# Patient Record
Sex: Female | Born: 1940
Health system: Southern US, Community
[De-identification: ages and names within clinical notes are randomized; demographics above are authoritative.]

## PROBLEM LIST (undated history)

## (undated) DIAGNOSIS — C801 Malignant (primary) neoplasm, unspecified: Secondary | ICD-10-CM

## (undated) DIAGNOSIS — I10 Essential (primary) hypertension: Secondary | ICD-10-CM

## (undated) DIAGNOSIS — I341 Nonrheumatic mitral (valve) prolapse: Secondary | ICD-10-CM

## (undated) DIAGNOSIS — Z923 Personal history of irradiation: Secondary | ICD-10-CM

## (undated) DIAGNOSIS — N200 Calculus of kidney: Secondary | ICD-10-CM

## (undated) DIAGNOSIS — Z9221 Personal history of antineoplastic chemotherapy: Secondary | ICD-10-CM

## (undated) HISTORY — PX: BREAST BIOPSY: SHX20

## (undated) HISTORY — PX: ABDOMINAL HYSTERECTOMY: SHX81

## (undated) HISTORY — PX: SHOULDER SURGERY: SHX246

## (undated) HISTORY — DX: Malignant (primary) neoplasm, unspecified: C80.1

## (undated) HISTORY — DX: Calculus of kidney: N20.0

## (undated) HISTORY — DX: Nonrheumatic mitral (valve) prolapse: I34.1

---

## 1984-07-21 HISTORY — PX: CERVICAL LAMINECTOMY: SHX94

## 1986-07-21 HISTORY — PX: OTHER SURGICAL HISTORY: SHX169

## 1998-07-21 HISTORY — PX: BREAST LUMPECTOMY: SHX2

## 1999-03-11 ENCOUNTER — Other Ambulatory Visit: Admission: RE | Admit: 1999-03-11 | Discharge: 1999-03-11 | Payer: Self-pay | Admitting: Radiology

## 1999-03-19 ENCOUNTER — Encounter: Admission: RE | Admit: 1999-03-19 | Discharge: 1999-06-17 | Payer: Self-pay | Admitting: Radiation Oncology

## 1999-03-20 ENCOUNTER — Ambulatory Visit (HOSPITAL_COMMUNITY): Admission: RE | Admit: 1999-03-20 | Discharge: 1999-03-21 | Payer: Self-pay | Admitting: Surgery

## 1999-04-08 ENCOUNTER — Observation Stay (HOSPITAL_COMMUNITY): Admission: RE | Admit: 1999-04-08 | Discharge: 1999-04-09 | Payer: Self-pay | Admitting: Surgery

## 1999-04-08 ENCOUNTER — Encounter (INDEPENDENT_AMBULATORY_CARE_PROVIDER_SITE_OTHER): Payer: Self-pay

## 1999-04-08 ENCOUNTER — Encounter: Payer: Self-pay | Admitting: Surgery

## 1999-05-02 ENCOUNTER — Ambulatory Visit (HOSPITAL_COMMUNITY): Admission: RE | Admit: 1999-05-02 | Discharge: 1999-05-02 | Payer: Self-pay | Admitting: *Deleted

## 1999-05-02 ENCOUNTER — Encounter: Payer: Self-pay | Admitting: *Deleted

## 1999-06-21 ENCOUNTER — Encounter: Payer: Self-pay | Admitting: *Deleted

## 1999-06-21 ENCOUNTER — Ambulatory Visit (HOSPITAL_COMMUNITY): Admission: RE | Admit: 1999-06-21 | Discharge: 1999-06-21 | Payer: Self-pay | Admitting: *Deleted

## 1999-07-16 ENCOUNTER — Encounter: Payer: Self-pay | Admitting: *Deleted

## 1999-07-16 ENCOUNTER — Ambulatory Visit (HOSPITAL_COMMUNITY): Admission: RE | Admit: 1999-07-16 | Discharge: 1999-07-16 | Payer: Self-pay | Admitting: *Deleted

## 1999-08-16 ENCOUNTER — Inpatient Hospital Stay (HOSPITAL_COMMUNITY): Admission: EM | Admit: 1999-08-16 | Discharge: 1999-08-18 | Payer: Self-pay | Admitting: Emergency Medicine

## 1999-08-17 ENCOUNTER — Encounter: Payer: Self-pay | Admitting: Emergency Medicine

## 1999-08-17 ENCOUNTER — Encounter: Payer: Self-pay | Admitting: Family Medicine

## 1999-09-20 ENCOUNTER — Encounter: Admission: RE | Admit: 1999-09-20 | Discharge: 1999-12-16 | Payer: Self-pay | Admitting: *Deleted

## 1999-10-29 ENCOUNTER — Encounter: Admission: RE | Admit: 1999-10-29 | Discharge: 2000-01-27 | Payer: Self-pay | Admitting: Radiation Oncology

## 1999-12-09 ENCOUNTER — Ambulatory Visit (HOSPITAL_COMMUNITY): Admission: RE | Admit: 1999-12-09 | Discharge: 1999-12-09 | Payer: Self-pay | Admitting: Surgery

## 1999-12-16 ENCOUNTER — Encounter: Admission: RE | Admit: 1999-12-16 | Discharge: 2000-03-15 | Payer: Self-pay | Admitting: *Deleted

## 2000-02-15 ENCOUNTER — Encounter: Payer: Self-pay | Admitting: Family Medicine

## 2000-02-15 ENCOUNTER — Ambulatory Visit (HOSPITAL_COMMUNITY): Admission: RE | Admit: 2000-02-15 | Discharge: 2000-02-15 | Payer: Self-pay | Admitting: Family Medicine

## 2000-03-16 ENCOUNTER — Ambulatory Visit (HOSPITAL_COMMUNITY): Admission: RE | Admit: 2000-03-16 | Discharge: 2000-03-16 | Payer: Self-pay | Admitting: *Deleted

## 2000-04-28 ENCOUNTER — Other Ambulatory Visit: Admission: RE | Admit: 2000-04-28 | Discharge: 2000-04-28 | Payer: Self-pay | Admitting: Obstetrics and Gynecology

## 2000-05-01 ENCOUNTER — Ambulatory Visit (HOSPITAL_COMMUNITY): Admission: RE | Admit: 2000-05-01 | Discharge: 2000-05-01 | Payer: Self-pay | Admitting: Family Medicine

## 2000-05-01 ENCOUNTER — Encounter: Payer: Self-pay | Admitting: Family Medicine

## 2000-12-29 ENCOUNTER — Encounter: Payer: Self-pay | Admitting: Family Medicine

## 2000-12-29 ENCOUNTER — Encounter: Admission: RE | Admit: 2000-12-29 | Discharge: 2000-12-29 | Payer: Self-pay | Admitting: Family Medicine

## 2001-04-05 ENCOUNTER — Ambulatory Visit (HOSPITAL_COMMUNITY): Admission: RE | Admit: 2001-04-05 | Discharge: 2001-04-05 | Payer: Self-pay | Admitting: Family Medicine

## 2001-04-05 ENCOUNTER — Encounter: Payer: Self-pay | Admitting: Family Medicine

## 2001-04-30 ENCOUNTER — Other Ambulatory Visit: Admission: RE | Admit: 2001-04-30 | Discharge: 2001-04-30 | Payer: Self-pay | Admitting: Obstetrics and Gynecology

## 2001-12-15 ENCOUNTER — Encounter: Payer: Self-pay | Admitting: Family Medicine

## 2001-12-15 ENCOUNTER — Ambulatory Visit (HOSPITAL_COMMUNITY): Admission: RE | Admit: 2001-12-15 | Discharge: 2001-12-15 | Payer: Self-pay | Admitting: Family Medicine

## 2002-01-11 ENCOUNTER — Other Ambulatory Visit: Admission: RE | Admit: 2002-01-11 | Discharge: 2002-01-11 | Payer: Self-pay | Admitting: Obstetrics and Gynecology

## 2002-11-24 ENCOUNTER — Encounter: Admission: RE | Admit: 2002-11-24 | Discharge: 2002-11-24 | Payer: Self-pay | Admitting: Oncology

## 2002-11-24 ENCOUNTER — Encounter: Payer: Self-pay | Admitting: Oncology

## 2003-05-17 ENCOUNTER — Ambulatory Visit (HOSPITAL_COMMUNITY): Admission: RE | Admit: 2003-05-17 | Discharge: 2003-05-17 | Payer: Self-pay | Admitting: Oncology

## 2003-07-24 ENCOUNTER — Encounter: Payer: Self-pay | Admitting: Family Medicine

## 2003-07-24 LAB — CONVERTED CEMR LAB

## 2003-11-28 ENCOUNTER — Encounter: Admission: RE | Admit: 2003-11-28 | Discharge: 2003-11-28 | Payer: Self-pay | Admitting: Oncology

## 2003-12-18 ENCOUNTER — Encounter: Admission: RE | Admit: 2003-12-18 | Discharge: 2003-12-18 | Payer: Self-pay | Admitting: Orthopedic Surgery

## 2004-11-18 ENCOUNTER — Ambulatory Visit: Payer: Self-pay | Admitting: Oncology

## 2004-11-28 ENCOUNTER — Encounter: Admission: RE | Admit: 2004-11-28 | Discharge: 2004-11-28 | Payer: Self-pay | Admitting: Oncology

## 2005-01-20 ENCOUNTER — Ambulatory Visit: Payer: Self-pay | Admitting: Oncology

## 2005-05-23 ENCOUNTER — Ambulatory Visit: Payer: Self-pay | Admitting: Family Medicine

## 2005-06-06 ENCOUNTER — Ambulatory Visit: Payer: Self-pay | Admitting: Family Medicine

## 2005-07-23 ENCOUNTER — Ambulatory Visit: Payer: Self-pay | Admitting: Family Medicine

## 2005-08-06 ENCOUNTER — Ambulatory Visit: Payer: Self-pay | Admitting: Family Medicine

## 2005-08-31 ENCOUNTER — Ambulatory Visit (HOSPITAL_COMMUNITY): Admission: RE | Admit: 2005-08-31 | Discharge: 2005-08-31 | Payer: Self-pay | Admitting: *Deleted

## 2005-09-03 ENCOUNTER — Ambulatory Visit: Payer: Self-pay | Admitting: Family Medicine

## 2005-11-19 ENCOUNTER — Ambulatory Visit: Payer: Self-pay | Admitting: Family Medicine

## 2005-11-26 ENCOUNTER — Ambulatory Visit: Payer: Self-pay | Admitting: Family Medicine

## 2005-12-03 ENCOUNTER — Encounter: Admission: RE | Admit: 2005-12-03 | Discharge: 2005-12-03 | Payer: Self-pay | Admitting: Oncology

## 2005-12-17 ENCOUNTER — Ambulatory Visit: Payer: Self-pay | Admitting: Family Medicine

## 2005-12-23 ENCOUNTER — Ambulatory Visit: Payer: Self-pay

## 2006-01-01 ENCOUNTER — Ambulatory Visit: Payer: Self-pay | Admitting: Family Medicine

## 2006-01-23 ENCOUNTER — Ambulatory Visit: Payer: Self-pay | Admitting: Oncology

## 2006-01-30 LAB — CBC WITH DIFFERENTIAL/PLATELET
BASO%: 0.3 % (ref 0.0–2.0)
Eosinophils Absolute: 0.1 10*3/uL (ref 0.0–0.5)
HCT: 38.6 % (ref 34.8–46.6)
MCHC: 34 g/dL (ref 32.0–36.0)
MONO#: 0.4 10*3/uL (ref 0.1–0.9)
NEUT#: 2.8 10*3/uL (ref 1.5–6.5)
RBC: 4.35 10*6/uL (ref 3.70–5.32)
WBC: 4.7 10*3/uL (ref 3.9–10.0)
lymph#: 1.4 10*3/uL (ref 0.9–3.3)

## 2006-01-30 LAB — COMPREHENSIVE METABOLIC PANEL
ALT: 14 U/L (ref 0–40)
Albumin: 4.3 g/dL (ref 3.5–5.2)
CO2: 28 mEq/L (ref 19–32)
Calcium: 8.9 mg/dL (ref 8.4–10.5)
Chloride: 104 mEq/L (ref 96–112)
Glucose, Bld: 101 mg/dL — ABNORMAL HIGH (ref 70–99)
Sodium: 142 mEq/L (ref 135–145)
Total Protein: 6.4 g/dL (ref 6.0–8.3)

## 2006-01-30 LAB — CANCER ANTIGEN 27.29: CA 27.29: 27 U/mL (ref 0–39)

## 2006-02-26 ENCOUNTER — Encounter (INDEPENDENT_AMBULATORY_CARE_PROVIDER_SITE_OTHER): Payer: Self-pay | Admitting: Specialist

## 2006-02-26 ENCOUNTER — Ambulatory Visit (HOSPITAL_BASED_OUTPATIENT_CLINIC_OR_DEPARTMENT_OTHER): Admission: RE | Admit: 2006-02-26 | Discharge: 2006-02-26 | Payer: Self-pay | Admitting: Orthopedic Surgery

## 2006-04-23 ENCOUNTER — Ambulatory Visit: Payer: Self-pay | Admitting: Family Medicine

## 2006-04-28 DIAGNOSIS — I059 Rheumatic mitral valve disease, unspecified: Secondary | ICD-10-CM | POA: Insufficient documentation

## 2006-04-28 DIAGNOSIS — I10 Essential (primary) hypertension: Secondary | ICD-10-CM

## 2006-04-28 DIAGNOSIS — E039 Hypothyroidism, unspecified: Secondary | ICD-10-CM

## 2006-05-26 ENCOUNTER — Ambulatory Visit: Payer: Self-pay | Admitting: Family Medicine

## 2006-06-05 ENCOUNTER — Encounter: Payer: Self-pay | Admitting: Family Medicine

## 2006-06-19 ENCOUNTER — Telehealth: Payer: Self-pay | Admitting: Family Medicine

## 2007-01-21 ENCOUNTER — Ambulatory Visit: Payer: Self-pay | Admitting: Oncology

## 2007-01-26 ENCOUNTER — Encounter: Admission: RE | Admit: 2007-01-26 | Discharge: 2007-01-26 | Payer: Self-pay | Admitting: Oncology

## 2007-01-26 LAB — COMPREHENSIVE METABOLIC PANEL
ALT: 13 U/L (ref 0–35)
AST: 23 U/L (ref 0–37)
Albumin: 4.5 g/dL (ref 3.5–5.2)
CO2: 26 mEq/L (ref 19–32)
Calcium: 9.5 mg/dL (ref 8.4–10.5)
Chloride: 105 mEq/L (ref 96–112)
Potassium: 3.9 mEq/L (ref 3.5–5.3)
Total Protein: 6.9 g/dL (ref 6.0–8.3)

## 2007-01-26 LAB — CBC WITH DIFFERENTIAL/PLATELET
BASO%: 0.4 % (ref 0.0–2.0)
EOS%: 2 % (ref 0.0–7.0)
HCT: 37.4 % (ref 34.8–46.6)
HGB: 13.1 g/dL (ref 11.6–15.9)
MCH: 30.3 pg (ref 26.0–34.0)
MCHC: 35 g/dL (ref 32.0–36.0)
MONO#: 0.6 10*3/uL (ref 0.1–0.9)
NEUT%: 52.7 % (ref 39.6–76.8)
RDW: 12.8 % (ref 11.3–14.5)
WBC: 4.9 10*3/uL (ref 3.9–10.0)
lymph#: 1.6 10*3/uL (ref 0.9–3.3)

## 2007-01-26 LAB — CANCER ANTIGEN 27.29: CA 27.29: 25 U/mL (ref 0–39)

## 2007-02-26 ENCOUNTER — Ambulatory Visit: Payer: Self-pay | Admitting: Family Medicine

## 2007-02-26 DIAGNOSIS — M949 Disorder of cartilage, unspecified: Secondary | ICD-10-CM

## 2007-02-26 DIAGNOSIS — M899 Disorder of bone, unspecified: Secondary | ICD-10-CM | POA: Insufficient documentation

## 2007-03-25 ENCOUNTER — Ambulatory Visit: Payer: Self-pay | Admitting: Family Medicine

## 2007-04-30 ENCOUNTER — Encounter: Payer: Self-pay | Admitting: Family Medicine

## 2007-04-30 LAB — HM COLONOSCOPY

## 2007-05-07 ENCOUNTER — Encounter: Payer: Self-pay | Admitting: Family Medicine

## 2007-05-19 ENCOUNTER — Ambulatory Visit: Payer: Self-pay | Admitting: Family Medicine

## 2007-05-28 ENCOUNTER — Ambulatory Visit: Payer: Self-pay | Admitting: Family Medicine

## 2007-05-28 ENCOUNTER — Encounter: Admission: RE | Admit: 2007-05-28 | Discharge: 2007-05-28 | Payer: Self-pay | Admitting: Family Medicine

## 2007-05-31 LAB — CONVERTED CEMR LAB
AST: 22 units/L (ref 0–37)
BUN: 20 mg/dL (ref 6–23)
Calcium: 9.8 mg/dL (ref 8.4–10.5)
Chloride: 104 meq/L (ref 96–112)
Cholesterol, target level: 200 mg/dL
Cholesterol: 194 mg/dL (ref 0–200)
Creatinine, Ser: 0.81 mg/dL (ref 0.40–1.20)
HDL: 52 mg/dL (ref >39)
Total Bilirubin: 0.7 mg/dL (ref 0.3–1.2)
Total CHOL/HDL Ratio: 3.7
VLDL: 17 mg/dL (ref 0–40)

## 2007-06-03 ENCOUNTER — Telehealth: Payer: Self-pay | Admitting: Family Medicine

## 2007-06-04 ENCOUNTER — Ambulatory Visit: Payer: Self-pay | Admitting: Family Medicine

## 2007-06-11 ENCOUNTER — Telehealth: Payer: Self-pay | Admitting: Family Medicine

## 2007-07-27 ENCOUNTER — Telehealth: Payer: Self-pay | Admitting: Family Medicine

## 2007-07-29 ENCOUNTER — Ambulatory Visit: Payer: Self-pay | Admitting: Family Medicine

## 2007-09-11 ENCOUNTER — Emergency Department (HOSPITAL_COMMUNITY): Admission: EM | Admit: 2007-09-11 | Discharge: 2007-09-11 | Payer: Self-pay | Admitting: Emergency Medicine

## 2008-01-05 ENCOUNTER — Encounter: Admission: RE | Admit: 2008-01-05 | Discharge: 2008-01-05 | Payer: Self-pay | Admitting: Family Medicine

## 2008-01-05 ENCOUNTER — Ambulatory Visit: Payer: Self-pay | Admitting: Family Medicine

## 2008-01-05 DIAGNOSIS — M25559 Pain in unspecified hip: Secondary | ICD-10-CM | POA: Insufficient documentation

## 2008-01-05 DIAGNOSIS — M25519 Pain in unspecified shoulder: Secondary | ICD-10-CM

## 2008-01-05 HISTORY — DX: Pain in unspecified hip: M25.559

## 2008-01-20 ENCOUNTER — Ambulatory Visit: Payer: Self-pay | Admitting: Family Medicine

## 2008-01-20 DIAGNOSIS — M79609 Pain in unspecified limb: Secondary | ICD-10-CM

## 2008-01-20 HISTORY — DX: Pain in unspecified limb: M79.609

## 2008-01-24 ENCOUNTER — Encounter: Payer: Self-pay | Admitting: Family Medicine

## 2008-01-24 ENCOUNTER — Ambulatory Visit: Payer: Self-pay

## 2008-01-24 LAB — CONVERTED CEMR LAB
Alkaline Phosphatase: 49 units/L (ref 39–117)
BUN: 22 mg/dL (ref 6–23)
Folate: >20 ng/mL
Glucose, Bld: 104 mg/dL — ABNORMAL HIGH (ref 70–99)
Hemoglobin: 13.3 g/dL (ref 12.0–15.0)
MCHC: 32.2 g/dL (ref 30.0–36.0)
MCV: 90 fL (ref 78.0–100.0)
RBC: 4.59 M/uL (ref 3.87–5.11)
TSH: 0.61 microintl units/mL (ref 0.350–4.50)
Total Bilirubin: 0.5 mg/dL (ref 0.3–1.2)
Vit D, 1,25-Dihydroxy: 30 (ref 30–89)
Vitamin B-12: 816 pg/mL (ref 211–911)

## 2008-01-26 ENCOUNTER — Encounter: Payer: Self-pay | Admitting: Family Medicine

## 2008-02-11 ENCOUNTER — Ambulatory Visit: Payer: Self-pay | Admitting: Oncology

## 2008-02-15 ENCOUNTER — Encounter: Payer: Self-pay | Admitting: Family Medicine

## 2008-02-15 LAB — COMPREHENSIVE METABOLIC PANEL
ALT: 19 U/L (ref 0–35)
CO2: 26 mEq/L (ref 19–32)
Calcium: 10 mg/dL (ref 8.4–10.5)
Chloride: 104 mEq/L (ref 96–112)
Creatinine, Ser: 0.84 mg/dL (ref 0.40–1.20)
Glucose, Bld: 104 mg/dL — ABNORMAL HIGH (ref 70–99)
Total Bilirubin: 0.7 mg/dL (ref 0.3–1.2)
Total Protein: 6.9 g/dL (ref 6.0–8.3)

## 2008-02-15 LAB — CBC WITH DIFFERENTIAL/PLATELET
Basophils Absolute: 0 10*3/uL (ref 0.0–0.1)
Eosinophils Absolute: 0.1 10*3/uL (ref 0.0–0.5)
HCT: 37.9 % (ref 34.8–46.6)
HGB: 12.9 g/dL (ref 11.6–15.9)
LYMPH%: 30.4 % (ref 14.0–48.0)
MCV: 89.5 fL (ref 81.0–101.0)
MONO#: 0.5 10*3/uL (ref 0.1–0.9)
MONO%: 9.4 % (ref 0.0–13.0)
NEUT#: 3.2 10*3/uL (ref 1.5–6.5)
NEUT%: 58.8 % (ref 39.6–76.8)
Platelets: 250 10*3/uL (ref 145–400)
RBC: 4.23 10*6/uL (ref 3.70–5.32)
WBC: 5.4 10*3/uL (ref 3.9–10.0)

## 2008-02-15 LAB — CANCER ANTIGEN 27.29: CA 27.29: 31 U/mL (ref 0–39)

## 2008-03-09 ENCOUNTER — Encounter: Admission: RE | Admit: 2008-03-09 | Discharge: 2008-03-09 | Payer: Self-pay | Admitting: Oncology

## 2008-03-30 ENCOUNTER — Ambulatory Visit (HOSPITAL_BASED_OUTPATIENT_CLINIC_OR_DEPARTMENT_OTHER): Admission: RE | Admit: 2008-03-30 | Discharge: 2008-03-30 | Payer: Self-pay | Admitting: Orthopedic Surgery

## 2008-04-27 ENCOUNTER — Ambulatory Visit: Payer: Self-pay | Admitting: Family Medicine

## 2008-11-29 ENCOUNTER — Telehealth (INDEPENDENT_AMBULATORY_CARE_PROVIDER_SITE_OTHER): Payer: Self-pay | Admitting: *Deleted

## 2008-12-11 ENCOUNTER — Telehealth: Payer: Self-pay | Admitting: Family Medicine

## 2008-12-11 DIAGNOSIS — R7301 Impaired fasting glucose: Secondary | ICD-10-CM | POA: Insufficient documentation

## 2008-12-15 ENCOUNTER — Encounter: Payer: Self-pay | Admitting: Family Medicine

## 2008-12-19 ENCOUNTER — Telehealth: Payer: Self-pay | Admitting: Family Medicine

## 2009-01-08 ENCOUNTER — Telehealth: Payer: Self-pay | Admitting: Family Medicine

## 2009-01-08 ENCOUNTER — Encounter: Payer: Self-pay | Admitting: Family Medicine

## 2009-02-09 ENCOUNTER — Ambulatory Visit: Payer: Self-pay | Admitting: Oncology

## 2009-02-14 ENCOUNTER — Encounter: Payer: Self-pay | Admitting: Family Medicine

## 2009-02-14 LAB — CBC WITH DIFFERENTIAL/PLATELET
EOS%: 1.5 % (ref 0.0–7.0)
Eosinophils Absolute: 0.1 10*3/uL (ref 0.0–0.5)
LYMPH%: 33.9 % (ref 14.0–49.7)
MCH: 30.1 pg (ref 25.1–34.0)
MCHC: 34 g/dL (ref 31.5–36.0)
MCV: 88.5 fL (ref 79.5–101.0)
MONO%: 8.6 % (ref 0.0–14.0)
Platelets: 265 10*3/uL (ref 145–400)
RBC: 4.32 10*6/uL (ref 3.70–5.45)
RDW: 13 % (ref 11.2–14.5)

## 2009-02-14 LAB — COMPREHENSIVE METABOLIC PANEL
AST: 27 U/L (ref 0–37)
Albumin: 4.1 g/dL (ref 3.5–5.2)
Alkaline Phosphatase: 57 U/L (ref 39–117)
Glucose, Bld: 118 mg/dL — ABNORMAL HIGH (ref 70–99)
Potassium: 3.7 mEq/L (ref 3.5–5.3)
Sodium: 139 mEq/L (ref 135–145)
Total Bilirubin: 0.8 mg/dL (ref 0.3–1.2)
Total Protein: 7.3 g/dL (ref 6.0–8.3)

## 2009-03-30 ENCOUNTER — Encounter: Admission: RE | Admit: 2009-03-30 | Discharge: 2009-03-30 | Payer: Self-pay | Admitting: Oncology

## 2009-04-18 ENCOUNTER — Encounter: Payer: Self-pay | Admitting: Family Medicine

## 2009-06-12 ENCOUNTER — Ambulatory Visit: Payer: Self-pay | Admitting: Family Medicine

## 2009-06-12 ENCOUNTER — Telehealth (INDEPENDENT_AMBULATORY_CARE_PROVIDER_SITE_OTHER): Payer: Self-pay | Admitting: *Deleted

## 2009-07-24 ENCOUNTER — Ambulatory Visit: Payer: Self-pay | Admitting: Family Medicine

## 2009-07-25 ENCOUNTER — Encounter: Payer: Self-pay | Admitting: Family Medicine

## 2009-07-25 LAB — CONVERTED CEMR LAB: Cholesterol: 198 mg/dL

## 2009-07-27 ENCOUNTER — Encounter: Payer: Self-pay | Admitting: Family Medicine

## 2009-07-30 ENCOUNTER — Telehealth: Payer: Self-pay | Admitting: Family Medicine

## 2009-08-07 ENCOUNTER — Telehealth: Payer: Self-pay | Admitting: Family Medicine

## 2009-08-10 ENCOUNTER — Ambulatory Visit: Payer: Self-pay | Admitting: Diagnostic Radiology

## 2009-08-10 ENCOUNTER — Ambulatory Visit (HOSPITAL_BASED_OUTPATIENT_CLINIC_OR_DEPARTMENT_OTHER): Admission: RE | Admit: 2009-08-10 | Discharge: 2009-08-10 | Payer: Self-pay | Admitting: Family Medicine

## 2009-12-28 ENCOUNTER — Ambulatory Visit: Payer: Self-pay | Admitting: Internal Medicine

## 2009-12-28 LAB — CONVERTED CEMR LAB
Nitrite: POSITIVE
Protein, U semiquant: NEGATIVE
pH: 6

## 2009-12-31 ENCOUNTER — Encounter: Payer: Self-pay | Admitting: Internal Medicine

## 2010-03-15 ENCOUNTER — Telehealth: Payer: Self-pay | Admitting: Family Medicine

## 2010-03-29 ENCOUNTER — Telehealth (INDEPENDENT_AMBULATORY_CARE_PROVIDER_SITE_OTHER): Payer: Self-pay | Admitting: *Deleted

## 2010-03-29 ENCOUNTER — Ambulatory Visit: Payer: Self-pay | Admitting: Family Medicine

## 2010-03-29 DIAGNOSIS — Z780 Asymptomatic menopausal state: Secondary | ICD-10-CM | POA: Insufficient documentation

## 2010-04-30 ENCOUNTER — Ambulatory Visit: Payer: Self-pay | Admitting: Family Medicine

## 2010-05-14 ENCOUNTER — Encounter: Admission: RE | Admit: 2010-05-14 | Discharge: 2010-05-14 | Payer: Self-pay | Admitting: Family Medicine

## 2010-07-05 ENCOUNTER — Ambulatory Visit: Payer: Self-pay | Admitting: Family Medicine

## 2010-07-05 DIAGNOSIS — N029 Recurrent and persistent hematuria with unspecified morphologic changes: Secondary | ICD-10-CM | POA: Insufficient documentation

## 2010-07-05 LAB — CONVERTED CEMR LAB
Bilirubin Urine: NEGATIVE
Glucose, Urine, Semiquant: NEGATIVE
Nitrite: NEGATIVE
Specific Gravity, Urine: 1.015

## 2010-07-06 ENCOUNTER — Encounter: Payer: Self-pay | Admitting: Family Medicine

## 2010-08-07 ENCOUNTER — Telehealth: Payer: Self-pay | Admitting: Family Medicine

## 2010-08-10 ENCOUNTER — Encounter: Payer: Self-pay | Admitting: Family Medicine

## 2010-08-11 ENCOUNTER — Encounter: Payer: Self-pay | Admitting: Family Medicine

## 2010-08-15 ENCOUNTER — Ambulatory Visit
Admission: RE | Admit: 2010-08-15 | Discharge: 2010-08-15 | Payer: Self-pay | Source: Home / Self Care | Attending: Family Medicine | Admitting: Family Medicine

## 2010-08-15 ENCOUNTER — Encounter
Admission: RE | Admit: 2010-08-15 | Discharge: 2010-08-15 | Payer: Self-pay | Source: Home / Self Care | Attending: Family Medicine | Admitting: Family Medicine

## 2010-08-15 DIAGNOSIS — G5702 Lesion of sciatic nerve, left lower limb: Secondary | ICD-10-CM | POA: Insufficient documentation

## 2010-08-20 ENCOUNTER — Encounter: Payer: Self-pay | Admitting: Family Medicine

## 2010-08-20 NOTE — Assessment & Plan Note (Signed)
Summary: BPV   Vital Signs:  Patient profile:   70 year old female Height:      62.5 inches Weight:      134 pounds Pulse rate:   86 / minute BP sitting:   156 / 81  (left arm) Cuff size:   regular  Vitals Entered By: Avon Gully CMA, Duncan Dull) (April 30, 2010 3:36 PM) CC: dizziness since 1 pm, nausea    Serial Vital Signs/Assessments:  Time      Position  BP       Pulse  Resp  Temp     By 3:57 PM             146/73                         Avon Gully CMA, Duncan Dull)   Primary Care Provider:  Nani Gasser MD  CC:  dizziness since 1 pm and nausea .  History of Present Illness: REally dizzy. started around noon today. NO known triggers.   Hard to focus her eyes. Has had similar sxs in teh past when stands up or bends over but usuallly it was pass but not this time. Has had breifly before. Worse with movement. Feels cold this afternoon. No fever or recent URI. No ear pain or ringing. Does get very nauseated with it but no vomiting.  Hasn't eaten this afternoon.   Current Medications (verified): 1)  Calcium-Vitamin D 250-125 Mg-Unit Tabs (Calcium Carbonate-Vitamin D) .... One Tab Two Times A Day 2)  Benicar Hct 20-12.5 Mg Tabs (Olmesartan Medoxomil-Hctz) .... Take 1 Tablet By Mouth Once A Day 3)  Synthroid 75 Mcg  Tabs (Levothyroxine Sodium) .... Take 1 Tablet By Mouth Once A Day, Except On The Weekend Take 1/2 Tab Daily 4)  Vitamin C 100 Mg Tabs (Ascorbic Acid) 5)  Fish Oil   Oil (Fish Oil) .... Take One Tablet By Mouth Once A Day 6)  Vitamin D 2000 Unit Tabs (Cholecalciferol) .... Take One Tablet By Mouth Once A Day 7)  B Complex 100  Tabs (B Complex Vitamins) .... Take One Tablet By Mouth Once A Day  Allergies (verified): 1)  ! Sulfa 2)  ! Codeine 3)  ! Penicillin 4)  ! Streptomycin  Comments:  Nurse/Medical Assistant: The patient's medications and allergies were reviewed with the patient and were updated in the Medication and Allergy Lists. Avon Gully  CMA, Duncan Dull) (April 30, 2010 4:04 PM)  Past History:  Past Medical History: Last updated: 03/29/2010 Hepres Simplex of Left Eye  Hx Kidney stones  Hx R Br infilt ductal carcinoma, prev on Tamoxifen  meds:Arimidex 1mg  one daily  MVP-Asymptomatic   Physical Exam  General:  Well-developed,well-nourished,in no acute distress; alert,appropriate and cooperative throughout examination. Sitting very still.  Head:  Normocephalic and atraumatic without obvious abnormalities. No apparent alopecia or balding. Eyes:  No corneal or conjunctival inflammation noted. EOMI. Perrla. Funduscopic exam benign, without hemorrhages, exudates or papilledema. Vision grossly normal. Ears:  Blocked by cerumen bilat. Unable to view TMs.  Nose:  no external deformity.   Mouth:  Oral mucosa and oropharynx without lesions or exudates.  Teeth in good repair. Neck:  No deformities, masses, or tenderness noted. Lungs:  Normal respiratory effort, chest expands symmetrically. Lungs are clear to auscultation, no crackles or wheezes. Heart:  Normal rate and regular rhythm. S1 and S2 normal without gallop, murmur, click, rub or other extra sounds. Neurologic:  alert & oriented X3  and cranial nerves II-XII intact.  + diks-Hallpike to the right.   Skin:  no rashes.   Cervical Nodes:  No lymphadenopathy noted Psych:  Cognition and judgment appear intact. Alert and cooperative with normal attention span and concentration. No apparent delusions, illusions, hallucinations   Impression & Recommendations:  Problem # 1:  BENIGN POSITIONAL VERTIGO (ICD-386.11)  Explained most likely dx based on her hx and exam.  Demonstrated maneuvers to self-treat vertigo. Trial of meclzine to improve her sxs. Patient to call to be seen if no improvement in 10-14 days, sooner if worse.   Orders: Prescription Created Electronically [phone])  Problem # 2:  HYPERTENSION, BENIGN SYSTEMIC (ICD-401.1) BP up today but this is very unusual for her.   She is usu well controlled on her regimen. LIkey elevated because of anxiousness and nausea with her vertigo  Repeat BP was better so will montior.   Her updated medication list for this problem includes:    Benicar Hct 20-12.5 Mg Tabs (Olmesartan medoxomil-hctz) .Marland Kitchen... Take 1 tablet by mouth once a day  Complete Medication List: 1)  Calcium-vitamin D 250-125 Mg-unit Tabs (Calcium carbonate-vitamin d) .... One tab two times a day 2)  Benicar Hct 20-12.5 Mg Tabs (Olmesartan medoxomil-hctz) .... Take 1 tablet by mouth once a day 3)  Synthroid 75 Mcg Tabs (Levothyroxine sodium) .... Take 1 tablet by mouth once a day, except on the weekend take 1/2 tab daily 4)  Vitamin C 100 Mg Tabs (Ascorbic acid) 5)  Fish Oil Oil (Fish oil) .... Take one tablet by mouth once a day 6)  Vitamin D 2000 Unit Tabs (Cholecalciferol) .... Take one tablet by mouth once a day 7)  B Complex 100 Tabs (B complex vitamins) .... Take one tablet by mouth once a day  Patient Instructions: 1)  Meclizine 25mg -Take once a day as box recommends for vertigo 2)  Can start the exercises 10 set - 4 x a day 3)  If not better by Monday please call the office.

## 2010-08-20 NOTE — Assessment & Plan Note (Signed)
Summary: KIDNEY INF//[facility]--Rm 3   Vital Signs:  Patient profile:   70 year old female Height:      62.5 inches Weight:      134.75 pounds BMI:     24.34 O2 Sat:      100 % on Room air Temp:     97.8 degrees F oral Pulse rate:   68 / minute Pulse rhythm:   regular Resp:     16 per minute BP sitting:   124 / 64  (right arm) Cuff size:   regular  Vitals Entered By: Mervin Kung CMA (December 28, 2009 10:55 AM)  O2 Flow:  Room air   Primary Care Provider:  Nani Gasser MD   History of Present Illness: 70 y/o female c/o pain across lower back, urinary frequency and odor to urine since last week.  denies fever, chills, back pain  Allergies: 1)  ! Sulfa 2)  ! Codeine 3)  ! Penicillin 4)  ! Streptomycin  Past History:  Past Medical History: Hepres Simples of Left Eye  Hx Kidney stones  Hx R Br infilt ductal carcinoma, prev on Tamoxifen  meds:Arimidex 1mg  one daily  MVP-Asymptomatic   Past Surgical History: Cervical Laminectomy  1986  Hysterectomy - Partial  Lumpectomy Right Breast   Stress Test by Dr. Chancy Hurter, Stress Test by Dr.Jordan([facility]) 1998 Thyroidectomy for CA 1988 PMH reviewed for relevance  Family History: Mother BrCa   Social History: Works at [employer] at [employer].  12 yrs education.  Married to [employer].  1 adult child.  Never smoke, no EtOH, 2 caffeinated drinks, No regular exercise.   Physical Exam  General:  alert, well-developed, and well-nourished.   Lungs:  normal respiratory effort and normal breath sounds.   Heart:  normal rate, regular rhythm, and no gallop.   Abdomen:  soft, no flank tenderness   Impression & Recommendations:  Problem # 1:  UTI (ICD-599.0) Encouraged to push clear liquids, get enough rest, and take acetaminophen as needed. To be seen in 10 days if no improvement, sooner if worse. The following medications were removed from the medication list:    Metronidazole 500 Mg Tabs (Metronidazole)  .Marland Kitchen... Take 1 tablet by mouth three times a day for 7 days    Ciprofloxacin Hcl 500 Mg Tabs (Ciprofloxacin hcl) .Marland Kitchen... Take 1 tablet by mouth two times a day for 7 days Her updated medication list for this problem includes:    Ciprofloxacin Hcl 500 Mg Tabs (Ciprofloxacin hcl) ..... One by mouth bid  Orders: T-Culture, Urine ([id number])  Complete Medication List: 1)  Calcium-vitamin D 250-125 Mg-unit Tabs (Calcium carbonate-vitamin d) .... One tab two times a day 2)  Losartan Potassium-hctz 50-12.5 Mg Tabs (Losartan potassium-hctz) .... Take 1 tablet by mouth once a day 3)  Synthroid 75 Mcg Tabs (Levothyroxine sodium) .... Take 1 tablet by mouth once a day, except on the weekend take 1/2 tab daily 4)  Vitamin C 100 Mg Tabs (Ascorbic acid) 5)  Fish Oil Oil (Fish oil) .... Take one tablet by mouth once a day 6)  Vitamin D 2000 Unit Tabs (Cholecalciferol) .... Take one tablet by mouth once a day 7)  B Complex 100 Tabs (B complex vitamins) .... Take one tablet by mouth once a day 8)  Ciprofloxacin Hcl 500 Mg Tabs (Ciprofloxacin hcl) .... One by mouth bid  Other Orders: UA Dipstick w/o Micro (manual) (09811)  Patient Instructions: 1)  Call our office if your symptoms do not  improve or gets worse. Prescriptions: CIPROFLOXACIN HCL 500 MG TABS (CIPROFLOXACIN HCL) one by mouth bid  #14 x 0   Entered and Authorized by:   D. Thomos Lemons DO   Signed by:   D. Thomos Lemons DO on 12/28/2009   Method used:   Electronically to        [facility]* (retail)       [address]       [place], [place]  [address]       Ph: [phone]       Fax: [phone]   RxID:   [id number]   Laboratory Results   Urine Tests    Routine Urinalysis   Color: yellow Appearance: Clear Glucose: negative   (Normal Range: Negative) Bilirubin: negative   (Normal Range: Negative) Ketone: negative   (Normal Range: Negative) Spec. Gravity: 1.010   (Normal Range:  1.003-1.035) Blood: moderate   (Normal Range: Negative) pH: 6.0   (Normal Range: 5.0-8.0) Protein: negative   (Normal Range: Negative) Urobilinogen: 0.2   (Normal Range: 0-1) Nitrite: positive   (Normal Range: Negative) Leukocyte Esterace: trace   (Normal Range: Negative)

## 2010-08-20 NOTE — Progress Notes (Signed)
Summary: Meds  Phone Note Call from Patient   Caller: Patient Call For: Nani Gasser MD Summary of Call: We recieved a refill request for Benicar from Pharm, however this med was not on the med list and losartan was.Pt states she was put on Losartan but it was too expensive and wanted something eles called in. Benicar was called in and it ended up being the same price so pt cont taking the Benicar and never took the Losartan.The pharm actually filled both rx's but Benicar is what the pt has been taking not Losartan.  Initial call taken by: Avon Gully CMA, Duncan Dull),  March 15, 2010 11:48 AM  Follow-up for Phone Call        OK, changed back. Needs f/u for BP and thyroid this fall.  Follow-up by: Nani Gasser MD,  March 15, 2010 12:28 PM  Additional Follow-up for Phone Call Additional follow up Details #1::        Pt notified med sent to pharmacy Additional Follow-up by: Kathlene November,  March 15, 2010 12:49 PM    New/Updated Medications: BENICAR HCT 20-12.5 MG TABS (OLMESARTAN MEDOXOMIL-HCTZ) Take 1 tablet by mouth once a day Prescriptions: BENICAR HCT 20-12.5 MG TABS (OLMESARTAN MEDOXOMIL-HCTZ) Take 1 tablet by mouth once a day  #30 x 4   Entered and Authorized by:   Nani Gasser MD   Signed by:   Nani Gasser MD on 03/15/2010   Method used:   Electronically to        [facility] (retail)       [address]       [place], [place]  [address]       Ph: [phone]       Fax: [phone]   RxID:   [id number]

## 2010-08-20 NOTE — Progress Notes (Signed)
----   Converted from flag ---- ---- 03/29/2010 1:28 PM, Nani Gasser MD wrote: Will you call the pateient and let her know that we couldn't find on in her paper char or her old records adn ask her to schedule any time for Tdap for nurse visit.   ---- 03/29/2010 1:26 PM, Kathlene November wrote: Looked in her chart- she has not had one- I went all way back to 1998. The only thing we had given her was the flu shot  ---- 03/29/2010 12:43 PM, Nani Gasser MD wrote: Will you pull her paper chart and check to see if she has had a Tdap and let me know. If so let me know date. Thank you. ------------------------------ 03/29/2010 @ 1:55pm- Called pt and left VM of above and instructed to call office back to schedule the nurse visit. KJ LPN

## 2010-08-20 NOTE — Assessment & Plan Note (Signed)
Summary: CPE   Vital Signs:  Patient profile:   70 year old female Height:      62.5 inches Weight:      132 pounds Pulse rate:   106 / minute BP sitting:   127 / 68  (left arm) Cuff size:   regular  Vitals Entered By: Avon Gully CMA, Duncan Dull) (March 29, 2010 10:32 AM) CC: CPE,no pap   CC:  CPE and no pap.  Current Medications (verified): 1)  Calcium-Vitamin D 250-125 Mg-Unit Tabs (Calcium Carbonate-Vitamin D) .... One Tab Two Times A Day 2)  Benicar Hct 20-12.5 Mg Tabs (Olmesartan Medoxomil-Hctz) .... Take 1 Tablet By Mouth Once A Day 3)  Synthroid 75 Mcg  Tabs (Levothyroxine Sodium) .... Take 1 Tablet By Mouth Once A Day, Except On The Weekend Take 1/2 Tab Daily 4)  Vitamin C 100 Mg Tabs (Ascorbic Acid) 5)  Fish Oil   Oil (Fish Oil) .... Take One Tablet By Mouth Once A Day 6)  Vitamin D 2000 Unit Tabs (Cholecalciferol) .... Take One Tablet By Mouth Once A Day 7)  B Complex 100  Tabs (B Complex Vitamins) .... Take One Tablet By Mouth Once A Day  Allergies (verified): 1)  ! Sulfa 2)  ! Codeine 3)  ! Penicillin 4)  ! Streptomycin  Comments:  Nurse/Medical Assistant: The patient's medications and allergies were reviewed with the patient and were updated in the Medication and Allergy Lists. Avon Gully CMA, Duncan Dull) (March 29, 2010 10:33 AM)  Past History:  Family History: Last updated: 12/28/2009 Mother BrCa   Social History: Last updated: 12/28/2009 Works at [employer] at [employer].  12 yrs education.  Married to [employer].  1 adult child.  Never smoke, no EtOH, 2 caffeinated drinks, No regular exercise.   Past Medical History: Hepres Simplex of Left Eye  Hx Kidney stones  Hx R Br infilt ductal carcinoma, prev on Tamoxifen  meds:Arimidex 1mg  one daily  MVP-Asymptomatic   Past Surgical History: Reviewed history from 12/28/2009 and no changes required. Cervical Laminectomy  1986  Hysterectomy - Partial  Lumpectomy Right Breast   Stress Test by Dr. Chancy Hurter, Stress Test by Dr.Jordan([facility]) 1998 Thyroidectomy for CA 1988  Family History: Reviewed history from 12/28/2009 and no changes required. Mother BrCa   Review of Systems  The patient denies anorexia, fever, weight loss, weight gain, vision loss, decreased hearing, hoarseness, chest pain, syncope, dyspnea on exertion, peripheral edema, prolonged cough, headaches, hemoptysis, abdominal pain, melena, hematochezia, severe indigestion/heartburn, hematuria, incontinence, genital sores, muscle weakness, suspicious skin lesions, transient blindness, difficulty walking, depression, unusual weight change, abnormal bleeding, enlarged lymph nodes, and breast masses.    Physical Exam  General:  Well-developed,well-nourished,in no acute distress; alert,appropriate and cooperative throughout examination Head:  Normocephalic and atraumatic without obvious abnormalities. No apparent alopecia or balding. Eyes:  No corneal or conjunctival inflammation noted. EOMI. Perrla.  Ears:  External ear exam shows no significant lesions or deformities.  Otoscopic examination reveals clear canals, tympanic membranes are intact bilaterally without bulging, retraction, inflammation or discharge. Hearing is grossly normal bilaterally. Nose:  External nasal examination shows no deformity or inflammation.  Mouth:  Oral mucosa and oropharynx without lesions or exudates.  Teeth in good repair. Neck:  No deformities, masses, or tenderness noted. Chest Wall:  No deformities, masses, or tenderness noted. Breasts:  No mass, nodules, thickening, tenderness, bulging, retraction, inflamation, nipple discharge or skin changes noted.  Scars ell healed.  Lungs:  Normal respiratory effort,  chest expands symmetrically. Lungs are clear to auscultation, no crackles or wheezes. Heart:  Normal rate and regular rhythm. S1 and S2 normal without gallop, murmur, click, rub or other extra sounds. Abdomen:  Bowel sounds  positive,abdomen soft and non-tender without masses, organomegaly or hernias noted. Msk:  No deformity or scoliosis noted of thoracic or lumbar spine.   Pulses:  R and L carotid,radial,dorsalis pedis and posterior tibial pulses are full and equal bilaterally Extremities:  No clubbing, cyanosis, edema, or deformity noted with normal full range of motion of all joints.   Neurologic:  No cranial nerve deficits noted. Station and gait are normal. Sensory, motor and coordinative functions appear intact. Skin:  no rashes.   Cervical Nodes:  No lymphadenopathy noted Axillary Nodes:  No palpable lymphadenopathy Psych:  Cognition and judgment appear intact. Alert and cooperative with normal attention span and concentration. No apparent delusions, illusions, hallucinations   Impression & Recommendations:  Problem # 1:  HEALTH MAINTENANCE EXAM (ICD-V70.0)  Exam is normal today Due for screenign labs.  Flu vac and shingles vac given today.  Reminded to schedule her pap. She declined to do it today. Reminded her to get her mammogar nd DEXA. She is due this fall.   Orders: T-Comprehensive Metabolic Panel [phone]) T-Lipid Profile [phone])  Complete Medication List: 1)  Calcium-vitamin D 250-125 Mg-unit Tabs (Calcium carbonate-vitamin d) .... One tab two times a day 2)  Benicar Hct 20-12.5 Mg Tabs (Olmesartan medoxomil-hctz) .... Take 1 tablet by mouth once a day 3)  Synthroid 75 Mcg Tabs (Levothyroxine sodium) .... Take 1 tablet by mouth once a day, except on the weekend take 1/2 tab daily 4)  Vitamin C 100 Mg Tabs (Ascorbic acid) 5)  Fish Oil Oil (Fish oil) .... Take one tablet by mouth once a day 6)  Vitamin D 2000 Unit Tabs (Cholecalciferol) .... Take one tablet by mouth once a day 7)  B Complex 100 Tabs (B complex vitamins) .... Take one tablet by mouth once a day  Other Orders: T-TSH [phone]) T-Dual DXA Bone Density/ Axial ([id number]) Zoster (Shingles) Vaccine Live  [phone]) Admin 1st Vaccine (28413) Flu Vaccine 96yrs + (24401) Admin of Any Addtl Vaccine (02725)  Patient Instructions: 1)  We will call you with your labs next week.  2)  i will see if I cna find your last tetanus shot. 3)  You were shingles and flu vaccines today.   4)  Schedule your pap smear with me anytime in the next month or two. 5)  Call [phone] to schedule your bone density test or you can have done with your mammogram.     Immunizations Administered:  Zostavax # 1:    Vaccine Type: Zostavax    Site: rt lower deltoid    Mfr: Merck    Dose: 0.5 ml    Route: IM    Given by: Sue Lush McCrimmon CMA, (AAMA)    Exp. Date: 02/22/2011    Lot #: [id number]    VIS given: 05/02/05 given March 29, 2010.  Influenza Vaccine # 1:    Vaccine Type: Fluvax 3+    Site: rt upper deltoid    Mfr: fluarix    Dose: 0.5 ml    Route: IM    Given by: Sue Lush McCrimmon CMA, (AAMA)    Exp. Date: 01/18/2011    Lot #: [id number]    VIS given: 02/12/10 version given March 29, 2010.  Flu Vaccine Consent Questions:    Do you have a  history of severe allergic reactions to this vaccine? no    Any prior history of allergic reactions to egg and/or gelatin? no    Do you have a sensitivity to the preservative Thimersol? no    Do you have a past history of Guillan-Barre Syndrome? no    Do you currently have an acute febrile illness? no    Have you ever had a severe reaction to latex? no    Vaccine information given and explained to patient? no    Are you currently pregnant? no   Appended Document: CPE Call pt: labs ok, excpet LDL is 131. goal is < 130.  Lookeed better last time. Make sure getting regular exercise. September 14, 20112:11 PM [place] MD, Santina Evans  04/03/2010 @ 2:13pm- Pt notified of results and instructions. KJ LPN   Lipid Management History:      Positive NCEP/ATP III risk factors include female age 82 years old or older and hypertension.  Negative NCEP/ATP III risk factors  include no history of early menopause without estrogen hormone replacement, non-diabetic, no family history for ischemic heart disease, no ASHD (atherosclerotic heart disease), no prior stroke/TIA, no peripheral vascular disease, and no history of aortic aneurysm.     Lipid Assessment/Plan:      Based on NCEP/ATP III, the patient's risk factor category is "0-1 risk factors".  The patient's lipid goals are as follows: Total cholesterol goal is 200; LDL cholesterol goal is 130; HDL cholesterol goal is 40; Triglyceride goal is 150.  Her LDL cholesterol goal has been met.     Appended Document: CPE

## 2010-08-20 NOTE — Letter (Signed)
Summary: [facility]  [facility]   Imported By: Lanelle Bal 04/05/2009 11:27:11  _____________________________________________________________________  External Attachment:    Type:   Image     Comment:   External Document

## 2010-08-20 NOTE — Assessment & Plan Note (Signed)
Summary: Diarrhea   Vital Signs:  Patient profile:   70 year old female Height:      62.5 inches Weight:      135 pounds Temp:     98.4 degrees F oral Pulse rate:   83 / minute BP sitting:   110 / 61  (left arm) Cuff size:   regular  Vitals Entered By: Kathlene November (July 24, 2009 3:08 PM) CC: diarrhea since Thursday- Saturday had bodyaches and abdominal cramping whe easts or drinks   Primary Care Provider:  Nani Gasser MD  CC:  diarrhea since Thursday- Saturday had bodyaches and abdominal cramping whe easts or drinks.  History of Present Illness: diarrhea since Thursday- Saturday had bodyaches and abdominal cramping whe easts or drinks. BM 1-5 x a day.  Mostly mucous. Occ abdominal cramping but not consistant. Sometimes happens after eating. Felt achey over the weekend.  + chills.  Had diarrhea for a month about 40 years ago. Had a colonoscopy in 2008 that showed diverticulosis. See scanned note. Pain is radiating into her low back. Has seen a little pink tinge with BM today. No bright red blood.   Current Medications (verified): 1)  Calcium-Vitamin D 250-125 Mg-Unit Tabs (Calcium Carbonate-Vitamin D) .... One Tab Two Times A Day 2)  Losartan Potassium-Hctz 50-12.5 Mg Tabs (Losartan Potassium-Hctz) .... Take 1 Tablet By Mouth Once A Day 3)  Synthroid 75 Mcg  Tabs (Levothyroxine Sodium) .... Take 1 Tablet By Mouth Once A Day, Except On The Weekend Take 1/2 Tab Daily 4)  Vitamin C 100 Mg Tabs (Ascorbic Acid) 5)  Fish Oil   Oil (Fish Oil) .... Take One Tablet By Mouth Once A Day 6)  Vitamin D 2000 Unit Tabs (Cholecalciferol) .... Take One Tablet By Mouth Once A Day 7)  B Complex 100  Tabs (B Complex Vitamins) .... Take One Tablet By Mouth Once A Day  Allergies (verified): 1)  ! Sulfa 2)  ! Codeine 3)  ! Penicillin 4)  ! Streptomycin  Comments:  Nurse/Medical Assistant: The patient's medications and allergies were reviewed with the patient and were updated in the  Medication and Allergy Lists. Kathlene November (July 24, 2009 3:09 PM)  Past History:  Past Medical History: Last updated: 01/05/2008 Hepres Simples of Left Eye  Hx Kidney stones  Hx R Br infilt ductal carcinoma, prev on Tamoxifen  meds:Arimidex 1mg  one daily  MVP-Asymptomatic  Family History: Reviewed history from 04/28/2006 and no changes required. Mother BrCa  Physical Exam  General:  Well-developed,well-nourished,in no acute distress; alert,appropriate and cooperative throughout examination Head:  Normocephalic and atraumatic without obvious abnormalities. No apparent alopecia or balding. Eyes:  No corneal or conjunctival inflammation noted. EOMI. Perrla.  Abdomen:  soft.  INcreased BS with tenderness suprapubically.     Impression & Recommendations:  Problem # 1:  DIARRHEA (ICD-787.91)  Suspect diverticulitis or colitis. Will treat with metronidazole ande cipro. Call if gets worse or if not better in one week.  Will get stool culture as well. She says she will take it to [facility]. Reprinted her lab slip from November.   Orders: T-Culture, Stool ([id number])  Complete Medication List: 1)  Calcium-vitamin D 250-125 Mg-unit Tabs (Calcium carbonate-vitamin d) .... One tab two times a day 2)  Losartan Potassium-hctz 50-12.5 Mg Tabs (Losartan potassium-hctz) .... Take 1 tablet by mouth once a day 3)  Synthroid 75 Mcg Tabs (Levothyroxine sodium) .... Take 1 tablet by mouth once a day, except on the weekend take  1/2 tab daily 4)  Vitamin C 100 Mg Tabs (Ascorbic acid) 5)  Fish Oil Oil (Fish oil) .... Take one tablet by mouth once a day 6)  Vitamin D 2000 Unit Tabs (Cholecalciferol) .... Take one tablet by mouth once a day 7)  B Complex 100 Tabs (B complex vitamins) .... Take one tablet by mouth once a day 8)  Metronidazole 500 Mg Tabs (Metronidazole) .... Take 1 tablet by mouth three times a day for 7 days 9)  Ciprofloxacin Hcl 500 Mg Tabs (Ciprofloxacin hcl) .... Take 1  tablet by mouth two times a day for 7 days Prescriptions: CIPROFLOXACIN HCL 500 MG TABS (CIPROFLOXACIN HCL) Take 1 tablet by mouth two times a day for 7 days  #14 x 0   Entered and Authorized by:   Nani Gasser MD   Signed by:   Nani Gasser MD on 07/24/2009   Method used:   Electronically to        [facility]* (retail)       [address]       [place], [place]  [address]       Ph: [phone]       Fax: [phone]   RxID:   [id number] METRONIDAZOLE 500 MG TABS (METRONIDAZOLE) Take 1 tablet by mouth three times a day for 7 days  #21 x 0   Entered and Authorized by:   Nani Gasser MD   Signed by:   Nani Gasser MD on 07/24/2009   Method used:   Electronically to        [facility]* (retail)       [address]       [place], [place]  [address]       Ph: [phone]       Fax: [phone]   RxID:   [id number]

## 2010-08-20 NOTE — Letter (Signed)
Summary: Generic Letter  [facility]  [address]   [place], [place] [address]   Phone: [phone]  Fax: [phone]    04/30/2010  To Whom It May Concern, regardingJOANY [name]  [address] [place], [place]  [address]  [name] was here for a complete physical on 03-29-2010.      Sincerely,   Nani Gasser MD

## 2010-08-20 NOTE — Progress Notes (Signed)
Summary: Diarrhea  Phone Note Call from Patient Call back at Work Phone [phone]   Summary of Call: Patient left message today stating that she is still having problems with diarrhea. Patient would like to know the next step, does she need an prescription, and what should she be doing in regards to diet (high fiber, etc). Please advise. Initial call taken by: Lucious Groves,  August 07, 2009 11:53 AM  Follow-up for Phone Call        Lets set up for abdominal CT for possible diverticulits.  Follow-up by: Nani Gasser MD,  August 07, 2009 12:06 PM  Additional Follow-up for Phone Call Additional follow up Details #1::        Pt notified of above and that referral for CT was sent to referral coordinator adn they would be giving her a call.  Additional Follow-up by: Kathlene November,  August 08, 2009 9:57 AM

## 2010-08-20 NOTE — Progress Notes (Signed)
Summary: Neg stool cx  Phone Note Outgoing Call   Summary of Call: Call EA:VWUJW cultures are neg. Is she feeling any better?  Initial call taken by: Nani Gasser MD,  July 30, 2009 12:19 PM  Follow-up for Phone Call        Pt notified of results and MD instructions. Told to call back if not feeling better.  Follow-up by: Kathlene November,  July 30, 2009 12:27 PM

## 2010-08-22 NOTE — Progress Notes (Signed)
Summary: OTC meds  Phone Note Call from Patient Call back at Home Phone [phone]   Caller: Patient Reason for Call: Talk to Nurse Summary of Call: Pt would like to know if it would be safe for her to take OTC meds Osteo BiFlex & Move Free  Initial call taken by: Lannette Donath,  August 07, 2010 4:11 PM  Follow-up for Phone Call        Yes, can safely take these.  Follow-up by: Nani Gasser MD,  August 08, 2010 8:35 AM  Additional Follow-up for Phone Call Additional follow up Details #1::        left message with info on vm Additional Follow-up by: Avon Gully CMA, Duncan Dull),  August 08, 2010 10:10 AM

## 2010-08-22 NOTE — Assessment & Plan Note (Signed)
Summary:  left sciatica   Vital Signs:  Patient profile:   70 year old female Height:      62.5 inches Weight:      135 pounds Pulse rate:   76 / minute BP sitting:   150 / 81  (right arm) Cuff size:   regular  Vitals Entered By: Avon Gully CMA, Duncan Dull) (August 15, 2010 11:04 AM) CC: left hip and leg pain   Primary Care Provider:  Nani Gasser MD  CC:  left hip and leg pain.  History of Present Illness: Left low back pain radiating to her outer hip and towards her anterior knee. Has been there for a couple of month. Worse when has beeen sitting for awhile and then gets up. Using Tyelnol and Advil. she has been alternating me.Hleps some but not completely.  Left foot feels occ numbn. No weakness.  no real alleviating symptoms.  She feels this is somewhat related to the chemotherapy and the arimidex she took years ago.   Current Medications (verified): 1)  Calcium-Vitamin D 250-125 Mg-Unit Tabs (Calcium Carbonate-Vitamin D) .... One Tab Two Times A Day 2)  Benicar Hct 20-12.5 Mg Tabs (Olmesartan Medoxomil-Hctz) .... Take 1 Tablet By Mouth Once A Day 3)  Synthroid 75 Mcg  Tabs (Levothyroxine Sodium) .... Take 1 Tablet By Mouth Once A Day, Except On The Weekend Take 1/2 Tab Daily 4)  Vitamin C 100 Mg Tabs (Ascorbic Acid) 5)  Fish Oil   Oil (Fish Oil) .... Take One Tablet By Mouth Once A Day 6)  Vitamin D 2000 Unit Tabs (Cholecalciferol) .... Take One Tablet By Mouth Once A Day 7)  B Complex 100  Tabs (B Complex Vitamins) .... Take One Tablet By Mouth Once A Day  Allergies (verified): 1)  ! Sulfa 2)  ! Codeine 3)  ! Penicillin 4)  ! Streptomycin  Comments:  Nurse/Medical Assistant: The patient's medications and allergies were reviewed with the patient and were updated in the Medication and Allergy Lists. Avon Gully CMA, Duncan Dull) (August 15, 2010 11:04 AM)  Past History:  Past Medical History: Hepres Simplex of Left Eye  Hx Kidney stones  Hx R Br infilt  ductal carcinoma, prev on Tamoxifen and arimidex  meds:Arimidex 1mg  one daily  MVP-Asymptomatic   Physical Exam  General:  Well-developed,well-nourished,in no acute distress; alert,appropriate and cooperative throughout examination Head:  Normocephalic and atraumatic without obvious abnormalities. No apparent alopecia or balding. Msk:  normal flexion-extension rotation and side bending of the lumbar spine.  She is very tender over the left SI joint.  She has no back tenderness.  She is also tender over the left greater trochanter.  Normal hip flexion and extension.  pain in her left back with straight leg raise.  Knee and ankle strength 5/5 bilaterally.  Hip strength 5 out of 5 bilaterally.  No lumbar spine or paraspinous muscle tenderness.   Impression & Recommendations:  Problem # 1:  SCIATICA (ICD-724.3) Based on exam I think she has sciatica. She is also tender over the SI joint and has some tenderness over the greater trochaner. Thus she likely has some mild bursitis as well. I recommend tx with steroids but she says she really swell even after a few days on steroids and would like to avoid these. for now she can continue with her rotation of Motrin and Tylenol.  She feels she needs something stronger she can certainly call the office and let us know. If xray is normal then will schedule  her for PT.  Orders: T-DG Lumbar Spine 2-3 Views (72100)  Complete Medication List: 1)  Calcium-vitamin D 250-125 Mg-unit Tabs (Calcium carbonate-vitamin d) .... One tab two times a day 2)  Benicar Hct 20-12.5 Mg Tabs (Olmesartan medoxomil-hctz) .... Take 1 tablet by mouth once a day 3)  Synthroid 75 Mcg Tabs (Levothyroxine sodium) .... Take 1 tablet by mouth once a day, except on the weekend take 1/2 tab daily 4)  Vitamin C 100 Mg Tabs (Ascorbic acid) 5)  Fish Oil Oil (Fish oil) .... Take one tablet by mouth once a day 6)  Vitamin D 2000 Unit Tabs (Cholecalciferol) .... Take one tablet by mouth once a  day 7)  B Complex 100 Tabs (B complex vitamins) .... Take one tablet by mouth once a day 8)  Triamcinolone Acetonide 0.5 % Crea (Triamcinolone acetonide) .... Apply once daily to low back.  Other Orders: Tdap => 27yrs IM ([address]) Admin 1st Vaccine ([address]454)  Patient Instructions: 1)  Gien Tdap today 2)  Apply the cream to you low back. If not helping after 1-2 weeks then call the office. After apply the triamcinolone apply your lotion on top. 3)  We will call you with the xray results  Prescriptions: TRIAMCINOLONE ACETONIDE 0.5 % CREA (TRIAMCINOLONE ACETONIDE) Apply once daily to low back.  #40 grams. x 0   Entered and Authorized by:   Nani Gasser MD   Signed by:   Nani Gasser MD on 08/15/2010   Method used:   Electronically to        [facility] (retail)       [address]       [place], [place]  [address]       Ph: [phone]       Fax: [phone]   RxID:   [id number]    Orders Added: 1)  T-DG Lumbar Spine 2-3 Views [72100] 2)  Tdap => 71yrs IM [90715] 3)  Admin 1st Vaccine [90471] 4)  Est. Patient Level IV [[address]]   Immunizations Administered:  Tetanus Vaccine:    Vaccine Type: Tdap    Site: left deltoid    Mfr: GlaxoSmithKline    Dose: 0.5 ml    Route: IM    Given by: Sue Lush McCrimmon CMA, (AAMA)    Exp. Date: 05/09/2012    Lot #: [id number]    VIS given: 06/07/08 version given August 15, 2010.   Immunizations Administered:  Tetanus Vaccine:    Vaccine Type: Tdap    Site: left deltoid    Mfr: GlaxoSmithKline    Dose: 0.5 ml    Route: IM    Given by: Sue Lush McCrimmon CMA, (AAMA)    Exp. Date: 05/09/2012    Lot #: [id number]    VIS given: 06/07/08 version given August 15, 2010.

## 2010-08-22 NOTE — Assessment & Plan Note (Signed)
Summary: PT NEEDS TO LEAVE URINE SAMPLE POSSIBLE BLADDER INFEC?-VEW  Nurse Visit   Vital Signs:  Patient profile:   70 year old female Temp:     98.2 degrees F oral  History of Present Illness: overall not feeling well,lower back pain, no burning with urination. [facility]   Allergies: 1)  ! Sulfa 2)  ! Codeine 3)  ! Penicillin 4)  ! Streptomycin Laboratory Results   Urine Tests  Date/Time Received: 07/05/10 Date/Time Reported: 07/05/10  Routine Urinalysis   Color: yellow Appearance: Clear Glucose: negative   (Normal Range: Negative) Bilirubin: negative   (Normal Range: Negative) Ketone: negative   (Normal Range: Negative) Spec. Gravity: 1.015   (Normal Range: 1.003-1.035) Blood: moderate   (Normal Range: Negative) pH: 5.5   (Normal Range: 5.0-8.0) Protein: negative   (Normal Range: Negative) Urobilinogen: 0.2   (Normal Range: 0-1) Nitrite: negative   (Normal Range: Negative) Leukocyte Esterace: small   (Normal Range: Negative)       Orders Added: 1)  UA Dipstick w/o Micro (automated)  [81003] 2)  T-Culture, Urine [[id number]] Prescriptions: NITROFURANTOIN MACROCRYSTAL 100 MG CAPS (NITROFURANTOIN MACROCRYSTAL) 1 capsule by mouth two times a day x 7 days  #14 x 0   Entered and Authorized by:   Seymour Bars DO   Signed by:   Seymour Bars DO on 07/05/2010   Method used:   Electronically to        [facility]* (retail)       [address]       [place], [place]  [address]       Ph: [phone]       Fax: [phone]   RxID:   [id number]     Impression & Recommendations:  Problem # 1:  HEMATURIA UNSPECIFIED (ICD-599.70) UA + for blood.  Sent for culture to see if bacteria grows out.  I will go ahead and start her on Macrobid (antibiotic) to take 2 x a day for 7 days to cover for UTI.  Will call her Mon with urine cx results.   Her updated medication list for this problem includes:    Nitrofurantoin  Macrocrystal 100 Mg Caps (Nitrofurantoin macrocrystal) .Marland Kitchen... 1 capsule by mouth two times a day x 7 days  Orders: UA Dipstick w/o Micro (automated)  (81003) T-Culture, Urine ([id number])  Complete Medication List: 1)  Calcium-vitamin D 250-125 Mg-unit Tabs (Calcium carbonate-vitamin d) .... One tab two times a day 2)  Benicar Hct 20-12.5 Mg Tabs (Olmesartan medoxomil-hctz) .... Take 1 tablet by mouth once a day 3)  Synthroid 75 Mcg Tabs (Levothyroxine sodium) .... Take 1 tablet by mouth once a day, except on the weekend take 1/2 tab daily 4)  Vitamin C 100 Mg Tabs (Ascorbic acid) 5)  Fish Oil Oil (Fish oil) .... Take one tablet by mouth once a day 6)  Vitamin D 2000 Unit Tabs (Cholecalciferol) .... Take one tablet by mouth once a day 7)  B Complex 100 Tabs (B complex vitamins) .... Take one tablet by mouth once a day 8)  Nitrofurantoin Macrocrystal 100 Mg Caps (Nitrofurantoin macrocrystal) .Marland Kitchen.. 1 capsule by mouth two times a day x 7 days   Appended Document: PT NEEDS TO LEAVE URINE SAMPLE POSSIBLE BLADDER INFEC?-VEW 07/05/10 acm 12:50 pt notified.

## 2010-09-05 NOTE — Miscellaneous (Signed)
Summary: PT [facility]  PT [facility]   Imported By: Maryln Gottron 08/28/2010 14:33:28  _____________________________________________________________________  External Attachment:    Type:   Image     Comment:   External Document

## 2010-09-23 ENCOUNTER — Encounter: Payer: Self-pay | Admitting: Family Medicine

## 2010-10-08 NOTE — Miscellaneous (Signed)
Summary: Physical Therapy Initial Evaluation/[facility]  Physical Therapy Initial Evaluation/[facility]   Imported By: Maryln Gottron 10/01/2010 15:08:48  _____________________________________________________________________  External Attachment:    Type:   Image     Comment:   External Document

## 2010-11-12 ENCOUNTER — Other Ambulatory Visit: Payer: Self-pay | Admitting: *Deleted

## 2010-11-12 MED ORDER — LEVOTHYROXINE SODIUM 75 MCG PO TABS: 75.0000 ug | ORAL_TABLET | Freq: Every day | ORAL | Status: DC

## 2010-12-03 NOTE — Op Note (Signed)
NAMESURAH, [name]                ACCOUNT NO.:  [id number]   MEDICAL RECORD NO.:  [id number]          PATIENT TYPE:  AMB   LOCATION:  DSC                          FACILITY:  [facility]   PHYSICIAN:  Katy Fitch. Sypher, M.D. DATE OF BIRTH:  [birth year]   DATE OF PROCEDURE:  03/30/2008  DATE OF DISCHARGE:                               OPERATIVE REPORT   PREOPERATIVE DIAGNOSES:  Chronic rotator cuff tear right shoulder with  MRI evidence of subscapularis tear, supraspinatus and infraspinatus tear  and background acromioclavicular arthropathy.   POSTOPERATIVE DIAGNOSES:  Chronic rotator cuff tear right shoulder with  MRI evidence of subscapularis tear, supraspinatus and infraspinatus tear  and background acromioclavicular arthropathy with confirmation of three  tendon rotator cuff retracted tear and chronic acromioclavicular  arthropathy with additional finding of synovitis and glenohumeral labral  degenerative changes and adhesive capsulitis.   OPERATIONS:  1. Examination of right shoulder under anesthesia.  2. Arthroscopic debridement of adhesive capsulitis, granulation      tissue, adhesions, and synovitis.  3. Arthroscopic subacromial decompression with bursectomy,      coracoacromial ligament relaxation, and acromioplasty.  4. Arthroscopic distal clavicle resection.  5. Reconstruction of rotator cuff including subscapularis repair with      medial Bio-Corkscrew anchor and tunneled through bone McLaughlin      suture deep to biceps tendon, and reconstruction of supraspinatus      and infraspinatus rotator cuff tears utilizing a medial Corkscrew      anchor and two McLaughlin through bone sutures as well as an over-      the-top inset suture to a swivel lock.   OPERATING SURGEON:  Katy Fitch. Sypher, MD   ASSISTANT:  Marveen Reeks Dasnoit, PA-C   ANESTHESIA:  General endotracheal supplemented by right interscalene  block.   SUPERVISING ANESTHESIOLOGIST:  Burna Forts, MD   INDICATIONS:  Kathleen [name] is a 70 year old patient who has been  familiar with our practice for many years.   She presented for evaluation and management of chronic right shoulder  pain.  Clinical examination suggested a probable internal derangement  and rotator cuff tear.  Plain films of the shoulder demonstrated AC  arthropathy and unfavorable AC anatomy with reactive changes of the  greater tuberosity.   An MRI of the shoulder confirmed subscapularis rotator cuff degenerative  tearing and a degenerative rotator cuff tear involving in the  supraspinatus and infraspinatus tendons primarily on the bursal surface.   Due to the failure to respond to nonoperative measures, she is brought  to the operating room at this time for anticipated reconstruction of her  right rotator cuff.   Preoperatively, Kathleen [name] was advised the potential risks and benefits  of surgery.  She was interviewed by Dr. Jacklynn Bue who provided an  anesthesia consult.  After informed consent, Dr. Jacklynn Bue placed a right  interscalene block in the holding area.   PROCEDURE:  Kathleen [name] is brought to room 2 of the [facility] and placed in supine position upon the operating table.   Under Dr. Marlane Mingle direct supervision, general anesthesia by  endotracheal  technique was induced.   She was carefully positioned in the beach-chair position with the aid of  a torso and head holders on for shoulder arthroscopy.  Examination of  the shoulder under anesthesia revealed no signs of significant capsular  contracture, but some loss of extension consistent with adhesive  capsulitis.   The right upper extremity and forequarter were prepped with DuraPrep and  draped with impervious arthroscopy drapes.   The scope was introduced through a standard posterior viewing portal.  Diagnostic arthroscopy confirmed adhesive capsulitis granulation tissues  and some adhesions between the subscapularis and the anterior  capsule.  There was a grade 3 subscapularis degenerative tear that was retracted 2  cm medially.  The long head of the biceps had a 20% degenerative tear.  Careful inspection of the deep surface of the supraspinatus and  infraspinatus tendons revealed a full-thickness tear of the  supraspinatus and a significant degenerative bursal side tear of the  infraspinatus.   The biceps and anterior capsular adhesions were debrided with a 4.5-mm  suction shaver and the electrocautery brought in through an anterior  portal.  The subscapularis tear was debrided and the retracted nature of  the grade 3 tear was confirmed.   The scope was used to inspect the entire glenohumeral joint confirming  intact hyaline articular cartilage surfaces on the glenoid and humeral  head.  The biceps origin was stable at the superior labrum.  The teres  minor was fundamentally intact.   The scope was removed from the glenohumeral joint and placed in the  subacromial space.   The retracted bursal side tear of the supraspinatus and infraspinatus  tendons were confirmed.  Soft tissues were cleared followed by partial  use of the coracoacromial ligament and leveling the acromion to a type 1  morphology.  The capsule of AC joint was taken down and the distal 15 mm  clavicle was removed arthroscopically.  After hemostasis was achieved,  the scope was removed and we proceeded to an anterior middle third  deltoid splitting incision to repair the subscapularis and the  supraspinatus and infraspinatus rotator cuff tears.   We initially approached the subscapularis by flexing and externally  rotating the humerus.  The capsule of the joint was taken down anterior  to the biceps tendon and the retracted supraspinatus recovered with a  Kocher clamp.  After debridement of the margin, the lesser tuberosity  was decorticated with a power bur followed by placement of a medial Bio-  Corkscrew anchor at its medial footprint of  insertion at the articular  margin.  The subscapularis was captured with a grasping suture of #2  FiberWire.  This was meticulously tunneled in one of the biceps tendon  with the aid of a concept suture passing set.  The medial footprint of  the subscapularis repair was accomplished with 2 mattress sutures from  the Bio-Corkscrew.  The tunnel sutures were then tensioned replacing the  subscapularis into an anatomic footprint while internally rotating the  humerus.  This was tied over the lateral cortex of the humerus lateral  to the biceps tendon followed by inset of the medial margin with a 2  mattress sutures placed off the Corkscrew.   A very satisfactory repair was achieved.  Care was taken to avoid the  biceps tendon throughout this procedure.   We then directed our attention to repair of the infraspinatus and  supraspinatus tendons.  The deep surface of the tear was debrided with a  rongeur  followed by decortication of the entire greater tuberosity at  the footprints of the supraspinatus and infraspinatus.  Care was taken  to protect the biceps tendon.  A medial Bio-Corkscrew anchor was placed  at the posterior supraspinatus and two McLaughlin through bone sutures  of place individually for the supraspinatus and one individually for the  infraspinatus.  These were then passed through bone tunnels intention  laterally followed by inset of the four tails of the medial Corkscrew  anchor creating a proper footprint for the supraspinatus and  infraspinatus repairs.   The repair was finished with an over-the-top suture utilizing a lateral  swivel lock insetting the margin.   The subacromial space was then lavaged with sterile saline followed by  repair of the deltoid with an apical suture of #2 FiberWire and simple  interrupted sutures of 0 Vicryl.  The skin was repaired with  subcutaneous suture of 2-0 Vicryl and intradermal 3-0 Prolene.   The wound was dressed with Steri-Strips,  sterile gauze, sterile ABD  pads, and paper tape.   Ms. Morgan will be admitted to recovery care center for observation of  vital signs.  We anticipate provision of Ancef 1 g IV q.8 h. as a  prophylactic antibiotic.  She was provided Ancef in preoperative area  without sign of any allergic response.  She is also provided p.o. and IV  Dilaudid as a perioperative analgesic.      Katy Fitch Sypher, M.D.  Electronically Signed     RVS/MEDQ  D:  03/30/2008  T:  03/31/2008  Job:  [id number]

## 2010-12-06 ENCOUNTER — Other Ambulatory Visit: Payer: Self-pay | Admitting: Family Medicine

## 2010-12-06 NOTE — Procedures (Signed)
[place]  Patient:    Kathleen [name], Kathleen [name]                       MRN: [id number] Proc. Date: 03/16/00 Adm. Date:  [id number] Attending:  Mingo Amber CC:         Dolan Amen, M.D.                           Procedure Report  PROCEDURE PERFORMED:  Video colonoscopy.  ENDOSCOPIST:  Roosvelt Harps, M.D.  INDICATIONS:  Screening 70 year old female with three prior malignancies.  PREPARATION:  She is n.p.o. since midnight having taken Phospho-Soda prep and a clear liquid diet.  The mucosa throughout is clean.  DEPTH OF INSERTION:  Cecum.  PREPROCEDURE SEDATION:  She received a total of 100 mg of Demerol and 7.5 mg of Versed intravenously.  In addition, she was on 2L of nasal cannula O2.  DESCRIPTION OF PROCEDURE:  The Olympus video colonoscope was inserted via the rectum and advanced very easily to the hepatic flexure.  At this point extra-abdominal pressure was required in order to reach the cecum.  The cecal landmarks were identified and photographed.  On withdrawal, the mucosa was carefully evaluated and found to be entirely normal from cecum to retroflexed view of the rectum.  There were no areas of polyposis, inflammation, diverticulosis or other abnormality noted.  The patient tolerated the procedure well.  Pulse, blood pressure and oximetry testing were stable throughout.  She was observed in recovery for  45 minutes and discharged home alert with a benign abdomen.  IMPRESSION:  Normal screening colonoscopy.  RECOMMENDATIONS:  Return to the office p.r.n.  Consideration could be given to repeating the colonoscopy in 10 years. DD:  03/16/00 TD:  03/17/00 Job: [id number]

## 2010-12-16 ENCOUNTER — Encounter: Payer: Self-pay | Admitting: Family Medicine

## 2010-12-18 ENCOUNTER — Ambulatory Visit (INDEPENDENT_AMBULATORY_CARE_PROVIDER_SITE_OTHER): Payer: 59 | Admitting: Family Medicine

## 2010-12-18 ENCOUNTER — Encounter: Payer: Self-pay | Admitting: Family Medicine

## 2010-12-18 ENCOUNTER — Other Ambulatory Visit (HOSPITAL_COMMUNITY)
Admission: RE | Admit: 2010-12-18 | Discharge: 2010-12-18 | Disposition: A | Discharge status: Home / Self Care | Payer: 59 | Source: Ambulatory Visit | Attending: Family Medicine | Admitting: Family Medicine

## 2010-12-18 VITALS — BP 133/73 | HR 65 | Ht 62.5 in | Wt 135.0 lb

## 2010-12-18 DIAGNOSIS — Z124 Encounter for screening for malignant neoplasm of cervix: Secondary | ICD-10-CM | POA: Insufficient documentation

## 2010-12-18 DIAGNOSIS — Z1159 Encounter for screening for other viral diseases: Secondary | ICD-10-CM | POA: Insufficient documentation

## 2010-12-18 DIAGNOSIS — Z01419 Encounter for gynecological examination (general) (routine) without abnormal findings: Secondary | ICD-10-CM

## 2010-12-18 MED ORDER — OLMESARTAN MEDOXOMIL-HCTZ 20-12.5 MG PO TABS: 1.0000 | ORAL_TABLET | Freq: Every day | ORAL | Status: DC

## 2010-12-18 NOTE — Progress Notes (Signed)
  Subjective:    Patient ID: Kathleen [name], female    DOB: 03/21/1941, 70 y.o.   MRN: [id number]  HPI Here for Pap only.  No complaints. Post menopausal.     Review of Systems     Objective:   Physical Exam  Genitourinary: Vagina normal and uterus normal. No vaginal discharge found.       External exam is normal.  Very atrophic vaginal introitus and tissue.  Had to use a small speculum. Difficulty to get a good exam of the cervix. Did have a mucocele on the cervix.  Easily friable.   Cervix was stenotic.         Assessment & Plan:  She has some vaginal atrophy appropriate for her age.  Samples colllected. We will call with the results.

## 2010-12-26 ENCOUNTER — Telehealth: Payer: Self-pay | Admitting: Family Medicine

## 2010-12-26 NOTE — Telephone Encounter (Signed)
Pt notified that pap normal and if sexually active to repeat in 3 years.  Had to [facility]. Jarvis Newcomer, LPN Domingo Dimes

## 2010-12-26 NOTE — Telephone Encounter (Signed)
Call pt: Pap is nl.  Repeat in 3 years if sexually active.

## 2011-01-06 ENCOUNTER — Other Ambulatory Visit: Payer: Self-pay | Admitting: Family Medicine

## 2011-01-14 ENCOUNTER — Encounter: Payer: Self-pay | Admitting: Family Medicine

## 2011-01-14 ENCOUNTER — Ambulatory Visit (INDEPENDENT_AMBULATORY_CARE_PROVIDER_SITE_OTHER): Payer: 59 | Admitting: Family Medicine

## 2011-01-14 DIAGNOSIS — R42 Dizziness and giddiness: Secondary | ICD-10-CM

## 2011-01-14 DIAGNOSIS — H8110 Benign paroxysmal vertigo, unspecified ear: Secondary | ICD-10-CM

## 2011-01-14 DIAGNOSIS — E039 Hypothyroidism, unspecified: Secondary | ICD-10-CM

## 2011-01-14 DIAGNOSIS — I10 Essential (primary) hypertension: Secondary | ICD-10-CM

## 2011-01-14 DIAGNOSIS — R55 Syncope and collapse: Secondary | ICD-10-CM

## 2011-01-14 LAB — TSH: TSH: 0.312 u[IU]/mL — ABNORMAL LOW (ref 0.350–4.500)

## 2011-01-14 LAB — CBC WITH DIFFERENTIAL/PLATELET
HCT: 37.9 % (ref 36.0–46.0)
Lymphocytes Relative: 41 % (ref 12–46)
Neutro Abs: 3 10*3/uL (ref 1.7–7.7)
Neutrophils Relative %: 54 % (ref 43–77)
Platelets: 245 10*3/uL (ref 150–400)
RDW: 12.3 % (ref 11.5–15.5)
WBC: 5.6 10*3/uL (ref 4.0–10.5)

## 2011-01-14 NOTE — Patient Instructions (Signed)
Stop Benicar/ HCTZ and change to samples of plain Benicar 40 mg once daily.  Labs today. Will call you w/ results tomorrow.  Hydrate well each day.  Return for a nurse visit BP check in 3 wks.

## 2011-01-14 NOTE — Progress Notes (Signed)
  Subjective:    Patient ID: Kathleen [name], female    DOB: [birth year], 70 y.o.   MRN: [id number]  HPI 70 yo WF presents for a vertigo episode that started on Thursday.  She was at the beach and thinks she was twisting her head too much from laying on her stomach.  She took some bonine and layed in bed the rest of the day.  She had some nausea but no vomitting.  She was better on Friday morning.  She went out to eat on Friday and on the way home, her stomach hurt and she had to have diarrhea.  She passed out in the car on the way to the bathroom. She had one episode of diarrhea and then her stomach felt better. She was sweating but she did not have N/V or dizziness.  She is back to feeling normal but notes a feeling of lightheadness that is not like her vertigo and occurs at rest and w// position changes.  She stands most of the day at work, drinks little fluids and is on HCTZ in her BP medicine.  BP 151/78  Pulse 69  Ht 5\' 2"  (1.575 m)  Wt 133 lb (60.328 kg)  BMI 24.33 kg/m2  SpO2 100%    Review of Systems  Constitutional: Negative for fatigue.  HENT: Negative for ear pain.   Respiratory: Negative for chest tightness and shortness of breath.   Cardiovascular: Negative for chest pain, palpitations and leg swelling.  Genitourinary: Negative for frequency and difficulty urinating.  Neurological: Positive for weakness and light-headedness. Negative for tremors and headaches.       Objective:   Physical Exam  Constitutional: She appears well-developed and well-nourished. No distress.  HENT:  Right Ear: External ear normal.  Left Ear: External ear normal.  Mouth/Throat: Oropharynx is clear and moist.  Eyes: Conjunctivae are normal. No scleral icterus.  Neck: Neck supple. No thyromegaly present.  Cardiovascular: Normal rate, regular rhythm and normal heart sounds.  Exam reveals no friction rub.   No murmur heard. Pulmonary/Chest: Effort normal and breath sounds normal. No respiratory  distress.  Abdominal: Soft. Bowel sounds are normal. She exhibits no distension. There is no tenderness. There is no guarding.  Musculoskeletal: She exhibits no edema.  Neurological: She has normal reflexes.       No tremor  Skin: Skin is warm and dry. No pallor.  Psychiatric: She has a normal mood and affect.          Assessment & Plan:  1.  BPPV episode: resolved after a few hrs and bonine.  2.  Vasovagal syncope occuring from abdominal pain and then resolved immediately after diarrhea (from meal eaten prior).  No further treatment needed for this.  Explained phenomenon to pt.    3. Lighheadedness- orthostatic are NEG today but she is likely a little dry.  She is on a diuretic, stands most of the day and gets little fluid intake.  Will change her Benicar HCTZ to Benicar 40 mg/ day -- samples given and she will RTC for a nurse BP check in 3 wks.  If BP improves and her lightheadedness is better, will continue this.  Will add some labs today to r/o other causes.  She can work on fluid hydration and try compression hose also.

## 2011-01-14 NOTE — Assessment & Plan Note (Signed)
Changed Benicar HCTZ 20/12.5 to plain Benicar 40 mg/ day due to constantly feeling lightheaded.  She drinks little fluids and likely is getting volume depleted.  Will check her BUN/Cr with labs today.  RTC for nurse visit in 3 wks.

## 2011-01-15 ENCOUNTER — Telehealth: Payer: Self-pay | Admitting: Family Medicine

## 2011-01-15 LAB — BASIC METABOLIC PANEL WITH GFR
CO2: 26 mEq/L (ref 19–32)
Calcium: 10.2 mg/dL (ref 8.4–10.5)
GFR, Est African American: >60 mL/min (ref >60)
Sodium: 141 mEq/L (ref 135–145)

## 2011-01-15 MED ORDER — LEVOTHYROXINE SODIUM 50 MCG PO TABS: 50.0000 ug | ORAL_TABLET | Freq: Every day | ORAL | Status: DC

## 2011-01-15 NOTE — Telephone Encounter (Signed)
Pls let pt know that she was a bit dehydrated on her labs.  This should improve off the HCTZ (removed yesterday).  Blood counts and sugar look good.  We do need to cut back on her Synthroid dose.  Will change her to 50 mcg once daily and recheck her TSH in 6 wks.

## 2011-01-15 NOTE — Telephone Encounter (Signed)
Pt notified of her recent lab values.  Told should improve off the HCTZ.  Told synthorid dose changed and to pup new script at her pharm.  Told to repeat TSH in 8 weeks. Jarvis Newcomer, LPN Domingo Dimes

## 2011-01-23 ENCOUNTER — Telehealth: Payer: Self-pay | Admitting: Family Medicine

## 2011-01-23 ENCOUNTER — Ambulatory Visit (INDEPENDENT_AMBULATORY_CARE_PROVIDER_SITE_OTHER): Payer: Medicare Other | Admitting: Family Medicine

## 2011-01-23 VITALS — BP 155/78 | HR 74 | Temp 98.7°F

## 2011-01-23 DIAGNOSIS — R300 Dysuria: Secondary | ICD-10-CM

## 2011-01-23 LAB — POCT URINALYSIS DIPSTICK
Bilirubin, UA: NEGATIVE
Glucose, UA: NEGATIVE
Ketones, UA: NEGATIVE
Spec Grav, UA: 1.01

## 2011-01-23 MED ORDER — CIPROFLOXACIN HCL 500 MG PO TABS: 500.0000 mg | ORAL_TABLET | Freq: Two times a day (BID) | ORAL | Status: DC

## 2011-01-23 MED ORDER — SULFAMETHOXAZOLE-TMP DS 800-160 MG PO TABS: 1.0000 | ORAL_TABLET | Freq: Two times a day (BID) | ORAL | Status: DC

## 2011-01-23 NOTE — Progress Notes (Signed)
  Subjective:    Patient ID: Kathleen [name], female    DOB: 1941-01-11, 70 y.o.   MRN: [id number]  HPI Dysuria and back pain x 2 days. Recent switch on BP meds.      Review of Systems     Objective:   Physical Exam        Assessment & Plan:

## 2011-01-23 NOTE — Telephone Encounter (Signed)
OK to come in for nurse visit this afternoon.

## 2011-01-23 NOTE — Telephone Encounter (Signed)
Called patient and told to come now for a nurse visit to check a urinalysis. Kathleen Newcomer, LPN Domingo Dimes

## 2011-01-23 NOTE — Telephone Encounter (Signed)
Pt called and said she is having a ? UTI now.  Seen two weeks ago for dehydration.  Do you want to schedule a nurse visit this pm or see the patient??? Plan:  Routed to Dr. Marlyne Beards, LPN Domingo Dimes

## 2011-01-23 NOTE — Telephone Encounter (Signed)
Pt called.  Ab tx sent to her pharm is sulfa property and she is allergic to sulfa.  Notified Dr. Linford Arnold and she will send Cipro. Jarvis Newcomer, LPN Domingo Dimes

## 2011-01-31 ENCOUNTER — Encounter: Payer: Self-pay | Admitting: Family Medicine

## 2011-01-31 ENCOUNTER — Ambulatory Visit (INDEPENDENT_AMBULATORY_CARE_PROVIDER_SITE_OTHER): Payer: 59 | Admitting: Family Medicine

## 2011-01-31 VITALS — BP 143/75 | HR 69 | Wt 134.0 lb

## 2011-01-31 DIAGNOSIS — N390 Urinary tract infection, site not specified: Secondary | ICD-10-CM

## 2011-01-31 DIAGNOSIS — I10 Essential (primary) hypertension: Secondary | ICD-10-CM

## 2011-01-31 LAB — POCT URINALYSIS DIPSTICK
Glucose, UA: NEGATIVE
Ketones, UA: NEGATIVE
Leukocytes, UA: NEGATIVE
Spec Grav, UA: 1.015

## 2011-01-31 MED ORDER — METOPROLOL SUCCINATE ER 25 MG PO TB24: 25.0000 mg | ORAL_TABLET | Freq: Every day | ORAL | Status: DC

## 2011-01-31 MED ORDER — CIPROFLOXACIN HCL 500 MG PO TABS: 500.0000 mg | ORAL_TABLET | Freq: Two times a day (BID) | ORAL | Status: AC

## 2011-01-31 NOTE — Progress Notes (Signed)
  Subjective:    Patient ID: Kathleen [name], female    DOB: March 03, 1941, 70 y.o.   MRN: [id number]  Hypertension This is a chronic problem. The problem has been gradually improving since onset. The problem is uncontrolled. Pertinent negatives include no blurred vision, chest pain or shortness of breath. There are no associated agents to hypertension. Risk factors for coronary artery disease include no known risk factors. Past treatments include angiotensin blockers. There are no compliance problems.    Says her urinary sxs did get better after the cipro. Starting last night with urinary frequency. No dysuria with this one. No fever or back pain.    Review of Systems  Eyes: Negative for blurred vision.  Respiratory: Negative for shortness of breath.   Cardiovascular: Negative for chest pain.       Objective:   Physical Exam  Constitutional: She appears well-developed and well-nourished.  Cardiovascular: Normal rate, regular rhythm and normal heart sounds.   Pulmonary/Chest: Effort normal and breath sounds normal.  Skin: Skin is warm and dry.  Psychiatric: She has a normal mood and affect.          Assessment & Plan:  Urinary frequency-we will send her urine for culture. I went ahead and sent and antibiotics her pharmacies if her symptoms worsen this weekend she can go ahead and fill this. She says she responded very quickly to Cipro so I did send another prescription for Cipro. There is a possibility she may have a resistant UTI which is why her symptoms are back so quickly.

## 2011-01-31 NOTE — Patient Instructions (Addendum)
Can start the toprol at bedtime.   We will call you with the urine culture results.

## 2011-01-31 NOTE — Assessment & Plan Note (Signed)
Blood pressure almost at goal. It is improved. At this point we should definitely avoid diuretics as this seems to cause lightheadedness dizziness and dehydration. I will add a low-dose beta blocker and see her back in one month.

## 2011-02-02 LAB — URINE CULTURE
Colony Count: NO GROWTH
Organism ID, Bacteria: NO GROWTH

## 2011-02-03 ENCOUNTER — Telehealth: Payer: Self-pay | Admitting: Family Medicine

## 2011-02-03 NOTE — Telephone Encounter (Signed)
LMOM advising pt of results and to call if still w/sx.

## 2011-02-03 NOTE — Telephone Encounter (Signed)
Cal pt: urine cx is neg. If still having sxs then let me know and can refer to Urology for further eval.

## 2011-02-10 ENCOUNTER — Other Ambulatory Visit: Payer: Self-pay | Admitting: *Deleted

## 2011-02-10 MED ORDER — OLMESARTAN MEDOXOMIL 40 MG PO TABS: 40.0000 mg | ORAL_TABLET | Freq: Every day | ORAL | Status: DC

## 2011-03-05 ENCOUNTER — Encounter: Payer: Self-pay | Admitting: Family Medicine

## 2011-03-14 ENCOUNTER — Ambulatory Visit (INDEPENDENT_AMBULATORY_CARE_PROVIDER_SITE_OTHER): Payer: 59 | Admitting: Family Medicine

## 2011-03-14 ENCOUNTER — Encounter: Payer: Self-pay | Admitting: Family Medicine

## 2011-03-14 VITALS — BP 161/78 | HR 69 | Wt 136.0 lb

## 2011-03-14 DIAGNOSIS — I10 Essential (primary) hypertension: Secondary | ICD-10-CM

## 2011-03-14 DIAGNOSIS — E039 Hypothyroidism, unspecified: Secondary | ICD-10-CM

## 2011-03-14 DIAGNOSIS — Z23 Encounter for immunization: Secondary | ICD-10-CM

## 2011-03-14 NOTE — Assessment & Plan Note (Signed)
Home BPs look great today. No changes. F.U in 6 months. I do thinks she has an elemet of White coat HTN. Her home BP seems very accurate against our machine.

## 2011-03-14 NOTE — Progress Notes (Signed)
  Subjective:    Patient ID: Kathleen [name], female    DOB: 1941-06-27, 70 y.o.   MRN: [id number]  Hypertension This is a chronic problem. The current episode started more than 1 year ago. The problem is controlled. Pertinent negatives include no chest pain or shortness of breath. There are no associated agents to hypertension. Past treatments include angiotensin blockers. There are no compliance problems.   She says she is no longer feeling dizzy after stopping the diuretic.     Review of Systems  Respiratory: Negative for shortness of breath.   Cardiovascular: Negative for chest pain.       Objective:   Physical Exam  Constitutional: She is oriented to person, place, and time. She appears well-developed and well-nourished.  HENT:  Head: Normocephalic and atraumatic.  Cardiovascular: Normal rate, regular rhythm and normal heart sounds.   Pulmonary/Chest: Effort normal and breath sounds normal.  Neurological: She is alert and oriented to person, place, and time.  Skin: Skin is warm and dry.  Psychiatric: She has a normal mood and affect. Her behavior is normal.          Assessment & Plan:  Flu shot given today.

## 2011-03-15 ENCOUNTER — Telehealth: Payer: Self-pay | Admitting: Family Medicine

## 2011-03-15 MED ORDER — LEVOTHYROXINE SODIUM 75 MCG PO TABS: 75.0000 ug | ORAL_TABLET | Freq: Every day | ORAL | Status: DC

## 2011-03-15 NOTE — Telephone Encounter (Signed)
Call pt: We need to inc her thyroid med and recheck level in 8 weeks.

## 2011-03-17 NOTE — Telephone Encounter (Signed)
Pt notified. KJ LPN 

## 2011-04-14 LAB — BASIC METABOLIC PANEL
CO2: 24
Calcium: 9.4
Creatinine, Ser: 0.88
GFR calc Af Amer: >60
GFR calc non Af Amer: >60

## 2011-04-14 LAB — URINALYSIS, ROUTINE W REFLEX MICROSCOPIC
Bilirubin Urine: NEGATIVE
Ketones, ur: NEGATIVE
Nitrite: NEGATIVE
Protein, ur: NEGATIVE
Urobilinogen, UA: 0.2

## 2011-04-14 LAB — CBC
MCHC: 34.1
RBC: 4.34

## 2011-04-14 LAB — DIFFERENTIAL
Basophils Absolute: 0
Basophils Relative: 1
Monocytes Relative: 6
Neutro Abs: 6.2
Neutrophils Relative %: 72

## 2011-04-14 LAB — URINE MICROSCOPIC-ADD ON

## 2011-04-23 ENCOUNTER — Other Ambulatory Visit: Payer: Self-pay | Admitting: Family Medicine

## 2011-04-23 DIAGNOSIS — Z1231 Encounter for screening mammogram for malignant neoplasm of breast: Secondary | ICD-10-CM

## 2011-04-23 LAB — BASIC METABOLIC PANEL
GFR calc non Af Amer: >60
Glucose, Bld: 111 — ABNORMAL HIGH
Potassium: 4.3
Sodium: 140

## 2011-04-23 LAB — POCT HEMOGLOBIN-HEMACUE: Hemoglobin: 13.1

## 2011-05-16 ENCOUNTER — Encounter: Payer: Self-pay | Admitting: Family Medicine

## 2011-05-16 ENCOUNTER — Ambulatory Visit (INDEPENDENT_AMBULATORY_CARE_PROVIDER_SITE_OTHER): Payer: Medicare Other | Admitting: Family Medicine

## 2011-05-16 VITALS — BP 163/79 | HR 71 | Wt 133.0 lb

## 2011-05-16 DIAGNOSIS — I10 Essential (primary) hypertension: Secondary | ICD-10-CM

## 2011-05-16 DIAGNOSIS — E039 Hypothyroidism, unspecified: Secondary | ICD-10-CM

## 2011-05-16 DIAGNOSIS — Z0000 Encounter for general adult medical examination without abnormal findings: Secondary | ICD-10-CM

## 2011-05-16 LAB — COMPLETE METABOLIC PANEL WITH GFR
ALT: 15 U/L (ref 0–35)
AST: 21 U/L (ref 0–37)
Albumin: 4.5 g/dL (ref 3.5–5.2)
Calcium: 9.4 mg/dL (ref 8.4–10.5)
Chloride: 105 mEq/L (ref 96–112)
Potassium: 4 mEq/L (ref 3.5–5.3)
Total Protein: 6.6 g/dL (ref 6.0–8.3)

## 2011-05-16 LAB — LIPID PANEL
LDL Cholesterol: 143 mg/dL — ABNORMAL HIGH (ref 0–99)
VLDL: 10 mg/dL (ref 0–40)

## 2011-05-16 NOTE — Patient Instructions (Signed)
Start a regular exercise program and make sure you are eating a healthy diet Try to eat 4 servings of dairy a day or take a calcium supplement (500mg twice a day). Your vaccines are up to date.   

## 2011-05-16 NOTE — Progress Notes (Signed)
  Subjective:     Kathleen [name] is a 70 y.o. female and is here for a comprehensive physical exam. The patient reports no problems.  History   Social History  . Marital Status: Married    Spouse Name: N/A    Number of Children: N/A  . Years of Education: N/A   Occupational History  . Not on file.   Social History Main Topics  . Smoking status: Never Smoker   . Smokeless tobacco: Not on file  . Alcohol Use: No  . Drug Use:   . 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   Other Topics Concern  . Not on file   Social History Narrative  . No narrative on file   Health Maintenance  Topic Date Due  . Mammogram  05/15/2011  . Influenza Vaccine  10/12/2011  . Colonoscopy  04/29/2017  . Tetanus/tdap  08/15/2020  . Pneumococcal Polysaccharide Vaccine Age 39 And Over  Completed  . Zostavax  Completed    The following portions of the patient's history were reviewed and updated as appropriate: allergies, current medications, past family history, past medical history, past social history, past surgical history and problem list.  Review of Systems A comprehensive review of systems was negative.   Objective:    BP 163/79  Pulse 71  Wt 133 lb (60.328 kg) General appearance: alert, cooperative and appears stated age Head: Normocephalic, without obvious abnormality, atraumatic Eyes: Conjunctiva clear, extraocular movements intact, pupils equal round reactive to light and accommodation Ears: normal TM's and external ear canals both ears we did have to irrigate and then manually disimpact cerumen to see her TMs which are normal. Nose: Nares normal. Septum midline. Mucosa normal. No drainage or sinus tenderness. Throat: lips, mucosa, and tongue normal; teeth and gums normal Neck: no adenopathy, no carotid bruit, supple, symmetrical, trachea midline and thyroid not enlarged, symmetric, no  tenderness/mass/nodules Back: symmetric, no curvature. ROM normal. No CVA tenderness. Lungs: clear to auscultation bilaterally Breasts: No nipple retraction or dimpling, No nipple discharge or bleeding, No axillary or supraclavicular adenopathy, Normal to palpation without dominant masses, Scars are well-healed Heart: regular rate and rhythm, S1, S2 normal, no murmur, click, rub or gallop Abdomen: soft, non-tender; bowel sounds normal; no masses,  no organomegaly Extremities: extremities normal, atraumatic, no cyanosis or edema Pulses: 2+ and symmetric Skin: Skin color, texture, turgor normal. No rashes or lesions Lymph nodes: Cervical, supraclavicular, and axillary nodes normal. Neurologic: Grossly normal    Assessment:    Healthy female exam.   Plan:     See After Visit Summary for Counseling Recommendations  Start a regular exercise program and make sure you are eating a healthy diet Try to eat 4 servings of dairy a day or take a calcium supplement (500mg  twice a day). Your vaccines are up to date.  She was given a lab slip for blood work today. We'll call her with the results. Her mammogram is scheduled for next week.  Cerumen impaction-her ears were irrigated and then manually curetted to disimpact. Patient felt immediate relief.

## 2011-05-20 ENCOUNTER — Other Ambulatory Visit: Payer: Self-pay | Admitting: *Deleted

## 2011-05-20 ENCOUNTER — Ambulatory Visit
Admission: RE | Admit: 2011-05-20 | Discharge: 2011-05-20 | Disposition: A | Discharge status: Home / Self Care | Payer: 59 | Source: Ambulatory Visit | Attending: Family Medicine | Admitting: Family Medicine

## 2011-05-20 ENCOUNTER — Telehealth: Payer: Self-pay | Admitting: *Deleted

## 2011-05-20 DIAGNOSIS — Z1231 Encounter for screening mammogram for malignant neoplasm of breast: Secondary | ICD-10-CM

## 2011-05-20 NOTE — Telephone Encounter (Signed)
LMOM with results

## 2011-05-20 NOTE — Telephone Encounter (Signed)
Message copied by Wyline Beady on Tue May 20, 2011  2:29 PM ------      Message from: Nani Gasser D      Created: Mon May 19, 2011  8:49 PM       CMP is nl. LDL is up to 143. It was 129 last year. Work on low fat diet adn exercise (aerobic 30 min 5 days per week).  Thyroid looks great. Recheck LDL in 6 mo.

## 2011-05-23 ENCOUNTER — Telehealth: Payer: Self-pay | Admitting: *Deleted

## 2011-05-23 NOTE — Telephone Encounter (Signed)
Message copied by Wyline Beady on Fri May 23, 2011 10:31 AM ------      Message from: Nani Gasser D      Created: Thu May 22, 2011  4:50 PM       Please call patient. Normal mammogram.  Repeat in 1 year.

## 2011-05-23 NOTE — Telephone Encounter (Signed)
LMOM  With results 

## 2011-08-05 ENCOUNTER — Other Ambulatory Visit: Payer: Self-pay | Admitting: *Deleted

## 2011-08-05 MED ORDER — LEVOTHYROXINE SODIUM 75 MCG PO TABS: 75.0000 ug | ORAL_TABLET | Freq: Every day | ORAL | Status: DC

## 2011-08-20 DIAGNOSIS — M67919 Unspecified disorder of synovium and tendon, unspecified shoulder: Secondary | ICD-10-CM | POA: Diagnosis not present

## 2011-08-20 DIAGNOSIS — M19019 Primary osteoarthritis, unspecified shoulder: Secondary | ICD-10-CM | POA: Diagnosis not present

## 2011-08-20 DIAGNOSIS — M719 Bursopathy, unspecified: Secondary | ICD-10-CM | POA: Diagnosis not present

## 2011-08-22 DIAGNOSIS — M67919 Unspecified disorder of synovium and tendon, unspecified shoulder: Secondary | ICD-10-CM | POA: Diagnosis not present

## 2011-08-22 DIAGNOSIS — M719 Bursopathy, unspecified: Secondary | ICD-10-CM | POA: Diagnosis not present

## 2011-08-25 DIAGNOSIS — M19019 Primary osteoarthritis, unspecified shoulder: Secondary | ICD-10-CM | POA: Diagnosis not present

## 2011-08-25 DIAGNOSIS — M67919 Unspecified disorder of synovium and tendon, unspecified shoulder: Secondary | ICD-10-CM | POA: Diagnosis not present

## 2011-08-25 DIAGNOSIS — M719 Bursopathy, unspecified: Secondary | ICD-10-CM | POA: Diagnosis not present

## 2011-09-10 DIAGNOSIS — M75120 Complete rotator cuff tear or rupture of unspecified shoulder, not specified as traumatic: Secondary | ICD-10-CM | POA: Diagnosis not present

## 2011-10-07 DIAGNOSIS — C4491 Basal cell carcinoma of skin, unspecified: Secondary | ICD-10-CM | POA: Diagnosis not present

## 2011-10-28 DIAGNOSIS — H2510 Age-related nuclear cataract, unspecified eye: Secondary | ICD-10-CM | POA: Diagnosis not present

## 2011-11-03 ENCOUNTER — Other Ambulatory Visit: Payer: Self-pay | Admitting: *Deleted

## 2011-11-03 MED ORDER — LEVOTHYROXINE SODIUM 75 MCG PO TABS: 75.0000 ug | ORAL_TABLET | Freq: Every day | ORAL | Status: DC

## 2011-11-26 DIAGNOSIS — B0052 Herpesviral keratitis: Secondary | ICD-10-CM | POA: Diagnosis not present

## 2011-11-28 ENCOUNTER — Ambulatory Visit: Payer: 59 | Admitting: Family Medicine

## 2011-12-12 ENCOUNTER — Other Ambulatory Visit: Payer: Self-pay | Admitting: Family Medicine

## 2011-12-12 ENCOUNTER — Encounter: Payer: Self-pay | Admitting: Family Medicine

## 2011-12-12 ENCOUNTER — Ambulatory Visit (INDEPENDENT_AMBULATORY_CARE_PROVIDER_SITE_OTHER): Payer: Medicare Other | Admitting: Family Medicine

## 2011-12-12 DIAGNOSIS — R197 Diarrhea, unspecified: Secondary | ICD-10-CM

## 2011-12-12 DIAGNOSIS — N390 Urinary tract infection, site not specified: Secondary | ICD-10-CM

## 2011-12-12 DIAGNOSIS — R109 Unspecified abdominal pain: Secondary | ICD-10-CM

## 2011-12-12 DIAGNOSIS — R5383 Other fatigue: Secondary | ICD-10-CM

## 2011-12-12 DIAGNOSIS — E039 Hypothyroidism, unspecified: Secondary | ICD-10-CM

## 2011-12-12 LAB — POCT URINALYSIS DIPSTICK
Leukocytes, UA: NEGATIVE
Protein, UA: NEGATIVE
Urobilinogen, UA: 0.2

## 2011-12-12 LAB — CBC W/MCH & 3 PART DIFF
HCT: 38 % (ref 36.0–46.0)
Lymphs Abs: 1.5 10*3/uL (ref 0.7–4.0)
MCH: 30.1 pg (ref 26.0–34.0)
MCV: 88.8 fL (ref 78.0–100.0)
Platelets: 245 10*3/uL (ref 150–400)
RBC: 4.28 MIL/uL (ref 3.87–5.11)
WBC mixed population %: 8 % (ref 3–18)
WBC mixed population: 1 10*3/uL (ref 0.1–1.8)

## 2011-12-12 MED ORDER — CIPROFLOXACIN HCL 500 MG PO TABS: 500.0000 mg | ORAL_TABLET | Freq: Two times a day (BID) | ORAL | Status: DC

## 2011-12-12 MED ORDER — CIPROFLOXACIN HCL 500 MG PO TABS: 500.0000 mg | ORAL_TABLET | Freq: Two times a day (BID) | ORAL | Status: AC

## 2011-12-12 MED ORDER — METRONIDAZOLE 500 MG PO TABS: 500.0000 mg | ORAL_TABLET | Freq: Three times a day (TID) | ORAL | Status: AC

## 2011-12-12 NOTE — Progress Notes (Signed)
  Subjective:    Patient ID: Kathleen [name], female    DOB: 09-11-1940, 71 y.o.   MRN: [id number]  HPI Not felt well for 3 weeks. Says had some discomfort in her abdomen and pelvis. Says just feels sore.  Had some inc urinary freq. Waking up 3 times at night.  This is affecting her sleep. Thought maybe she would try melatonin.  Says stomach feels sour.  No fever.  Gets a lot of cramping with her BMs. Then feels better after BM.  Has been that way for years.  Stools are usually loose. Rarely constipated.  Last time saw GI was years ago.  Says sometimes ranch dresssing, ice cream aggrevate it. Weight is down a couple of pounds.  NO blood in the stool. She just feels fatigued.   Review of Systems     Objective:   Physical Exam  Constitutional: She is oriented to person, place, and time. She appears well-developed and well-nourished.  HENT:  Head: Normocephalic and atraumatic.  Right Ear: External ear normal.  Left Ear: External ear normal.  Nose: Nose normal.  Mouth/Throat: Oropharynx is clear and moist.       TMs and canals are clear.   Eyes: Conjunctivae and EOM are normal. Pupils are equal, round, and reactive to light.  Neck: Neck supple. No thyromegaly present.  Cardiovascular: Normal rate, regular rhythm and normal heart sounds.   Pulmonary/Chest: Effort normal and breath sounds normal. She has no wheezes.  Abdominal: Soft. Bowel sounds are normal. She exhibits distension. She exhibits no mass. There is no tenderness. There is no rebound and no guarding.       + diffusely tender.   Musculoskeletal: She exhibits no edema.  Lymphadenopathy:    She has no cervical adenopathy.  Neurological: She is alert and oriented to person, place, and time.  Skin: Skin is warm and dry.  Psychiatric: She has a normal mood and affect. Her behavior is normal.          Assessment & Plan:  UTI - UA with mod blood but neg nitrite and leukocystes.  Will place on cipro. She has had some microscopic  hematuria in the past so this may or may not be a true infection.  Fatigue - check thyroid and potassium  Diarrhea - could be IBS. Consider lactose intolerant.  Will check milk allergy. No fever but consider diverticulitis. Will check CBC.  Consider avoiding all milk products even if the test does come back negative for months to see if this makes a difference in her symptoms. I also recommend referral to GI since she has not been there in years to rule out any other problems. Work on staying hydrated.  Hypothyroid- Recheck TSH. Has been over 6 months.

## 2011-12-13 LAB — BASIC METABOLIC PANEL WITH GFR
BUN: 17 mg/dL (ref 6–23)
CO2: 25 mEq/L (ref 19–32)
Calcium: 9.3 mg/dL (ref 8.4–10.5)
GFR, Est African American: 79 mL/min
Glucose, Bld: 102 mg/dL — ABNORMAL HIGH (ref 70–99)
Potassium: 4.2 mEq/L (ref 3.5–5.3)

## 2011-12-14 LAB — URINE CULTURE: Colony Count: 6000

## 2011-12-16 ENCOUNTER — Telehealth: Payer: Self-pay | Admitting: *Deleted

## 2011-12-16 ENCOUNTER — Ambulatory Visit: Payer: Medicare Other | Admitting: Family Medicine

## 2011-12-16 NOTE — Telephone Encounter (Signed)
Pt states that you told her to call today and let you know how she feels. Pt states she is having diarrhea and feels it is coming from the medication. Pt states she had liquids all weekend. Please advise.

## 2011-12-16 NOTE — Telephone Encounter (Signed)
She can go ahead and stop the antibiotics. Is she still week or is she lying in bed all day or that better?

## 2011-12-16 NOTE — Telephone Encounter (Signed)
Pt states that she has been moving around more today. States she thinks all of this is from the medicine and her not eating. She asked about what to eat so I told her to start with the BRAT diet and increase as tolerated. Pt asked if she should keep appt with GI and I informed her to go ahead and keep appt.

## 2011-12-19 DIAGNOSIS — R1084 Generalized abdominal pain: Secondary | ICD-10-CM | POA: Diagnosis not present

## 2011-12-19 DIAGNOSIS — R197 Diarrhea, unspecified: Secondary | ICD-10-CM | POA: Diagnosis not present

## 2011-12-19 DIAGNOSIS — R634 Abnormal weight loss: Secondary | ICD-10-CM | POA: Diagnosis not present

## 2011-12-23 DIAGNOSIS — B0052 Herpesviral keratitis: Secondary | ICD-10-CM | POA: Diagnosis not present

## 2011-12-26 ENCOUNTER — Encounter: Payer: Self-pay | Admitting: Family Medicine

## 2011-12-26 ENCOUNTER — Ambulatory Visit (INDEPENDENT_AMBULATORY_CARE_PROVIDER_SITE_OTHER): Payer: Medicare Other | Admitting: Family Medicine

## 2011-12-26 VITALS — BP 134/72 | HR 100 | Ht 62.0 in | Wt 129.0 lb

## 2011-12-26 DIAGNOSIS — B0050 Herpesviral ocular disease, unspecified: Secondary | ICD-10-CM

## 2011-12-26 DIAGNOSIS — E039 Hypothyroidism, unspecified: Secondary | ICD-10-CM | POA: Diagnosis not present

## 2011-12-26 DIAGNOSIS — R319 Hematuria, unspecified: Secondary | ICD-10-CM

## 2011-12-26 DIAGNOSIS — D72829 Elevated white blood cell count, unspecified: Secondary | ICD-10-CM

## 2011-12-26 DIAGNOSIS — K529 Noninfective gastroenteritis and colitis, unspecified: Secondary | ICD-10-CM

## 2011-12-26 LAB — POCT URINALYSIS DIPSTICK
Glucose, UA: NEGATIVE
Nitrite, UA: NEGATIVE
Protein, UA: NEGATIVE
Urobilinogen, UA: 0.2

## 2011-12-26 NOTE — Progress Notes (Signed)
  Subjective:    Patient ID: Kathleen [name], female    DOB: [birth year], 71 y.o.   MRN: [id number]  HPI Did see Dr. Randon Goldsmith PA for her left eye sxs. 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  Had stopped her valtrex since was on the ABX and cipro for possible diverticulitis. Her eye started bothering her around that time..  Then saw Dr. Renee Harder last week who put her back on the valtrex.  She is back on it.  She still feels the left eye is a little swollen it is still having difficulty focusing and. She has a followup in about one week. She does have early onset cataracts but they're very mild. He also noted some scar tissue in her left eye. He is aware that she will likely have to be on the Valtrex for life.  Did see GI and told was not diverticulitis.  Says likely bacterial infection.  She says the Cipro greatly helped her urinary symptoms but once we added the metronidazole she started actually felt worse. She stopped it after 3 days. Her cholesterol just where she did not need to continue it. She does have a followup with him in about 6 weeks. She has been started on a probiotic, Florastor.   Hypothyroid-recheck her thyroid in about a week but we will go ahead and do it today.  Review of Systems     Objective:   Physical Exam  Constitutional: She is oriented to person, place, and time. She appears well-developed and well-nourished.  HENT:  Head: Normocephalic and atraumatic.  Cardiovascular: Normal rate, regular rhythm and normal heart sounds.   Pulmonary/Chest: Effort normal and breath sounds normal.  Abdominal: Soft. Bowel sounds are normal. She exhibits no distension and no mass. There is tenderness. There is no rebound and no guarding.       Mild diffuse tenderness   Neurological: She is alert and oriented to person, place, and time.  Skin: Skin is warm and dry.  Psychiatric: She has a normal mood and affect. Her behavior is normal.          Assessment & Plan:  Colitis - symptoms have completely  resolved. Would like to repeat a white count today to make sure that has resolved. With her previous history of cancer she is very worried about this.  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 She has followup with ophthalmology next week. She says she does wake up with her eye feeling very dry. She would benefit from moisturizing drops or ointment, I encouraged her to talk to Dr. Hazle Quant about this.  Hypothyroid - Recheck TSH.    Hematuria-her urine culture technically came back negative though she was symptomatic and didn't get significant improvement with the Cipro. We will repeat a urinalysis today. It was positive for trace blood today. I will send her sample for micro-and a repeat culture. If it is negative then we may consider further workup for hematuria. She does have a prior history of kidney stones but no current pain suggests that she has any acute stones.

## 2011-12-27 LAB — CBC WITH DIFFERENTIAL/PLATELET
Basophils Absolute: 0 10*3/uL (ref 0.0–0.1)
Lymphocytes Relative: 32 % (ref 12–46)
Lymphs Abs: 2.2 10*3/uL (ref 0.7–4.0)
MCV: 89.7 fL (ref 78.0–100.0)
Neutro Abs: 4.1 10*3/uL (ref 1.7–7.7)
Neutrophils Relative %: 61 % (ref 43–77)
Platelets: 309 10*3/uL (ref 150–400)
RBC: 4.27 MIL/uL (ref 3.87–5.11)
WBC: 6.7 10*3/uL (ref 4.0–10.5)

## 2011-12-27 LAB — TSH: TSH: 0.242 u[IU]/mL — ABNORMAL LOW (ref 0.350–4.500)

## 2011-12-27 LAB — URINALYSIS, MICROSCOPIC ONLY: Bacteria, UA: NONE SEEN

## 2011-12-28 LAB — URINE CULTURE: Colony Count: 2000

## 2011-12-30 DIAGNOSIS — B0052 Herpesviral keratitis: Secondary | ICD-10-CM | POA: Diagnosis not present

## 2012-01-01 ENCOUNTER — Telehealth: Payer: Self-pay | Admitting: *Deleted

## 2012-01-01 MED ORDER — LEVOTHYROXINE SODIUM 50 MCG PO TABS: 50.0000 ug | ORAL_TABLET | Freq: Every day | ORAL | Status: DC

## 2012-01-01 NOTE — Telephone Encounter (Signed)
Pt states she doesn't have any and states she will come in 8 weeks to recheck TSH. Sent Levothyroxine 50 mcg tabs to pharmacy.

## 2012-01-01 NOTE — Telephone Encounter (Signed)
Thyroid is still a little low. I would like to drop her to 2 days a week and 75 all other days. See if she still has some 50s at home then recheck in 8 weeks.

## 2012-01-01 NOTE — Telephone Encounter (Signed)
Pt would like to know about her TSH results. Please advise.

## 2012-02-03 ENCOUNTER — Other Ambulatory Visit: Payer: Self-pay | Admitting: Family Medicine

## 2012-02-06 DIAGNOSIS — R197 Diarrhea, unspecified: Secondary | ICD-10-CM | POA: Diagnosis not present

## 2012-02-17 DIAGNOSIS — B0052 Herpesviral keratitis: Secondary | ICD-10-CM | POA: Diagnosis not present

## 2012-02-24 DIAGNOSIS — B0052 Herpesviral keratitis: Secondary | ICD-10-CM | POA: Diagnosis not present

## 2012-03-03 ENCOUNTER — Other Ambulatory Visit: Payer: Self-pay | Admitting: Family Medicine

## 2012-03-03 DIAGNOSIS — Z1231 Encounter for screening mammogram for malignant neoplasm of breast: Secondary | ICD-10-CM

## 2012-05-06 DIAGNOSIS — Z85828 Personal history of other malignant neoplasm of skin: Secondary | ICD-10-CM | POA: Diagnosis not present

## 2012-05-06 DIAGNOSIS — L819 Disorder of pigmentation, unspecified: Secondary | ICD-10-CM | POA: Diagnosis not present

## 2012-05-06 DIAGNOSIS — L570 Actinic keratosis: Secondary | ICD-10-CM | POA: Diagnosis not present

## 2012-05-06 DIAGNOSIS — L821 Other seborrheic keratosis: Secondary | ICD-10-CM | POA: Diagnosis not present

## 2012-05-25 ENCOUNTER — Ambulatory Visit
Admission: RE | Admit: 2012-05-25 | Discharge: 2012-05-25 | Disposition: A | Discharge status: Home / Self Care | Payer: Medicare Other | Source: Ambulatory Visit | Attending: Family Medicine | Admitting: Family Medicine

## 2012-05-25 DIAGNOSIS — Z1231 Encounter for screening mammogram for malignant neoplasm of breast: Secondary | ICD-10-CM

## 2012-05-28 ENCOUNTER — Ambulatory Visit (INDEPENDENT_AMBULATORY_CARE_PROVIDER_SITE_OTHER): Payer: Medicare Other | Admitting: Family Medicine

## 2012-05-28 ENCOUNTER — Encounter: Payer: Self-pay | Admitting: Family Medicine

## 2012-05-28 VITALS — BP 165/82 | HR 79 | Ht 62.0 in | Wt 129.0 lb

## 2012-05-28 DIAGNOSIS — I10 Essential (primary) hypertension: Secondary | ICD-10-CM

## 2012-05-28 DIAGNOSIS — Z780 Asymptomatic menopausal state: Secondary | ICD-10-CM

## 2012-05-28 DIAGNOSIS — Z0000 Encounter for general adult medical examination without abnormal findings: Secondary | ICD-10-CM

## 2012-05-28 DIAGNOSIS — M899 Disorder of bone, unspecified: Secondary | ICD-10-CM

## 2012-05-28 DIAGNOSIS — M949 Disorder of cartilage, unspecified: Secondary | ICD-10-CM | POA: Diagnosis not present

## 2012-05-28 DIAGNOSIS — E785 Hyperlipidemia, unspecified: Secondary | ICD-10-CM

## 2012-05-28 DIAGNOSIS — E039 Hypothyroidism, unspecified: Secondary | ICD-10-CM | POA: Diagnosis not present

## 2012-05-28 DIAGNOSIS — Z23 Encounter for immunization: Secondary | ICD-10-CM | POA: Diagnosis not present

## 2012-05-28 LAB — LIPID PANEL
HDL: 61 mg/dL (ref >39)
LDL Cholesterol: 133 mg/dL — ABNORMAL HIGH (ref 0–99)
Total CHOL/HDL Ratio: 3.3 Ratio

## 2012-05-28 LAB — COMPLETE METABOLIC PANEL WITH GFR
ALT: 14 U/L (ref 0–35)
Albumin: 4.2 g/dL (ref 3.5–5.2)
Alkaline Phosphatase: 61 U/L (ref 39–117)
CO2: 26 mEq/L (ref 19–32)
GFR, Est African American: >89 mL/min
GFR, Est Non African American: 83 mL/min
Glucose, Bld: 82 mg/dL (ref 70–99)
Potassium: 3.9 mEq/L (ref 3.5–5.3)
Sodium: 143 mEq/L (ref 135–145)
Total Bilirubin: 1 mg/dL (ref 0.3–1.2)
Total Protein: 6.6 g/dL (ref 6.0–8.3)

## 2012-05-28 LAB — TSH: TSH: 0.903 u[IU]/mL (ref 0.350–4.500)

## 2012-05-28 NOTE — Progress Notes (Signed)
Subjective:    Kathleen Kathleen [name] is a 71 y.o. female who presents for Medicare Annual/Subsequent preventive examination.  Preventive Screening-Counseling & Management  Tobacco History  Smoking status  . Never Smoker   Smokeless tobacco  . Not on file     Problems Prior to Visit 1. she reports that she quit taking her blood pressure medication. She start atenolol lightheaded and dizzy so decided to stop it. She got her blood pressure was running too low. The she has not been checking it off the medication. The dizziness has improved.  Current Problems (verified) Patient Active Problem List  Diagnosis  . HYPOTHYROIDISM, UNSPECIFIED  . HYPERTENSION, BENIGN SYSTEMIC  . MITRAL VALVE DISORDER  . SHOULDER PAIN, LEFT  . HIP PAIN, RIGHT  . LEG PAIN, BILATERAL  . OSTEOPENIA  . IMPAIRED FASTING GLUCOSE  . POSTMENOPAUSAL STATUS  . HEMATURIA UNSPECIFIED  . SCIATICA    Medications Prior to Visit Current Outpatient Prescriptions on File Prior to Visit  Medication Sig Dispense Refill  . B-Complex TABS Take 1 tab by mouth daily.       . Cholecalciferol (VITAMIN D) 1000 UNITS capsule Take 1,000 Units by mouth daily.        . fish oil-omega-3 fatty acids 1000 MG capsule Take 1 g by mouth daily.        Marland Kitchen levothyroxine (SYNTHROID, LEVOTHROID) 50 MCG tablet Take 1 tablet (50 mcg total) by mouth daily.  30 tablet  1  . Probiotic Product (PROBIOTIC FORMULA PO) Take by mouth.      . SYNTHROID 75 MCG tablet TAKE ONE (1) TABLET EACH DAY  30 tablet  1  . ValACYclovir HCl (VALTREX PO) Take by mouth.      . olmesartan (BENICAR) 40 MG tablet Take 1 tablet (40 mg total) by mouth daily.  30 tablet  11    Current Medications (verified) Current Outpatient Prescriptions  Medication Sig Dispense Refill  . B-Complex TABS Take 1 tab by mouth daily.       . Cholecalciferol (VITAMIN D) 1000 UNITS capsule Take 1,000 Units by mouth daily.        . fish oil-omega-3 fatty acids 1000 MG capsule Take 1 g by mouth  daily.        Marland Kitchen levothyroxine (SYNTHROID, LEVOTHROID) 50 MCG tablet Take 1 tablet (50 mcg total) by mouth daily.  30 tablet  1  . Probiotic Product (PROBIOTIC FORMULA PO) Take by mouth.      . SYNTHROID 75 MCG tablet TAKE ONE (1) TABLET EACH DAY  30 tablet  1  . ValACYclovir HCl (VALTREX PO) Take by mouth.      . olmesartan (BENICAR) 40 MG tablet Take 1 tablet (40 mg total) by mouth daily.  30 tablet  11     Allergies (verified) Codeine; Hctz; Penicillins; Streptomycin; and Sulfonamide derivatives   PAST HISTORY  Family History Family History  Problem Relation Age of Onset  . Cancer Mother     breast    Social History History  Substance Use Topics  . Smoking status: Never Smoker   . Smokeless tobacco: Not on file  . Alcohol Use: No     Are there smokers in your home (other than you)? No  Risk Factors Current exercise habits: none  Dietary issues discussed: none   Cardiac risk factors: advanced age (older than 63 for men, 58 for women).  Depression Screen (Note: if answer to either of the following is "Yes", a more complete depression screening  is indicated)   Over the past two weeks, have you felt down, depressed or hopeless? No  Over the past two weeks, have you felt little interest or pleasure in doing things? No  Have you lost interest or pleasure in daily life? No  Do you often feel hopeless? No  Do you cry easily over simple problems? No  Activities of Daily Living In your present state of health, do you have any difficulty performing the following activities?:  Driving? No Managing money?  No Feeding yourself? No Getting from bed to chair? No Climbing a flight of stairs? No Preparing food and eating?: No Bathing or showering? No Getting dressed: No Getting to the toilet? No Using the toilet:No Moving around from place to place: No In the past year have you fallen or had a near fall?:No   Are you sexually active?  No  Do you have more than one partner?   No  Hearing Difficulties: No Do you often ask people to speak up or repeat themselves? No Do you experience ringing or noises in your ears? No Do you have difficulty understanding soft or whispered voices? No   Do you feel that you have a problem with memory? No  Do you often misplace items? No  Do you feel safe at home?  No  Cognitive Testing  Alert? Yes  Normal Appearance?Yes  Oriented to Kathleen [name]? Yes  Place? Yes   Time? Yes  Recall of three objects?  Yes  Can perform simple calculations? Yes  Displays appropriate judgment?Yes  Can read the correct time from a watch face?Yes   Advanced Directives have been discussed with the patient? Yes  List the Names of Other Physician/Practitioners you currently use: 1.  Dr. Hazle Quant (ophtho) 2. Dr. Herbert Deaner any recent Medical Services you may have received from other than [facility] providers in the past year (date may be approximate).  Immunization History  Administered Date(s) Administered  . Influenza Split 05/28/2012  . Influenza Whole 05/21/2005, 05/19/2007, 04/27/2008, 03/29/2010, 03/14/2011  . Pneumococcal Polysaccharide 06/12/2009  . Td 08/15/2010  . Zoster 03/29/2010    Screening Tests Health Maintenance  Topic Date Due  . Influenza Vaccine  03/21/2012  . Mammogram  05/25/2013  . Tetanus/tdap  08/15/2020  . Colonoscopy  03/03/2022  . Pneumococcal Polysaccharide Vaccine Age 1 And Over  Completed  . Zostavax  Completed    All answers were reviewed with the patient and necessary referrals were made:  Kathleen Laura, MD   05/28/2012   History reviewed: allergies, current medications, past family history, past medical history, past social history, past surgical history and problem list  Review of Systems A comprehensive review of systems was negative.    Objective:     Vision by Snellen chart: right eye:20/50, left eye:20/200  Body mass index is 23.59 kg/(m^2). BP 165/82  Pulse 79  Ht 5\' 2"  (1.575 m)  Wt 129 lb  (58.514 kg)  BMI 23.59 kg/m2  BP 165/82  Pulse 79  Ht 5\' 2"  (1.575 m)  Wt 129 lb (58.514 kg)  BMI 23.59 kg/m2  General Appearance:    Alert, cooperative, no distress, appears stated age  Head:    Normocephalic, without obvious abnormality, atraumatic  Eyes:    PERRL, conjunctiva/corneas clear, EOM's intact, fundi    benign, both eyes  Ears:    Normal TM's and external ear canals, both ears  Nose:   Nares normal, septum midline, mucosa normal, no drainage    or sinus tenderness  Throat:   Lips, mucosa, and tongue normal; teeth and gums normal  Neck:   Supple, symmetrical, trachea midline, no adenopathy;    thyroid:  no enlargement/tenderness/nodules; no carotid   bruit or JVD  Back:     Symmetric, no curvature, ROM normal, no CVA tenderness  Lungs:     Clear to auscultation bilaterally, respirations unlabored  Chest Wall:    No tenderness or deformity   Heart:    Regular rate and rhythm, S1 and S2 normal, no murmur, rub   or gallop  Breast Exam:    Not performed  Abdomen:     Soft, non-tender, bowel sounds active all four quadrants,    no masses, no organomegaly  Genitalia:    Not performed.   Rectal:    Not performed.    guaiac negative stool  Extremities:   Extremities normal, atraumatic, no cyanosis or edema  Pulses:   2+ and symmetric all extremities  Skin:   Skin color, texture, turgor normal, no rashes or lesions  Lymph nodes:   Cervical, supraclavicular, and axillary nodes normal  Neurologic:   CNII-XII intact, normal strength, sensation and reflexes    throughout       Assessment:     Medicare Annual Wellness Exam     Plan:     During the course of the visit the patient was educated and counseled about appropriate screening and preventive services including:    Influenza vaccine  Bone densitometry screening  Diet review for nutrition referral? Yes ____  Not Indicated __x__   Patient Instructions (the written plan) was given to the patient.  Medicare  Attestation I have personally reviewed: The patient's medical and social history Their use of alcohol, tobacco or illicit drugs Their current medications and supplements The patient's functional ability including ADLs,fall risks, home safety risks, cognitive, and hearing and visual impairment Diet and physical activities Evidence for depression or mood disorders  The patient's weight, height, BMI, and visual acuity have been recorded in the chart.  I have made referrals, counseling, and provided education to the patient based on review of the above and I have provided the patient with a written personalized care plan for preventive services.     Tyeshia Cornforth, MD   05/28/2012    Hypertension-I. did encourage her to at least consider cutting her blood pressure pill in half and then stopping it completely. She may tolerate it better or may not feel as dizzy and her blood pressure may look great. If she still feels dizzy on this medication after restarting half a tab and please call me and we can consider switching classes of medications.

## 2012-05-28 NOTE — Assessment & Plan Note (Signed)
Hypertension-I. did encourage her to at least consider cutting her blood pressure pill in half and then stopping it completely. She may tolerate it better or may not feel as dizzy and her blood pressure may look great. If she still feels dizzy on this medication after restarting half a tab and please call me and we can consider switching classes of medications.

## 2012-05-28 NOTE — Patient Instructions (Addendum)
If blood pressure is running 140 or higher on the top please restart sure Benicar at a half a tab daily. If this still causes dizziness please let me know and we can change the medication. We will call you with your lab results. If you don't here from [place] in about a week then please give [place] a call at [phone].

## 2012-05-28 NOTE — Addendum Note (Signed)
Addended by: Nani Gasser D on: 05/28/2012 05:52 PM   Modules accepted: Level of Service

## 2012-06-01 DIAGNOSIS — B0052 Herpesviral keratitis: Secondary | ICD-10-CM | POA: Diagnosis not present

## 2012-06-11 ENCOUNTER — Ambulatory Visit
Admission: RE | Admit: 2012-06-11 | Discharge: 2012-06-11 | Disposition: A | Discharge status: Home / Self Care | Payer: 59 | Source: Ambulatory Visit | Attending: Family Medicine | Admitting: Family Medicine

## 2012-06-11 DIAGNOSIS — Z780 Asymptomatic menopausal state: Secondary | ICD-10-CM

## 2012-06-11 DIAGNOSIS — M949 Disorder of cartilage, unspecified: Secondary | ICD-10-CM

## 2012-06-21 ENCOUNTER — Telehealth: Payer: Self-pay | Admitting: Family Medicine

## 2012-06-21 NOTE — Telephone Encounter (Signed)
errir 

## 2012-06-25 DIAGNOSIS — B0052 Herpesviral keratitis: Secondary | ICD-10-CM | POA: Diagnosis not present

## 2012-07-27 ENCOUNTER — Other Ambulatory Visit: Payer: Self-pay | Admitting: *Deleted

## 2012-07-27 DIAGNOSIS — B0052 Herpesviral keratitis: Secondary | ICD-10-CM | POA: Diagnosis not present

## 2012-07-27 MED ORDER — LEVOTHYROXINE SODIUM 75 MCG PO TABS: 75.0000 ug | ORAL_TABLET | Freq: Every day | ORAL | Status: DC

## 2012-08-12 ENCOUNTER — Ambulatory Visit: Payer: 59 | Admitting: Family Medicine

## 2012-08-31 DIAGNOSIS — B0052 Herpesviral keratitis: Secondary | ICD-10-CM | POA: Diagnosis not present

## 2012-10-13 ENCOUNTER — Ambulatory Visit: Payer: 59 | Admitting: Family Medicine

## 2012-10-19 DIAGNOSIS — H35359 Cystoid macular degeneration, unspecified eye: Secondary | ICD-10-CM | POA: Diagnosis not present

## 2012-10-19 DIAGNOSIS — H2510 Age-related nuclear cataract, unspecified eye: Secondary | ICD-10-CM | POA: Diagnosis not present

## 2012-10-19 DIAGNOSIS — H35369 Drusen (degenerative) of macula, unspecified eye: Secondary | ICD-10-CM | POA: Diagnosis not present

## 2012-10-19 DIAGNOSIS — H43819 Vitreous degeneration, unspecified eye: Secondary | ICD-10-CM | POA: Diagnosis not present

## 2012-11-09 ENCOUNTER — Encounter: Payer: Self-pay | Admitting: Family Medicine

## 2012-11-09 ENCOUNTER — Ambulatory Visit (INDEPENDENT_AMBULATORY_CARE_PROVIDER_SITE_OTHER): Payer: Medicare Other | Admitting: Family Medicine

## 2012-11-09 VITALS — BP 147/70 | HR 76 | Wt 133.0 lb

## 2012-11-09 DIAGNOSIS — N390 Urinary tract infection, site not specified: Secondary | ICD-10-CM | POA: Diagnosis not present

## 2012-11-09 DIAGNOSIS — R3129 Other microscopic hematuria: Secondary | ICD-10-CM | POA: Diagnosis not present

## 2012-11-09 DIAGNOSIS — Z853 Personal history of malignant neoplasm of breast: Secondary | ICD-10-CM

## 2012-11-09 DIAGNOSIS — R109 Unspecified abdominal pain: Secondary | ICD-10-CM

## 2012-11-09 LAB — POCT URINALYSIS DIPSTICK
Ketones, UA: NEGATIVE
Leukocytes, UA: NEGATIVE
Protein, UA: NEGATIVE
pH, UA: 6.5

## 2012-11-09 LAB — CBC WITH DIFFERENTIAL/PLATELET
Eosinophils Absolute: 0.1 10*3/uL (ref 0.0–0.7)
Eosinophils Relative: 1 % (ref 0–5)
Lymphs Abs: 1.9 10*3/uL (ref 0.7–4.0)
MCH: 30 pg (ref 26.0–34.0)
MCV: 88.3 fL (ref 78.0–100.0)
Monocytes Absolute: 0.5 10*3/uL (ref 0.1–1.0)
Monocytes Relative: 8 % (ref 3–12)
Platelets: 264 10*3/uL (ref 150–400)
RBC: 4.6 MIL/uL (ref 3.87–5.11)

## 2012-11-09 NOTE — Progress Notes (Signed)
Subjective:    Patient ID: Kathleen [name], female    DOB: [birth year], 72 y.o.   MRN: [id number]  HPI Has had some left flank pain for several weeks. Worse with lying down and in the morning. She says when was having chem saw Dr. Earlene Plater, Urology, and had a scope and had some S.E. Last scope was around year 2000.   With the lisining of the bladder. Now starting to get pain on the right.   Says feels different that her regular UTI. No urianry freq or urgency.  No gross hematuria. Hx of kidney stones.  No radiation of pain upper down her back. No numbness or tingling. Prior history of breast cancer so she was just concerned because of persistent discomfort.   Review of Systems No CP or SOB.  No nausea or diarrhea.  No sig abdominal or pelvic pain.   BP 147/70  Pulse 76  Wt 133 lb (60.328 kg)  BMI 24.32 kg/m2    Allergies  Allergen Reactions  . Codeine   . Hctz (Hydrochlorothiazide)     Vertigo, dizziness.   Marland Kitchen Penicillins   . Streptomycin Other (See Comments)    Passed out  . Sulfonamide Derivatives     Past Medical History  Diagnosis Date  . Herpes simplex     lt eye  . Kidney stones     history  . Cancer     Hx RT breast infiltrated ductal CA previously on tamoxifen and armidex   . MVP (mitral valve prolapse)     asymptomatic    Past Surgical History  Procedure Laterality Date  . Cervical laminectomy  1986  . Abdominal hysterectomy      partial  . Breast lumpectomy      RT  . Throidectomy  1988    For cancer   . Shoulder surgery      rotator cuff repair    History   Social History  . Marital Status: Married    Spouse Name: N/A    Number of Children: N/A  . Years of Education: N/A   Occupational History  . Not on file.   Social History Main Topics  . Smoking status: Never Smoker   . Smokeless tobacco: Not on file  . Alcohol Use: No  . Drug Use:   . 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   Other Topics Concern  . Not on file   Social History Narrative  . No narrative on file    Family History  Problem Relation Age of Onset  . Breast cancer Mother     Outpatient Encounter Prescriptions as of 11/09/2012  Medication Sig Dispense Refill  . B-Complex TABS Take 1 tab by mouth daily.       . Cholecalciferol (VITAMIN D) 1000 UNITS capsule Take 1,000 Units by mouth daily.        . fish oil-omega-3 fatty acids 1000 MG capsule Take 1 g by mouth daily.        Marland Kitchen levothyroxine (SYNTHROID) 75 MCG tablet Take 1 tablet (75 mcg total) by mouth daily.  30 tablet  3  . olmesartan (BENICAR) 40 MG tablet Take 1 tablet (40 mg total) by mouth daily.  30 tablet  11  . Probiotic Product (PROBIOTIC FORMULA PO) Take by mouth.      . ValACYclovir HCl (VALTREX PO) Take by mouth.  No facility-administered encounter medications on file as of 11/09/2012.          Objective:   Physical Exam  Constitutional: She is oriented to person, place, and time. She appears well-developed and well-nourished.  HENT:  Head: Normocephalic and atraumatic.  Cardiovascular: Normal rate, regular rhythm and normal heart sounds.   Pulmonary/Chest: Effort normal and breath sounds normal.  Abdominal: Soft. Bowel sounds are normal. She exhibits no distension and no mass. There is no tenderness. There is no rebound and no guarding.  Musculoskeletal:  Nontender over the thoracic or lumbar spine. Nontender over the SI joints. She is a little bit tender over the left lower posterior ribs. No masses, lesions, rash. She has a little bit of discomfort in that area when she rotates to the right and left but no limitation on range of motion.  Neurological: She is alert and oriented to person, place, and time.  Skin: Skin is warm and dry.  Psychiatric: She has a normal mood and affect. Her behavior is normal.          Assessment & Plan:  Hematuria - Will refer back to  Dr. Earlene Plater. May be side effect from previsous chemo, not clear.  Will send the urine for a culture and we'll contact with results. In the meantime we'll go ahead and check a CBC today.  Left flank pain - Unclear etiology at this point in time.  Possible MSK but think would be better by now as no trauma or injury.  Could be kidney.  Will refer to Urology adn consider CT of abdomen. Will check CBC as well. Will check pancreatic enzymes. She has no chest symptoms such as shortness of breath. Call if any new symptoms. Certainly can use Tylenol or over-the-counter anti-inflammatory for pain relief if needed.

## 2012-11-10 LAB — COMPLETE METABOLIC PANEL WITH GFR
Albumin: 4.2 g/dL (ref 3.5–5.2)
BUN: 21 mg/dL (ref 6–23)
CO2: 29 mEq/L (ref 19–32)
Calcium: 9.5 mg/dL (ref 8.4–10.5)
Chloride: 104 mEq/L (ref 96–112)
GFR, Est Non African American: 87 mL/min
Glucose, Bld: 92 mg/dL (ref 70–99)
Potassium: 4.1 mEq/L (ref 3.5–5.3)

## 2012-11-16 DIAGNOSIS — L570 Actinic keratosis: Secondary | ICD-10-CM | POA: Diagnosis not present

## 2012-11-16 DIAGNOSIS — D485 Neoplasm of uncertain behavior of skin: Secondary | ICD-10-CM | POA: Diagnosis not present

## 2012-11-16 DIAGNOSIS — L821 Other seborrheic keratosis: Secondary | ICD-10-CM | POA: Diagnosis not present

## 2012-11-16 DIAGNOSIS — Z85828 Personal history of other malignant neoplasm of skin: Secondary | ICD-10-CM | POA: Diagnosis not present

## 2012-11-30 DIAGNOSIS — M545 Low back pain: Secondary | ICD-10-CM | POA: Diagnosis not present

## 2012-11-30 DIAGNOSIS — R3129 Other microscopic hematuria: Secondary | ICD-10-CM | POA: Diagnosis not present

## 2012-12-22 DIAGNOSIS — R3129 Other microscopic hematuria: Secondary | ICD-10-CM | POA: Diagnosis not present

## 2012-12-22 DIAGNOSIS — N281 Cyst of kidney, acquired: Secondary | ICD-10-CM | POA: Diagnosis not present

## 2013-01-22 DIAGNOSIS — IMO0002 Reserved for concepts with insufficient information to code with codable children: Secondary | ICD-10-CM | POA: Diagnosis not present

## 2013-03-25 ENCOUNTER — Other Ambulatory Visit: Payer: Self-pay | Admitting: *Deleted

## 2013-03-25 MED ORDER — LEVOTHYROXINE SODIUM 75 MCG PO TABS: 75.0000 ug | ORAL_TABLET | Freq: Every day | ORAL | Status: DC

## 2013-04-15 ENCOUNTER — Ambulatory Visit (INDEPENDENT_AMBULATORY_CARE_PROVIDER_SITE_OTHER): Payer: 59 | Admitting: Family Medicine

## 2013-04-15 ENCOUNTER — Encounter: Payer: Self-pay | Admitting: Family Medicine

## 2013-04-15 ENCOUNTER — Ambulatory Visit (INDEPENDENT_AMBULATORY_CARE_PROVIDER_SITE_OTHER): Payer: 59

## 2013-04-15 VITALS — BP 158/85 | HR 75 | Wt 129.0 lb

## 2013-04-15 DIAGNOSIS — I10 Essential (primary) hypertension: Secondary | ICD-10-CM | POA: Diagnosis not present

## 2013-04-15 DIAGNOSIS — M722 Plantar fascial fibromatosis: Secondary | ICD-10-CM | POA: Diagnosis not present

## 2013-04-15 DIAGNOSIS — Z23 Encounter for immunization: Secondary | ICD-10-CM

## 2013-04-15 DIAGNOSIS — M79609 Pain in unspecified limb: Secondary | ICD-10-CM

## 2013-04-15 DIAGNOSIS — M7730 Calcaneal spur, unspecified foot: Secondary | ICD-10-CM

## 2013-04-15 DIAGNOSIS — R7301 Impaired fasting glucose: Secondary | ICD-10-CM | POA: Diagnosis not present

## 2013-04-15 DIAGNOSIS — M79671 Pain in right foot: Secondary | ICD-10-CM

## 2013-04-15 LAB — POCT GLYCOSYLATED HEMOGLOBIN (HGB A1C): Hemoglobin A1C: 5.8

## 2013-04-15 MED ORDER — LISINOPRIL 10 MG PO TABS: 10.0000 mg | ORAL_TABLET | Freq: Every day | ORAL | Status: DC

## 2013-04-15 NOTE — Progress Notes (Signed)
  Subjective:    Patient ID: MARAE [name], female    DOB: 04-Apr-1941, 72 y.o.   MRN: [id number]  HPI Right foot pain - pain in heel x 3 months.  Stands a lot at her job.  It is getting worse.  Wears her allegria.  Has even put extra gel heel support.  Pianful to put her weight on it.  Not really taking anything for pain relief.  Not been icing it.     HTN- stopped her benicar bc was feeling dizzy.  Says dizziness has resolved completley since being off meds but BP has been running a little high.  No CP or SOB.   Review of Systems     Objective:   Physical Exam  Constitutional: She appears well-developed and well-nourished.  HENT:  Head: Normocephalic and atraumatic.  Musculoskeletal:  Right foot with no rashes or swelling. Ankle with normal range of motion. Strength at the ankle and great toe is 5 out of 5 with flexion and extension. Non-tender around the medial or lateral malleolus. She's tender directly over the bottom of the heel towards the instep or arch of the foot. No significant pain with dorsiflexion of the toes.  Skin: Skin is warm and dry.  Psychiatric: She has a normal mood and affect. Her behavior is normal.          Assessment & Plan:  Right foot pain - most consistent with plantar fasciitis versus heel spur. This is been going on for 3 months we'll go ahead and get an x-ray today. I discussed with her that the treatment initially the same. Recommend an anti-inflammatory. Take with food and water to avoid GI upset. Stop immediately if any GI irritation. Recommend icing massage as well. And gentle stretches. Handout provided with guidelines for her stretches. If she's not improving over the next couple weeks for her pain is becoming unbearable then can refer to my partner Dr. Rodney Langton for plantar fascia injection and possible night splints. I do think she wears good supportive shoe wear.  HTN- Will add lisinopril instead and see if tolerates better.  Please let  me know if she expenses any dizziness on the new medication. Did start with a low dose and we can adjust as needed when I see her back. She has a followup appointment in about 2 weeks.  IFG - A1c is 5.8 today. Continue current regimen. Continue to eat healthy and get regular exercise and repeat in 6 months.

## 2013-05-03 ENCOUNTER — Ambulatory Visit (INDEPENDENT_AMBULATORY_CARE_PROVIDER_SITE_OTHER): Payer: 59 | Admitting: Family Medicine

## 2013-05-03 ENCOUNTER — Encounter: Payer: Self-pay | Admitting: Family Medicine

## 2013-05-03 VITALS — BP 140/80 | HR 73 | Wt 130.0 lb

## 2013-05-03 DIAGNOSIS — I10 Essential (primary) hypertension: Secondary | ICD-10-CM

## 2013-05-03 DIAGNOSIS — N390 Urinary tract infection, site not specified: Secondary | ICD-10-CM

## 2013-05-03 DIAGNOSIS — Z0000 Encounter for general adult medical examination without abnormal findings: Secondary | ICD-10-CM | POA: Diagnosis not present

## 2013-05-03 DIAGNOSIS — R319 Hematuria, unspecified: Secondary | ICD-10-CM

## 2013-05-03 DIAGNOSIS — E039 Hypothyroidism, unspecified: Secondary | ICD-10-CM

## 2013-05-03 LAB — POCT URINALYSIS DIPSTICK
Nitrite, UA: NEGATIVE
Spec Grav, UA: >=1.03
Urobilinogen, UA: 0.2
pH, UA: 6

## 2013-05-03 NOTE — Progress Notes (Signed)
Subjective:    Patient ID: Kathleen [name], female    DOB: June 22, 1941, 72 y.o.   MRN: [id number]  HPI    Review of Systems     Objective:   Physical Exam        Assessment & Plan:    Subjective:    Kathleen [name] is a 72 y.o. female who presents for Medicare Annual/Subsequent preventive examination.  Preventive Screening-Counseling & Management  Tobacco History  Smoking status  . Never Smoker   Smokeless tobacco  . Not on file     Problems Prior to Visit 1. hypertension-we did restart lisinopril at 10 mg. She has felt a little ahead since starting it. She has had a cough as well but feels like it's more viral. She's had some postnasal drainage and mild congestion.  Current Problems (verified) Patient Active Problem List   Diagnosis Date Noted  . History of breast cancer 11/09/2012  . SCIATICA 08/15/2010  . HEMATURIA UNSPECIFIED 07/05/2010  . POSTMENOPAUSAL STATUS 03/29/2010  . IMPAIRED FASTING GLUCOSE 12/11/2008  . LEG PAIN, BILATERAL 01/20/2008  . SHOULDER PAIN, LEFT 01/05/2008  . HIP PAIN, RIGHT 01/05/2008  . OSTEOPENIA 02/26/2007  . HYPOTHYROIDISM, UNSPECIFIED 04/28/2006  . HYPERTENSION, BENIGN SYSTEMIC 04/28/2006  . MITRAL VALVE DISORDER 04/28/2006    Medications Prior to Visit Current Outpatient Prescriptions on File Prior to Visit  Medication Sig Dispense Refill  . B-Complex TABS Take 1 tab by mouth daily.       . fish oil-omega-3 fatty acids 1000 MG capsule Take 1 g by mouth daily.        Marland Kitchen levothyroxine (SYNTHROID) 75 MCG tablet Take 1 tablet (75 mcg total) by mouth daily.  30 tablet  3  . lisinopril (PRINIVIL,ZESTRIL) 10 MG tablet Take 1 tablet (10 mg total) by mouth daily.  30 tablet  0  . Probiotic Product (PROBIOTIC FORMULA PO) Take by mouth as needed.       . ValACYclovir HCl (VALTREX PO) Take by mouth.       No current facility-administered medications on file prior to visit.    Current Medications (verified) Current Outpatient  Prescriptions  Medication Sig Dispense Refill  . B-Complex TABS Take 1 tab by mouth daily.       . fish oil-omega-3 fatty acids 1000 MG capsule Take 1 g by mouth daily.        Marland Kitchen levothyroxine (SYNTHROID) 75 MCG tablet Take 1 tablet (75 mcg total) by mouth daily.  30 tablet  3  . lisinopril (PRINIVIL,ZESTRIL) 10 MG tablet Take 1 tablet (10 mg total) by mouth daily.  30 tablet  0  . Probiotic Product (PROBIOTIC FORMULA PO) Take by mouth as needed.       . ValACYclovir HCl (VALTREX PO) Take by mouth.       No current facility-administered medications for this visit.     Allergies (verified) Benicar; Codeine; Hctz; Penicillins; Streptomycin; and Sulfonamide derivatives   PAST HISTORY  Family History Family History  Problem Relation Age of Onset  . Breast cancer Mother     Social History History  Substance Use Topics  . Smoking status: Never Smoker   . Smokeless tobacco: Not on file  . Alcohol Use: No     Are there smokers in your home (other than you)? No  Risk Factors Current exercise habits: walking some  Dietary issues discussed: none   Cardiac risk factors: advanced age (older than 40 for men, 38 for women).  Depression Screen (Note:  if answer to either of the following is "Yes", a more complete depression screening is indicated)   Over the past two weeks, have you felt down, depressed or hopeless? No  Over the past two weeks, have you felt little interest or pleasure in doing things? No  Have you lost interest or pleasure in daily life? No  Do you often feel hopeless? No  Do you cry easily over simple problems? No  Activities of Daily Living In your present state of health, do you have any difficulty performing the following activities?:  Driving? No Managing money?  No Feeding yourself? No Getting from bed to chair? No Climbing a flight of stairs? No Preparing food and eating?: No Bathing or showering? No Getting dressed: No Getting to the toilet? No Using  the toilet:No Moving around from place to place: No In the past year have you fallen or had a near fall?:No   Are you sexually active?  No  Do you have more than one partner?  No  Hearing Difficulties: No Do you often ask people to speak up or repeat themselves? No Do you experience ringing or noises in your ears? No Do you have difficulty understanding soft or whispered voices? No   Do you feel that you have a problem with memory? No  Do you often misplace items? No  Do you feel safe at home?  Yes  Cognitive Testing  Alert? Yes  Normal Appearance?Yes  Oriented to person? Yes  Place? Yes   Time? Yes  Recall of three objects?  Yes  Can perform simple calculations? Yes  Displays appropriate judgment?Yes  Can read the correct time from a watch face?Yes   Advanced Directives have been discussed with the patient? Yes  List the Names of Other Physician/Practitioners you currently use: 1.    Indicate any recent Medical Services you may have received from other than [facility] providers in the past year (date may be approximate).  Immunization History  Administered Date(s) Administered  . Influenza Split 05/28/2012  . Influenza Whole 05/21/2005, 05/19/2007, 04/27/2008, 03/29/2010, 03/14/2011  . Influenza,inj,Quad PF,36+ Mos 04/15/2013  . Pneumococcal Polysaccharide 06/12/2009  . Td 08/15/2010  . Zoster 03/29/2010    Screening Tests Health Maintenance  Topic Date Due  . Mammogram  05/25/2013  . Influenza Vaccine  02/18/2014  . Tetanus/tdap  08/15/2020  . Colonoscopy  03/03/2022  . Pneumococcal Polysaccharide Vaccine Age 13 And Over  Completed  . Zostavax  Completed    All answers were reviewed with the patient and necessary referrals were made:  METHENEY,CATHERINE, MD   05/03/2013   History reviewed: allergies, current medications, past family history, past medical history, past social history, past surgical history and problem list  Review of Systems A comprehensive  review of systems was negative.    Objective:     Vision by Snellen chart: plans to schedule  Body mass index is 23.77 kg/(m^2). BP 165/79  Pulse 73  Wt 130 lb (58.968 kg)  BMI 23.77 kg/m2  BP 140/80  Pulse 73  Wt 130 lb (58.968 kg)  BMI 23.77 kg/m2  General Appearance:    Alert, cooperative, no distress, appears stated age  Head:    Normocephalic, without obvious abnormality, atraumatic  Eyes:    PERRL, conjunctiva/corneas clear, EOM's intact, both eyes  Ears:    Normal TM's and external ear canals, both ears  Nose:   Nares normal, septum midline, mucosa normal, no drainage    or sinus tenderness  Throat:  Lips, mucosa, and tongue normal; teeth and gums normal  Neck:   Supple, symmetrical, trachea midline, no adenopathy;    thyroid:  no enlargement/tenderness/nodules; no carotid   bruit or JVD  Back:     Symmetric, no curvature, ROM normal, no CVA tenderness  Lungs:     Clear to auscultation bilaterally, respirations unlabored  Chest Wall:    No tenderness or deformity   Heart:    Regular rate and rhythm, S1 and S2 normal, no murmur, rub   or gallop  Breast Exam:    No tenderness, masses, or nipple abnormality, scars well healed.   Abdomen:     Soft, non-tender, bowel sounds active all four quadrants,    no masses, no organomegaly  Genitalia:    Not peformed  Rectal:    Not performed.   Extremities:   Extremities normal, atraumatic, no cyanosis or edema  Pulses:   2+ and symmetric all extremities  Skin:   Skin color, texture, turgor normal, no rashes or lesions  Lymph nodes:   Cervical, supraclavicular, and axillary nodes normal  Neurologic:   CNII-XII intact, normal strength, sensation and reflexes    throughout       Assessment:     Annual Medicare Exam      Plan:     During the course of the visit the patient was educated and counseled about appropriate screening and preventive services including:    Screening mammography - plans to schedule  HTN- ok to  cut lisinopril in half.  F/u in 3 months.   Reports due for eye exam with Dr. Hazle Quant. She plans to call and schedule this week.  URI - likely viral. Gave reassurance. Continue since Medicare. Call suddenly getting worse or not better in one week.  Diet review for nutrition referral? Yes ____  Not Indicated _x_   Patient Instructions (the written plan) was given to the patient.  Medicare Attestation I have personally reviewed: The patient's medical and social history Their use of alcohol, tobacco or illicit drugs Their current medications and supplements The patient's functional ability including ADLs,fall risks, home safety risks, cognitive, and hearing and visual impairment Diet and physical activities Evidence for depression or mood disorders  The patient's weight, height, BMI, and visual acuity have been recorded in the chart.  I have made referrals, counseling, and provided education to the patient based on review of the above and I have provided the patient with a written personalized care plan for preventive services.     METHENEY,CATHERINE, MD   05/03/2013

## 2013-05-03 NOTE — Patient Instructions (Addendum)
Called to make an appointment with Dr. Hazle Quant. Educated to lisinopril in half and take half a tab daily. If he liked or so experiencing side effects on the medication please let me know and we can try something else.  Keep up a regular exercise program and make sure you are eating a healthy diet Try to eat 4 servings of dairy a day, or if you are lactose intolerant take a calcium with vitamin D daily.  Your vaccines are up to date.

## 2013-05-04 LAB — COMPLETE METABOLIC PANEL WITH GFR
CO2: 27 mEq/L (ref 19–32)
Calcium: 9.1 mg/dL (ref 8.4–10.5)
Chloride: 105 mEq/L (ref 96–112)
Creat: 0.6 mg/dL (ref 0.50–1.10)
GFR, Est African American: >89 mL/min
GFR, Est Non African American: >89 mL/min
Glucose, Bld: 90 mg/dL (ref 70–99)
Sodium: 141 mEq/L (ref 135–145)
Total Bilirubin: 0.7 mg/dL (ref 0.3–1.2)
Total Protein: 6.4 g/dL (ref 6.0–8.3)

## 2013-05-04 LAB — LIPID PANEL
HDL: 53 mg/dL (ref >39)
LDL Cholesterol: 115 mg/dL — ABNORMAL HIGH (ref 0–99)

## 2013-05-04 LAB — TSH: TSH: 0.993 u[IU]/mL (ref 0.350–4.500)

## 2013-05-06 ENCOUNTER — Other Ambulatory Visit: Payer: Self-pay

## 2013-05-06 DIAGNOSIS — Z1231 Encounter for screening mammogram for malignant neoplasm of breast: Secondary | ICD-10-CM

## 2013-05-12 ENCOUNTER — Telehealth: Payer: Self-pay | Admitting: *Deleted

## 2013-05-12 NOTE — Telephone Encounter (Signed)
Pt called requesting a ref to ENT, Podiatry or sports med for bone spur.Kathleen [name]

## 2013-05-12 NOTE — Telephone Encounter (Signed)
Ok to schedule with Dr. T

## 2013-05-17 DIAGNOSIS — D1801 Hemangioma of skin and subcutaneous tissue: Secondary | ICD-10-CM | POA: Diagnosis not present

## 2013-05-17 DIAGNOSIS — L821 Other seborrheic keratosis: Secondary | ICD-10-CM | POA: Diagnosis not present

## 2013-05-17 DIAGNOSIS — L819 Disorder of pigmentation, unspecified: Secondary | ICD-10-CM | POA: Diagnosis not present

## 2013-05-17 DIAGNOSIS — L570 Actinic keratosis: Secondary | ICD-10-CM | POA: Diagnosis not present

## 2013-05-17 DIAGNOSIS — Z85828 Personal history of other malignant neoplasm of skin: Secondary | ICD-10-CM | POA: Diagnosis not present

## 2013-05-17 DIAGNOSIS — L259 Unspecified contact dermatitis, unspecified cause: Secondary | ICD-10-CM | POA: Diagnosis not present

## 2013-05-20 ENCOUNTER — Encounter: Payer: Self-pay | Admitting: Sports Medicine

## 2013-05-20 ENCOUNTER — Ambulatory Visit (INDEPENDENT_AMBULATORY_CARE_PROVIDER_SITE_OTHER): Payer: 59 | Admitting: Sports Medicine

## 2013-05-20 VITALS — BP 164/81 | HR 77 | Wt 132.0 lb

## 2013-05-20 DIAGNOSIS — IMO0002 Reserved for concepts with insufficient information to code with codable children: Secondary | ICD-10-CM | POA: Diagnosis not present

## 2013-05-20 DIAGNOSIS — M722 Plantar fascial fibromatosis: Secondary | ICD-10-CM | POA: Diagnosis not present

## 2013-05-20 DIAGNOSIS — M5416 Radiculopathy, lumbar region: Secondary | ICD-10-CM | POA: Insufficient documentation

## 2013-05-20 NOTE — Assessment & Plan Note (Signed)
We will start conservatively with rehabilitation exercises, custom orthotics, Aleve 2 tabs twice a day, and Tylenol 3 times a day. Return to see me for custom orthotics. If no better in one month we will consider an injection.

## 2013-05-20 NOTE — Assessment & Plan Note (Signed)
Noted when I pulled her foot up in a straight leg raise. This is clinically right L5. She does not desire to have this treated.

## 2013-05-20 NOTE — Progress Notes (Signed)
   Subjective:    I'm seeing this patient as a consultation for:  Dr. Nani Gasser  CC: Foot pain  HPI: This is a pleasant 72 year old female here for evaluation of her right foot pain that began about 3 months ago. The pain is moderate and persistent. It does not radiate. It is mostly around the right heel. It is worse in the morning, then improves. She is on her feet all day at work, and the pain is difficult to bear by the end of her shift. She has tried some Alegria shoes with inserts. She takes some Aleve for the pain, but does not want to try anything stronger. She also reports numbness on the top of her right foot but says it is not too bothersome right now.  Past medical history, Surgical history, Family history not pertinant except as noted below, Social history, Allergies, and medications have been entered into the medical record, reviewed, and no changes needed.   Review of Systems: No headache, visual changes, nausea, vomiting, diarrhea, constipation, dizziness, abdominal pain, skin rash, fevers, chills, night sweats, weight loss, swollen lymph nodes, body aches, joint swelling, muscle aches, chest pain, shortness of breath, mood changes, visual or auditory hallucinations.   Objective:   General: Well Developed, well nourished, and in no acute distress.  Neuro/Psych: Alert and oriented x3, extra-ocular muscles intact, able to move all 4 extremities, sensation grossly intact. Skin: Warm and dry, no rashes noted.  Respiratory: Not using accessory muscles, speaking in full sentences, trachea midline.  Cardiovascular: Pulses palpable, no extremity edema. Abdomen: Does not appear distended. Right Foot:  No visible erythema or swelling.  Range of motion is full in all directions.  Strength is 5/5 in all directions.  No hallux valgus.  No abnormal callus noted.  No pain over the navicular prominence, or base of fifth metatarsal.  Tender to palpation of the calcaneal insertion of  plantar fascia.  No pain at the Achilles insertion.  No pain over the calcaneal bursa.  No pain of the retrocalcaneal bursa.  No tenderness to palpation over the tarsals, metatarsals, or phalanges.  No hallux rigidus or limitus.  No tenderness palpation over interphalangeal joints.  No pain with compression of the metatarsal heads.  Neurovascularly intact distally.  X-ray of Right Foot: No acute fracture or subluxation. Small plantar spur of calcaneus.  Impression and Recommendations:   This case required medical decision making of moderate complexity.  Assessment: This is a 72 year old with plantar fasciitis of the right foot.  Plan: 1. Continue tylenol and aleve as needed for pain 2. Complete home PT exercises 3. Make an appointment for custom orthotics  This note was originally written by Karin Lieu MS3.

## 2013-05-23 DIAGNOSIS — R51 Headache: Secondary | ICD-10-CM | POA: Diagnosis not present

## 2013-05-23 DIAGNOSIS — I10 Essential (primary) hypertension: Secondary | ICD-10-CM | POA: Diagnosis not present

## 2013-05-23 DIAGNOSIS — T1490XA Injury, unspecified, initial encounter: Secondary | ICD-10-CM | POA: Diagnosis not present

## 2013-05-23 DIAGNOSIS — S064X9A Epidural hemorrhage with loss of consciousness of unspecified duration, initial encounter: Secondary | ICD-10-CM | POA: Diagnosis not present

## 2013-05-24 ENCOUNTER — Encounter: Payer: Self-pay | Admitting: Sports Medicine

## 2013-05-24 ENCOUNTER — Ambulatory Visit (INDEPENDENT_AMBULATORY_CARE_PROVIDER_SITE_OTHER): Payer: 59 | Admitting: Sports Medicine

## 2013-05-24 ENCOUNTER — Ambulatory Visit (INDEPENDENT_AMBULATORY_CARE_PROVIDER_SITE_OTHER): Payer: 59

## 2013-05-24 VITALS — BP 174/85 | HR 76 | Wt 128.0 lb

## 2013-05-24 DIAGNOSIS — S060X0A Concussion without loss of consciousness, initial encounter: Secondary | ICD-10-CM | POA: Diagnosis not present

## 2013-05-24 DIAGNOSIS — M722 Plantar fascial fibromatosis: Secondary | ICD-10-CM

## 2013-05-24 DIAGNOSIS — M533 Sacrococcygeal disorders, not elsewhere classified: Secondary | ICD-10-CM

## 2013-05-24 MED ORDER — MECLIZINE HCL 25 MG PO TABS: 25.0000 mg | ORAL_TABLET | Freq: Three times a day (TID) | ORAL | Status: DC | PRN

## 2013-05-24 NOTE — Assessment & Plan Note (Addendum)
After a fall from an 8 foot ladder yesterday. She is a negative CT of the head and cervical spine. She does have predominantly vestibular symptoms, adding meclizine, she will be out of work for one week, and I have encouraged complete physical and cognitive rash. I like to see her back next week, at that point we can also go for the custom orthotics. She does have some pain over her mid sacrum, I am going to obtain x-rays.

## 2013-05-24 NOTE — Assessment & Plan Note (Signed)
Patient was initially scheduled for custom orthotics today, but we used this visit is a concussion evaluation. She will return to see me next week for custom orthotics and concussion followup.

## 2013-05-24 NOTE — Patient Instructions (Signed)
Concussion and Brain Injury  A blow or jolt to the head can disrupt the normal function of the brain. This type of brain injury is often called a "concussion" or a "closed head injury." Concussions are usually not life-threatening. Even so, the effects of a concussion can be serious.   CAUSES   A concussion is caused by a blunt blow to the head. The blow might be direct or indirect as described below.  · Direct blow (running into another player during a soccer game, being hit in a fight, or hitting your head on a hard surface).  · Indirect blow (when your head moves rapidly and violently back and forth like in a car crash).  SYMPTOMS   The brain is very complex. Every head injury is different. Some symptoms may appear right away. Other symptoms may not show up for days or weeks after the concussion. The signs of concussion can be hard to notice. Early on, problems may be missed by patients, family members, and caregivers. You may look fine even though you are acting or feeling differently.   These symptoms are usually temporary, but may last for days, weeks, or even longer. Symptoms include:  · Mild headaches that will not go away.  · Having more trouble than usual with:  · Remembering things.  · Paying attention or concentrating.  · Organizing daily tasks.  · Making decisions and solving problems.  · Slowness in thinking, acting, speaking, or reading.  · Getting lost or easily confused.  · Feeling tired all the time or lacking energy (fatigue).  · Feeling drowsy.  · Sleep disturbances.  · Sleeping more than usual.  · Sleeping less than usual.  · Trouble falling asleep.  · Trouble sleeping (insomnia).  · Loss of balance or feeling lightheaded or dizzy.  · Nausea or vomiting.  · Numbness or tingling.  · Increased sensitivity to:  · Sounds.  · Lights.  · Distractions.  Other symptoms might include:  · Vision problems or eyes that tire easily.  · Diminished sense of taste or smell.  · Ringing in the ears.  · Mood  changes such as feeling sad, anxious, or listless.  · Becoming easily irritated or angry for little or no reason.  · Lack of motivation.  DIAGNOSIS   Your caregiver can usually diagnose a concussion or mild brain injury based on your description of your injury and your symptoms.   Your evaluation might include:  · A brain scan to look for signs of injury to the brain. Even if the test shows no injury, you may still have a concussion.  · Blood tests to be sure other problems are not present.  TREATMENT   · People with a concussion need to be examined and evaluated. Most people with concussions are treated in an emergency department, urgent care, or clinic. Some people must stay in the hospital overnight for further treatment.  · Your caregiver will send you home with important instructions to follow. Be sure to carefully follow them.  · Tell your caregiver if you are already taking any medicines (prescription, over-the-counter, or natural remedies), or if you are drinking alcohol or taking illegal drugs. Also, talk with your caregiver if you are taking blood thinners (anticoagulants) or aspirin. These drugs may increase your chances of complications. All of this is important information that may affect treatment.  · Only take over-the-counter or prescription medicines for pain, discomfort, or fever as directed by your caregiver.  PROGNOSIS     How fast people recover from brain injury varies from person to person. Although most people have a good recovery, how quickly they improve depends on many factors. These factors include how severe their concussion was, what part of the brain was injured, their age, and how healthy they were before the concussion.   Because all head injuries are different, so is recovery. Most people with mild injuries recover fully. Recovery can take time. In general, recovery is slower in older persons. Also, persons who have had a concussion in the past or have other medical problems may find  that it takes longer to recover from their current injury. Anxiety and depression may also make it harder to adjust to the symptoms of brain injury.  HOME CARE INSTRUCTIONS   Return to your normal activities slowly, not all at once. You must give your body and brain enough time for recovery.  · Get plenty of sleep at night, and rest during the day. Rest helps the brain to heal.  · Avoid staying up late at night.  · Keep the same bedtime hours on weekends and weekdays.  · Take daytime naps or rest breaks when you feel tired.  · Limit activities that require a lot of thought or concentration (brain or cognitive rest). This includes:  · Homework or job-related work.  · Watching TV.  · Computer work.  · Avoid activities that could lead to a second brain injury, such as contact or recreational sports, until your caregiver says it is okay. Even after your brain injury has healed, you should protect yourself from having another concussion.  · Ask your caregiver when you can return to your normal activities such as driving, bicycling, or operating heavy equipment. Your ability to react may be slower after a brain injury.  · Talk with your caregiver about when you can return to work or school.  · Inform your teachers, school nurse, school counselor, coach, athletic trainer, or work manager about your injury, symptoms, and restrictions. They should be instructed to report:  · Increased problems with attention or concentration.  · Increased problems remembering or learning new information.  · Increased time needed to complete tasks or assignments.  · Increased irritability or decreased ability to cope with stress.  · Increased symptoms.  · Take only those medicines that your caregiver has approved.  · Do not drink alcohol until your caregiver says you are well enough to do so. Alcohol and certain other drugs may slow your recovery and can put you at risk of further injury.  · If it is harder than usual to remember things,  write them down.  · If you are easily distracted, try to do one thing at a time. For example, do not try to watch TV while fixing dinner.  · Talk with family members or close friends when making important decisions.  · Keep all follow-up appointments. Repeated evaluation of your symptoms is recommended for your recovery.  PREVENTION   Protect your head from future injury. It is very important to avoid another head or brain injury before you have recovered. In rare cases, another injury has lead to permanent brain damage, brain swelling, or death. Avoid injuries by using:  · Seatbelts when riding in a car.  · Alcohol only in moderation.  · A helmet when biking, skiing, skateboarding, skating, or doing similar activities.  · Safety measures in your home.  · Remove clutter and tripping hazards from floors and stairways.  · Use grab   bars in bathrooms and handrails by stairs.  · Place non-slip mats on floors and in bathtubs.  · Improve lighting in dim areas.  SEEK MEDICAL CARE IF:   A head injury can cause lingering symptoms. You should seek medical care if you have any of the following symptoms for more than 3 weeks after your injury or are planning to return to sports:  · Chronic headaches.  · Dizziness or balance problems.  · Nausea.  · Vision problems.  · Increased sensitivity to noise or light.  · Depression or mood swings.  · Anxiety or irritability.  · Memory problems.  · Difficulty concentrating or paying attention.  · Sleep problems.  · Feeling tired all the time.  SEEK IMMEDIATE MEDICAL CARE IF:   You have had a blow or jolt to the head and you (or your family or friends) notice:  · Severe or worsening headaches.  · Weakness (even if only in one hand or one leg or one part of the face), numbness, or decreased coordination.  · Repeated vomiting.  · Increased sleepiness or passing out.  · One black center of the eye (pupil) is larger than the other.  · Convulsions (seizures).  · Slurred speech.  · Increasing  confusion, restlessness, agitation, or irritability.  · Lack of ability to recognize people or places.  · Neck pain.  · Difficulty being awakened.  · Unusual behavior changes.  · Loss of consciousness.  Older adults with a brain injury may have a higher risk of serious complications such as a blood clot on the brain. Headaches that get worse or an increase in confusion are signs of this complication. If these signs occur, see a caregiver right away.  MAKE SURE YOU:   · Understand these instructions.  · Will watch your condition.  · Will get help right away if you are not doing well or get worse.  FOR MORE INFORMATION   Several groups help people with brain injury and their families. They provide information and put people in touch with local resources. These include support groups, rehabilitation services, and a variety of health care professionals. Among these groups, the Brain Injury Association (BIA, www.biausa.org) has a national office that gathers scientific and educational information and works on a national level to help people with brain injury.   Document Released: 09/27/2003 Document Revised: 09/29/2011 Document Reviewed: 02/23/2008  [employer]® Patient Information ©2014 [employer].

## 2013-05-24 NOTE — Progress Notes (Signed)
  Subjective:    CC: Dizziness  HPI: Kathleen [name] is a pleasant 72 year old female with a history of vertigo who is here with dizziness after a concussion. She missed a step while climbing down from an 8-foot ladder yesterday at work, and fell on her back. She hit the back of her head but did not lose consciousness. She had some bleeding from the back of the head and was taken to [facility] by ambulance, where a head CT was negative and x-rays of her cervical spine ruled out fracture. Since the accident she continues to a headache, which she is treating with tylenol. She slept well last night but woke up with some dizziness. It bothers her when she turns her head. She is able to walk without assistance, but feels a little unsteady on her feet. She denies any nausea or vomiting. She also has some soreness in her tailbone.       Past medical history, Surgical history, Family history not pertinant except as noted below, Social history, Allergies, and medications have been entered into the medical record, reviewed, and no changes needed.   Review of Systems: No fevers, chills, night sweats, weight loss, chest pain, or shortness of breath.   Objective:    General: Well Developed, well nourished, and in no acute distress.  Neuro: Alert and oriented x3, extra-ocular muscles intact, sensation grossly intact. Coordination unremarkable. Difficulty with tandem and single-leg stance. Cranial nerves II through XII are intact. She does have a slight droop of the left eyelid which is old. HEENT: Normocephalic, atraumatic, pupils equal round reactive to light, neck supple, no masses, no lymphadenopathy, thyroid nonpalpable.  Skin: Warm and dry, no rashes. Cardiac: Regular rate and rhythm, no murmurs rubs or gallops, no lower extremity edema.  Respiratory: Clear to auscultation bilaterally. Not using accessory muscles, speaking in full sentences.  CT scan of the head as well as cervical spine are both  negative.  Impression and Recommendations:   Assessment: This is a 72 year old female with vestibular symptoms following a concussion.  Plan: 1. Meclizine as needed for dizziness 2. Continue tylenol as needed for headache 3. Cognitive rest - limit screen time 4. Physical rest - no work for at least 1 week 5. Return for follow-up in 1 week 6. Custom orthotics at next visit  This note was originally written by Kathleen [name] MS3.

## 2013-05-26 ENCOUNTER — Ambulatory Visit (INDEPENDENT_AMBULATORY_CARE_PROVIDER_SITE_OTHER): Payer: 59 | Admitting: Sports Medicine

## 2013-05-26 ENCOUNTER — Telehealth: Payer: Self-pay | Admitting: *Deleted

## 2013-05-26 VITALS — BP 146/79 | HR 87 | Wt 129.0 lb

## 2013-05-26 DIAGNOSIS — S060X0D Concussion without loss of consciousness, subsequent encounter: Secondary | ICD-10-CM

## 2013-05-26 DIAGNOSIS — R1032 Left lower quadrant pain: Secondary | ICD-10-CM | POA: Diagnosis not present

## 2013-05-26 DIAGNOSIS — R109 Unspecified abdominal pain: Secondary | ICD-10-CM

## 2013-05-26 LAB — POCT URINALYSIS DIPSTICK
Bilirubin, UA: NEGATIVE
Glucose, UA: NEGATIVE
Ketones, UA: NEGATIVE
Leukocytes, UA: NEGATIVE
Nitrite, UA: NEGATIVE
Protein, UA: NEGATIVE
Spec Grav, UA: 1.01
Urobilinogen, UA: 0.2
pH, UA: 6

## 2013-05-26 MED ORDER — CIPROFLOXACIN HCL 750 MG PO TABS: 750.0000 mg | ORAL_TABLET | Freq: Two times a day (BID) | ORAL | Status: DC

## 2013-05-26 MED ORDER — MELOXICAM 15 MG PO TABS: ORAL_TABLET | ORAL | Status: DC

## 2013-05-26 MED ORDER — METRONIDAZOLE 500 MG PO TABS: 500.0000 mg | ORAL_TABLET | Freq: Two times a day (BID) | ORAL | Status: AC

## 2013-05-26 NOTE — Telephone Encounter (Signed)
Yes, have her be here at 1:30 in the afternoon.

## 2013-05-26 NOTE — Progress Notes (Signed)
  Subjective:    CC: Abdominal pain  HPI: Concussion: Persistent dizziness is starting to improve significantly with meclizine, and physical and cognitive rest.  Left lower quadrant abdominal pain: Present for several days now, essentially since the fall. No dysuria, no frequency, no diarrhea, no nausea, vomiting, fevers, chills. Pain is localized, without radiation, moderate. It's worse when she tries to do a sit up. She tells me that she does have a history of cyclophosphamide exposure and persistent hematuria afterwards. She is seeing a urologist. She also has a history of diverticulitis.  Past medical history, Surgical history, Family history not pertinant except as noted below, Social history, Allergies, and medications have been entered into the medical record, reviewed, and no changes needed.   Review of Systems: No fevers, chills, night sweats, weight loss, chest pain, or shortness of breath.   Objective:    General: Well Developed, well nourished, and in no acute distress.  Neuro: Alert and oriented x3, extra-ocular muscles intact, sensation grossly intact.  HEENT: Normocephalic, atraumatic, pupils equal round reactive to light, neck supple, no masses, no lymphadenopathy, thyroid nonpalpable.  Skin: Warm and dry, no rashes. Cardiac: Regular rate and rhythm, no murmurs rubs or gallops, no lower extremity edema.  Respiratory: Clear to auscultation bilaterally. Not using accessory muscles, speaking in full sentences. Abdomen: Soft, tender to palpation in the left lower quadrant without rebound tenderness or guarding. Normal bowel sounds.  Urinalysis is negative with the exception of hematuria.  Impression and Recommendations:

## 2013-05-26 NOTE — Telephone Encounter (Signed)
Pt notified & scheduled.

## 2013-05-26 NOTE — Telephone Encounter (Signed)
Pt left a message stating that she is now experiencing abdominal pain on the left side.  She was seen on 11-4 for concussion after falling off a ladder.  She wants to know if she needs to be seen again.  Please advise.

## 2013-05-26 NOTE — Assessment & Plan Note (Signed)
Improving with meclizine and physical and cognitive rest.

## 2013-05-26 NOTE — Assessment & Plan Note (Signed)
Urinalysis is negative with the exception of hematuria which sounds to be chronic. She has been on cyclophosphamide, and likely has some hemorrhagic cystitis. Unfortunately she also has a history of diverticulitis, I am going to treat her for this as well as do a CT scan. Cipro and Flagyl for 14 days, CT of the abdomen and pelvis with IV and oral contrast. Labwork. Return to see me in approximately a week.

## 2013-05-26 NOTE — Telephone Encounter (Signed)
PA obtained for CT abd/Pelvis w/contrast.  Auth # [id number]. Meyer Cory, LPN

## 2013-05-27 LAB — CBC WITH DIFFERENTIAL/PLATELET
Basophils Absolute: 0 K/uL (ref 0.0–0.1)
Basophils Relative: 0 % (ref 0–1)
Eosinophils Absolute: 0.1 10*3/uL (ref 0.0–0.7)
Eosinophils Relative: 1 % (ref 0–5)
HCT: 41.1 % (ref 36.0–46.0)
Hemoglobin: 14.1 g/dL (ref 12.0–15.0)
Lymphocytes Relative: 24 % (ref 12–46)
Lymphs Abs: 2 10*3/uL (ref 0.7–4.0)
MCH: 30.5 pg (ref 26.0–34.0)
MCHC: 34.3 g/dL (ref 30.0–36.0)
MCV: 88.8 fL (ref 78.0–100.0)
Monocytes Absolute: 0.5 K/uL (ref 0.1–1.0)
Monocytes Relative: 6 % (ref 3–12)
Neutro Abs: 5.5 10*3/uL (ref 1.7–7.7)
Neutrophils Relative %: 69 % (ref 43–77)
Platelets: 291 10*3/uL (ref 150–400)
RBC: 4.63 MIL/uL (ref 3.87–5.11)
RDW: 13.4 % (ref 11.5–15.5)
WBC: 8.1 10*3/uL (ref 4.0–10.5)

## 2013-05-27 LAB — COMPREHENSIVE METABOLIC PANEL
ALT: 12 U/L (ref 0–35)
Albumin: 4.3 g/dL (ref 3.5–5.2)
CO2: 27 mEq/L (ref 19–32)
Glucose, Bld: 91 mg/dL (ref 70–99)
Potassium: 4.2 mEq/L (ref 3.5–5.3)
Sodium: 143 mEq/L (ref 135–145)
Total Bilirubin: 0.7 mg/dL (ref 0.3–1.2)
Total Protein: 7 g/dL (ref 6.0–8.3)

## 2013-05-27 LAB — COMPREHENSIVE METABOLIC PANEL WITH GFR
AST: 20 U/L (ref 0–37)
Alkaline Phosphatase: 74 U/L (ref 39–117)
BUN: 24 mg/dL — ABNORMAL HIGH (ref 6–23)
Calcium: 9.7 mg/dL (ref 8.4–10.5)
Chloride: 102 meq/L (ref 96–112)
Creat: 0.72 mg/dL (ref 0.50–1.10)

## 2013-05-27 LAB — LIPASE: Lipase: 16 U/L (ref 0–75)

## 2013-05-30 ENCOUNTER — Encounter (HOSPITAL_BASED_OUTPATIENT_CLINIC_OR_DEPARTMENT_OTHER): Payer: Self-pay

## 2013-05-30 ENCOUNTER — Ambulatory Visit (HOSPITAL_BASED_OUTPATIENT_CLINIC_OR_DEPARTMENT_OTHER)
Admission: RE | Admit: 2013-05-30 | Discharge: 2013-05-30 | Disposition: A | Discharge status: Home / Self Care | Payer: 59 | Source: Ambulatory Visit | Attending: Sports Medicine | Admitting: Sports Medicine

## 2013-05-30 DIAGNOSIS — K5732 Diverticulitis of large intestine without perforation or abscess without bleeding: Secondary | ICD-10-CM | POA: Insufficient documentation

## 2013-05-30 DIAGNOSIS — N281 Cyst of kidney, acquired: Secondary | ICD-10-CM | POA: Insufficient documentation

## 2013-05-30 DIAGNOSIS — R1032 Left lower quadrant pain: Secondary | ICD-10-CM | POA: Insufficient documentation

## 2013-05-30 DIAGNOSIS — M47817 Spondylosis without myelopathy or radiculopathy, lumbosacral region: Secondary | ICD-10-CM | POA: Diagnosis not present

## 2013-05-30 HISTORY — DX: Essential (primary) hypertension: I10

## 2013-05-30 MED ORDER — IOHEXOL 300 MG/ML  SOLN
100.0000 mL | Freq: Once | INTRAMUSCULAR | Status: AC | PRN
  Administered 2013-05-30: 100 mL via INTRAVENOUS

## 2013-05-31 ENCOUNTER — Encounter: Payer: Self-pay | Admitting: Sports Medicine

## 2013-05-31 ENCOUNTER — Ambulatory Visit (INDEPENDENT_AMBULATORY_CARE_PROVIDER_SITE_OTHER): Payer: 59 | Admitting: Sports Medicine

## 2013-05-31 ENCOUNTER — Telehealth: Payer: Self-pay

## 2013-05-31 VITALS — BP 139/74 | HR 82 | Wt 130.0 lb

## 2013-05-31 DIAGNOSIS — H6123 Impacted cerumen, bilateral: Secondary | ICD-10-CM

## 2013-05-31 DIAGNOSIS — R1032 Left lower quadrant pain: Secondary | ICD-10-CM

## 2013-05-31 DIAGNOSIS — S060X0D Concussion without loss of consciousness, subsequent encounter: Secondary | ICD-10-CM

## 2013-05-31 DIAGNOSIS — H6120 Impacted cerumen, unspecified ear: Secondary | ICD-10-CM

## 2013-05-31 DIAGNOSIS — M722 Plantar fascial fibromatosis: Secondary | ICD-10-CM | POA: Diagnosis not present

## 2013-05-31 DIAGNOSIS — H938X9 Other specified disorders of ear, unspecified ear: Secondary | ICD-10-CM

## 2013-05-31 NOTE — Telephone Encounter (Signed)
referal placed

## 2013-05-31 NOTE — Telephone Encounter (Signed)
Called patient she stated it is pressure. Lachlyn Vanderstelt,CMA

## 2013-05-31 NOTE — Progress Notes (Signed)
    Patient was fitted for a : standard, cushioned, semi-rigid orthotic. The orthotic was heated and afterward the patient stood on the orthotic blank positioned on the orthotic stand. The patient was positioned in subtalar neutral position and 10 degrees of ankle dorsiflexion in a weight bearing stance. After completion of molding, a stable base was applied to the orthotic blank. The blank was ground to a stable position for weight bearing. Size: 6 Base: Blue EVA Additional Posting and Padding: None The patient ambulated these, and they were very comfortable.  Indication: Cerumen impaction of the ear(s) Medical necessity statement: On physical examination, cerumen impairs clinically significant portions of the external auditory canal, and tympanic membrane. Noted obstructive, copious cerumen that cannot be removed without magnification and instrumentations requiring physician skills Consent: Discussed benefits and risks of procedure and verbal consent obtained Procedure: Patient was prepped for the procedure. Utilized an otoscope to assess and take note of the ear canal, the tympanic membrane, and the presence, amount, and placement of the cerumen. Gentle water irrigation and soft plastic curette was utilized to remove cerumen.  Post procedure examination: shows cerumen was completely removed. Patient tolerated procedure well. The patient is made aware that they may experience temporary vertigo, temporary hearing loss, and temporary discomfort. If these symptom last for more than 24 hours to call the clinic or proceed to the ED.  I spent 40 minutes with this patient, greater than 50% was face-to-face time counseling regarding the below diagnosis.

## 2013-05-31 NOTE — Assessment & Plan Note (Signed)
Orthotics as above. Return in one month for this, we can consider injection if no better. She has not been doing her exercises secondary to persistent concussive symptoms and vertigo.

## 2013-05-31 NOTE — Assessment & Plan Note (Addendum)
CT scan with oral and IV contrast was negative, this likely represents a muscle strain and will improve.

## 2013-05-31 NOTE — Telephone Encounter (Signed)
I am okay with referral, just let me know if it's for pain or pressure. I look back at the last couple of minutes I didn't see any documentation about her ear, though I do think she has talked to me about before. Please ask her, so I know what diagnosis code could be used for the referral.

## 2013-05-31 NOTE — Assessment & Plan Note (Signed)
A copious amount of cerumen was removed from both sides manually with a curette.

## 2013-05-31 NOTE — Assessment & Plan Note (Addendum)
Persistent vestibular symptoms. She does have bilateral cerumen impaction which was removed with curette. I hope that this improves her vestibular symptoms significantly, if no better, we can certainly consider vestibular rehabilitation in the next few weeks.

## 2013-05-31 NOTE — Telephone Encounter (Signed)
Patient called wants to know if she can get a referral to ENT for her ears. Rhonda Cunningham,CMA

## 2013-06-03 DIAGNOSIS — Z0289 Encounter for other administrative examinations: Secondary | ICD-10-CM

## 2013-06-07 ENCOUNTER — Ambulatory Visit
Admission: RE | Admit: 2013-06-07 | Discharge: 2013-06-07 | Disposition: A | Discharge status: Home / Self Care | Payer: 59 | Source: Ambulatory Visit

## 2013-06-07 DIAGNOSIS — Z1231 Encounter for screening mammogram for malignant neoplasm of breast: Secondary | ICD-10-CM

## 2013-06-28 ENCOUNTER — Ambulatory Visit: Payer: 59 | Admitting: Sports Medicine

## 2013-07-19 DIAGNOSIS — H2510 Age-related nuclear cataract, unspecified eye: Secondary | ICD-10-CM | POA: Diagnosis not present

## 2013-08-17 ENCOUNTER — Ambulatory Visit: Payer: 59 | Admitting: Family Medicine

## 2013-08-27 ENCOUNTER — Other Ambulatory Visit: Payer: Self-pay | Admitting: Family Medicine

## 2013-11-15 DIAGNOSIS — L908 Other atrophic disorders of skin: Secondary | ICD-10-CM | POA: Diagnosis not present

## 2013-11-15 DIAGNOSIS — L738 Other specified follicular disorders: Secondary | ICD-10-CM | POA: Diagnosis not present

## 2013-11-15 DIAGNOSIS — L678 Other hair color and hair shaft abnormalities: Secondary | ICD-10-CM | POA: Diagnosis not present

## 2013-11-15 DIAGNOSIS — Z85828 Personal history of other malignant neoplasm of skin: Secondary | ICD-10-CM | POA: Diagnosis not present

## 2013-11-15 DIAGNOSIS — L905 Scar conditions and fibrosis of skin: Secondary | ICD-10-CM | POA: Diagnosis not present

## 2013-11-15 DIAGNOSIS — L570 Actinic keratosis: Secondary | ICD-10-CM | POA: Diagnosis not present

## 2013-11-15 DIAGNOSIS — L821 Other seborrheic keratosis: Secondary | ICD-10-CM | POA: Diagnosis not present

## 2013-11-25 ENCOUNTER — Ambulatory Visit (INDEPENDENT_AMBULATORY_CARE_PROVIDER_SITE_OTHER): Payer: 59 | Admitting: Family Medicine

## 2013-11-25 ENCOUNTER — Telehealth: Payer: Self-pay | Admitting: Family Medicine

## 2013-11-25 ENCOUNTER — Encounter: Payer: Self-pay | Admitting: Family Medicine

## 2013-11-25 VITALS — BP 140/72 | HR 79 | Wt 136.0 lb

## 2013-11-25 DIAGNOSIS — I10 Essential (primary) hypertension: Secondary | ICD-10-CM

## 2013-11-25 DIAGNOSIS — R7301 Impaired fasting glucose: Secondary | ICD-10-CM

## 2013-11-25 DIAGNOSIS — R350 Frequency of micturition: Secondary | ICD-10-CM | POA: Diagnosis not present

## 2013-11-25 DIAGNOSIS — R011 Cardiac murmur, unspecified: Secondary | ICD-10-CM

## 2013-11-25 DIAGNOSIS — E039 Hypothyroidism, unspecified: Secondary | ICD-10-CM | POA: Diagnosis not present

## 2013-11-25 DIAGNOSIS — I341 Nonrheumatic mitral (valve) prolapse: Secondary | ICD-10-CM

## 2013-11-25 LAB — POCT URINALYSIS DIPSTICK
Bilirubin, UA: NEGATIVE
Glucose, UA: NEGATIVE
KETONES UA: NEGATIVE
LEUKOCYTES UA: NEGATIVE
Nitrite, UA: NEGATIVE
PROTEIN UA: NEGATIVE
Spec Grav, UA: 1.015
Urobilinogen, UA: 0.2
pH, UA: 6.5

## 2013-11-25 LAB — POCT GLYCOSYLATED HEMOGLOBIN (HGB A1C): Hemoglobin A1C: 5.9

## 2013-11-25 MED ORDER — SYNTHROID 75 MCG PO TABS: ORAL_TABLET | ORAL | Status: DC

## 2013-11-25 NOTE — Telephone Encounter (Signed)
Please call patient: This morning on exam her an extra heart sound. I want to cut back in her chart to see if I could find an old echocardiogram or ultrasound of the heart. I didn't see one. If she refers having one in the last couple years then please let me know so we can call and get a copy. Otherwise I would like to schedule her for an echocardiogram.

## 2013-11-25 NOTE — Addendum Note (Signed)
Addended by: Teddy Spike on: 11/25/2013 12:58 PM   Modules accepted: Orders

## 2013-11-25 NOTE — Progress Notes (Signed)
   Subjective:    Patient ID: Kathleen [name], female    DOB: October 05, 1940, 73 y.o.   MRN: [id number]  HPI Hypothyroidism-no recent skin or hair changes. No recent weight changes. No change in energy.   Lab Results  Component Value Date   TSH 0.993 05/03/2013   Hypertension- Pt denies chest pain, SOB, dizziness, or heart palpitations.  Taking meds as directed w/o problems.  Denies medication side effects. Home BPs well controlled. 134/74.   IFG - no inc thirst. Has had more frequent urination. Waking up 2-4 times at night to urinate.  Started a few months ago. Has been persistent. No dysuria or back pain. She does have chronic hematuria which is not new and has seen urology in the past for this.  Review of Systems     Objective:   Physical Exam  Constitutional: She is oriented to person, place, and time. She appears well-developed and well-nourished.  HENT:  Head: Normocephalic and atraumatic.  Neck: Neck supple. No thyromegaly present.  Cardiovascular: Normal rate, regular rhythm and normal heart sounds.   Pulmonary/Chest: Effort normal and breath sounds normal.  Lymphadenopathy:    She has no cervical adenopathy.  Neurological: She is alert and oriented to person, place, and time.  Skin: Skin is warm and dry.  Psychiatric: She has a normal mood and affect. Her behavior is normal.          Assessment & Plan:  Hypothyroidism- well controlled. F/U in 6 months.  Due to recheck level in 6 months.  Hypertension-repeat blood pressure well controlled. Continue current regimen. Followup in 6 months. Due for BMP today.  IFG - A1C is stable at 5.9.  F/U in 6 motnhs. She has gained about 6 pounds since I last saw her. We discussed strategies to work on diet. She really doesn't eat much but it's what she eats. Also recommend she get back on her regular fiber to help with her consistencies of stools. She's been having a lot of issues alternating between constipation and diarrhea recently.  She says she has not been using her fiber consistently. Also encourage regular exercise which is not really doing right now.  Urinary frequency - will check UA and see if UTI. If neg then consider starting a bladder medication. We discussed this as an option I encouraged her to think about it. Help with her frequency overnight.

## 2013-11-25 NOTE — Addendum Note (Signed)
Addended by: Teddy Spike on: 11/25/2013 02:37 PM   Modules accepted: Orders

## 2013-11-27 LAB — URINE CULTURE

## 2013-11-28 NOTE — Telephone Encounter (Signed)
Called and lvm informing pt. Asked for her to return call. Teddy Spike

## 2013-12-02 NOTE — Telephone Encounter (Signed)
I spoke with patient, in reference to the extra heart sound, she reports she has been diagnosed in her late 76's with Mitral Valve Prolapse. She would like a call back to see what Dr Madilyn Fireman thinks about this.   See last result note. She agrees to start on the bladder medication.

## 2013-12-02 NOTE — Telephone Encounter (Signed)
He could definitely be from the mitral valve prolapse. I think I would still like to consider getting an echocardiogram just to look at the valve and make sure it looks okay. Will place the order.

## 2013-12-28 ENCOUNTER — Telehealth: Payer: Self-pay | Admitting: *Deleted

## 2013-12-28 NOTE — Telephone Encounter (Signed)
No PA required for 2D echo.  Oscar La, LPN

## 2014-01-17 DIAGNOSIS — H2510 Age-related nuclear cataract, unspecified eye: Secondary | ICD-10-CM | POA: Diagnosis not present

## 2014-01-23 ENCOUNTER — Telehealth: Payer: Self-pay

## 2014-01-23 NOTE — Telephone Encounter (Signed)
The guidelines no longer recommend antibiotics for mitral valve prolapse.

## 2014-01-23 NOTE — Telephone Encounter (Signed)
Augustine called and stated that she is having dental work done tomorrow and wants to get an Rx for antibiotic./Quintan Saldivar,CMA

## 2014-01-24 NOTE — Telephone Encounter (Signed)
LMOM with information for pt./ Josiah Lobo, CMA

## 2014-01-25 ENCOUNTER — Telehealth: Payer: Self-pay | Admitting: Family Medicine

## 2014-01-25 NOTE — Telephone Encounter (Signed)
Call patient: Ultrasound shows overall normal function of the heart. Little bit of regurgitation of the aortic valve, tricuspid valve, and mitral valve. They actually did not see any prolapse of the mitral valve. The mild regurgitation is not concerning or worrisome. It is very mild and does not affect the overall functioning of the heart. But it can cause a murmur. No further evaluation needed.

## 2014-01-26 NOTE — Telephone Encounter (Signed)
Pt informed of results.Kathleen [name]  

## 2014-02-01 ENCOUNTER — Encounter: Payer: Self-pay | Admitting: Family Medicine

## 2014-02-07 ENCOUNTER — Encounter: Payer: Self-pay | Admitting: Family Medicine

## 2014-02-07 ENCOUNTER — Ambulatory Visit (INDEPENDENT_AMBULATORY_CARE_PROVIDER_SITE_OTHER): Payer: 59 | Admitting: Family Medicine

## 2014-02-07 VITALS — BP 160/78 | HR 81 | Temp 98.2°F | Wt 134.0 lb

## 2014-02-07 DIAGNOSIS — R3915 Urgency of urination: Secondary | ICD-10-CM

## 2014-02-07 DIAGNOSIS — M5430 Sciatica, unspecified side: Secondary | ICD-10-CM

## 2014-02-07 DIAGNOSIS — M5432 Sciatica, left side: Secondary | ICD-10-CM

## 2014-02-07 DIAGNOSIS — I10 Essential (primary) hypertension: Secondary | ICD-10-CM | POA: Diagnosis not present

## 2014-02-07 DIAGNOSIS — R300 Dysuria: Secondary | ICD-10-CM

## 2014-02-07 LAB — POCT URINALYSIS DIPSTICK
Bilirubin, UA: NEGATIVE
Glucose, UA: NEGATIVE
Nitrite, UA: NEGATIVE
PH UA: 6
PROTEIN UA: NEGATIVE
SPEC GRAV UA: 1.02
UROBILINOGEN UA: 0.2

## 2014-02-07 MED ORDER — MIRABEGRON ER 25 MG PO TB24: 25.0000 mg | ORAL_TABLET | Freq: Every day | ORAL | Status: DC

## 2014-02-07 NOTE — Progress Notes (Signed)
   Subjective:    Patient ID: Kathleen [name], female    DOB: 09-20-1940, 73 y.o.   MRN: [id number]  HPI Hypertension- Pt denies chest pain, SOB, dizziness, or heart palpitations.  Taking meds as directed w/o problems.  Denies medication side effects.    Still having urinary frequency.  She wakes up almost every 2 hours to urinate at night. She has some frequency during the day as well that seems to be aggravated at night. She outlined and read about the side effects of medications and is fearful about this. Seh also read about teh botox and says she doesn't want to go that route either.    Says having left sciatica.  Has been working long hours at work. Her leg was hurting so bad when she got off work. Painful to lift her leg.  Using Aleve BID - helps some. Pain occasionally radiates down past the knee it is on the lateral side of the leg. Starts in the buttock area. She does a lot of physical labor at work.   Review of Systems     Objective:   Physical Exam  Constitutional: She is oriented to person, place, and time. She appears well-developed and well-nourished.  HENT:  Head: Normocephalic and atraumatic.  Cardiovascular: Normal rate, regular rhythm and normal heart sounds.   Pulmonary/Chest: Effort normal and breath sounds normal.  Musculoskeletal:  Lumbar spine with normal flexion, oxygen, rotation right left, side. She's nontender of the lumbar spine. Nontender over the SI joints. Negative straight leg raise bilaterally. Hip, knee, ankle strength is 5 out of 5 bilaterally. Hip abduction strength is grade. She is somewhat tender over the greater trochanter. Also some pain in the low back and buttock area with external hip rotation.  Neurological: She is alert and oriented to person, place, and time.  Skin: Skin is warm and dry.  Psychiatric: She has a normal mood and affect. Her behavior is normal.          Assessment & Plan:  HTN- uncontrolled.  She does have a hx of white coat  hypertension as well.  Asked her to come back in 1 mo for recheck. Seh is also in pain today.   Left sided sciatica - based on her symptoms I really think she has left-sided sciatica. Could be coming from her low back or piriformis muscle. Would like to give her handout on stretches to do at home on her own. She will be off of work all of next week so this will give her some time to rest and do the stretches. Apply heating pad. Recommend adding Tylenol to her Aleve for pain control. She wants to hold off on prednisone at this time as it makes her swell. Her mother not to mix Motrin and Aleve but can't take Aleve and Tylenol safely.  OAB - recommend a trial of Myrbetriq 25 mg. She wants to wait until after her eye surgery next week to try. Take once a day. Apply to see if it helps with her nocturnal symptoms. We also discussed potential referral to urology to discuss nonmedication treatment options such as Botox injection et Ronney Asters.

## 2014-02-10 ENCOUNTER — Other Ambulatory Visit: Payer: Self-pay | Admitting: Family Medicine

## 2014-02-10 LAB — URINE CULTURE: Colony Count: >=100000

## 2014-02-10 MED ORDER — CIPROFLOXACIN HCL 500 MG PO TABS: 500.0000 mg | ORAL_TABLET | Freq: Two times a day (BID) | ORAL | Status: AC

## 2014-02-13 DIAGNOSIS — H2589 Other age-related cataract: Secondary | ICD-10-CM | POA: Diagnosis not present

## 2014-02-13 DIAGNOSIS — H2510 Age-related nuclear cataract, unspecified eye: Secondary | ICD-10-CM | POA: Diagnosis not present

## 2014-02-21 DIAGNOSIS — H35379 Puckering of macula, unspecified eye: Secondary | ICD-10-CM | POA: Diagnosis not present

## 2014-02-21 DIAGNOSIS — H35349 Macular cyst, hole, or pseudohole, unspecified eye: Secondary | ICD-10-CM | POA: Diagnosis not present

## 2014-02-28 ENCOUNTER — Other Ambulatory Visit: Payer: Self-pay | Admitting: Family Medicine

## 2014-02-28 ENCOUNTER — Encounter: Payer: Self-pay | Admitting: Sports Medicine

## 2014-02-28 ENCOUNTER — Ambulatory Visit (INDEPENDENT_AMBULATORY_CARE_PROVIDER_SITE_OTHER): Payer: 59 | Admitting: Sports Medicine

## 2014-02-28 VITALS — BP 174/86 | HR 77 | Ht 62.0 in | Wt 134.0 lb

## 2014-02-28 DIAGNOSIS — G5702 Lesion of sciatic nerve, left lower limb: Secondary | ICD-10-CM

## 2014-02-28 DIAGNOSIS — G5700 Lesion of sciatic nerve, unspecified lower limb: Secondary | ICD-10-CM | POA: Diagnosis not present

## 2014-02-28 MED ORDER — PREDNISONE 50 MG PO TABS: ORAL_TABLET | ORAL | Status: DC

## 2014-02-28 MED ORDER — GABAPENTIN 300 MG PO CAPS: ORAL_CAPSULE | ORAL | Status: DC

## 2014-02-28 NOTE — Assessment & Plan Note (Signed)
There is reproduction of pain with deep palpation in the sciatic notch. prednisone, gabapentin, formal physical therapy. Return in a month, ultrasound guided injection if no better.

## 2014-02-28 NOTE — Progress Notes (Addendum)
   Subjective:    I'm seeing this patient as a consultation for: Dr. Beatrice Lecher MD  CC: Left Hip/Leg pain  HPI: Patient is a 73 year old woman with history of lumbar disc protrusion who comes to the clinic today for several months of worsening leg pain. The pain is an 8 in intensity and begins in her left hip, but travels around the outside of the hip and down her leg into her foot, occasionally accompanied by numbness and tingling in her foot. She says the pain is worse with activity, and can't think of relieving factors. Going upstairs is particularly bad, going down is okay. Coughing, sneezing, forward bending do not aggravate her pain.   Past medical history, Surgical history, Family history not pertinant except as noted below, Social history, Allergies, and medications have been entered into the medical record, reviewed, and no changes needed.   Review of Systems: occasional vertigo when lying down or standing up.   Objective:   General: Well Developed, well nourished, and in no acute distress.  Neuro/Psych: Alert and oriented x3, extra-ocular muscles intact, able to move all 4 extremities, sensation grossly intact. Positive dix-halpike Skin: Warm and dry, no rashes noted.  Respiratory: Not using accessory muscles, speaking in full sentences, trachea midline.  Cardiovascular: Pulses palpable, no extremity edema. Abdomen: Does not appear distended. Back Exam:  Inspection: Unremarkable  Motion: Flexion 45 deg, Extension 45 deg, Side Bending to 45 deg bilaterally,  Rotation to 45 deg bilaterally  Palpable tenderness: None. Sensory change: Gross sensation intact to all lumbar and sacral dermatomes.  Reflexes: 2+ at both patellar tendons, 2+ at achilles tendons, Babinski's downgoing. Left Hip: ROM Flexion: 120 Deg, Extension: 100 Deg, Abduction: 45 Deg, Adduction: 45 Deg. IR and ER limited by pain Strength Flexion: 5/5, Extension: 5/5, Abduction: 4/5 (with reproduction of  symptoms), Adduction: 5/5 Pelvic alignment unremarkable to inspection and palpation. Greater trochanter with mild tenderness to palpation. Tenderness over piriformis. Reproduction of symtpoms with deep palpation of sciatic notch Pain and reproduction of symptoms with external rotation. No SI joint tenderness and normal minimal SI movement.  Old CT myelogram from 2004 was reviewed and does show multilevel degenerative disc disease with L3-L4 and L4-L5 central canal stenosis  Impression and Recommendations:    Patient's pain is in an L4/L5 distribution, but reproduction with rotation at hip, and lack of production with forward bending/valsalva is more suggestive of sciatic pathology than lumbar disc pathology. Reproduction with palpation of sciatic notch confirms. Referring her for physical therapy, and initiating prednisone and gabapentin for pain.  Patient also has signs/symptoms of BPPV and has been told that she has this before. She states that she is okay with it currently. She was asked to follow up if it became troublesome.  This case required medical decision making of moderate complexity.  I spent 40 minutes with this patient, greater than 50% of face-to-face time counseling regarding the above diagnosis.

## 2014-03-06 ENCOUNTER — Encounter: Payer: Self-pay | Admitting: Sports Medicine

## 2014-03-06 ENCOUNTER — Ambulatory Visit (INDEPENDENT_AMBULATORY_CARE_PROVIDER_SITE_OTHER): Payer: 59 | Admitting: Sports Medicine

## 2014-03-06 VITALS — BP 152/77 | HR 82 | Ht 62.0 in | Wt 135.0 lb

## 2014-03-06 DIAGNOSIS — G5702 Lesion of sciatic nerve, left lower limb: Secondary | ICD-10-CM

## 2014-03-06 DIAGNOSIS — G5700 Lesion of sciatic nerve, unspecified lower limb: Secondary | ICD-10-CM

## 2014-03-06 MED ORDER — AMITRIPTYLINE HCL 50 MG PO TABS: ORAL_TABLET | ORAL | Status: DC

## 2014-03-06 MED ORDER — IBUPROFEN 800 MG PO TABS: 800.0000 mg | ORAL_TABLET | Freq: Three times a day (TID) | ORAL | Status: DC | PRN

## 2014-03-06 NOTE — Assessment & Plan Note (Addendum)
Improved on prednisone, has not taken the gabapentin, afraid of the side effects. She is back to early, I do want her to do physical therapy, she does have some SI joint mediated pain, we can inject this in 4 weeks if continues to have pain. I'm also going to add ibuprofen 800 and amitriptyline. Certainly if no better after all of the above we should look for a pelvic stress fracture with MRI.

## 2014-03-06 NOTE — Progress Notes (Signed)
  Subjective:    CC: Recheck pain  HPI: Chenay returns, she has a presumptive diagnosis of left puriform syndrome and we saw her approximately a week ago. We prescribed prednisone and gabapentin as well as physical therapy, she felt good with the prednisone but has not yet done the gabapentin and tells me she does not want to, and has not yet done physical therapy. Pain is moderate, persistent, localized deep in the left buttock with radiation down the thigh.  Past medical history, Surgical history, Family history not pertinant except as noted below, Social history, Allergies, and medications have been entered into the medical record, reviewed, and no changes needed.   Review of Systems: No fevers, chills, night sweats, weight loss, chest pain, or shortness of breath.   Objective:    General: Well Developed, well nourished, and in no acute distress.  Neuro: Alert and oriented x3, extra-ocular muscles intact, sensation grossly intact.  HEENT: Normocephalic, atraumatic, pupils equal round reactive to light, neck supple, no masses, no lymphadenopathy, thyroid nonpalpable.  Skin: Warm and dry, no rashes. Cardiac: Regular rate and rhythm, no murmurs rubs or gallops, no lower extremity edema.  Respiratory: Clear to auscultation bilaterally. Not using accessory muscles, speaking in full sentences. Left Hip: ROM IR: 60 Deg, ER: 60 Deg, Flexion: 120 Deg, Extension: 100 Deg, Abduction: 45 Deg, Adduction: 45 Deg Strength IR: 5/5, ER: 5/5, Flexion: 5/5, Extension: 5/5, Abduction: 5/5, Adduction: 5/5 Pelvic alignment unremarkable to inspection and palpation. Standing hip rotation and gait without trendelenburg / unsteadiness. Greater trochanter without tenderness to palpation. Tender to palpation both over the piriformis and sacroiliac joints, pain is worse with palpation at sacroiliac joint on the left.  Impression and Recommendations:

## 2014-03-14 ENCOUNTER — Ambulatory Visit: Payer: 59

## 2014-03-14 ENCOUNTER — Ambulatory Visit: Payer: 59 | Attending: Sports Medicine | Admitting: Physical Therapy

## 2014-03-14 DIAGNOSIS — IMO0001 Reserved for inherently not codable concepts without codable children: Secondary | ICD-10-CM | POA: Diagnosis present

## 2014-03-14 DIAGNOSIS — M6281 Muscle weakness (generalized): Secondary | ICD-10-CM | POA: Insufficient documentation

## 2014-03-14 DIAGNOSIS — G5700 Lesion of sciatic nerve, unspecified lower limb: Secondary | ICD-10-CM | POA: Diagnosis not present

## 2014-03-14 DIAGNOSIS — M25559 Pain in unspecified hip: Secondary | ICD-10-CM | POA: Diagnosis not present

## 2014-03-21 ENCOUNTER — Ambulatory Visit: Payer: 59 | Attending: Sports Medicine | Admitting: Physical Therapy

## 2014-03-21 DIAGNOSIS — IMO0001 Reserved for inherently not codable concepts without codable children: Secondary | ICD-10-CM | POA: Insufficient documentation

## 2014-03-21 DIAGNOSIS — M6281 Muscle weakness (generalized): Secondary | ICD-10-CM | POA: Insufficient documentation

## 2014-03-21 DIAGNOSIS — M25559 Pain in unspecified hip: Secondary | ICD-10-CM | POA: Diagnosis not present

## 2014-03-21 DIAGNOSIS — G5700 Lesion of sciatic nerve, unspecified lower limb: Secondary | ICD-10-CM | POA: Diagnosis not present

## 2014-03-28 ENCOUNTER — Ambulatory Visit: Payer: 59 | Admitting: Sports Medicine

## 2014-03-28 ENCOUNTER — Ambulatory Visit: Payer: 59 | Admitting: Physical Therapy

## 2014-03-30 ENCOUNTER — Ambulatory Visit: Payer: 59 | Admitting: Physical Therapy

## 2014-04-04 ENCOUNTER — Ambulatory Visit: Payer: 59 | Admitting: Sports Medicine

## 2014-04-13 DIAGNOSIS — H35419 Lattice degeneration of retina, unspecified eye: Secondary | ICD-10-CM | POA: Diagnosis not present

## 2014-04-13 DIAGNOSIS — H35349 Macular cyst, hole, or pseudohole, unspecified eye: Secondary | ICD-10-CM | POA: Diagnosis not present

## 2014-04-13 DIAGNOSIS — H33329 Round hole, unspecified eye: Secondary | ICD-10-CM | POA: Diagnosis not present

## 2014-04-25 ENCOUNTER — Encounter: Payer: Self-pay | Admitting: Family Medicine

## 2014-04-25 ENCOUNTER — Ambulatory Visit (INDEPENDENT_AMBULATORY_CARE_PROVIDER_SITE_OTHER): Payer: 59 | Admitting: Family Medicine

## 2014-04-25 VITALS — BP 130/78 | HR 80 | Temp 98.0°F | Ht 62.0 in | Wt 129.0 lb

## 2014-04-25 DIAGNOSIS — E039 Hypothyroidism, unspecified: Secondary | ICD-10-CM

## 2014-04-25 DIAGNOSIS — R030 Elevated blood-pressure reading, without diagnosis of hypertension: Secondary | ICD-10-CM

## 2014-04-25 DIAGNOSIS — Z23 Encounter for immunization: Secondary | ICD-10-CM

## 2014-04-25 DIAGNOSIS — Z0000 Encounter for general adult medical examination without abnormal findings: Secondary | ICD-10-CM

## 2014-04-25 DIAGNOSIS — IMO0001 Reserved for inherently not codable concepts without codable children: Secondary | ICD-10-CM

## 2014-04-25 DIAGNOSIS — R7301 Impaired fasting glucose: Secondary | ICD-10-CM

## 2014-04-25 NOTE — Progress Notes (Signed)
Subjective:    Kathleen [name] is a 73 y.o. female who presents for Medicare Annual/Subsequent preventive examination.  Preventive Screening-Counseling & Management  Tobacco History  Smoking status  . Never Smoker   Smokeless tobacco  . Not on file     Problems Prior to Visit 1. Had vitrectomy done Still has "bubble" in her eye.   Current Problems (verified) Patient Active Problem List   Diagnosis Date Noted  . Abdominal pain, left lower quadrant 05/26/2013  . Concussion with no loss of consciousness 05/24/2013  . Plantar fasciitis, right 05/20/2013  . Right lumbar radiculitis 05/20/2013  . History of breast cancer 11/09/2012  . Piriformis syndrome of left side 08/15/2010  . HEMATURIA UNSPECIFIED 07/05/2010  . POSTMENOPAUSAL STATUS 03/29/2010  . IMPAIRED FASTING GLUCOSE 12/11/2008  . LEG PAIN, BILATERAL 01/20/2008  . SHOULDER PAIN, LEFT 01/05/2008  . HIP PAIN, RIGHT 01/05/2008  . OSTEOPENIA 02/26/2007  . HYPOTHYROIDISM, UNSPECIFIED 04/28/2006  . HYPERTENSION, BENIGN SYSTEMIC 04/28/2006  . MITRAL VALVE DISORDER 04/28/2006    Medications Prior to Visit Current Outpatient Prescriptions on File Prior to Visit  Medication Sig Dispense Refill  . B-Complex TABS Take 1 tab by mouth daily.       . fish oil-omega-3 fatty acids 1000 MG capsule Take 1 g by mouth daily.        Marland Kitchen ibuprofen (ADVIL,MOTRIN) 800 MG tablet Take 1 tablet (800 mg total) by mouth every 8 (eight) hours as needed.  60 tablet  2  . ValACYclovir HCl (VALTREX PO) Take by mouth.      Marland Kitchen amitriptyline (ELAVIL) 50 MG tablet One half tab PO qHS for a week, then one tab PO qHS.  90 tablet  3  . lisinopril (PRINIVIL,ZESTRIL) 10 MG tablet Take 1 tablet (10 mg total) by mouth daily.  30 tablet  0  . mirabegron ER (MYRBETRIQ) 25 MG TB24 tablet Take 1 tablet (25 mg total) by mouth daily.  14 tablet  0  . SYNTHROID 75 MCG tablet TAKE ONE (1) TABLET BY MOUTH EVERY DAY  30 tablet  1   No current facility-administered  medications on file prior to visit.    Current Medications (verified) Current Outpatient Prescriptions  Medication Sig Dispense Refill  . B-Complex TABS Take 1 tab by mouth daily.       . fish oil-omega-3 fatty acids 1000 MG capsule Take 1 g by mouth daily.        Marland Kitchen ibuprofen (ADVIL,MOTRIN) 800 MG tablet Take 1 tablet (800 mg total) by mouth every 8 (eight) hours as needed.  60 tablet  2  . predniSONE 5 MG/ML concentrated solution Place 1 mg/kg into feeding tube daily with breakfast.      . ValACYclovir HCl (VALTREX PO) Take by mouth.      Marland Kitchen amitriptyline (ELAVIL) 50 MG tablet One half tab PO qHS for a week, then one tab PO qHS.  90 tablet  3  . lisinopril (PRINIVIL,ZESTRIL) 10 MG tablet Take 1 tablet (10 mg total) by mouth daily.  30 tablet  0  . mirabegron ER (MYRBETRIQ) 25 MG TB24 tablet Take 1 tablet (25 mg total) by mouth daily.  14 tablet  0  . SYNTHROID 75 MCG tablet TAKE ONE (1) TABLET BY MOUTH EVERY DAY  30 tablet  1   No current facility-administered medications for this visit.     Allergies (verified) Benicar; Codeine; Hctz; Penicillins; Streptomycin; and Sulfonamide derivatives   PAST HISTORY  Family History Family History  Problem  Relation Age of Onset  . Breast cancer Mother     Social History History  Substance Use Topics  . Smoking status: Never Smoker   . Smokeless tobacco: Not on file  . Alcohol Use: No     Are there smokers in your home (other than you)? No  Risk Factors Current exercise habits: The patient does not participate in regular exercise at present.  Dietary issues discussed: None   Cardiac risk factors: advanced age (older than 46 for men, 53 for women).  Depression Screen (Note: if answer to either of the following is "Yes", a more complete depression screening is indicated)   Over the past two weeks, have you felt down, depressed or hopeless? No  Over the past two weeks, have you felt little interest or pleasure in doing things?  No  Have you lost interest or pleasure in daily life? No  Do you often feel hopeless? No  Do you cry easily over simple problems? No  Activities of Daily Living In your present state of health, do you have any difficulty performing the following activities?:  Driving? No Managing money?  No Feeding yourself? No Getting from bed to chair? No  Climbing a flight of stairs? No Preparing food and eating?: No Bathing or showering? No Getting dressed: No Getting to the toilet? No Using the toilet:No Moving around from place to place: No In the past year have you fallen or had a near fall?:Yes, mistep on ladder at work    Are you sexually active?  Yes  Do you have more than one partner?  No  Hearing Difficulties: No Do you often ask people to speak up or repeat themselves? No Do you experience ringing or noises in your ears? No Do you have difficulty understanding soft or whispered voices? No   Do you feel that you have a problem with memory? No  Do you often misplace items? No  Do you feel safe at home?  Yes  Cognitive Testing  Alert? Yes  Normal Appearance?Yes  Oriented to person? Yes  Place? Yes   Time? Yes  Recall of three objects?  Yes  Can perform simple calculations? Yes  Displays appropriate judgment?Yes  Can read the correct time from a watch face?Yes   6 CIT score of 0 ( normal).     Advanced Directives have been discussed with the patient? Yes  List the Names of Other Physician/Practitioners you currently use: 1.  [facility].   Indicate any recent Medical Services you may have received from other than [facility] providers in the past year (date may be approximate).  Immunization History  Administered Date(s) Administered  . Influenza Split 05/28/2012  . Influenza Whole 05/21/2005, 05/19/2007, 04/27/2008, 03/29/2010, 03/14/2011  . Influenza,inj,Quad PF,36+ Mos 04/15/2013  . Pneumococcal Polysaccharide-23 06/12/2009  . Td 08/15/2010  . Zoster 03/29/2010     Screening Tests Health Maintenance  Topic Date Due  . Influenza Vaccine  02/18/2014  . Mammogram  06/07/2014  . Tetanus/tdap  08/15/2020  . Colonoscopy  03/03/2022  . Pneumococcal Polysaccharide Vaccine Age 59 And Over  Completed  . Zostavax  Completed    All answers were reviewed with the patient and necessary referrals were made:  Marjorie Lussier, MD   04/25/2014   History reviewed: allergies, current medications, past family history, past medical history, past social history, past surgical history and problem list  Review of Systems A comprehensive review of systems was negative.    Objective:  Vision by Snellen chart: last eye exam 3 weeks ago.  [facility] [place].   Body mass index is 23.59 kg/(m^2). BP 165/85  Pulse 80  Temp(Src) 98 F (36.7 C)  Ht 5\' 2"  (1.575 m)  Wt 129 lb (58.514 kg)  BMI 23.59 kg/m2  BP 165/85  Pulse 80  Temp(Src) 98 F (36.7 C)  Ht 5\' 2"  (1.575 m)  Wt 129 lb (58.514 kg)  BMI 23.59 kg/m2 General appearance: alert, cooperative and appears stated age Head: Normocephalic, without obvious abnormality, atraumatic Eyes: conj claer, EOMi, PEERLA Ears: normal TM's and external ear canals both ears Nose: Nares normal. Septum midline. Mucosa normal. No drainage or sinus tenderness. Throat: lips, mucosa, and tongue normal; teeth and gums normal Neck: no adenopathy, no carotid bruit, no JVD, supple, symmetrical, trachea midline and thyroid not enlarged, symmetric, no tenderness/mass/nodules Back: symmetric, no curvature. ROM normal. No CVA tenderness. Lungs: clear to auscultation bilaterally Breasts: normal appearance, no masses or tenderness, tattoo markers on the left breast.  Heart: regular rate and rhythm, S1, S2 normal, no murmur, click, rub or gallop Abdomen: soft, non-tender; bowel sounds normal; no masses,  no organomegaly Extremities: extremities normal, atraumatic, no cyanosis or edema Pulses: 2+ and symmetric Skin: Skin  color, texture, turgor normal. No rashes or lesions Lymph nodes: Cervical, supraclavicular, and axillary nodes normal. Neurologic: Alert and oriented X 3, normal strength and tone. Normal symmetric reflexes. Normal coordination and gait     Assessment:     Medicare Wellness Exam       Plan:     During the course of the visit the patient was educated and counseled about appropriate screening and preventive services including:    Pneumococcal vaccine   Screening mammography  Advanced directives: has an advanced directive - a copy HAS NOT been provided.  Diet review for nutrition referral? Yes ____  Not Indicated _X__   Patient Instructions (the written plan) was given to the patient.  Medicare Attestation I have personally reviewed: The patient's medical and social history Their use of alcohol, tobacco or illicit drugs Their current medications and supplements The patient's functional ability including ADLs,fall risks, home safety risks, cognitive, and hearing and visual impairment Diet and physical activities Evidence for depression or mood disorders  The patient's weight, height, BMI, and visual acuity have been recorded in the chart.  I have made referrals, counseling, and provided education to the patient based on review of the above and I have provided the patient with a written personalized care plan for preventive services.     Jack Mineau, MD   04/25/2014

## 2014-04-25 NOTE — Patient Instructions (Signed)
Keep up a regular exercise program and make sure you are eating a healthy diet Try to eat 4 servings of dairy a day, or if you are lactose intolerant take a calcium with vitamin D daily.  Your vaccines are up to date.   

## 2014-04-25 NOTE — Addendum Note (Signed)
Addended by: Narda Rutherford on: 04/25/2014 11:28 AM   Modules accepted: Orders

## 2014-04-26 LAB — TSH: TSH: 0.586 u[IU]/mL (ref 0.350–4.500)

## 2014-04-26 LAB — COMPLETE METABOLIC PANEL WITH GFR
ALK PHOS: 64 U/L (ref 39–117)
ALT: 11 U/L (ref 0–35)
AST: 19 U/L (ref 0–37)
Albumin: 4.5 g/dL (ref 3.5–5.2)
BILIRUBIN TOTAL: 0.7 mg/dL (ref 0.2–1.2)
BUN: 18 mg/dL (ref 6–23)
CO2: 28 meq/L (ref 19–32)
CREATININE: 0.67 mg/dL (ref 0.50–1.10)
Calcium: 9.6 mg/dL (ref 8.4–10.5)
Chloride: 104 mEq/L (ref 96–112)
GFR, EST NON AFRICAN AMERICAN: 87 mL/min
Glucose, Bld: 93 mg/dL (ref 70–99)
Potassium: 3.8 mEq/L (ref 3.5–5.3)
Sodium: 140 mEq/L (ref 135–145)
Total Protein: 7 g/dL (ref 6.0–8.3)

## 2014-04-26 LAB — LIPID PANEL
CHOL/HDL RATIO: 4.2 ratio
Cholesterol: 221 mg/dL — ABNORMAL HIGH (ref 0–200)
HDL: 53 mg/dL (ref >39)
LDL Cholesterol: 141 mg/dL — ABNORMAL HIGH (ref 0–99)
TRIGLYCERIDES: 135 mg/dL (ref <150)
VLDL: 27 mg/dL (ref 0–40)

## 2014-04-26 LAB — HEMOGLOBIN A1C
Hgb A1c MFr Bld: 5.9 % — ABNORMAL HIGH (ref <5.7)
Mean Plasma Glucose: 123 mg/dL — ABNORMAL HIGH (ref <117)

## 2014-05-23 ENCOUNTER — Ambulatory Visit: Payer: 59 | Admitting: Physical Therapy

## 2014-05-23 DIAGNOSIS — L821 Other seborrheic keratosis: Secondary | ICD-10-CM | POA: Diagnosis not present

## 2014-05-23 DIAGNOSIS — D492 Neoplasm of unspecified behavior of bone, soft tissue, and skin: Secondary | ICD-10-CM | POA: Diagnosis not present

## 2014-05-23 DIAGNOSIS — Z85828 Personal history of other malignant neoplasm of skin: Secondary | ICD-10-CM | POA: Diagnosis not present

## 2014-05-30 ENCOUNTER — Ambulatory Visit: Payer: 59 | Admitting: Family Medicine

## 2014-06-02 ENCOUNTER — Other Ambulatory Visit: Payer: Self-pay | Admitting: Family Medicine

## 2014-06-20 ENCOUNTER — Other Ambulatory Visit: Payer: Self-pay | Admitting: Family Medicine

## 2014-06-20 ENCOUNTER — Other Ambulatory Visit: Payer: Self-pay

## 2014-06-20 DIAGNOSIS — Z1231 Encounter for screening mammogram for malignant neoplasm of breast: Secondary | ICD-10-CM

## 2014-07-04 DIAGNOSIS — C44519 Basal cell carcinoma of skin of other part of trunk: Secondary | ICD-10-CM | POA: Diagnosis not present

## 2014-07-06 ENCOUNTER — Ambulatory Visit
Admission: RE | Admit: 2014-07-06 | Discharge: 2014-07-06 | Disposition: A | Discharge status: Home / Self Care | Payer: 59 | Source: Ambulatory Visit

## 2014-07-06 DIAGNOSIS — Z1231 Encounter for screening mammogram for malignant neoplasm of breast: Secondary | ICD-10-CM

## 2014-07-11 DIAGNOSIS — D0439 Carcinoma in situ of skin of other parts of face: Secondary | ICD-10-CM | POA: Diagnosis not present

## 2014-08-22 ENCOUNTER — Ambulatory Visit (INDEPENDENT_AMBULATORY_CARE_PROVIDER_SITE_OTHER): Payer: 59 | Admitting: Family Medicine

## 2014-08-22 ENCOUNTER — Encounter: Payer: Self-pay | Admitting: Family Medicine

## 2014-08-22 VITALS — BP 149/72 | HR 84 | Ht 62.0 in | Wt 129.0 lb

## 2014-08-22 DIAGNOSIS — R319 Hematuria, unspecified: Secondary | ICD-10-CM

## 2014-08-22 DIAGNOSIS — H6123 Impacted cerumen, bilateral: Secondary | ICD-10-CM

## 2014-08-22 DIAGNOSIS — F430 Acute stress reaction: Secondary | ICD-10-CM

## 2014-08-22 DIAGNOSIS — IMO0001 Reserved for inherently not codable concepts without codable children: Secondary | ICD-10-CM

## 2014-08-22 DIAGNOSIS — F439 Reaction to severe stress, unspecified: Secondary | ICD-10-CM

## 2014-08-22 DIAGNOSIS — R030 Elevated blood-pressure reading, without diagnosis of hypertension: Secondary | ICD-10-CM | POA: Diagnosis not present

## 2014-08-22 DIAGNOSIS — R3915 Urgency of urination: Secondary | ICD-10-CM | POA: Diagnosis not present

## 2014-08-22 MED ORDER — AMLODIPINE BESYLATE 2.5 MG PO TABS: 2.5000 mg | ORAL_TABLET | Freq: Every day | ORAL | Status: DC

## 2014-08-22 NOTE — Progress Notes (Signed)
   Subjective:    Patient ID: Kathleen [name], female    DOB: 06-May-1941, 74 y.o.   MRN: [id number]  HPI Has been under a lot of stress. Her daughter is getting divoced and moving in with her.  Home BP has been running high.  In fact one day she came home from work and her systolic blood pressure was around 190.  She has been off lisinopril for a year for lightheadedness.  No CP or HA.  No lightheadedness recently.  Has had some urinary freqeuncy. No dysuria.  She has persistent hematuria which is well-known. No fevers chills or sweats or back pain with it.  She did want to let me know that she's fallen twice recently at work. 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 Someone accidentally hit her with a box and knocked her over. She landed on her right hip and bruised it but she says she's been feeling fine and it has healed well. She then slipped on the ice and ice storm that we had about a week ago. Again she is doing well and has not any persistent symptoms since then.     Review of Systems     Objective:   Physical Exam  Constitutional: She is oriented to person, place, and time. She appears well-developed and well-nourished.  HENT:  Head: Normocephalic and atraumatic.  Right Ear: External ear normal.  Left Ear: External ear normal.  Nose: Nose normal.  Mouth/Throat: Oropharynx is clear and moist.  Bilateral canals are blocked by cerumen.  Eyes: Conjunctivae and EOM are normal. Pupils are equal, round, and reactive to light.  Neck: Neck supple. No thyromegaly present.  Cardiovascular: Normal rate, regular rhythm and normal heart sounds.   Pulmonary/Chest: Effort normal and breath sounds normal. She has no wheezes.  Lymphadenopathy:    She has no cervical adenopathy.  Neurological: She is alert and oriented to person, place, and time.  Skin: Skin is warm and dry.  Psychiatric: She has a normal mood and affect.          Assessment & Plan:  HTN- not well controlled.  I really  do think stress is a major contributor at this point in time. We also discussed the DASH diet. She would like to work on this first before starting a blood pressure pills that she has had problems in the past with several agents. We will see her back in 6 weeks. If she's not well controlled at that time then we will need to consider starting another medication such as amlodipine 2.5 mg daily. Next  Urinary frequency-urinalysis performed. Pos for blood, this is chronic. Will send for culture.   Cerumen impaction-irrigation performed.  Acute stress-Discussed working on lowering stress levels. I do think this is been contributing to her elevated blood pressure levels though.

## 2014-08-24 LAB — URINE CULTURE: Colony Count: >=100000

## 2014-08-25 ENCOUNTER — Other Ambulatory Visit: Payer: Self-pay | Admitting: *Deleted

## 2014-08-25 ENCOUNTER — Other Ambulatory Visit: Payer: Self-pay | Admitting: Family Medicine

## 2014-08-25 MED ORDER — NITROFURANTOIN MACROCRYSTAL 100 MG PO CAPS: 100.0000 mg | ORAL_CAPSULE | Freq: Two times a day (BID) | ORAL | Status: DC

## 2014-10-03 ENCOUNTER — Ambulatory Visit: Payer: Medicare Other | Admitting: Family Medicine

## 2014-10-04 ENCOUNTER — Other Ambulatory Visit: Payer: Self-pay | Admitting: Family Medicine

## 2014-10-05 ENCOUNTER — Telehealth: Payer: Self-pay

## 2014-10-05 NOTE — Telephone Encounter (Signed)
Patient called stating that she "still has a bladder infection". She also mentioned hematuria. She wants to know if you would send her a prescription for a antibiotic. I mentioned that she may need an appointment. Please advise. Darla Lesches, [place]

## 2014-10-05 NOTE — Telephone Encounter (Signed)
Ok to place on nurse schedule and follow protocol.

## 2014-10-06 ENCOUNTER — Ambulatory Visit (INDEPENDENT_AMBULATORY_CARE_PROVIDER_SITE_OTHER): Payer: 59 | Admitting: Family Medicine

## 2014-10-06 VITALS — BP 159/78 | HR 71 | Temp 98.0°F

## 2014-10-06 DIAGNOSIS — R109 Unspecified abdominal pain: Secondary | ICD-10-CM

## 2014-10-06 DIAGNOSIS — N3000 Acute cystitis without hematuria: Secondary | ICD-10-CM

## 2014-10-06 DIAGNOSIS — R350 Frequency of micturition: Secondary | ICD-10-CM | POA: Diagnosis not present

## 2014-10-06 LAB — POCT URINALYSIS DIPSTICK
BILIRUBIN UA: NEGATIVE
Glucose, UA: NEGATIVE
KETONES UA: NEGATIVE
LEUKOCYTES UA: NEGATIVE
Nitrite, UA: NEGATIVE
Protein, UA: NEGATIVE
SPEC GRAV UA: 1.025
Urobilinogen, UA: 0.2
pH, UA: 6

## 2014-10-06 MED ORDER — CIPROFLOXACIN HCL 500 MG PO TABS: 500.0000 mg | ORAL_TABLET | Freq: Two times a day (BID) | ORAL | Status: AC

## 2014-10-06 NOTE — Progress Notes (Signed)
   Subjective:    Patient ID: Kathleen [name], female    DOB: 1940-08-31, 74 y.o.   MRN: [id number]  HPI  Kathleen [name] complains of pelvic discomfort, flank pain and frequent urinaiton for more than a month, worse over the last 2 weeks. Patient reports recent antibiotic use, no recent catheterization. She was seen by Dr Madilyn Fireman on 08/25/2014 for UTI and treated with Macrodantin. Patient has not taken Azo. Denies flank pain, fever, chills or sweats.   Review of Systems     Objective:   Physical Exam        Assessment & Plan:  Frequent urination/flank pain - U/A positive - moderate blood - Urine Culture pending.    UTI-we'll treat with Cipro. Will adjust the regimen if needed based on culture results. Patient to call if getting worse or develops a fever or pain increases. Beatrice Lecher, MD

## 2014-10-06 NOTE — Progress Notes (Signed)
Left detailed message.   

## 2014-10-08 LAB — URINE CULTURE
Colony Count: NO GROWTH
Organism ID, Bacteria: NO GROWTH

## 2014-10-11 ENCOUNTER — Telehealth: Payer: Self-pay | Admitting: Family Medicine

## 2014-10-11 NOTE — Telephone Encounter (Signed)
Tonya called patient and LM on 10/10/14.  Patient called into Triage this morning returning phone call.  Gave patient lab results and ask about her symptoms.  Patient stated she is doing much better, has no symptoms at the current time.  Told her to call back if the symptoms returned.  WB

## 2014-11-22 DIAGNOSIS — L821 Other seborrheic keratosis: Secondary | ICD-10-CM | POA: Diagnosis not present

## 2014-11-22 DIAGNOSIS — Z85828 Personal history of other malignant neoplasm of skin: Secondary | ICD-10-CM | POA: Diagnosis not present

## 2014-11-22 DIAGNOSIS — L570 Actinic keratosis: Secondary | ICD-10-CM | POA: Diagnosis not present

## 2014-11-22 DIAGNOSIS — D1801 Hemangioma of skin and subcutaneous tissue: Secondary | ICD-10-CM | POA: Diagnosis not present

## 2014-11-22 DIAGNOSIS — D485 Neoplasm of uncertain behavior of skin: Secondary | ICD-10-CM | POA: Diagnosis not present

## 2015-03-23 ENCOUNTER — Ambulatory Visit (INDEPENDENT_AMBULATORY_CARE_PROVIDER_SITE_OTHER): Payer: 59 | Admitting: Osteopathic Medicine

## 2015-03-23 ENCOUNTER — Encounter: Payer: Self-pay | Admitting: Osteopathic Medicine

## 2015-03-23 VITALS — BP 175/82 | HR 75 | Wt 130.0 lb

## 2015-03-23 DIAGNOSIS — N3001 Acute cystitis with hematuria: Secondary | ICD-10-CM

## 2015-03-23 DIAGNOSIS — R109 Unspecified abdominal pain: Secondary | ICD-10-CM | POA: Diagnosis not present

## 2015-03-23 DIAGNOSIS — R319 Hematuria, unspecified: Secondary | ICD-10-CM

## 2015-03-23 LAB — POCT URINALYSIS DIPSTICK
Bilirubin, UA: NEGATIVE
GLUCOSE UA: NEGATIVE
Ketones, UA: NEGATIVE
Leukocytes, UA: NEGATIVE
Nitrite, UA: NEGATIVE
PROTEIN UA: NEGATIVE
SPEC GRAV UA: 1.01
UROBILINOGEN UA: 0.2
pH, UA: 6.5

## 2015-03-23 MED ORDER — CIPROFLOXACIN HCL 500 MG PO TABS: 500.0000 mg | ORAL_TABLET | Freq: Two times a day (BID) | ORAL | Status: AC

## 2015-03-23 NOTE — Progress Notes (Signed)
Chief Complaint: Possible UTI  History of Present Illness: Kathleen [name] is a 74 y.o. female who presents to [facility]  today with concerns for acute urinary tract infection  Onset: 1.5 weeks.  Location: Suprapubic, flank pain Quality: Burning/Urgency Exacerbating factors:   Frequency: yes  Hematuria: no  Vaginal bleeding: no  Fever/chills: no  Flank Pain: yes  No burning with urination.  Previous UTI: treated by Dr Madilyn Fireman 09/2014 - UCX negative, in 08/2014 UCX (+) EColi sensitive to Levo, Macrobid Recurrent UTI (3 times/more annually): NO Abx in past 3 months: none    Past medical, social and family history reviewed: Past Medical History  Diagnosis Date  . Herpes simplex     lt eye  . Kidney stones     history  . Cancer     Hx RT breast infiltrated ductal CA previously on tamoxifen and armidex   . MVP (mitral valve prolapse)     asymptomatic  . Hypertension    Past Surgical History  Procedure Laterality Date  . Cervical laminectomy  1986  . Abdominal hysterectomy      partial  . Breast lumpectomy      RT  . Throidectomy  1988    For cancer   . Shoulder surgery      rotator cuff repair   Social History  Substance Use Topics  . Smoking status: Never Smoker   . Smokeless tobacco: Not on file  . Alcohol Use: No   The patient has a family history of  Current Outpatient Prescriptions  Medication Sig Dispense Refill  . SYNTHROID 75 MCG tablet TAKE ONE (1) TABLET EACH DAY 30 tablet 6  . ValACYclovir HCl (VALTREX PO) Take by mouth.     No current facility-administered medications for this visit.   Allergies  Allergen Reactions  . Benicar [Olmesartan] Other (See Comments)    Diizzy  . Codeine   . Hctz [Hydrochlorothiazide]     Vertigo, dizziness.   . Lisinopril Other (See Comments)    lightheaded  . Penicillins   . Streptomycin Other (See Comments)    Passed out  . Sulfonamide Derivatives      Review of  Systems: CONSTITUTIONAL: Neg fever/chills, no unintentional weight changes CARDIAC: No chest pain/pressure/palpitations, no orthopnea RESPIRATORY: No cough/shortness of breath/wheeze GASTROINTESTINAL: No nausea/vomiting/abdominal pain/blood in stool/diarrhea/constipation MUSCULOSKELETAL: No myalgia/arthralgia/back pain GENITOURINARY: No incontinence, No abnormal genital bleeding/discharge. (+)Dysuria/UTI symptoms as per HPI  Exam:  There were no vitals filed for this visit. Constitutional: VSS, see above. General Appearance: alert, well-developed, well-nourished, NAD Respiratory: Normal respiratory effort. Breath sounds normal, no wheeze/rhonchi/rales Cardiovascular: S1/S2 normal, no murmur/rub/gallop auscultated.  Gastrointestinal: Nontender, no masses. No hepatomegaly, Bowel sounds normal, no splenomegaly. No hernia appreciated. Rectal exam deferred.  Musculoskeletal: Gait normal. No clubbing/cyanosis of digits. Lloyd sign negative bilaterally.    ASSESSMENT/PLAN: Acute cystitis with hematuria - chronic hematuria, may consider cause of her symptoms other than infection but will treat as such until can get culture results, pt to RTC if no improvement.  - Plan: ciprofloxacin (CIPRO) 500 MG tablet, Urinalysis, microscopic only  Flank pain - no concern for pyelonephritis - Plan: POCT Urinalysis Dipstick, Urine Culture  Blood in urine - chronic  Patient instructions  Printed: We will treat your symptoms as a UTI, will send urine culture to confirm. If you are not feeling any better within the next few days to a week, please schedule an appointment with Dr. Charise Carwin, sooner than you are currently  scheduled for an annual physical, in order to devote time to address this concern separately and thoroughly.

## 2015-03-23 NOTE — Patient Instructions (Signed)
We will treat your symptoms as a UTI, will send urine culture to confirm. If you are not feeling any better within the next few days to a week, please schedule an appointment with Dr. Charise Carwin, sooner than you are currently scheduled for an annual physical, in order to devote time to address this concern separately and thoroughly.

## 2015-03-24 LAB — URINALYSIS, MICROSCOPIC ONLY
BACTERIA UA: NONE SEEN [HPF]
Casts: NONE SEEN [LPF]
Crystals: NONE SEEN [HPF]
SQUAMOUS EPITHELIAL / LPF: NONE SEEN [HPF] (ref <=5)
WBC UA: NONE SEEN WBC/HPF (ref <=5)
Yeast: NONE SEEN [HPF]

## 2015-03-25 LAB — URINE CULTURE

## 2015-03-27 ENCOUNTER — Telehealth: Payer: Self-pay

## 2015-03-28 ENCOUNTER — Ambulatory Visit: Payer: 59

## 2015-04-03 ENCOUNTER — Other Ambulatory Visit: Payer: Self-pay | Admitting: Family Medicine

## 2015-04-13 ENCOUNTER — Ambulatory Visit (INDEPENDENT_AMBULATORY_CARE_PROVIDER_SITE_OTHER): Payer: 59 | Admitting: Family Medicine

## 2015-04-13 ENCOUNTER — Encounter: Payer: Self-pay | Admitting: Family Medicine

## 2015-04-13 VITALS — BP 173/73 | HR 73 | Temp 98.2°F | Ht 62.0 in | Wt 128.0 lb

## 2015-04-13 DIAGNOSIS — R319 Hematuria, unspecified: Secondary | ICD-10-CM | POA: Diagnosis not present

## 2015-04-13 DIAGNOSIS — M4120 Other idiopathic scoliosis, site unspecified: Secondary | ICD-10-CM | POA: Diagnosis not present

## 2015-04-13 DIAGNOSIS — R350 Frequency of micturition: Secondary | ICD-10-CM | POA: Diagnosis not present

## 2015-04-13 DIAGNOSIS — N029 Recurrent and persistent hematuria with unspecified morphologic changes: Secondary | ICD-10-CM | POA: Diagnosis not present

## 2015-04-13 DIAGNOSIS — M545 Low back pain, unspecified: Secondary | ICD-10-CM

## 2015-04-13 LAB — POCT URINALYSIS DIPSTICK
Bilirubin, UA: NEGATIVE
GLUCOSE UA: NEGATIVE
KETONES UA: NEGATIVE
LEUKOCYTES UA: NEGATIVE
Nitrite, UA: NEGATIVE
PROTEIN UA: NEGATIVE
Spec Grav, UA: 1.015
Urobilinogen, UA: 0.2
pH, UA: 6.5

## 2015-04-13 NOTE — Progress Notes (Signed)
   Subjective:    Patient ID: Kathleen [name], female    DOB: 07/25/1940, 74 y.o.   MRN: [id number]  HPI Just not feeling well and thinks she may have another UTI.  She didn't take the Cipro correctly.  No fevers, she reports that she has more frequency and feels "Blah". Has been really tired. No blood in the urine.  No fever, chillls, or sweats.   Feels like her scoliosis is getting worse.  She thinks he may have lost some stature.  She does experience some tightness in her muscles in her upper back and shoulders. And feels like her right shoulder is drooping a little bit more than it used to. She also feels like she started to tilt her head to the right. She has not tried any type of treatments or therapy such as massage or chiropractor.  Low back pain -  She also continues to have some low back pain that she's been also working some occasional 12 hour shifts at work andat this could be exacerbating her back as well. She has a marked posterior arthritis as well as degenerative disc disease in her spine. She is once to make sure it's not actually coming from a urinary tract infection.   Review of Systems     Objective:   Physical Exam  Constitutional: She is oriented to person, place, and time. She appears well-developed and well-nourished.  HENT:  Head: Normocephalic and atraumatic.  Cardiovascular: Normal rate, regular rhythm and normal heart sounds.   Pulmonary/Chest: Effort normal and breath sounds normal.  Musculoskeletal:  Shoulders with normal range of motion. She has a palpable knots in the upper trapezius muscles bilaterally. Her right shoulder does sit a little lower compared to her left. Lumbar spine with normal flexion extension and rotation.  Neurological: She is alert and oriented to person, place, and time.  Skin: Skin is warm and dry.  Psychiatric: She has a normal mood and affect. Her behavior is normal.          Assessment & Plan:   urinary frequency - unfortunately  her urine culture was contaminated showing multiple microbes. We re-collect the specimen with a clean catch today. We will call her with results once available. We will hold off on sending over another antibiotic today until we get the results. Though certainly if she gets worse she can call the office back. She does have a penicillin and sulfa allergies of these will need to be avoided.   scoliosis- we discussed that certainly working on posture is important as well as regular exercise. She does stay very active. I do think that massage therapy could be very helpful for her and working a lot of tension in her back. She had palpable knots in her trapezius muscles bilaterally. Next  Low back pain-normal range of motion of the lumbar spine. No radicular symptoms. Okay to use an anti-inflammatory and heat as needed. Again massage therapy could be helpful. Also still investigating UTI as a possible trigger.

## 2015-04-15 LAB — URINE CULTURE
Colony Count: NO GROWTH
Organism ID, Bacteria: NO GROWTH

## 2015-04-17 ENCOUNTER — Encounter: Payer: Self-pay | Admitting: Family Medicine

## 2015-04-17 ENCOUNTER — Ambulatory Visit (INDEPENDENT_AMBULATORY_CARE_PROVIDER_SITE_OTHER): Payer: 59 | Admitting: Family Medicine

## 2015-04-17 VITALS — BP 140/84 | HR 72 | Temp 97.8°F | Ht 62.25 in | Wt 128.0 lb

## 2015-04-17 DIAGNOSIS — E039 Hypothyroidism, unspecified: Secondary | ICD-10-CM | POA: Diagnosis not present

## 2015-04-17 DIAGNOSIS — Z0000 Encounter for general adult medical examination without abnormal findings: Secondary | ICD-10-CM | POA: Diagnosis not present

## 2015-04-17 DIAGNOSIS — M899 Disorder of bone, unspecified: Secondary | ICD-10-CM

## 2015-04-17 DIAGNOSIS — Z23 Encounter for immunization: Secondary | ICD-10-CM | POA: Diagnosis not present

## 2015-04-17 DIAGNOSIS — R7301 Impaired fasting glucose: Secondary | ICD-10-CM

## 2015-04-17 DIAGNOSIS — M949 Disorder of cartilage, unspecified: Secondary | ICD-10-CM

## 2015-04-17 LAB — COMPLETE METABOLIC PANEL WITH GFR
ALBUMIN: 4.1 g/dL (ref 3.6–5.1)
ALK PHOS: 64 U/L (ref 33–130)
ALT: 11 U/L (ref 6–29)
AST: 19 U/L (ref 10–35)
BUN: 22 mg/dL (ref 7–25)
CALCIUM: 9.5 mg/dL (ref 8.6–10.4)
CO2: 28 mmol/L (ref 20–31)
Chloride: 104 mmol/L (ref 98–110)
Creat: 0.69 mg/dL (ref 0.60–0.93)
GFR, EST NON AFRICAN AMERICAN: 85 mL/min (ref >=60)
Glucose, Bld: 91 mg/dL (ref 65–99)
Potassium: 3.9 mmol/L (ref 3.5–5.3)
Sodium: 140 mmol/L (ref 135–146)
Total Bilirubin: 0.8 mg/dL (ref 0.2–1.2)
Total Protein: 6.6 g/dL (ref 6.1–8.1)

## 2015-04-17 LAB — TSH: TSH: 0.299 u[IU]/mL — AB (ref 0.350–4.500)

## 2015-04-17 LAB — LIPID PANEL
Cholesterol: 200 mg/dL (ref 125–200)
HDL: 55 mg/dL (ref >=46)
LDL Cholesterol: 125 mg/dL (ref <130)
TRIGLYCERIDES: 99 mg/dL (ref <150)
Total CHOL/HDL Ratio: 3.6 Ratio (ref <=5.0)
VLDL: 20 mg/dL (ref <30)

## 2015-04-17 NOTE — Patient Instructions (Signed)
Keep up a regular exercise program and make sure you are eating a healthy diet Try to eat 4 servings of dairy a day, or if you are lactose intolerant take a calcium with vitamin D daily.  Your vaccines are up to date.   

## 2015-04-17 NOTE — Progress Notes (Signed)
Subjective:    Kathleen [name] is a 74 y.o. female who presents for Medicare Annual/Subsequent preventive examination.  Preventive Screening-Counseling & Management  Tobacco History  Smoking status  . Never Smoker   Smokeless tobacco  . Not on file     Problems Prior to Visit 1.   Current Problems (verified) Patient Active Problem List   Diagnosis Date Noted  . Abdominal pain, left lower quadrant 05/26/2013  . Concussion with no loss of consciousness 05/24/2013  . Plantar fasciitis, right 05/20/2013  . Right lumbar radiculitis 05/20/2013  . History of breast cancer 11/09/2012  . Piriformis syndrome of left side 08/15/2010  . Benign hematuria 07/05/2010  . POSTMENOPAUSAL STATUS 03/29/2010  . IMPAIRED FASTING GLUCOSE 12/11/2008  . LEG PAIN, BILATERAL 01/20/2008  . SHOULDER PAIN, LEFT 01/05/2008  . HIP PAIN, RIGHT 01/05/2008  . Disorder of bone and cartilage 02/26/2007  . Hypothyroidism 04/28/2006  . White coat hypertension 04/28/2006  . MITRAL VALVE DISORDER 04/28/2006    Medications Prior to Visit Current Outpatient Prescriptions on File Prior to Visit  Medication Sig Dispense Refill  . SYNTHROID 75 MCG tablet TAKE ONE (1) TABLET BY MOUTH EVERY DAY 30 tablet 0  . ValACYclovir HCl (VALTREX PO) Take by mouth.     No current facility-administered medications on file prior to visit.    Current Medications (verified) Current Outpatient Prescriptions  Medication Sig Dispense Refill  . SYNTHROID 75 MCG tablet TAKE ONE (1) TABLET BY MOUTH EVERY DAY 30 tablet 0  . ValACYclovir HCl (VALTREX PO) Take by mouth.     No current facility-administered medications for this visit.     Allergies (verified) Benicar; Codeine; Hctz; Lisinopril; Penicillins; Streptomycin; and Sulfonamide derivatives   PAST HISTORY  Family History Family History  Problem Relation Age of Onset  . Breast cancer Mother     Social History Social History  Substance Use Topics  . Smoking  status: Never Smoker   . Smokeless tobacco: Not on file  . Alcohol Use: No     Are there smokers in your home (other than you)? No  Risk Factors Current exercise habits: The patient has a physically strenuous job, but has no regular exercise apart from work.   Dietary issues discussed: None   Cardiac risk factors: advanced age (older than 5 for men, 47 for women).  Depression Screen (Note: if answer to either of the following is "Yes", a more complete depression screening is indicated)   Over the past two weeks, have you felt down, depressed or hopeless? No  Over the past two weeks, have you felt little interest or pleasure in doing things? No  Have you lost interest or pleasure in daily life? No  Do you often feel hopeless? No  Do you cry easily over simple problems? Yes  Activities of Daily Living In your present state of health, do you have any difficulty performing the following activities?:  Driving? No Managing money?  No Feeding yourself? No Getting from bed to chair? No   Climbing a flight of stairs? No Preparing food and eating?: No Bathing or showering? No Getting dressed: No Getting to the toilet? No Using the toilet:No Moving around from place to place: No In the past year have you fallen or had a near fall?:No   Are you sexually active?  No  Do you have more than one partner?  No  Hearing Difficulties: No Do you often ask people to speak up or repeat themselves? No Do you experience  ringing or noises in your ears? No Do you have difficulty understanding soft or whispered voices? No   Do you feel that you have a problem with memory? No  Do you often misplace items? No  Do you feel safe at home?  Yes  Cognitive Testing  Alert? Yes  Normal Appearance?Yes  Oriented to person? Yes  Place? Yes   Time? Yes  Recall of three objects?  Yes  Can perform simple calculations? Yes  Displays appropriate judgment?Yes  Can read the correct time from a watch  face?Yes   Advanced Directives have been discussed with the patient? Yes  List the Names of Other Physician/Practitioners you currently use: 1.    Indicate any recent Medical Services you may have received from other than [facility] providers in the past year (date may be approximate).  Immunization History  Administered Date(s) Administered  . Influenza Split 05/28/2012  . Influenza Whole 05/21/2005, 05/19/2007, 04/27/2008, 03/29/2010, 03/14/2011  . Influenza,inj,Quad PF,36+ Mos 04/15/2013, 04/25/2014, 04/17/2015  . Pneumococcal Conjugate-13 04/25/2014  . Pneumococcal Polysaccharide-23 06/12/2009  . Td 08/15/2010  . Zoster 03/29/2010    Screening Tests Health Maintenance  Topic Date Due  . MAMMOGRAM  07/07/2015  . INFLUENZA VACCINE  02/19/2016  . TETANUS/TDAP  08/15/2020  . COLONOSCOPY  03/03/2022  . DEXA SCAN  Completed  . ZOSTAVAX  Completed  . PNA vac Low Risk Adult  Completed    All answers were reviewed with the patient and necessary referrals were made:  METHENEY,CATHERINE, MD   04/17/2015   History reviewed: allergies, current medications, past family history, past medical history, past social history, past surgical history and problem list  Review of Systems A comprehensive review of systems was negative.    Objective:     Vision by Snellen chart:  Sees Dr. Bing Plume   Body mass index is 23.41 kg/(m^2). BP 140/84 mmHg  Pulse 72  Temp(Src) 97.8 F (36.6 C)  Wt 128 lb (58.06 kg)  BP 140/84 mmHg  Pulse 72  Temp(Src) 97.8 F (36.6 C)  Wt 128 lb (58.06 kg) General appearance: alert, cooperative and appears stated age Head: Normocephalic, without obvious abnormality, atraumatic Eyes: conj clear, EOMi, PEERLA Ears: normal TM's and external ear canals both ears Nose: Nares normal. Septum midline. Mucosa normal. No drainage or sinus tenderness. Throat: lips, mucosa, and tongue normal; teeth and gums normal Neck: no adenopathy, no carotid bruit, no JVD, supple,  symmetrical, trachea midline and thyroid not enlarged, symmetric, no tenderness/mass/nodules Back: symmetric, no curvature. ROM normal. No CVA tenderness. Lungs: clear to auscultation bilaterally Breasts: normal appearance, no masses or tenderness Heart: regular rate and rhythm, S1, S2 normal, no murmur, click, rub or gallop Abdomen: soft, non-tender; bowel sounds normal; no masses,  no organomegaly Extremities: extremities normal, atraumatic, no cyanosis or edema Pulses: 2+ and symmetric Skin: Skin color, texture, turgor normal. No rashes or lesions Lymph nodes: Cervical, supraclavicular, and axillary nodes normal. Neurologic: Alert and oriented X 3, normal strength and tone. Normal symmetric reflexes. Normal coordination and gait     Assessment:     Annual Medicare Wellness Exam       Plan:     During the course of the visit the patient was educated and counseled about appropriate screening and preventive services including:    Influenza vaccine  Diet review for nutrition referral? Yes ____  Not Indicated _X___   Patient Instructions (the written plan) was given to the patient.  Medicare Attestation I have personally reviewed: The patient's  medical and social history Their use of alcohol, tobacco or illicit drugs Their current medications and supplements The patient's functional ability including ADLs,fall risks, home safety risks, cognitive, and hearing and visual impairment Diet and physical activities Evidence for depression or mood disorders  The patient's weight, height, BMI, and visual acuity have been recorded in the chart.  I have made referrals, counseling, and provided education to the patient based on review of the above and I have provided the patient with a written personalized care plan for preventive services.     METHENEY,CATHERINE, MD   04/17/2015

## 2015-04-18 LAB — VITAMIN D 25 HYDROXY (VIT D DEFICIENCY, FRACTURES): VIT D 25 HYDROXY: 34 ng/mL (ref 30–100)

## 2015-04-20 DIAGNOSIS — L570 Actinic keratosis: Secondary | ICD-10-CM | POA: Diagnosis not present

## 2015-04-20 DIAGNOSIS — C44612 Basal cell carcinoma of skin of right upper limb, including shoulder: Secondary | ICD-10-CM | POA: Diagnosis not present

## 2015-05-01 ENCOUNTER — Other Ambulatory Visit: Payer: Self-pay | Admitting: Family Medicine

## 2015-05-10 NOTE — Telephone Encounter (Signed)
Opened in error

## 2015-05-18 DIAGNOSIS — L821 Other seborrheic keratosis: Secondary | ICD-10-CM | POA: Diagnosis not present

## 2015-05-18 DIAGNOSIS — D1801 Hemangioma of skin and subcutaneous tissue: Secondary | ICD-10-CM | POA: Diagnosis not present

## 2015-05-18 DIAGNOSIS — L812 Freckles: Secondary | ICD-10-CM | POA: Diagnosis not present

## 2015-05-18 DIAGNOSIS — L570 Actinic keratosis: Secondary | ICD-10-CM | POA: Diagnosis not present

## 2015-05-29 ENCOUNTER — Other Ambulatory Visit: Payer: Self-pay

## 2015-05-29 ENCOUNTER — Other Ambulatory Visit: Payer: Self-pay | Admitting: *Deleted

## 2015-05-29 DIAGNOSIS — Z1231 Encounter for screening mammogram for malignant neoplasm of breast: Secondary | ICD-10-CM

## 2015-05-29 DIAGNOSIS — M8580 Other specified disorders of bone density and structure, unspecified site: Secondary | ICD-10-CM

## 2015-06-19 DIAGNOSIS — H524 Presbyopia: Secondary | ICD-10-CM | POA: Diagnosis not present

## 2015-06-19 DIAGNOSIS — H25811 Combined forms of age-related cataract, right eye: Secondary | ICD-10-CM | POA: Diagnosis not present

## 2015-06-19 DIAGNOSIS — H26492 Other secondary cataract, left eye: Secondary | ICD-10-CM | POA: Diagnosis not present

## 2015-06-25 ENCOUNTER — Encounter: Payer: Self-pay | Admitting: Family Medicine

## 2015-06-25 ENCOUNTER — Ambulatory Visit (INDEPENDENT_AMBULATORY_CARE_PROVIDER_SITE_OTHER): Payer: 59 | Admitting: Family Medicine

## 2015-06-25 VITALS — BP 165/75 | HR 77 | Temp 98.1°F | Resp 18 | Wt 129.1 lb

## 2015-06-25 DIAGNOSIS — J069 Acute upper respiratory infection, unspecified: Secondary | ICD-10-CM | POA: Diagnosis not present

## 2015-06-25 DIAGNOSIS — IMO0001 Reserved for inherently not codable concepts without codable children: Secondary | ICD-10-CM

## 2015-06-25 DIAGNOSIS — G5702 Lesion of sciatic nerve, left lower limb: Secondary | ICD-10-CM | POA: Diagnosis not present

## 2015-06-25 DIAGNOSIS — E039 Hypothyroidism, unspecified: Secondary | ICD-10-CM | POA: Diagnosis not present

## 2015-06-25 DIAGNOSIS — R030 Elevated blood-pressure reading, without diagnosis of hypertension: Secondary | ICD-10-CM

## 2015-06-25 MED ORDER — IBUPROFEN 800 MG PO TABS: 800.0000 mg | ORAL_TABLET | Freq: Three times a day (TID) | ORAL | Status: DC | PRN

## 2015-06-25 NOTE — Progress Notes (Signed)
   Subjective:    Patient ID: Kathleen [name], female    DOB: 1940/08/26, 74 y.o.   MRN: [id number]  HPI Started feeling bad on Sat, 2 days ago. Says has a very ST. Using Tylenol and vitamin C.  Has a slight cough bc says her throat tickles.  No nasal congestion.  No fever, chills or sweats.  No GI sxs.  Feels really tired. Eyes fee like they are burning.   Hypothyroidism-her TSH was a little too suppressed so we adjusted her regimen. It has been 8 weeks. Due to recheck thyroid level again.   Review of Systems     Objective:   Physical Exam  Constitutional: She is oriented to person, place, and time. She appears well-developed and well-nourished.  HENT:  Head: Normocephalic and atraumatic.  Right Ear: External ear normal.  Left Ear: External ear normal.  Nose: Nose normal.  Mouth/Throat: Oropharynx is clear and moist.  TMs and canals are clear.   Eyes: Conjunctivae and EOM are normal. Pupils are equal, round, and reactive to light.  Neck: Neck supple. No thyromegaly present.  Cardiovascular: Normal rate and regular rhythm.   Split S2.   Pulmonary/Chest: Effort normal and breath sounds normal. She has no wheezes.  Lymphadenopathy:    She has no cervical adenopathy.  Neurological: She is alert and oriented to person, place, and time.  Skin: Skin is warm and dry.  Psychiatric: She has a normal mood and affect.          Assessment & Plan:  Pharyngitis-viral versus bacterial. Strep rapid test performed today. Recommend symptomatic care. clal if not better in one week. Neg strep test.   White coat hypertension - repeat BP stilll eelvated. She will check later today at home.   She did request a refill on her ibuprofen 800 mg. She uses sit sparingly. In fact she says the bottle that she has about 75 years old. It was originally prescribed for piriformis syndrome and when she has a lot of aching or pain she will use it.  Hypothyroidism-we adjusted her regimen about 8 weeks ago.  She's due to recheck a TSH.

## 2015-07-10 ENCOUNTER — Ambulatory Visit
Admission: RE | Admit: 2015-07-10 | Discharge: 2015-07-10 | Disposition: A | Discharge status: Home / Self Care | Payer: 59 | Source: Ambulatory Visit | Attending: Family Medicine | Admitting: Family Medicine

## 2015-07-10 ENCOUNTER — Ambulatory Visit
Admission: RE | Admit: 2015-07-10 | Discharge: 2015-07-10 | Disposition: A | Discharge status: Home / Self Care | Payer: 59 | Source: Ambulatory Visit

## 2015-07-10 DIAGNOSIS — M8580 Other specified disorders of bone density and structure, unspecified site: Secondary | ICD-10-CM

## 2015-07-10 DIAGNOSIS — Z1231 Encounter for screening mammogram for malignant neoplasm of breast: Secondary | ICD-10-CM

## 2015-07-17 ENCOUNTER — Telehealth: Payer: Self-pay

## 2015-07-17 NOTE — Telephone Encounter (Signed)
-----   Message from Hali Marry, MD sent at 07/16/2015  7:25 PM EST ----- Please call patient. Normal mammogram.  Repeat in 1 year.

## 2015-07-17 NOTE — Telephone Encounter (Signed)
Patient aware of mammogram results and recommendations. 

## 2015-07-30 DIAGNOSIS — H25811 Combined forms of age-related cataract, right eye: Secondary | ICD-10-CM | POA: Diagnosis not present

## 2015-07-30 DIAGNOSIS — H2511 Age-related nuclear cataract, right eye: Secondary | ICD-10-CM | POA: Diagnosis not present

## 2015-08-08 ENCOUNTER — Other Ambulatory Visit: Payer: Self-pay | Admitting: Family Medicine

## 2015-08-15 ENCOUNTER — Encounter: Payer: Self-pay | Admitting: Family Medicine

## 2015-08-15 ENCOUNTER — Ambulatory Visit (INDEPENDENT_AMBULATORY_CARE_PROVIDER_SITE_OTHER): Payer: 59 | Admitting: Family Medicine

## 2015-08-15 VITALS — BP 148/56 | HR 82 | Temp 98.1°F | Wt 127.0 lb

## 2015-08-15 DIAGNOSIS — N3000 Acute cystitis without hematuria: Secondary | ICD-10-CM

## 2015-08-15 DIAGNOSIS — E039 Hypothyroidism, unspecified: Secondary | ICD-10-CM | POA: Diagnosis not present

## 2015-08-15 DIAGNOSIS — R300 Dysuria: Secondary | ICD-10-CM

## 2015-08-15 LAB — POCT URINALYSIS DIPSTICK
BILIRUBIN UA: NEGATIVE
GLUCOSE UA: NEGATIVE
KETONES UA: NEGATIVE
Nitrite, UA: POSITIVE
PROTEIN UA: NEGATIVE
SPEC GRAV UA: 1.02
Urobilinogen, UA: 0.2
pH, UA: 6

## 2015-08-15 MED ORDER — CIPROFLOXACIN HCL 500 MG PO TABS: 500.0000 mg | ORAL_TABLET | Freq: Two times a day (BID) | ORAL | Status: AC

## 2015-08-15 NOTE — Progress Notes (Signed)
   Subjective:    Patient ID: Kathleen [name], female    DOB: [birth year], 75 y.o.   MRN: [id number]  HPI C/O dysuria and freq x 3 days. No fever, chills or sweats.  No back pain.    Hypothyroid. - her TSH was actually too suppressed back in September and we reduce her dose. She is due to repeat her thyroid hormone level.  Review of Systems     Objective:   Physical Exam  Constitutional: She is oriented to person, place, and time. She appears well-developed and well-nourished.  HENT:  Head: Normocephalic and atraumatic.  Eyes: Conjunctivae and EOM are normal.  Cardiovascular: Normal rate.   Pulmonary/Chest: Effort normal.  Neurological: She is alert and oriented to person, place, and time.  Skin: Skin is dry. No pallor.  Psychiatric: She has a normal mood and affect. Her behavior is normal.  Vitals reviewed.         Assessment & Plan:  UTI - will tx with cipro. Call if not better in one week.    Hypothyroidism-due to recheck TSH.

## 2015-08-16 LAB — TSH: TSH: 0.484 u[IU]/mL (ref 0.350–4.500)

## 2015-09-12 ENCOUNTER — Other Ambulatory Visit: Payer: Self-pay | Admitting: Family Medicine

## 2015-11-15 DIAGNOSIS — L570 Actinic keratosis: Secondary | ICD-10-CM | POA: Diagnosis not present

## 2015-11-15 DIAGNOSIS — L812 Freckles: Secondary | ICD-10-CM | POA: Diagnosis not present

## 2015-11-15 DIAGNOSIS — Z85828 Personal history of other malignant neoplasm of skin: Secondary | ICD-10-CM | POA: Diagnosis not present

## 2015-11-15 DIAGNOSIS — L821 Other seborrheic keratosis: Secondary | ICD-10-CM | POA: Diagnosis not present

## 2015-11-15 DIAGNOSIS — D485 Neoplasm of uncertain behavior of skin: Secondary | ICD-10-CM | POA: Diagnosis not present

## 2015-11-15 DIAGNOSIS — D1801 Hemangioma of skin and subcutaneous tissue: Secondary | ICD-10-CM | POA: Diagnosis not present

## 2015-11-16 ENCOUNTER — Ambulatory Visit (INDEPENDENT_AMBULATORY_CARE_PROVIDER_SITE_OTHER): Payer: Medicare Other | Admitting: Family Medicine

## 2015-11-16 ENCOUNTER — Encounter: Payer: Self-pay | Admitting: Family Medicine

## 2015-11-16 VITALS — BP 151/76 | HR 73 | Wt 125.8 lb

## 2015-11-16 DIAGNOSIS — N029 Recurrent and persistent hematuria with unspecified morphologic changes: Secondary | ICD-10-CM | POA: Diagnosis not present

## 2015-11-16 DIAGNOSIS — R1030 Lower abdominal pain, unspecified: Secondary | ICD-10-CM | POA: Diagnosis not present

## 2015-11-16 DIAGNOSIS — E039 Hypothyroidism, unspecified: Secondary | ICD-10-CM | POA: Diagnosis not present

## 2015-11-16 DIAGNOSIS — H6123 Impacted cerumen, bilateral: Secondary | ICD-10-CM

## 2015-11-16 LAB — POCT URINALYSIS DIPSTICK
Bilirubin, UA: NEGATIVE
Glucose, UA: NEGATIVE
Ketones, UA: NEGATIVE
Leukocytes, UA: NEGATIVE
NITRITE UA: NEGATIVE
PH UA: 5.5
Protein, UA: NEGATIVE
Spec Grav, UA: 1.015
UROBILINOGEN UA: 0.2

## 2015-11-16 NOTE — Progress Notes (Signed)
   Subjective:    Patient ID: Kathleen [name], female    DOB: January 23, 1941, 75 y.o.   MRN: [id number]  HPI She comes in today complaining of some urinary symptoms over the last 2 weeks. She's had a lot of suprapubic pressure and low back pain. She said she may have just overdone at work but sometimes assists what she gets when she does experience a UTI. She's not noticed any blood in the urine. No fevers or chills. No dysuria.  She also wants me if he could her ears again today. When she was here last time she had a bilateral cerumen impaction. I recommended a trial of over-the-counter D proximal to help soften the wax. But she says she actually felt like it made it worse like it was clogging her ears so she just stopped it.   Review of Systems     Objective:   Physical Exam  Constitutional: She is oriented to person, place, and time. She appears well-developed and well-nourished.  HENT:  Head: Normocephalic and atraumatic.  Tympanic membranes blocked bilaterally by cerumen.  Cardiovascular: Normal rate, regular rhythm and normal heart sounds.   Pulmonary/Chest: Effort normal and breath sounds normal.  Abdominal: Soft. Bowel sounds are normal. She exhibits no distension and no mass. There is tenderness. There is no rebound and no guarding.  Mild suprapubic tenderness.  Neurological: She is alert and oriented to person, place, and time.  Skin: Skin is warm and dry.  Psychiatric: She has a normal mood and affect. Her behavior is normal.    No CVA tenderness.     Assessment & Plan:  Suprapubic pressure/pain-urinalysis showed moderate blood today. We'll send for culture for further evaluation. Okay to take anti-inflammatory as needed for low back and pelvic pressure and pain. Will call for results once available.  Bilateral cerumen impaction-recommend irrigation today.She tolerated well. We were able to completely remove the impaction and she noticed significant improvement in her hearing  immediately.  Indication: Cerumen impaction of the ear(s)  Medical necessity statement: On physical examination, cerumen impairs clinically significant portions of the external auditory canal, and tympanic membrane. Noted obstructive, copious cerumen that cannot be removed without magnification and instrumentations requiring physician skills Consent: Discussed benefits and risks of procedure and verbal consent obtained Procedure: Patient was prepped for the procedure. Utilized an otoscope to assess and take note of the ear canal, the tympanic membrane, and the presence, amount, and placement of the cerumen. Gentle water irrigation and soft plastic curette was utilized to remove cerumen.  Post procedure examination: shows cerumen was completely removed. Patient tolerated procedure well. The patient is made aware that they may experience temporary vertigo, temporary hearing loss, and temporary discomfort. If these symptom last for more than 24 hours to call the clinic or proceed to the ED.

## 2015-11-17 LAB — TSH: TSH: 1.55 m[IU]/L

## 2015-11-18 LAB — URINE CULTURE
Colony Count: NO GROWTH
Organism ID, Bacteria: NO GROWTH

## 2015-12-06 DIAGNOSIS — C44529 Squamous cell carcinoma of skin of other part of trunk: Secondary | ICD-10-CM | POA: Diagnosis not present

## 2015-12-27 ENCOUNTER — Telehealth: Payer: Self-pay

## 2015-12-27 DIAGNOSIS — M419 Scoliosis, unspecified: Secondary | ICD-10-CM

## 2015-12-27 NOTE — Telephone Encounter (Signed)
Hanh wants a referral to chiropractic for scoliosis. Sent referral.

## 2016-02-01 ENCOUNTER — Other Ambulatory Visit: Payer: Self-pay | Admitting: Family Medicine

## 2016-02-26 DIAGNOSIS — H04123 Dry eye syndrome of bilateral lacrimal glands: Secondary | ICD-10-CM | POA: Diagnosis not present

## 2016-04-16 ENCOUNTER — Encounter: Payer: Self-pay | Admitting: Family Medicine

## 2016-04-16 ENCOUNTER — Ambulatory Visit (INDEPENDENT_AMBULATORY_CARE_PROVIDER_SITE_OTHER): Payer: 59 | Admitting: Family Medicine

## 2016-04-16 VITALS — BP 137/76 | HR 72 | Ht 62.25 in | Wt 125.0 lb

## 2016-04-16 DIAGNOSIS — E039 Hypothyroidism, unspecified: Secondary | ICD-10-CM | POA: Diagnosis not present

## 2016-04-16 DIAGNOSIS — H6122 Impacted cerumen, left ear: Secondary | ICD-10-CM

## 2016-04-16 DIAGNOSIS — Z23 Encounter for immunization: Secondary | ICD-10-CM

## 2016-04-16 DIAGNOSIS — M899 Disorder of bone, unspecified: Secondary | ICD-10-CM | POA: Diagnosis not present

## 2016-04-16 DIAGNOSIS — Z13220 Encounter for screening for lipoid disorders: Secondary | ICD-10-CM

## 2016-04-16 DIAGNOSIS — N029 Recurrent and persistent hematuria with unspecified morphologic changes: Secondary | ICD-10-CM | POA: Diagnosis not present

## 2016-04-16 DIAGNOSIS — M949 Disorder of cartilage, unspecified: Secondary | ICD-10-CM

## 2016-04-16 DIAGNOSIS — R7301 Impaired fasting glucose: Secondary | ICD-10-CM

## 2016-04-16 DIAGNOSIS — Z0000 Encounter for general adult medical examination without abnormal findings: Secondary | ICD-10-CM | POA: Diagnosis not present

## 2016-04-16 DIAGNOSIS — R8290 Unspecified abnormal findings in urine: Secondary | ICD-10-CM

## 2016-04-16 LAB — COMPLETE METABOLIC PANEL WITH GFR
ALBUMIN: 4 g/dL (ref 3.6–5.1)
ALK PHOS: 59 U/L (ref 33–130)
ALT: 9 U/L (ref 6–29)
AST: 17 U/L (ref 10–35)
BUN: 17 mg/dL (ref 7–25)
CALCIUM: 9.2 mg/dL (ref 8.6–10.4)
CO2: 25 mmol/L (ref 20–31)
CREATININE: 0.75 mg/dL (ref 0.60–0.93)
Chloride: 105 mmol/L (ref 98–110)
GFR, Est African American: >89 mL/min (ref >=60)
GFR, Est Non African American: 78 mL/min (ref >=60)
Glucose, Bld: 93 mg/dL (ref 65–99)
Potassium: 4.1 mmol/L (ref 3.5–5.3)
Sodium: 142 mmol/L (ref 135–146)
Total Bilirubin: 0.8 mg/dL (ref 0.2–1.2)
Total Protein: 6.2 g/dL (ref 6.1–8.1)

## 2016-04-16 LAB — LIPID PANEL
CHOLESTEROL: 205 mg/dL — AB (ref 125–200)
HDL: 58 mg/dL (ref >=46)
LDL Cholesterol: 134 mg/dL — ABNORMAL HIGH (ref <130)
Total CHOL/HDL Ratio: 3.5 Ratio (ref <=5.0)
Triglycerides: 63 mg/dL (ref <150)
VLDL: 13 mg/dL (ref <30)

## 2016-04-16 LAB — POCT URINALYSIS DIPSTICK
Bilirubin, UA: NEGATIVE
GLUCOSE UA: NEGATIVE
Ketones, UA: NEGATIVE
Leukocytes, UA: NEGATIVE
NITRITE UA: NEGATIVE
Protein, UA: NEGATIVE
Spec Grav, UA: >=1.03
UROBILINOGEN UA: 0.2
pH, UA: 6

## 2016-04-16 LAB — POCT GLYCOSYLATED HEMOGLOBIN (HGB A1C): HEMOGLOBIN A1C: 5.8

## 2016-04-16 LAB — TSH: TSH: 0.97 m[IU]/L

## 2016-04-16 NOTE — Progress Notes (Signed)
Subjective:   Kathleen [name] is a 75 y.o. female who presents for Medicare Annual (Subsequent) preventive examination.  Patient would like [place] to check her ears today. She feels like they're blocked with wax again. She has noticed some decrease in hearing.  Review of Systems:  Comprehensive ROS is neg.       Objective:     Vitals: BP 137/76   Pulse 72   Ht 5' 2.25" (1.581 m)   Wt 125 lb (56.7 kg)   SpO2 100%   BMI 22.68 kg/m   Body mass index is 22.68 kg/m.   Physical Exam  Constitutional: She is oriented to person, place, and time. She appears well-developed and well-nourished.  HENT:  Head: Normocephalic and atraumatic.  Right Ear: External ear normal.  Left Ear: External ear normal.  Nose: Nose normal.  Mouth/Throat: Oropharynx is clear and moist.  Left canal blocked by cerumen. Right canal with just a small amount of cerumen able to visualize the tympanic membrane.  Eyes: Conjunctivae and EOM are normal. Pupils are equal, round, and reactive to light.  Neck: Neck supple. No thyromegaly present.  Cardiovascular: Normal rate, regular rhythm and normal heart sounds.   Pulmonary/Chest: Effort normal and breath sounds normal. She has no wheezes.  Abdominal: Soft. Bowel sounds are normal. She exhibits no distension and no mass. There is no tenderness. There is no rebound and no guarding. No hernia.  Lymphadenopathy:    She has no cervical adenopathy.  Neurological: She is alert and oriented to person, place, and time.  Skin: Skin is warm and dry.  Psychiatric: She has a normal mood and affect.    Tobacco History  Smoking Status  . Never Smoker  Smokeless Tobacco  . Not on file     Counseling given: Not Answered   Past Medical History:  Diagnosis Date  . Cancer ([place])    Hx RT breast infiltrated ductal CA previously on tamoxifen and armidex   . Herpes simplex    lt eye  . Hypertension   . Kidney stones    history  . MVP (mitral valve prolapse)    asymptomatic   Past Surgical History:  Procedure Laterality Date  . ABDOMINAL HYSTERECTOMY     partial  . BREAST LUMPECTOMY     RT  . CERVICAL LAMINECTOMY  1986  . SHOULDER SURGERY     rotator cuff repair  . throidectomy  1988   For cancer    Family History  Problem Relation Age of Onset  . Breast cancer Mother    History  Sexual Activity  . 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    Outpatient Encounter Prescriptions as of 04/16/2016  Medication Sig  . ibuprofen (ADVIL,MOTRIN) 800 MG tablet Take 1 tablet (800 mg total) by mouth every 8 (eight) hours as needed.  Marland Kitchen SYNTHROID 75 MCG tablet TAKE ONE (1) TABLET EACH DAY BEFORE BREAKFAST  . ValACYclovir HCl (VALTREX PO) Take by mouth.   No facility-administered encounter medications on file as of 04/16/2016.     Activities of Daily Living In your present state of health, do you have any difficulty performing the following activities: 04/16/2016  Hearing? N  Vision? N  Difficulty concentrating or making decisions? N  Walking or climbing stairs? N  Dressing or bathing? N  Doing errands, shopping? N  Some recent  data might be hidden    Patient Care Team: Hali Marry, MD as PCP - General    Assessment:    Medicare Wellness Exam  Exercise Activities and Dietary recommendations Current Exercise Habits: The patient has a physically strenous job, but has no regular exercise apart from work.  Goals    None     Fall Risk Fall Risk  04/16/2016 04/25/2014 04/15/2013  Falls in the past year? No No No  Risk for fall due to : - (No Data) -  Risk for fall due to (comments): - not a fall risk -   Depression Screen PHQ 2/9 Scores 04/16/2016 04/25/2014 04/15/2013  PHQ - 2 Score 0 0 0     Cognitive Testing No flowsheet data found.  Immunization History  Administered Date(s) Administered  . Influenza  Split 05/28/2012  . Influenza Whole 05/21/2005, 05/19/2007, 04/27/2008, 03/29/2010, 03/14/2011  . Influenza,inj,Quad PF,36+ Mos 04/15/2013, 04/25/2014, 04/17/2015  . Pneumococcal Conjugate-13 04/25/2014  . Pneumococcal Polysaccharide-23 06/12/2009  . Td 08/15/2010  . Zoster 03/29/2010   Screening Tests Health Maintenance  Topic Date Due  . INFLUENZA VACCINE  02/19/2016  . MAMMOGRAM  07/09/2016  . TETANUS/TDAP  08/15/2020  . DEXA SCAN  Completed  . ZOSTAVAX  Completed  . PNA vac Low Risk Adult  Completed      Plan:    During the course of the visit the patient was educated and counseled about the following appropriate screening and preventive services:   Vaccines to include Pneumoccal, Influenza, Hepatitis B, Td, Zostavax, HCV  Cardiovascular Disease  Colorectal cancer screening  Bone density screening  Diabetes screening  Glaucoma screening  Mammography/PAP  Nutrition counseling   Cerumen impaction-left ear. Irrigation performed. Patient tolerated well.  Indication: Cerumen impaction of the ear(s) Medical necessity statement: On physical examination, cerumen impairs clinically significant portions of the external auditory canal, and tympanic membrane. Noted obstructive, copious cerumen that cannot be removed without magnification and instrumentations requiring physician skills Consent: Discussed benefits and risks of procedure and verbal consent obtained Procedure: Patient was prepped for the procedure. Utilized an otoscope to assess and take note of the ear canal, the tympanic membrane, and the presence, amount, and placement of the cerumen. Gentle water irrigation and soft plastic curette was utilized to remove cerumen.  Post procedure examination: shows cerumen was completely removed. Patient tolerated procedure well. The patient is made aware that they may experience temporary vertigo, temporary hearing loss, and temporary discomfort. If these symptom last for more  than 24 hours to call the clinic or proceed to the ED.    Patient Instructions (the written plan) was given to the patient.   Traci Gafford, MD  04/16/2016

## 2016-04-17 LAB — VITAMIN D 25 HYDROXY (VIT D DEFICIENCY, FRACTURES): Vit D, 25-Hydroxy: 32 ng/mL (ref 30–100)

## 2016-04-17 NOTE — Addendum Note (Signed)
Addended by: Teddy Spike on: 04/17/2016 12:58 PM   Modules accepted: Orders

## 2016-04-19 LAB — URINE CULTURE: Organism ID, Bacteria: NO GROWTH

## 2016-05-02 ENCOUNTER — Other Ambulatory Visit: Payer: Self-pay | Admitting: Family Medicine

## 2016-05-02 DIAGNOSIS — Z1231 Encounter for screening mammogram for malignant neoplasm of breast: Secondary | ICD-10-CM

## 2016-05-07 DIAGNOSIS — L723 Sebaceous cyst: Secondary | ICD-10-CM | POA: Diagnosis not present

## 2016-05-07 DIAGNOSIS — L570 Actinic keratosis: Secondary | ICD-10-CM | POA: Diagnosis not present

## 2016-05-07 DIAGNOSIS — Z85828 Personal history of other malignant neoplasm of skin: Secondary | ICD-10-CM | POA: Diagnosis not present

## 2016-05-07 DIAGNOSIS — L821 Other seborrheic keratosis: Secondary | ICD-10-CM | POA: Diagnosis not present

## 2016-05-07 DIAGNOSIS — D1801 Hemangioma of skin and subcutaneous tissue: Secondary | ICD-10-CM | POA: Diagnosis not present

## 2016-05-07 DIAGNOSIS — L814 Other melanin hyperpigmentation: Secondary | ICD-10-CM | POA: Diagnosis not present

## 2016-05-07 DIAGNOSIS — D485 Neoplasm of uncertain behavior of skin: Secondary | ICD-10-CM | POA: Diagnosis not present

## 2016-05-14 DIAGNOSIS — M1811 Unilateral primary osteoarthritis of first carpometacarpal joint, right hand: Secondary | ICD-10-CM | POA: Diagnosis not present

## 2016-05-14 DIAGNOSIS — M19042 Primary osteoarthritis, left hand: Secondary | ICD-10-CM | POA: Diagnosis not present

## 2016-05-14 DIAGNOSIS — M19041 Primary osteoarthritis, right hand: Secondary | ICD-10-CM | POA: Diagnosis not present

## 2016-05-14 DIAGNOSIS — R52 Pain, unspecified: Secondary | ICD-10-CM | POA: Diagnosis not present

## 2016-05-27 DIAGNOSIS — C44619 Basal cell carcinoma of skin of left upper limb, including shoulder: Secondary | ICD-10-CM | POA: Diagnosis not present

## 2016-06-24 ENCOUNTER — Other Ambulatory Visit: Payer: Self-pay | Admitting: Family Medicine

## 2016-07-10 ENCOUNTER — Ambulatory Visit
Admission: RE | Admit: 2016-07-10 | Discharge: 2016-07-10 | Disposition: A | Discharge status: Home / Self Care | Payer: 59 | Source: Ambulatory Visit | Attending: Family Medicine | Admitting: Family Medicine

## 2016-07-10 DIAGNOSIS — Z1231 Encounter for screening mammogram for malignant neoplasm of breast: Secondary | ICD-10-CM

## 2016-07-30 ENCOUNTER — Telehealth: Payer: Self-pay | Admitting: *Deleted

## 2016-07-30 ENCOUNTER — Encounter: Payer: Self-pay | Admitting: *Deleted

## 2016-07-30 ENCOUNTER — Emergency Department (INDEPENDENT_AMBULATORY_CARE_PROVIDER_SITE_OTHER)
Admission: EM | Admit: 2016-07-30 | Discharge: 2016-07-30 | Disposition: A | Discharge status: Home / Self Care | Payer: 59 | Source: Home / Self Care | Attending: Family Medicine | Admitting: Family Medicine

## 2016-07-30 DIAGNOSIS — N3000 Acute cystitis without hematuria: Secondary | ICD-10-CM

## 2016-07-30 DIAGNOSIS — R300 Dysuria: Secondary | ICD-10-CM | POA: Diagnosis not present

## 2016-07-30 LAB — POCT URINALYSIS DIP (MANUAL ENTRY)
GLUCOSE UA: NEGATIVE
Nitrite, UA: POSITIVE — AB
Protein Ur, POC: NEGATIVE
SPEC GRAV UA: 1.015 (ref 1.005–1.03)
Urobilinogen, UA: 1 (ref 0–1)
pH, UA: 6.5 (ref 5–8)

## 2016-07-30 MED ORDER — CIPROFLOXACIN HCL 250 MG PO TABS: 250.0000 mg | ORAL_TABLET | Freq: Two times a day (BID) | ORAL | 0 refills | Status: DC

## 2016-07-30 NOTE — ED Triage Notes (Signed)
Pt c/o urinary frequency, bladder pressure and LBP x 3 wks.

## 2016-07-30 NOTE — ED Provider Notes (Signed)
[name]    CSN: [id number] Arrival date & time: 07/30/16  1215     History   Chief Complaint Chief Complaint  Patient presents with  . Urinary Frequency    HPI Kathleen [name] is a 76 y.o. female.   Patient complains of three week history of frequency, urgency, and nocturia.  She has a history of microscopic hematuria.     The history is provided by the patient.  Urinary Frequency  This is a recurrent problem. The current episode started more than 1 week ago. The problem occurs constantly. The problem has not changed since onset.Associated symptoms include headaches. Pertinent negatives include no abdominal pain. Nothing aggravates the symptoms. Nothing relieves the symptoms. She has tried nothing for the symptoms.    Past Medical History:  Diagnosis Date  . Cancer ([place])    Hx RT breast infiltrated ductal CA previously on tamoxifen and armidex   . Herpes simplex    lt eye  . Hypertension   . Kidney stones    history  . MVP (mitral valve prolapse)    asymptomatic    Patient Active Problem List   Diagnosis Date Noted  . History of breast cancer 11/09/2012  . Benign hematuria 07/05/2010  . POSTMENOPAUSAL STATUS 03/29/2010  . IMPAIRED FASTING GLUCOSE 12/11/2008  . LEG PAIN, BILATERAL 01/20/2008  . HIP PAIN, RIGHT 01/05/2008  . Disorder of bone and cartilage 02/26/2007  . Hypothyroidism 04/28/2006  . White coat hypertension 04/28/2006  . MITRAL VALVE DISORDER 04/28/2006    Past Surgical History:  Procedure Laterality Date  . ABDOMINAL HYSTERECTOMY     partial  . BREAST LUMPECTOMY     RT  . CERVICAL LAMINECTOMY  1986  . SHOULDER SURGERY     rotator cuff repair  . throidectomy  1988   For cancer     OB History    No data available       Home Medications    Prior to Admission medications   Medication Sig Start Date End Date Taking? Authorizing Provider  SYNTHROID 75 MCG tablet TAKE ONE (1) TABLET BY MOUTH EVERY DAY BEFORE BREAKFAST  06/24/16  Yes Hali Marry, MD  ValACYclovir HCl (VALTREX PO) Take by mouth.   Yes Historical Provider, MD  ciprofloxacin (CIPRO) 250 MG tablet Take 1 tablet (250 mg total) by mouth 2 (two) times daily. 07/30/16   Kandra Nicolas, MD  ibuprofen (ADVIL,MOTRIN) 800 MG tablet Take 1 tablet (800 mg total) by mouth every 8 (eight) hours as needed. 06/25/15   Hali Marry, MD    Family History Family History  Problem Relation Age of Onset  . Breast cancer Mother     Social History Social History  Substance Use Topics  . Smoking status: Never Smoker  . Smokeless tobacco: Never Used  . Alcohol use No     Allergies   Benicar [olmesartan]; Codeine; Hctz [hydrochlorothiazide]; Lisinopril; Penicillins; Streptomycin; and Sulfonamide derivatives   Review of Systems Review of Systems  Gastrointestinal: Negative for abdominal pain.  Genitourinary: Positive for dysuria, frequency and urgency. Negative for flank pain, genital sores, hematuria and pelvic pain.  Musculoskeletal: Negative.   Skin: Negative.   Neurological: Positive for headaches.  All other systems reviewed and are negative.    Physical Exam Triage Vital Signs ED Triage Vitals  Enc Vitals Group     BP 07/30/16 1235 154/77     Pulse Rate 07/30/16 1235 73     Resp 07/30/16 1235 16  Temp 07/30/16 1235 98.1 F (36.7 C)     Temp Source 07/30/16 1235 Oral     SpO2 07/30/16 1235 99 %     Weight 07/30/16 1235 120 lb (54.4 kg)     Height 07/30/16 1235 5\' 2"  (1.575 m)     Head Circumference --      Peak Flow --      Pain Score 07/30/16 1236 0     Pain Loc --      Pain Edu? --      Excl. in [place]? --    No data found.   Updated Vital Signs BP 154/77 (BP Location: Left Arm)   Pulse 73   Temp 98.1 F (36.7 C) (Oral)   Resp 16   Ht 5\' 2"  (1.575 m)   Wt 120 lb (54.4 kg)   SpO2 99%   BMI 21.95 kg/m   Visual Acuity Right Eye Distance:   Left Eye Distance:   Bilateral Distance:    Right Eye Near:     Left Eye Near:    Bilateral Near:     Physical Exam Nursing notes and Vital Signs reviewed. Appearance:  Patient appears stated age, and in no acute distress.    Eyes:  Pupils are equal, round, and reactive to light and accomodation.  Extraocular movement is intact.  Conjunctivae are not inflamed   Pharynx:  Normal; moist mucous membranes  Neck:  Supple.  No adenopathy Lungs:  Clear to auscultation.  Breath sounds are equal.  Moving air well. Heart:  Regular rate and rhythm without murmurs, rubs, or gallops.  Abdomen:  Nontender without masses or hepatosplenomegaly.  Bowel sounds are present.  No CVA or flank tenderness.  Extremities:  No edema.  Skin:  No rash present.     UC Treatments / Results  Labs (all labs ordered are listed, but only abnormal results are displayed) Labs Reviewed  POCT URINALYSIS DIP (MANUAL ENTRY) - Abnormal; Notable for the following:       Result Value   Clarity, UA cloudy (*)    Bilirubin, UA small (*)    Ketones, POC UA trace (5) (*)    Blood, UA moderate (*)    Nitrite, UA Positive (*)    Leukocytes, UA Trace (*)    All other components within normal limits  URINE CULTURE    EKG  EKG Interpretation None       Radiology No results found.  Procedures Procedures (including critical care time)  Medications Ordered in UC Medications - No data to display   Initial Impression / Assessment and Plan / UC Course  I have reviewed the triage vital signs and the nursing notes.  Pertinent labs & imaging results that were available during my care of the patient were reviewed by me and considered in my medical decision making (see chart for details).  Clinical Course   Urine culture pending. Begin Cipro 250mg  BID at patient's request. Continue increased fluid intake. If symptoms become significantly worse during the night or over the weekend, proceed to the local emergency room.  Followup with Family Doctor if not improved in one week.       Final Clinical Impressions(s) / UC Diagnoses   Final diagnoses:  Dysuria  Acute cystitis without hematuria    New Prescriptions New Prescriptions   CIPROFLOXACIN (CIPRO) 250 MG TABLET    Take 1 tablet (250 mg total) by mouth 2 (two) times daily.     Kandra Nicolas, MD 08/12/16  2316  

## 2016-07-30 NOTE — Telephone Encounter (Signed)
Patient called and states she feels she has a kidney infection but she cannot leave work. 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 She asked that one of the nurses "call in anitbiotic " for her to take. Called the patient back and advised that our office policy is that we would need for her to come in so that we can collect a urine sample for testing before calling in an antibiotic. Again the patient explained that her boss states they cannot leave or call in sick as it would count against them since this week is inventory week. Again I told the patient that I did understand that she cannot leave work but we do have urgent care that is open after hours. She asked me how much this costs and I explained to her that is cost whatever her urgent care copay is. She states before hanging up the phone " I guess I will just have to suffer." at this point the patient says thank you and hangs up.

## 2016-07-30 NOTE — Discharge Instructions (Signed)
Continue increased fluid intake. ° °If symptoms become significantly worse during the night or over the weekend, proceed to the local emergency room.  °

## 2016-08-01 ENCOUNTER — Telehealth: Payer: Self-pay | Admitting: *Deleted

## 2016-08-01 LAB — URINE CULTURE

## 2016-08-01 NOTE — Telephone Encounter (Signed)
LM with Ucx results, complete ABT and call back if she has any questions or concerns.  

## 2016-08-06 ENCOUNTER — Ambulatory Visit: Payer: 59 | Admitting: Physician Assistant

## 2016-08-08 ENCOUNTER — Other Ambulatory Visit: Payer: 59

## 2016-08-08 ENCOUNTER — Ambulatory Visit (INDEPENDENT_AMBULATORY_CARE_PROVIDER_SITE_OTHER): Payer: 59 | Admitting: Family Medicine

## 2016-08-08 ENCOUNTER — Ambulatory Visit (HOSPITAL_BASED_OUTPATIENT_CLINIC_OR_DEPARTMENT_OTHER)
Admission: RE | Admit: 2016-08-08 | Discharge: 2016-08-08 | Disposition: A | Discharge status: Home / Self Care | Payer: 59 | Source: Ambulatory Visit | Attending: Family Medicine | Admitting: Family Medicine

## 2016-08-08 ENCOUNTER — Encounter: Payer: Self-pay | Admitting: Family Medicine

## 2016-08-08 ENCOUNTER — Other Ambulatory Visit: Payer: Self-pay | Admitting: Family Medicine

## 2016-08-08 VITALS — BP 158/74 | HR 80 | Temp 98.0°F | Ht 62.0 in | Wt 125.0 lb

## 2016-08-08 DIAGNOSIS — Z87440 Personal history of urinary (tract) infections: Secondary | ICD-10-CM | POA: Diagnosis not present

## 2016-08-08 DIAGNOSIS — R319 Hematuria, unspecified: Secondary | ICD-10-CM

## 2016-08-08 DIAGNOSIS — N281 Cyst of kidney, acquired: Secondary | ICD-10-CM | POA: Diagnosis not present

## 2016-08-08 DIAGNOSIS — G8929 Other chronic pain: Secondary | ICD-10-CM

## 2016-08-08 DIAGNOSIS — R109 Unspecified abdominal pain: Secondary | ICD-10-CM | POA: Diagnosis not present

## 2016-08-08 DIAGNOSIS — R1030 Lower abdominal pain, unspecified: Secondary | ICD-10-CM

## 2016-08-08 DIAGNOSIS — N1330 Unspecified hydronephrosis: Secondary | ICD-10-CM | POA: Diagnosis not present

## 2016-08-08 DIAGNOSIS — M545 Low back pain: Secondary | ICD-10-CM

## 2016-08-08 LAB — POCT URINALYSIS DIPSTICK
BILIRUBIN UA: NEGATIVE
GLUCOSE UA: NEGATIVE
Ketones, UA: NEGATIVE
Leukocytes, UA: NEGATIVE
Nitrite, UA: NEGATIVE
Protein, UA: NEGATIVE
Urobilinogen, UA: 0.2
pH, UA: 6

## 2016-08-08 NOTE — Progress Notes (Signed)
Subjective:    Patient ID: Kathleen [name], female    DOB: 07-06-1941, 76 y.o.   MRN: [id number]  HPI 76 year old female with a prior history of recurrent urinary tract infection symptoms in today with recurrent symptoms. She was having some urinary frequency, nocturia and dysuria and came in to urgent care on January 10, approximately 9 days ago. She was started on ciprofloxacin. Very quickly she says her symptoms improved but then after about 3 days her symptoms started to return. The urine culture was positive for Klebsiella and was sensitive to ciprofloxacin that she was given. She's not had any blood in the urine. No fevers chills or sweats but she has had persistent bilateral low back pain over her hips. She's had prior low back pain and was seeing a chiropractor for a short period of time and it was extremely helpful but then had some other medical issues and was unable to go because of cost.  Says the low back pain is actually better when she lays down it really just bothers her when she's been standing for long periods of time. She says after she works and then goes home she can barely stand long enough to finish dinner before she has to sit down and rest. Pain does not radiate down into the legs though she did mention that sometimes the right hip bothers her as well.  Review of Systems   BP (!) 158/74   Pulse 80   Temp 98 F (36.7 C)   Ht 5\' 2"  (1.575 m)   Wt 125 lb (56.7 kg)   BMI 22.86 kg/m     Allergies  Allergen Reactions  . Benicar [Olmesartan] Other (See Comments)    Diizzy  . Codeine   . Hctz [Hydrochlorothiazide]     Vertigo, dizziness.   . Lisinopril Other (See Comments)    lightheaded  . Penicillins   . Streptomycin Other (See Comments)    Passed out  . Sulfonamide Derivatives     Past Medical History:  Diagnosis Date  . Cancer ([place])    Hx RT breast infiltrated ductal CA previously on tamoxifen and armidex   . Herpes simplex    lt eye  . Hypertension   .  Kidney stones    history  . MVP (mitral valve prolapse)    asymptomatic    Past Surgical History:  Procedure Laterality Date  . ABDOMINAL HYSTERECTOMY     partial  . BREAST LUMPECTOMY     RT  . CERVICAL LAMINECTOMY  1986  . SHOULDER SURGERY     rotator cuff repair  . throidectomy  1988   For cancer     Social History   Social History  . Marital status: Married    Spouse name: N/A  . Number of children: N/A  . Years of education: N/A   Occupational History  . Not on file.   Social History Main Topics  . Smoking status: Never Smoker  . Smokeless tobacco: Never Used  . Alcohol use No  . Drug use: Unknown  . 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   Other Topics Concern  . Not on file   Social History Narrative  . No narrative on file    Family History  Problem Relation Age of Onset  . Breast cancer Mother     Outpatient  Encounter Prescriptions as of 08/08/2016  Medication Sig  . ibuprofen (ADVIL,MOTRIN) 800 MG tablet Take 1 tablet (800 mg total) by mouth every 8 (eight) hours as needed.  Marland Kitchen SYNTHROID 75 MCG tablet TAKE ONE (1) TABLET BY MOUTH EVERY DAY BEFORE BREAKFAST  . ValACYclovir HCl (VALTREX PO) Take by mouth.  . [DISCONTINUED] ciprofloxacin (CIPRO) 250 MG tablet Take 1 tablet (250 mg total) by mouth 2 (two) times daily.   No facility-administered encounter medications on file as of 08/08/2016.           Objective:   Physical Exam  Constitutional: She is oriented to person, place, and time. She appears well-developed and well-nourished.  HENT:  Head: Normocephalic and atraumatic.  Cardiovascular: Normal rate, regular rhythm and normal heart sounds.   Pulmonary/Chest: Effort normal and breath sounds normal.  Musculoskeletal:  No CVA tenderness.  nontender over the spine or low back.  Normal LE strength. Normal flexion extension  rotation of the lumbar spine. That she did have pain with extension and with rotation.  Neurological: She is alert and oriented to person, place, and time.  Skin: Skin is warm and dry.  Psychiatric: She has a normal mood and affect. Her behavior is normal.          Assessment & Plan:  Urinary tract infection-repeat urinalysis today is negative except for blood. She has been off the antibiotic for 2 days at this point. We'll send for repeat culture and will send for microscopic review to see if they are whole red blood cells. Will schedule for renal US as well.  Consider referral to urology for repeat cystoscopy. But explained that first we need to confirm the presence of blood. And then if it is positive we'll need to repeat this in 1-2 weeks for confirmation.  Bilateral low back pain over the hips. Consider getting back in with a chiropractor since his treatments were extremely helpful previously. He also did a lumbar x-ray. We do not have this on file it has been done. Start with home therapy and exercising since she cannot afford physical therapy at this point. Recommend heat and NSAID. If not improving over the next 3 weeks then consider formal physical therapy.

## 2016-08-09 LAB — URINALYSIS, MICROSCOPIC ONLY
Bacteria, UA: NONE SEEN [HPF]
Casts: NONE SEEN [LPF]
Crystals: NONE SEEN [HPF]
RBC / HPF: NONE SEEN RBC/HPF (ref <=2)
Squamous Epithelial / LPF: NONE SEEN [HPF] (ref <=5)
WBC UA: NONE SEEN WBC/HPF (ref <=5)
Yeast: NONE SEEN [HPF]

## 2016-08-11 LAB — URINE CULTURE

## 2016-08-15 ENCOUNTER — Other Ambulatory Visit: Payer: Self-pay

## 2016-08-15 DIAGNOSIS — R109 Unspecified abdominal pain: Secondary | ICD-10-CM

## 2016-08-15 DIAGNOSIS — R319 Hematuria, unspecified: Secondary | ICD-10-CM

## 2016-08-17 LAB — URINE CULTURE

## 2016-08-18 ENCOUNTER — Telehealth: Payer: Self-pay | Admitting: Family Medicine

## 2016-08-18 DIAGNOSIS — N029 Recurrent and persistent hematuria with unspecified morphologic changes: Secondary | ICD-10-CM

## 2016-08-18 NOTE — Telephone Encounter (Signed)
Called pt and informed her of results of UCX. She stated that she is still having pain in back. She went to her chiropractor last Friday and has another appt Wednesday. She wanted to know if Dr. Madilyn Fireman is going to refer her to a Urologist or does she need to do a repeat UCX since the last 2 were negative. Will fwd to pcp for advice.Kathleen [name]

## 2016-08-18 NOTE — Telephone Encounter (Signed)
Pt called. She is still hurting although uti results were negative.

## 2016-08-19 NOTE — Telephone Encounter (Signed)
Pt informed of recommendations. She stated that the chiropractor informed her that she has a "slipped" vertebrae around the 4th one. She stated that she does not want to take any trazadone. And has a f/u appt with him on tomorrow. Will place referral.Kathleen [name], [place] Crocker

## 2016-08-19 NOTE — Telephone Encounter (Signed)
I would say since last 2 were negative let go ahead and refer to urology for further evaluation. Okay to place referral. As far as the back pain and give it a couple more trazodone the chiropractor sometimes it can take several sessions to get things aligned and feels some improvement.

## 2016-10-02 DIAGNOSIS — M1712 Unilateral primary osteoarthritis, left knee: Secondary | ICD-10-CM | POA: Diagnosis not present

## 2016-10-02 DIAGNOSIS — G835 Locked-in state: Secondary | ICD-10-CM | POA: Diagnosis not present

## 2016-10-02 DIAGNOSIS — Z79899 Other long term (current) drug therapy: Secondary | ICD-10-CM | POA: Diagnosis not present

## 2016-10-02 DIAGNOSIS — Z885 Allergy status to narcotic agent status: Secondary | ICD-10-CM | POA: Diagnosis not present

## 2016-10-02 DIAGNOSIS — R52 Pain, unspecified: Secondary | ICD-10-CM | POA: Diagnosis not present

## 2016-10-02 DIAGNOSIS — M25562 Pain in left knee: Secondary | ICD-10-CM | POA: Diagnosis not present

## 2016-10-02 DIAGNOSIS — Z880 Allergy status to penicillin: Secondary | ICD-10-CM | POA: Diagnosis not present

## 2016-10-07 DIAGNOSIS — M2392 Unspecified internal derangement of left knee: Secondary | ICD-10-CM | POA: Diagnosis not present

## 2016-10-09 DIAGNOSIS — M2392 Unspecified internal derangement of left knee: Secondary | ICD-10-CM | POA: Insufficient documentation

## 2016-10-09 HISTORY — DX: Unspecified internal derangement of left knee: M23.92

## 2016-11-05 DIAGNOSIS — Z85828 Personal history of other malignant neoplasm of skin: Secondary | ICD-10-CM | POA: Diagnosis not present

## 2016-11-05 DIAGNOSIS — D1801 Hemangioma of skin and subcutaneous tissue: Secondary | ICD-10-CM | POA: Diagnosis not present

## 2016-11-05 DIAGNOSIS — L815 Leukoderma, not elsewhere classified: Secondary | ICD-10-CM | POA: Diagnosis not present

## 2016-11-05 DIAGNOSIS — L570 Actinic keratosis: Secondary | ICD-10-CM | POA: Diagnosis not present

## 2016-11-05 DIAGNOSIS — L821 Other seborrheic keratosis: Secondary | ICD-10-CM | POA: Diagnosis not present

## 2016-11-10 ENCOUNTER — Other Ambulatory Visit: Payer: Self-pay | Admitting: *Deleted

## 2016-11-10 MED ORDER — SYNTHROID 75 MCG PO TABS: ORAL_TABLET | ORAL | 3 refills | Status: DC

## 2016-11-26 ENCOUNTER — Ambulatory Visit (INDEPENDENT_AMBULATORY_CARE_PROVIDER_SITE_OTHER): Payer: 59 | Admitting: Family Medicine

## 2016-11-26 ENCOUNTER — Encounter: Payer: Self-pay | Admitting: Family Medicine

## 2016-11-26 VITALS — BP 160/67 | HR 81 | Ht 62.0 in | Wt 122.0 lb

## 2016-11-26 DIAGNOSIS — L84 Corns and callosities: Secondary | ICD-10-CM | POA: Diagnosis not present

## 2016-11-26 DIAGNOSIS — M4126 Other idiopathic scoliosis, lumbar region: Secondary | ICD-10-CM

## 2016-11-26 DIAGNOSIS — R7301 Impaired fasting glucose: Secondary | ICD-10-CM

## 2016-11-26 DIAGNOSIS — H6123 Impacted cerumen, bilateral: Secondary | ICD-10-CM

## 2016-11-26 DIAGNOSIS — E039 Hypothyroidism, unspecified: Secondary | ICD-10-CM | POA: Diagnosis not present

## 2016-11-26 DIAGNOSIS — M5442 Lumbago with sciatica, left side: Secondary | ICD-10-CM

## 2016-11-26 LAB — POCT GLYCOSYLATED HEMOGLOBIN (HGB A1C): Hemoglobin A1C: 5.8

## 2016-11-26 MED ORDER — PREDNISONE 10 MG PO TABS: ORAL_TABLET | ORAL | 0 refills | Status: DC

## 2016-11-26 NOTE — Patient Instructions (Addendum)
Make an appointment with sport medication.   Check with your insurance to see if will cover Shingrix.

## 2016-11-26 NOTE — Progress Notes (Signed)
Subjective:    CC: cerumen impaction and back pain.   HPI: She would also like to discuss a referral for scoliosis.  Bilateral cerumen impaction-she's requesting to have her ears irrigated today.  Impaired fasting glucose-no increased thirst or urination. No symptoms consistent with hypoglycemia.  Low back pain - She's also been having a lot more low back pain. She says it's even starting to affect her gait any ventricular on her noticing that she's walking differently and almost limping. She actually recently had an event back in March where she rates her knee afford an outward on the bed and suddenly dislocated her knee. She went to the emergency department. She says it was extremely painful. She then followed up with orthopedist about 5 days later. At that point in time she was actually feeling much better in regards to her knee. She mentioned it to him that she often has pain radiating down through her upper leg to her knee. He fell he does have actually probably be coming from her back. 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 She is expected to lift up to 45 pounds. 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 It's making it more difficult so she when she has to work multiple days in a row or longer than 8 hour shift. Her goal is to actually keep working she like to for at least another couple years of possible. She mostly takes Tylenol as needed. No more than 2 and 1 day. It does provide some relief for her.   Hypothyroid - she also needs a refill on her thyroid medication. She is doing well without any problems. Lab Results  Component Value Date   TSH 0.97 04/16/2016   She also has a lesion on the bottom of her right foot. She was very supportive shoewear to work and says it's really starting to bother her more. It's been there for months.  Past medical history, Surgical history, Family history not pertinant except as noted below, Social history,  Allergies, and medications have been entered into the medical record, reviewed, and corrections made.   Review of Systems: No fevers, chills, night sweats, weight loss, chest pain, or shortness of breath.   Objective:    General: Well Developed, well nourished, and in no acute distress.  Neuro: Alert and oriented x3, extra-ocular muscles intact, sensation grossly intact.  HEENT: Normocephalic, atraumatic  Skin: Warm and dry, no rashes. Cardiac: Regular rate and rhythm, no murmurs rubs or gallops, no lower extremity edema.  Respiratory: Clear to auscultation bilaterally. Not using accessory muscles, speaking in full sentences.   Impression and Recommendations:    IFG - stable.  Well controlled.  A1C if 5.8.  Continue to work on healthy diet and regular exercise.  Ceremun impaction- iirgation performed.  Patient tolerated well.  Low Back Pain- discussed referring her to sports medicine for further evaluation. Discussed the need for additional treatment. She does get some significant relief with chiropractic care but it's quite expensive and she has to go very frequently. Plus at this point her pain is had been increasing recently so we may need to really start looking at alternative therapies. She may benefit from formal physical therapy versus maybe injections. Her goal is to keep working. I'm going to temporarily place her on work restrictions as we are working her up further and hopefully improving her pain and getting her treated. Discussed that with one of our sports med.says she leaves  today.  Hypothyroidism-and let her know that it was filled April 23 and has 3 refills that she should begin until August.  Corn-she has a corn on the bottom of her right foot. Discussed options including using over-the-counter acid for [place] callus removing.  We could also consider some debridement and cryotherapy. She would like to opt for that that did encourage her to schedule it when she's can have a  few days off of work because it will be very sore for about a week after treatment.  Indication: Cerumen impaction of the ear(s) Medical necessity statement: On physical examination, cerumen impairs clinically significant portions of the external auditory canal, and tympanic membrane. Noted obstructive, copious cerumen that cannot be removed without magnification and instrumentations requiring physician skills Consent: Discussed benefits and risks of procedure and verbal consent obtained Procedure: Patient was prepped for the procedure. Utilized an otoscope to assess and take note of the ear canal, the tympanic membrane, and the presence, amount, and placement of the cerumen. Gentle water irrigation and soft plastic curette was utilized to remove cerumen.  Post procedure examination: shows cerumen was completely removed. Patient tolerated procedure well. The patient is made aware that they may experience temporary vertigo, temporary hearing loss, and temporary discomfort. If these symptom last for more than 24 hours to call the clinic or proceed to the ED.

## 2016-12-03 ENCOUNTER — Ambulatory Visit (INDEPENDENT_AMBULATORY_CARE_PROVIDER_SITE_OTHER): Payer: 59 | Admitting: Sports Medicine

## 2016-12-03 ENCOUNTER — Ambulatory Visit (INDEPENDENT_AMBULATORY_CARE_PROVIDER_SITE_OTHER): Payer: 59

## 2016-12-03 DIAGNOSIS — M47816 Spondylosis without myelopathy or radiculopathy, lumbar region: Secondary | ICD-10-CM

## 2016-12-03 DIAGNOSIS — M4186 Other forms of scoliosis, lumbar region: Secondary | ICD-10-CM

## 2016-12-03 MED ORDER — PREDNISONE 50 MG PO TABS: ORAL_TABLET | ORAL | 0 refills | Status: DC

## 2016-12-03 NOTE — Progress Notes (Signed)
   Subjective:    I'm seeing this patient as a consultation for:  Dr. Beatrice Lecher  CC: Low back pain  HPI: This is a pleasant 76 year old female with a long history of lumbar degenerative changes, degenerative scoliosis, and back pain. She localizes her back pain on both sides but worse on the left, with radiation down the left leg to the top and bottom of the left foot. She also has significant axial pain on the left. No bowel or bladder dysfunction, saddle numbness, constitutional symptoms or trauma. She still works hard unloading trucks.  Past medical history:  Negative.  See flowsheet/record as well for more information.  Surgical history: Negative.  See flowsheet/record as well for more information.  Family history: Negative.  See flowsheet/record as well for more information.  Social history: Negative.  See flowsheet/record as well for more information.  Allergies, and medications have been entered into the medical record, reviewed, and no changes needed.   Review of Systems: No headache, visual changes, nausea, vomiting, diarrhea, constipation, dizziness, abdominal pain, skin rash, fevers, chills, night sweats, weight loss, swollen lymph nodes, body aches, joint swelling, muscle aches, chest pain, shortness of breath, mood changes, visual or auditory hallucinations.   Objective:   General: Well Developed, well nourished, and in no acute distress.  Neuro/Psych: Alert and oriented x3, extra-ocular muscles intact, able to move all 4 extremities, sensation grossly intact. Skin: Warm and dry, no rashes noted.  Respiratory: Not using accessory muscles, speaking in full sentences, trachea midline.  Cardiovascular: Pulses palpable, no extremity edema. Abdomen: Does not appear distended. Back Exam:  Inspection: Scoliosis with left rib hump  Motion: Flexion 45 deg, Extension 45 deg, Side Bending to 45 deg bilaterally,  Rotation to 45 deg bilaterally  SLR laying: Negative  XSLR  laying: Negative  Palpable tenderness: None. FABER: negative. Sensory change: Gross sensation intact to all lumbar and sacral dermatomes.  Reflexes: 2+ at both patellar tendons, 2+ at achilles tendons, Babinski's downgoing.  Strength at foot  Plantar-flexion: 5/5 Dorsi-flexion: 5/5 Eversion: 5/5 Inversion: 5/5  Leg strength  Quad: 5/5 Hamstring: 5/5 Hip flexor: 5/5 Hip abductors: 5/5  Gait unremarkable.  CT of the lumbar spine from the recent past shows multilevel degenerative changes, degenerative scoliosis and severe left L4-L5 facet arthritis.  Impression and Recommendations:   This case required medical decision making of moderate complexity.  Lumbar spondylosis With left L5 radiculopathy, and a left L4-L5 facet arthritis with further degenerative changes but lesser so it every other level. Formal physical therapy, prednisone, she has ibuprofen 800 that she can use intermittently, x-rays. Return in 4 weeks.

## 2016-12-03 NOTE — Assessment & Plan Note (Signed)
With left L5 radiculopathy, and a left L4-L5 facet arthritis with further degenerative changes but lesser so it every other level. Formal physical therapy, prednisone, she has ibuprofen 800 that she can use intermittently, x-rays. Return in 4 weeks.

## 2016-12-09 ENCOUNTER — Telehealth: Payer: Self-pay

## 2016-12-09 MED ORDER — GABAPENTIN 100 MG PO CAPS: 100.0000 mg | ORAL_CAPSULE | Freq: Two times a day (BID) | ORAL | 11 refills | Status: DC

## 2016-12-09 MED ORDER — PREDNISONE 10 MG (48) PO TBPK: ORAL_TABLET | Freq: Every day | ORAL | 0 refills | Status: DC

## 2016-12-09 NOTE — Telephone Encounter (Signed)
Pt states she is still having severe pain in left leg after working 12 hours and couldn't go to work. States he first PT appointment is tomorrow and does not feel she can make it due to pain. She has been using her ibuprofen but it's not helping much with pain. Please advise.

## 2016-12-09 NOTE — Telephone Encounter (Signed)
Left VM with information.  

## 2016-12-09 NOTE — Telephone Encounter (Signed)
Adding a taper course of Prednisone, as well as Gabapentin to start out of bed time.

## 2016-12-10 ENCOUNTER — Ambulatory Visit (INDEPENDENT_AMBULATORY_CARE_PROVIDER_SITE_OTHER): Payer: 59 | Admitting: Physical Therapy

## 2016-12-10 ENCOUNTER — Encounter: Payer: Self-pay | Admitting: Physical Therapy

## 2016-12-10 DIAGNOSIS — M6281 Muscle weakness (generalized): Secondary | ICD-10-CM

## 2016-12-10 DIAGNOSIS — M5442 Lumbago with sciatica, left side: Secondary | ICD-10-CM | POA: Diagnosis present

## 2016-12-10 DIAGNOSIS — R293 Abnormal posture: Secondary | ICD-10-CM

## 2016-12-10 DIAGNOSIS — M62830 Muscle spasm of back: Secondary | ICD-10-CM

## 2016-12-10 NOTE — Patient Instructions (Addendum)
Seated Stretch: Side Bend    With feet on floor and buttocks firmly on chair, slowly slide one arm toward floor, to tolerance.This should NOT be painful.  Hold __5-10__ seconds. Repeat to other side. Repeat __5__ times. Do _2___ sessions per day.  Knee-to-Chest Stretch: Bilateral   With hands behind knees, pull both knees in to chest until a comfortable stretch is felt in lower back and buttocks. Keep back relaxed. Hold __20-30__ seconds. Repeat __2__ times per set. Do __1__ sets per session. Do _2___ sessions per day.   Lower Trunk Rotation Stretch    Keeping back flat and feet together, rotate knees to left side. Hold _5___ seconds. Repeat __10__ times per set. Do __1__ sets per session. Do _2___ sessions per day.  Flexion (Passive)    Sitting upright, slide forearms forward along table, bending from the waist until a stretch is felt. Hold _15___ seconds. Repeat __2-3__ times. Do _2___ sessions per day.   [facility] [place],  [address]  [phone] (office) [phone] (fax)

## 2016-12-10 NOTE — Therapy (Signed)
[place], [place], [address] Phone: [phone]   Fax:  [phone]  Physical Therapy Evaluation  Patient Details  Name: Kathleen [name] MRN: [id number] Date of Birth: 10-05-1940 Referring Provider: Dr Dianah Field  Encounter Date: 12/10/2016      PT End of Session - 12/10/16 1108    Visit Number 1   Number of Visits 8   Date for PT Re-Evaluation 01/07/17   PT Start Time 1108   PT Stop Time 1212   PT Time Calculation (min) 64 min   Activity Tolerance Patient tolerated treatment well;No increased pain   Behavior During Therapy WFL for tasks assessed/performed      Past Medical History:  Diagnosis Date  . Cancer ([place])    Hx RT breast infiltrated ductal CA previously on tamoxifen and armidex   . Herpes simplex    lt eye  . Hypertension   . Kidney stones    history  . MVP (mitral valve prolapse)    asymptomatic    Past Surgical History:  Procedure Laterality Date  . ABDOMINAL HYSTERECTOMY     partial  . BREAST LUMPECTOMY     RT  . CERVICAL LAMINECTOMY  1986  . SHOULDER SURGERY     rotator cuff repair  . throidectomy  1988   For cancer     There were no vitals filed for this visit.       Subjective Assessment - 12/10/16 1108    Subjective Pt report she noticed pain into her Lt LE about a couple months ago.  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 She asks for help if needed.  She has started to lean to the Rt and people have started to notice.  She reports she hurt her Lt knee and has a possible meniscus tear ( 6-8 wks ago) after the back pain started.    Pertinent History sclerosis, 18 yr survior of breast cancer. 1989 cervical laminectomy, osteopenic in her femur none in her back.    How long can you sit comfortably? no limitation - has relief   How long can you stand comfortably? within 5'    How long can you walk comfortably? varies,  sometimes walking makes it better other times ( Mondays works 10-12 hr shift and has increased pain )    Diagnostic tests x-rays   Patient Stated Goals straighten up her body so she doesn't lean to the side, keep working as she doesn't want to retire. Walk dogs again,    Currently in Pain? Yes   Pain Score 3    Pain Location Back   Pain Orientation Right   Pain Descriptors / Indicators Sore   Pain Type Acute pain   Pain Radiating Towards Lt LE into foot  - opposite side of low back, sometimes pain is in both side.    Pain Onset More than a month ago   Pain Frequency Constant            [facility] PT Assessment - 12/10/16 0001      Assessment   Medical Diagnosis lumbar spondylosis   Referring Provider Dr Dianah Field   Onset Date/Surgical Date 10/10/16   Hand Dominance Right   Next MD Visit 6/18   Prior Therapy chiropractic care only     Precautions   Precautions None     Balance Screen   Has the patient fallen in the past 6 months No  [place] residence   Home Layout Two level  able to go up/down stairs fine     Prior Function   Level of Independence Independent   Vocation Full time employment   Vocation Requirements lifting, 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)    Leisure rest, play with dogs     Observation/Other Assessments   Focus on Therapeutic Outcomes (FOTO)  39% limited     Posture/Postural Control   Posture/Postural Control Postural limitations   Postural Limitations Forward head  lean to the Rt, shoulder complex lower   Posture Comments spine curves to the Rt from lumbar through thoracic      ROM / Strength   AROM / PROM / Strength AROM;Strength     AROM   AROM Assessment Site Lumbar   Lumbar Flexion to top of shoes   Lumbar Extension WNL however rotates to the Rt    Lumbar - Right Side Bend WNL   Lumbar - Left Side Bend WNL - however pain with return to stand catch on Rt hip      Strength   Strength Assessment Site  Hip;Knee;Ankle   Right/Left Hip Right;Left   Right Hip Flexion 5/5   Right Hip Extension 5/5   Right Hip ABduction --  5-/5   Left Hip Flexion 5/5   Left Hip Extension 5/5   Left Hip ABduction 4+/5   Right/Left Knee --  bilat WNL   Right/Left Ankle --  Rt WNL, Lt DF 4+/5     Palpation   Spinal mobility slight hypomobility    Palpation comment very tight in Lt lumbar paraspinals and QL, and Rt gluts and piriformis      Special Tests    Special Tests --  (-) lumbar and SIJ tests     Ambulation/Gait   Ambulation/Gait Yes   Ambulation/Gait Assistance 7: Independent   Ambulation Surface Level   Gait Comments trunk lean to the Rt                    [facility] Adult PT Treatment/Exercise - 12/10/16 0001      Self-Care   Self-Care Other Self-Care Comments   Other Self-Care Comments  Pt educated on self massage with ball to low back and hips (rationale,technique). Pt verbalized understanding and returned demo.      Exercises   Exercises Shoulder;Lumbar     Lumbar Exercises: Stretches   Single Knee to Chest Stretch 1 rep;20 seconds   Double Knee to Chest Stretch 2 reps;30 seconds   Lower Trunk Rotation 5 reps;10 seconds  arms in T     Lumbar Exercises: Seated   Other Seated Lumbar Exercises Rt lumbar side stretch x 10 sec x 3 reps     Shoulder Exercises: Stretch   Table Stretch - Flexion 3 reps;20 seconds  for lat stretch     Modalities   Modalities Electrical Stimulation;Moist Heat     Moist Heat Therapy   Number Minutes Moist Heat 15 Minutes   Moist Heat Location Lumbar Spine     Electrical Stimulation   Electrical Stimulation Location bilat SI joint/lumbar paraspinals.   Electrical Stimulation Action IFC   Electrical Stimulation Parameters  to tolerance    Electrical Stimulation Goals Pain                PT Education - 12/10/16 1136    Education provided Yes   Education Details POC; HEP    Person(s) Educated Patient  Methods  Explanation;Demonstration;Tactile cues;Verbal cues;Handout   Comprehension Returned demonstration;Verbalized understanding             PT Long Term Goals - 12/10/16 1433      PT LONG TERM GOAL #1   Title I with advanced HEP ( 01/07/17)    Time 4   Period Weeks   Status New     PT LONG TERM GOAL #2   Title improve FOTO =/< 33% limited ( 01/07/17)    Time 4   Period Weeks   Status New     PT LONG TERM GOAL #3   Title have decreased muscular tightness to allow her to walk and stand with upright posture ( 01/07/17)    Time 4   Period Weeks   Status New     PT LONG TERM GOAL #4   Title demo bilat hip abduction =/> 5-/5 ( 01/07/17)    Time 4   Period Weeks   Status New     PT LONG TERM GOAL #5   Title report no more than 1/10 pain after her long work shift on Mondays ( 01/07/17)    Time 4   Period Weeks   Status New               Plan - 12/10/16 1429    Clinical Impression Statement 76 yo female presents for moderate complexity PT eval for lumbar spondylosis.  She ambulates with trunk lean to the Rt, significant pain in the Lt low back and Rt buttocks.  She has multiple trigger points in the Lt lumbar paraspinals, QL and Rt buttocks.  The Rt sided pain is most likely due to gait devaitions from Lt sided tightness.  She has weakness in her hips and core as well. Kathleen [name] is very active, still works full time and wishes to continue doing so.  She has to be able to climb ladders, lift up to 40# when needed.    Rehab Potential Excellent   PT Frequency 2x / week   PT Duration 4 weeks   PT Treatment/Interventions Moist Heat;Ultrasound;Therapeutic exercise;Dry needling;Taping;Manual techniques;Cryotherapy;Electrical Stimulation;Patient/family education   PT Next Visit Plan possible DN to lumbar paraspinals, Lt QL along with manual work, then core stability and modalities for pain   Consulted and Agree with Plan of Care Patient      Patient will benefit from skilled  therapeutic intervention in order to improve the following deficits and impairments:  Pain, Postural dysfunction, Decreased strength, Increased muscle spasms  Visit Diagnosis: Acute bilateral low back pain with left-sided sciatica - Plan: PT plan of care cert/re-cert  Muscle weakness (generalized) - Plan: PT plan of care cert/re-cert  Muscle spasm of back - Plan: PT plan of care cert/re-cert  Abnormal posture - Plan: PT plan of care cert/re-cert     Problem List Patient Active Problem List   Diagnosis Date Noted  . Lumbar spondylosis 12/03/2016  . Corn of foot 11/26/2016  . History of breast cancer 11/09/2012  . Benign hematuria 07/05/2010  . POSTMENOPAUSAL STATUS 03/29/2010  . IMPAIRED FASTING GLUCOSE 12/11/2008  . LEG PAIN, BILATERAL 01/20/2008  . HIP PAIN, RIGHT 01/05/2008  . Disorder of bone and cartilage 02/26/2007  . Hypothyroidism 04/28/2006  . White coat hypertension 04/28/2006  . MITRAL VALVE DISORDER 04/28/2006    Kathleen [name],Kathleen [name] 12/10/2016, 2:38 PM  [facility] [place], [place], [address] Phone: [phone]   Fax:  [phone]  Name: Kathleen [name] MRN:  [id number] Date of Birth: 1941/06/22

## 2016-12-17 ENCOUNTER — Ambulatory Visit (INDEPENDENT_AMBULATORY_CARE_PROVIDER_SITE_OTHER): Payer: 59 | Admitting: Physical Therapy

## 2016-12-17 DIAGNOSIS — M6281 Muscle weakness (generalized): Secondary | ICD-10-CM

## 2016-12-17 DIAGNOSIS — R293 Abnormal posture: Secondary | ICD-10-CM | POA: Diagnosis not present

## 2016-12-17 DIAGNOSIS — M62830 Muscle spasm of back: Secondary | ICD-10-CM | POA: Diagnosis not present

## 2016-12-17 DIAGNOSIS — M5442 Lumbago with sciatica, left side: Secondary | ICD-10-CM | POA: Diagnosis not present

## 2016-12-17 NOTE — Patient Instructions (Addendum)

## 2016-12-17 NOTE — Therapy (Signed)
[place], [place], [address] Phone: [phone]   Fax:  [phone]  Physical Therapy Treatment  Patient Details  Name: Kathleen [name] MRN: [id number] Date of Birth: [birth year] Referring Provider: Dr Dianah Field  Encounter Date: 12/17/2016      PT End of Session - 12/17/16 1020    Visit Number 2   Number of Visits 8   Date for PT Re-Evaluation 01/07/17   PT Start Time 1020   PT Stop Time 1116   PT Time Calculation (min) 56 min      Past Medical History:  Diagnosis Date  . Cancer ([place])    Hx RT breast infiltrated ductal CA previously on tamoxifen and armidex   . Herpes simplex    lt eye  . Hypertension   . Kidney stones    history  . MVP (mitral valve prolapse)    asymptomatic    Past Surgical History:  Procedure Laterality Date  . ABDOMINAL HYSTERECTOMY     partial  . BREAST LUMPECTOMY     RT  . CERVICAL LAMINECTOMY  1986  . SHOULDER SURGERY     rotator cuff repair  . throidectomy  1988   For cancer     There were no vitals filed for this visit.      Subjective Assessment - 12/17/16 1020    Subjective Pt feels a lot better, she is able to stand up straight.  She has not worked a 12 shift since seeing [place]   Patient Stated Goals straighten up her body so she doesn't lean to the side, keep working as she doesn't want to retire. Walk dogs again,    Currently in Pain? No/denies  only has pain with certain motions, like side bending in bilat low back                          [facility] Adult PT Treatment/Exercise - 12/17/16 0001      Lumbar Exercises: Aerobic   UBE (Upper Arm Bike) standing L 3 alt directions, total 4' BWD, 1 min FWD  VC to keep core engaged.      Lumbar Exercises: Supine   Bridge 5 reps  6 sets with clams using green band TA contraction with knee in/out 3x10 Standing trunk rotation with red band, required VC and physical cues for correct  form.      Modalities   Modalities Electrical Stimulation;Moist Heat     Moist Heat Therapy   Number Minutes Moist Heat 15 Minutes   Moist Heat Location Lumbar Spine     Electrical Stimulation   Electrical Stimulation Location bilat SI joint/lumbar paraspinals.   Electrical Stimulation Action IFC   Electrical Stimulation Parameters  to tolerance   Electrical Stimulation Goals Tone;Pain     Manual Therapy   Manual Therapy Soft tissue mobilization   Soft tissue mobilization STM to bilat lumbar paraspinals          Trigger Point Dry Needling - 12/17/16 1048    Consent Given? Yes   Education Handout Provided Yes   Muscles Treated Upper Body Longissimus  bilat    Longissimus Response Palpable increased muscle length;Twitch response elicited              PT Education - 12/17/16 1045    Education provided Yes   Education Details HEP , DN   Person(s) Educated Patient   Methods Explanation;Demonstration;Handout   Comprehension Verbal cues  required;Returned demonstration;Tactile cues required;Need further instruction             PT Long Term Goals - 12/17/16 1026      PT LONG TERM GOAL #1   Title I with advanced HEP ( 01/07/17)    Status On-going     PT LONG TERM GOAL #2   Title improve FOTO =/< 33% limited ( 01/07/17)    Status On-going     PT LONG TERM GOAL #3   Title have decreased muscular tightness to allow her to walk and stand with upright posture ( 01/07/17)    Status On-going     PT LONG TERM GOAL #4   Title demo bilat hip abduction =/> 5-/5 ( 01/07/17)    Status On-going     PT LONG TERM GOAL #5   Title report no more than 1/10 pain after her long work shift on Mondays ( 01/07/17)    Status On-going               Plan - 12/17/16 1137    Clinical Impression Statement This is Suprina's second visit, no goals met.  She is standing up almost straight and reports less pain.  She is weak through her core and still has some pain in the SIJ areas  and low back along with muscular tightness.    Rehab Potential Excellent   PT Frequency 2x / week   PT Treatment/Interventions Moist Heat;Ultrasound;Therapeutic exercise;Dry needling;Taping;Manual techniques;Cryotherapy;Electrical Stimulation;Patient/family education   PT Next Visit Plan assess response to DN and new HEP, progress as able   Consulted and Agree with Plan of Care Patient      Patient will benefit from skilled therapeutic intervention in order to improve the following deficits and impairments:  Pain, Postural dysfunction, Decreased strength, Increased muscle spasms  Visit Diagnosis: Acute bilateral low back pain with left-sided sciatica  Muscle weakness (generalized)  Muscle spasm of back  Abnormal posture     Problem List Patient Active Problem List   Diagnosis Date Noted  . Lumbar spondylosis 12/03/2016  . Corn of foot 11/26/2016  . History of breast cancer 11/09/2012  . Benign hematuria 07/05/2010  . POSTMENOPAUSAL STATUS 03/29/2010  . IMPAIRED FASTING GLUCOSE 12/11/2008  . LEG PAIN, BILATERAL 01/20/2008  . HIP PAIN, RIGHT 01/05/2008  . Disorder of bone and cartilage 02/26/2007  . Hypothyroidism 04/28/2006  . White coat hypertension 04/28/2006  . MITRAL VALVE DISORDER 04/28/2006    Jeral Pinch PT  12/17/2016, 11:39 AM  [place], [place], [address] Phone: [phone]   Fax:  [phone]  Name: YULONDA WHEELING MRN: [id number] Date of Birth: 1941-06-25

## 2016-12-19 ENCOUNTER — Other Ambulatory Visit: Payer: Self-pay | Admitting: Family Medicine

## 2016-12-19 DIAGNOSIS — G5702 Lesion of sciatic nerve, left lower limb: Secondary | ICD-10-CM

## 2016-12-24 ENCOUNTER — Encounter: Payer: Self-pay | Admitting: Physical Therapy

## 2016-12-24 ENCOUNTER — Ambulatory Visit (INDEPENDENT_AMBULATORY_CARE_PROVIDER_SITE_OTHER): Payer: 59 | Admitting: Physical Therapy

## 2016-12-24 DIAGNOSIS — M6281 Muscle weakness (generalized): Secondary | ICD-10-CM

## 2016-12-24 DIAGNOSIS — M62830 Muscle spasm of back: Secondary | ICD-10-CM

## 2016-12-24 DIAGNOSIS — R293 Abnormal posture: Secondary | ICD-10-CM

## 2016-12-24 DIAGNOSIS — M5442 Lumbago with sciatica, left side: Secondary | ICD-10-CM | POA: Diagnosis not present

## 2016-12-24 NOTE — Therapy (Signed)
[place], [place], [address] Phone: [phone]   Fax:  [phone]  Physical Therapy Treatment  Patient Details  Name: Kathleen [name] MRN: [id number] Date of Birth: [birth year] Referring Provider: Dr Dianah Field  Encounter Date: 12/24/2016      PT End of Session - 12/24/16 0950    Visit Number 3   Number of Visits 8   Date for PT Re-Evaluation 01/07/17   PT Start Time 0849   PT Stop Time 1004   PT Time Calculation (min) 75 min   Activity Tolerance Patient limited by pain      Past Medical History:  Diagnosis Date  . Cancer ([place])    Hx RT breast infiltrated ductal CA previously on tamoxifen and armidex   . Herpes simplex    lt eye  . Hypertension   . Kidney stones    history  . MVP (mitral valve prolapse)    asymptomatic    Past Surgical History:  Procedure Laterality Date  . ABDOMINAL HYSTERECTOMY     partial  . BREAST LUMPECTOMY     RT  . CERVICAL LAMINECTOMY  1986  . SHOULDER SURGERY     rotator cuff repair  . throidectomy  1988   For cancer     There were no vitals filed for this visit.      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    Patient Stated Goals straighten up her body so she doesn't lean to the side, keep working as she doesn't want to retire. Walk dogs again,    Currently in Pain? Yes   Pain Score 5    Pain Location Back   Pain Orientation Right;Left   Pain Descriptors / Indicators Stabbing;Sharp;Dull   Pain Type Acute pain   Pain Onset More than a month ago   Pain Frequency Intermittent   Aggravating Factors  repetitive bending twisting long shifts at work.    Pain Relieving Factors sitting                         [facility] Adult PT Treatment/Exercise -  12/24/16 0001      Self-Care   Self-Care ADL's   ADL's body mechanics, lifting, twisting as relates to her work.      Lumbar Exercises: Stretches   Piriformis Stretch 2 reps;30 seconds  each side     Lumbar Exercises: Standing   Other Standing Lumbar Exercises lateral shifts, hips to Rt, ribs to Lt this relleived some pain     Modalities   Modalities Electrical Stimulation;Moist Heat     Moist Heat Therapy   Number Minutes Moist Heat 20 Minutes   Moist Heat Location Lumbar Spine  Rt buttocks     Electrical Stimulation   Electrical Stimulation Location bilat SI joint/lumbar paraspinals.   Electrical Stimulation Action IFC   Electrical Stimulation Parameters  to tolerance   Electrical Stimulation Goals Tone;Pain     Manual Therapy   Manual Therapy Soft tissue mobilization   Soft tissue mobilization STM to bilat lumbar paraspinals  and Rt buttocks          Trigger Point Dry Needling - 12/24/16 0924    Consent Given? Yes   Education Handout Provided No  Muscles Treated Upper Body Longissimus   Muscles Treated Lower Body Gluteus maximus;Piriformis;Gluteus minimus  Rt side   Longissimus Response Twitch response elicited;Palpable increased muscle length  bilat L2-4 with stim   Gluteus Maximus Response Palpable increased muscle length;Twitch response elicited  Rt   Gluteus Minimus Response Palpable increased muscle length;Twitch response elicited  Rt   Piriformis Response Twitch response elicited;Palpable increased muscle length  Rt              PT Education - 12/24/16 0935    Education provided Yes   Education Details HEP, body mechanices   Person(s) Educated Patient   Methods Explanation;Demonstration;Handout   Comprehension Returned demonstration;Verbalized understanding             PT Long Term Goals - 12/24/16 0240      PT LONG TERM GOAL #1   Title I with advanced HEP ( 01/07/17)    Status On-going     PT LONG TERM GOAL #2   Title improve  FOTO =/< 33% limited ( 01/07/17)    Status On-going     PT LONG TERM GOAL #3   Title have decreased muscular tightness to allow her to walk and stand with upright posture ( 01/07/17)    Status On-going     PT LONG TERM GOAL #4   Title demo bilat hip abduction =/> 5-/5 ( 01/07/17)    Status On-going     PT LONG TERM GOAL #5   Title report no more than 1/10 pain after her long work shift on Mondays ( 01/07/17)    Status On-going               Plan - 12/24/16 0855    Clinical Impression Statement Arayla presented leaning to the Rt again after having her pain return over the weekend and after long shifts at work.  Strongly encouraged her to ask for help at work as well as reviewed safe movement patterns to protect her back with lifting and unpacking boxes. She was very tight in bilat lumbar paraspinals and rt buttocks, responded well to manual work and [employer].  Had less pain when she left and was walking straighter.    Rehab Potential Excellent   PT Frequency 2x / week   PT Duration 4 weeks   PT Treatment/Interventions Moist Heat;Ultrasound;Therapeutic exercise;Dry needling;Taping;Manual techniques;Cryotherapy;Electrical Stimulation;Patient/family education   PT Next Visit Plan assess response to DN and review how using new body mechanics has gone.    Consulted and Agree with Plan of Care Patient      Patient will benefit from skilled therapeutic intervention in order to improve the following deficits and impairments:  Pain, Postural dysfunction, Decreased strength, Increased muscle spasms  Visit Diagnosis: Acute bilateral low back pain with left-sided sciatica  Muscle weakness (generalized)  Muscle spasm of back  Abnormal posture     Problem List Patient Active Problem List   Diagnosis Date Noted  . Lumbar spondylosis 12/03/2016  . Corn of foot 11/26/2016  . History of breast cancer 11/09/2012  . Benign hematuria 07/05/2010  . POSTMENOPAUSAL STATUS 03/29/2010  .  IMPAIRED FASTING GLUCOSE 12/11/2008  . LEG PAIN, BILATERAL 01/20/2008  . HIP PAIN, RIGHT 01/05/2008  . Disorder of bone and cartilage 02/26/2007  . Hypothyroidism 04/28/2006  . White coat hypertension 04/28/2006  . MITRAL VALVE DISORDER 04/28/2006    Jeral Pinch PT  12/24/2016, 9:52 AM  [facility] [place], [place], [address] Phone: [phone]  Fax:  [phone]  Name: Kathleen [name] MRN: [id number] Date of Birth: 07/18/1941

## 2016-12-24 NOTE — Patient Instructions (Addendum)
Thoracolumbar Side-Bend: Manual (Standing)    Left hand on hip, other hand on rib cage, gently press hands together, shifting hips to right . Hold _3-5___ seconds. Relax. Repeat _10___ times per set. Do __1-2__ sets per session. Do _as needed to correct lean to the side and pain___ sessions per day.   Piriformis (Supine)    Cross legs, right on top. Gently pull other knee toward chest until stretch is felt in buttock/hip of top leg. Hold __30-45__ seconds. Repeat __1__ times per set. Do _1___ sets per session. Do __1__ sessions per day.   Bending  Or hinge at the hips and bend knees    Bend at hips and knees, not back. Keep feet shoulder-width apart.   Copyright  VHI. All rights reserved.  Avoid Twisting    Avoid twisting or bending back. Pivot around using foot movements, and bend at knees if needed when reaching for articles. .  Overhead    Shift weight from front foot to back foot as item is lifted off shelf.   Carrying Luggage    Distribute weight evenly on both sides. Use a cart whenever possible. Do not twist trunk. Move body as a unit.   TENS UNIT: This is helpful for muscle pain and spasm.   Search and Purchase a TENS 7000 2nd edition at www.tenspros.com. It should be less than $30.     TENS unit instructions: Do not shower or bathe with the unit on Turn the unit off before removing electrodes or batteries If the electrodes lose stickiness add a drop of water to the electrodes after they are disconnected from the unit and place on plastic sheet. If you continued to have difficulty, call the TENS unit company to purchase more electrodes. Do not apply lotion on the skin area prior to use. Make sure the skin is clean and dry as this will help prolong the life of the electrodes. After use, always check skin for unusual red areas, rash or other skin difficulties. If there are any skin problems, does not apply electrodes to the same area. Never remove the  electrodes from the unit by pulling the wires. Do not use the TENS unit or electrodes other than as directed. Do not change electrode placement without consultating your therapist or physician. Keep 2 fingers with between each electrode. Wear time ratio is 2:1, on to off times.    For example on for 30 minutes off for 15 minutes and then on for 30 minutes off for 15 minutes

## 2016-12-31 ENCOUNTER — Ambulatory Visit (INDEPENDENT_AMBULATORY_CARE_PROVIDER_SITE_OTHER): Payer: 59 | Admitting: Physical Therapy

## 2016-12-31 ENCOUNTER — Encounter: Payer: Self-pay | Admitting: Physical Therapy

## 2016-12-31 DIAGNOSIS — M5442 Lumbago with sciatica, left side: Secondary | ICD-10-CM | POA: Diagnosis not present

## 2016-12-31 DIAGNOSIS — M6281 Muscle weakness (generalized): Secondary | ICD-10-CM

## 2016-12-31 DIAGNOSIS — M62830 Muscle spasm of back: Secondary | ICD-10-CM

## 2016-12-31 DIAGNOSIS — R293 Abnormal posture: Secondary | ICD-10-CM | POA: Diagnosis not present

## 2016-12-31 NOTE — Patient Instructions (Addendum)
On Elbows (Prone) - or do as needed for pain into the leg.     Rise up on elbows as high as possible, keeping hips on floor. Hold __30-60__ seconds. Repeat __1__ times per set. Do __1__ sets per session. Do _3___ sessions per day.  Back Hyperextension: Using Arms    Lying face down with arms bent, inhale. Then while exhaling, straighten arms. Hold __1__ seconds. Slowly return to starting position. Repeat _5-10___ times per set. Do __1__ sets per session. Do _3___ sessions per day.  Backward Bend (Standing)    Arch backward to make hollow of back deeper. Hold __2-3__ seconds. Repeat _5-10___ times per set. Do ____ sets per session. Do _as needed through out the day as needed. ___ sessions per day.   Log Roll    Lying on back, bend left knee and place left arm across chest. Roll all in one movement to the right. Reverse to roll to the left. Always move as one unit.  Getting Into / Out of Bed    Lower self to lie down on one side by raising legs and lowering head at the same time. Use arms to assist moving without twisting. Bend both knees to roll onto back if desired. To sit up, start from lying on side, and use same move-ments in reverse. Keep trunk aligned with legs.   Copyright  VHI. All rights reserved.

## 2016-12-31 NOTE — Therapy (Signed)
Kathleen [name] [place], [place], [address] Phone: [phone]   Fax:  [phone]  Physical Therapy Treatment  Patient Details  Name: Kathleen [name] MRN: [id number] Date of Birth: 06-07-1941 Referring Provider: Dr Darene Lamer  Encounter Date: 12/31/2016      PT End of Session - 12/31/16 0849    Visit Number 4   Number of Visits 8   Date for PT Re-Evaluation 01/07/17   PT Start Time 0849   PT Stop Time 0936   PT Time Calculation (min) 47 min   Activity Tolerance Patient limited by pain      Past Medical History:  Diagnosis Date  . Cancer ([place])    Hx RT breast infiltrated ductal CA previously on tamoxifen and armidex   . Herpes simplex    lt eye  . Hypertension   . Kidney stones    history  . MVP (mitral valve prolapse)    asymptomatic    Past Surgical History:  Procedure Laterality Date  . ABDOMINAL HYSTERECTOMY     partial  . BREAST LUMPECTOMY     RT  . CERVICAL LAMINECTOMY  1986  . SHOULDER SURGERY     rotator cuff repair  . throidectomy  1988   For cancer     There were no vitals filed for this visit.      Subjective Assessment - 12/31/16 0849    Subjective Pt reports the pain started to go down her Lt LE to her foot. She had some back spasms after the last visit with occassional sharp pain,  Trying to watch her body mechanics. Did work her long shifts Monday and Tuesday.    Patient Stated Goals straighten up her body so she doesn't lean to the side, keep working as she doesn't want to retire. Walk dogs again,    Currently in Pain? Yes   Pain Score 8    Pain Location Back   Pain Orientation Left   Pain Descriptors / Indicators Burning   Pain Type Acute pain   Pain Radiating Towards down into her Lt foot    Pain Onset More than a month ago   Pain Frequency Constant   Aggravating Factors  walking   Pain Relieving Factors sitting and lying down            [facility] PT Assessment - 12/31/16  0001      Assessment   Medical Diagnosis not scheduled.    Referring Provider Dr T   Onset Date/Surgical Date 10/10/16   Next MD Visit 6/18                     [facility] Adult PT Treatment/Exercise - 12/31/16 0001      Lumbar Exercises: Standing   Other Standing Lumbar Exercises lateral shifts, hips to Rt, ribs to Lt this relleived some pain   Other Standing Lumbar Exercises standing lumbar exxtension     Lumbar Exercises: Supine   Bridge --  3x10 with intermittent BKTC to stretch out low back.    Other Supine Lumbar Exercises 10 reps head presses to decreased cervical pain after POE     Lumbar Exercises: Prone   Other Prone Lumbar Exercises POE  then 10 reps press ups     Modalities   Modalities Traction     Traction   Type of Traction Lumbar  wt~120#   Min (lbs) 15   Max (lbs) 35   Hold Time 99sec  Rest Time 15 sec   Time 13 min                PT Education - 12/31/16 0924    Education provided Yes   Education Details extension program and traction, disc symptoms   Person(s) Educated Patient   Methods Explanation;Demonstration;Handout   Comprehension Returned demonstration;Verbalized understanding             PT Long Term Goals - 12/24/16 1610      PT LONG TERM GOAL #1   Title I with advanced HEP ( 01/07/17)    Status On-going     PT LONG TERM GOAL #2   Title improve FOTO =/< 33% limited ( 01/07/17)    Status On-going     PT LONG TERM GOAL #3   Title have decreased muscular tightness to allow her to walk and stand with upright posture ( 01/07/17)    Status On-going     PT LONG TERM GOAL #4   Title demo bilat hip abduction =/> 5-/5 ( 01/07/17)    Status On-going     PT LONG TERM GOAL #5   Title report no more than 1/10 pain after her long work shift on Mondays ( 01/07/17)    Status On-going               Plan - 12/31/16 9604    Clinical Impression Statement Pt with change in symptoms, burning pain down into her Lt foot.   Responded well to lumbar extension, taking pain out of LE.  Trial traction - lumbar to see if this will help and strongly encouraged Kathline to use her anit-inflammatories. No new goals met,  If she doesn't respond to extension and traction will refer back to MD    Rehab Potential Excellent   PT Frequency 2x / week   PT Duration 4 weeks   PT Treatment/Interventions Moist Heat;Ultrasound;Therapeutic exercise;Dry needling;Taping;Manual techniques;Cryotherapy;Electrical Stimulation;Patient/family education   PT Next Visit Plan assess response to lumbar traction.    Consulted and Agree with Plan of Care Patient      Patient will benefit from skilled therapeutic intervention in order to improve the following deficits and impairments:  Pain, Postural dysfunction, Decreased strength, Increased muscle spasms  Visit Diagnosis: Acute bilateral low back pain with left-sided sciatica  Muscle weakness (generalized)  Muscle spasm of back  Abnormal posture     Problem List Patient Active Problem List   Diagnosis Date Noted  . Lumbar spondylosis 12/03/2016  . Corn of foot 11/26/2016  . History of breast cancer 11/09/2012  . Benign hematuria 07/05/2010  . POSTMENOPAUSAL STATUS 03/29/2010  . IMPAIRED FASTING GLUCOSE 12/11/2008  . LEG PAIN, BILATERAL 01/20/2008  . HIP PAIN, RIGHT 01/05/2008  . Disorder of bone and cartilage 02/26/2007  . Hypothyroidism 04/28/2006  . White coat hypertension 04/28/2006  . MITRAL VALVE DISORDER 04/28/2006    Kathleen [name] PT  12/31/2016, 9:27 AM  [facility] [place], [place], [address] Phone: [phone]   Fax:  [phone]  Name: ARMANDA [name] MRN: [id number] Date of Birth: 08-Mar-1941

## 2017-01-06 ENCOUNTER — Encounter: Payer: Self-pay | Admitting: Physical Therapy

## 2017-01-06 ENCOUNTER — Ambulatory Visit (INDEPENDENT_AMBULATORY_CARE_PROVIDER_SITE_OTHER): Payer: 59 | Admitting: Physical Therapy

## 2017-01-06 DIAGNOSIS — M62830 Muscle spasm of back: Secondary | ICD-10-CM | POA: Diagnosis not present

## 2017-01-06 DIAGNOSIS — R293 Abnormal posture: Secondary | ICD-10-CM | POA: Diagnosis not present

## 2017-01-06 DIAGNOSIS — M5442 Lumbago with sciatica, left side: Secondary | ICD-10-CM | POA: Diagnosis not present

## 2017-01-06 DIAGNOSIS — M6281 Muscle weakness (generalized): Secondary | ICD-10-CM | POA: Diagnosis not present

## 2017-01-06 NOTE — Patient Instructions (Addendum)
Outer Hip Stretch: Reclined IT Band Stretch (Strap)    Strap around opposite foot, pull across only as far as possible with shoulders on mat. Hold for _30-60___ secs. Repeat _3___ times each leg. Once a day  Copyright  VHI. All rights reserved.

## 2017-01-06 NOTE — Therapy (Addendum)
[place], [place], [address] Phone: [phone]   Fax:  [phone]  Physical Therapy Treatment  Patient Details  Name: Kathleen [name] MRN: [id number] Date of Birth: 1940/10/03 Referring Provider: Dr Darene Lamer  Encounter Date: 01/06/2017      PT End of Session - 01/06/17 0929    Visit Number 5   Number of Visits 8   Date for PT Re-Evaluation 01/07/17   PT Start Time 0929   PT Stop Time 1020   PT Time Calculation (min) 51 min   Activity Tolerance Patient tolerated treatment well      Past Medical History:  Diagnosis Date  . Cancer ([place])    Hx RT breast infiltrated ductal CA previously on tamoxifen and armidex   . Herpes simplex    lt eye  . Hypertension   . Kidney stones    history  . MVP (mitral valve prolapse)    asymptomatic    Past Surgical History:  Procedure Laterality Date  . ABDOMINAL HYSTERECTOMY     partial  . BREAST LUMPECTOMY     RT  . CERVICAL LAMINECTOMY  1986  . SHOULDER SURGERY     rotator cuff repair  . throidectomy  1988   For cancer     There were no vitals filed for this visit.      Subjective Assessment - 01/06/17 0930    Subjective Pt reports she didn't work yesterday, using her medication and is under a lot of stress.    Patient Stated Goals straighten up her body so she doesn't lean to the side, keep working as she doesn't want to retire. Walk dogs again,    Currently in Pain? Yes   Pain Score 5    Pain Location Back   Pain Orientation Left   Pain Descriptors / Indicators Aching   Pain Radiating Towards into Lt foot 4/10 and sometimes into her groin.    Pain Onset More than a month ago   Pain Frequency Intermittent   Aggravating Factors  over doing it   Pain Relieving Factors sitting             [facility] PT Assessment - 01/06/17 0001      Assessment   Medical Diagnosis lumbar spndylosis   Referring Provider Dr T   Onset Date/Surgical Date  10/10/16   Next MD Visit not scheduled     Posture/Postural Control   Posture Comments Pt able to stand upright today with good alignement      Strength   Right Hip ABduction --  5-/5   Left Hip ABduction --  5-/5 with pain                     [facility] Adult PT Treatment/Exercise - 01/06/17 0001      Lumbar Exercises: Stretches   Quad Stretch 3 reps;30 seconds  with strap bilat   ITB Stretch Limitations cross body with stap, Lt LE limited compared to Rt      Lumbar Exercises: Prone   Opposite Arm/Leg Raise Left arm/Right leg;Right arm/Left leg;10 reps  very difficulty for patient   Other Prone Lumbar Exercises bilat LE lift x 10      Traction   Type of Traction Lumbar  prone   Min (lbs) 30   Max (lbs) 45   Hold Time 99sec   Rest Time 15 sec   Time 20 min     Manual Therapy  Manual Therapy Joint mobilization;Myofascial release   Joint Mobilization lumbar CPA and Lt UPA mobs grade III,    Myofascial Release around sacrum Lt/Rt                 PT Education - 01/06/17 0954    Education provided Yes   Education Details ITB stretches   Person(s) Educated Patient   Methods Explanation;Handout   Comprehension Returned demonstration;Verbalized understanding             PT Long Term Goals - 01/06/17 0948      PT LONG TERM GOAL #1   Title I with advanced HEP ( 01/07/17)    Status On-going     PT LONG TERM GOAL #2   Title improve FOTO =/< 33% limited ( 01/07/17)    Status On-going     PT LONG TERM GOAL #3   Title have decreased muscular tightness to allow her to walk and stand with upright posture ( 01/07/17)    Status Partially Met  intermittently meets this goal     PT LONG TERM GOAL #4   Title demo bilat hip abduction =/> 5-/5 ( 01/07/17) Achieved      PT LONG TERM GOAL #5   Title report no more than 1/10 pain after her long work shift on Mondays ( 01/07/17)    Status On-going               Plan - 01/06/17 1002    Clinical  Impression Statement Pt presented today with upright posture, however she hasn't worked since last Thursday.  She is still having some pain into the Lt LE. some relief with lumbar traction. Partially met a goal. She is frustrated that she isn't seeing more lasting improvement.  Tolerated increased pull with lumbar traction and using her anti - inflammatories.    Rehab Potential Excellent   PT Frequency 2x / week   PT Duration 4 weeks   PT Treatment/Interventions Moist Heat;Ultrasound;Therapeutic exercise;Dry needling;Taping;Manual techniques;Cryotherapy;Electrical Stimulation;Patient/family education;Traction   PT Next Visit Plan reassess, FOTO and see how second traction was tolerated.    Consulted and Agree with Plan of Care Patient      Patient will benefit from skilled therapeutic intervention in order to improve the following deficits and impairments:  Pain, Postural dysfunction, Decreased strength, Increased muscle spasms  Visit Diagnosis: Acute bilateral low back pain with left-sided sciatica  Muscle weakness (generalized)  Muscle spasm of back  Abnormal posture     Problem List Patient Active Problem List   Diagnosis Date Noted  . Lumbar spondylosis 12/03/2016  . Corn of foot 11/26/2016  . History of breast cancer 11/09/2012  . Benign hematuria 07/05/2010  . POSTMENOPAUSAL STATUS 03/29/2010  . IMPAIRED FASTING GLUCOSE 12/11/2008  . LEG PAIN, BILATERAL 01/20/2008  . HIP PAIN, RIGHT 01/05/2008  . Disorder of bone and cartilage 02/26/2007  . Hypothyroidism 04/28/2006  . White coat hypertension 04/28/2006  . MITRAL VALVE DISORDER 04/28/2006    Jeral Pinch PT  01/06/2017, 10:06 AM  [facility] [place], [place], [address] Phone: [phone]   Fax:  [phone]  Name: Kathleen [name] MRN: [id number] Date of Birth: 05/03/1941   PHYSICAL THERAPY DISCHARGE SUMMARY  Visits from Start of Care:  5  Current functional level related to goals / functional outcomes: unknown   Remaining deficits: unknown   Education / Equipment: HEP Plan:  Patient goals were partially met. Patient is being discharged due to not returning since the last visit.  ?????Per MD notes she is going to have injections.     Jeral Pinch, PT 02/23/17 10:12 AM

## 2017-01-09 ENCOUNTER — Ambulatory Visit (INDEPENDENT_AMBULATORY_CARE_PROVIDER_SITE_OTHER): Payer: 59 | Admitting: Sports Medicine

## 2017-01-09 ENCOUNTER — Encounter: Payer: Self-pay | Admitting: Sports Medicine

## 2017-01-09 DIAGNOSIS — M47816 Spondylosis without myelopathy or radiculopathy, lumbar region: Secondary | ICD-10-CM

## 2017-01-09 NOTE — Progress Notes (Signed)
  Subjective:    CC: Follow-up  HPI: Kathleen [name] returns, she continues to have severe predominantly axial back pain, worse on the left with radiation to the left lateral thigh, she doesn't describe any further overt L5 radiculitis on the left side. Pain is worse with standing and walking, she does get significant gelling in the morning and significant pain with laying in bed at night. No bowel or bladder dysfunction, saddle numbness, constitutional symptoms. Her last cross-sectional imaging was in 2014 the results of which will be dictated below.  Past medical history:  Negative.  See flowsheet/record as well for more information.  Surgical history: Negative.  See flowsheet/record as well for more information.  Family history: Negative.  See flowsheet/record as well for more information.  Social history: Negative.  See flowsheet/record as well for more information.  Allergies, and medications have been entered into the medical record, reviewed, and no changes needed.   Review of Systems: No fevers, chills, night sweats, weight loss, chest pain, or shortness of breath.   Objective:    General: Well Developed, well nourished, and in no acute distress.  Neuro: Alert and oriented x3, extra-ocular muscles intact, sensation grossly intact.  HEENT: Normocephalic, atraumatic, pupils equal round reactive to light, neck supple, no masses, no lymphadenopathy, thyroid nonpalpable.  Skin: Warm and dry, no rashes. Cardiac: Regular rate and rhythm, no murmurs rubs or gallops, no lower extremity edema.  Respiratory: Clear to auscultation bilaterally. Not using accessory muscles, speaking in full sentences.  Impression and Recommendations:    Lumbar spondylosis Persistent pain, multilevel lower facet arthritis on the CT from 2014, pain is mostly facetogenic. She does have some spinal stenosis at L4-L5. We are going to proceed with an MRI for interventional planning, most likely the bottom 6 facet  joints. Return to go over MRI results.  I asked her to keep her answering machine empty so that we can leave voicemails.  She never took the prednisone taper or the gabapentin because she didn't check her voice mail.  I spent 25 minutes with this patient, greater than 50% was face-to-face time counseling regarding the above diagnoses

## 2017-01-09 NOTE — Assessment & Plan Note (Signed)
Persistent pain, multilevel lower facet arthritis on the CT from 2014, pain is mostly facetogenic. She does have some spinal stenosis at L4-L5. We are going to proceed with an MRI for interventional planning, most likely the bottom 6 facet joints. Return to go over MRI results.  I asked her to keep her answering machine empty so that we can leave voicemails.  She never took the prednisone taper or the gabapentin because she didn't check her voice mail.

## 2017-01-14 ENCOUNTER — Telehealth: Payer: Self-pay | Admitting: Sports Medicine

## 2017-01-14 ENCOUNTER — Encounter: Payer: 59 | Admitting: Physical Therapy

## 2017-01-14 NOTE — Telephone Encounter (Signed)
Pt needs pre meds prior to MRI. Routing for review.

## 2017-01-15 MED ORDER — DIAZEPAM 5 MG PO TABS: ORAL_TABLET | ORAL | 0 refills | Status: DC

## 2017-01-15 NOTE — Telephone Encounter (Signed)
Perception for Valium is in my box.

## 2017-01-16 NOTE — Telephone Encounter (Signed)
Pt notified -EH/RMA  

## 2017-01-19 ENCOUNTER — Ambulatory Visit (INDEPENDENT_AMBULATORY_CARE_PROVIDER_SITE_OTHER): Payer: 59

## 2017-01-19 DIAGNOSIS — M47816 Spondylosis without myelopathy or radiculopathy, lumbar region: Secondary | ICD-10-CM

## 2017-01-19 DIAGNOSIS — M48061 Spinal stenosis, lumbar region without neurogenic claudication: Secondary | ICD-10-CM | POA: Diagnosis not present

## 2017-01-23 ENCOUNTER — Ambulatory Visit (INDEPENDENT_AMBULATORY_CARE_PROVIDER_SITE_OTHER): Payer: 59 | Admitting: Sports Medicine

## 2017-01-23 ENCOUNTER — Ambulatory Visit: Payer: 59 | Admitting: Sports Medicine

## 2017-01-23 ENCOUNTER — Encounter: Payer: Self-pay | Admitting: Sports Medicine

## 2017-01-23 DIAGNOSIS — M47816 Spondylosis without myelopathy or radiculopathy, lumbar region: Secondary | ICD-10-CM | POA: Diagnosis not present

## 2017-01-23 NOTE — Progress Notes (Signed)
Order and demographics faxed to [facility] at [phone].

## 2017-01-23 NOTE — Progress Notes (Signed)
  Subjective:    CC: MRI results  HPI: This is a pleasant 76 year old female here for follow-up of her back pain, she's failed therapy, medications. Ultimately her back pain was axial, facetogenic and worse with standing and walking. We obtained an MRI the results of which will be dictated below.  Past medical history:  Negative.  See flowsheet/record as well for more information.  Surgical history: Negative.  See flowsheet/record as well for more information.  Family history: Negative.  See flowsheet/record as well for more information.  Social history: Negative.  See flowsheet/record as well for more information.  Allergies, and medications have been entered into the medical record, reviewed, and no changes needed.   Review of Systems: No fevers, chills, night sweats, weight loss, chest pain, or shortness of breath.   Objective:    General: Well Developed, well nourished, and in no acute distress.  Neuro: Alert and oriented x3, extra-ocular muscles intact, sensation grossly intact.  HEENT: Normocephalic, atraumatic, pupils equal round reactive to light, neck supple, no masses, no lymphadenopathy, thyroid nonpalpable.  Skin: Warm and dry, no rashes. Cardiac: Regular rate and rhythm, no murmurs rubs or gallops, no lower extremity edema.  Respiratory: Clear to auscultation bilaterally. Not using accessory muscles, speaking in full sentences.  Lumbar spine MRI shows multilevel degenerative disc disease and facet arthritis at multiple levels.  Impression and Recommendations:    Lumbar spondylosis Multilevel lower lumbar facet arthritis. Pain is axial and facetogenic. We are going to proceed with multilevel lower lumbar facet joint injections.  Return to see me one month afterwards, she has a Valium left over from her MRI, and she will take this an hour before her facet joint injections.  I spent 40 minutes with this patient, greater than 50% was face-to-face time counseling regarding  the above diagnoses

## 2017-01-23 NOTE — Assessment & Plan Note (Signed)
Multilevel lower lumbar facet arthritis. Pain is axial and facetogenic. We are going to proceed with multilevel lower lumbar facet joint injections.  Return to see me one month afterwards, she has a Valium left over from her MRI, and she will take this an hour before her facet joint injections.

## 2017-02-10 ENCOUNTER — Other Ambulatory Visit: Payer: Self-pay | Admitting: Sports Medicine

## 2017-02-10 ENCOUNTER — Ambulatory Visit
Admission: RE | Admit: 2017-02-10 | Discharge: 2017-02-10 | Disposition: A | Discharge status: Home / Self Care | Payer: 59 | Source: Ambulatory Visit | Attending: Sports Medicine | Admitting: Sports Medicine

## 2017-02-10 DIAGNOSIS — M47816 Spondylosis without myelopathy or radiculopathy, lumbar region: Secondary | ICD-10-CM

## 2017-02-10 DIAGNOSIS — M545 Low back pain: Secondary | ICD-10-CM | POA: Diagnosis not present

## 2017-02-10 MED ORDER — IOPAMIDOL (ISOVUE-M 200) INJECTION 41%
1.0000 mL | Freq: Once | INTRAMUSCULAR | Status: AC
  Administered 2017-02-10: 1 mL via INTRA_ARTICULAR

## 2017-02-10 MED ORDER — METHYLPREDNISOLONE ACETATE 40 MG/ML INJ SUSP (RADIOLOG
120.0000 mg | Freq: Once | INTRAMUSCULAR | Status: AC
  Administered 2017-02-10: 120 mg via INTRA_ARTICULAR

## 2017-02-10 NOTE — Discharge Instructions (Signed)

## 2017-02-18 ENCOUNTER — Telehealth: Payer: Self-pay

## 2017-02-18 ENCOUNTER — Telehealth: Payer: Self-pay | Admitting: *Deleted

## 2017-02-18 NOTE — Telephone Encounter (Signed)
Patient called to ask if it is normal to feel like "you have the flu" after having "six facet injections last Tuesday," February 10, 2017.  I told her it was not and suggested she call her primary care doctor.  She says she felt fine that Tuesday, Wednesday, Thursday and Friday, but by the time she left work on Saturday she had chills and felt awful.  Brita Romp, RN

## 2017-02-18 NOTE — Telephone Encounter (Signed)
Patient called and states last Tuesday she received injection in her back and then Sat coming home she felt very bad. She states she had chills and felt achy. She states she has been in the bed ever since. She describes what sound like flu-like symptoms . I did ask her if she had checked her temperature and she had not but she states she felt like she had fever. I did tell her that it would be best to schedule an appointment to be seen because we cannot determine what is going on over the phone. I said it probably had nothing to do with the injections she received and may be a coincidental virus but again an appointment is needed. The patient states she wanted to speak with Dr. Madilyn Fireman. I did tell her she was out of the office this week. The patient states she cannot come in because she cannot drive here right now and she has no one to take her. She states she will tough it out until Dr. Madilyn Fireman gets back. Offered appointment once more and patient declined.

## 2017-02-19 ENCOUNTER — Telehealth: Payer: Self-pay | Admitting: Family Medicine

## 2017-02-19 NOTE — Telephone Encounter (Signed)
Pt called requesting to see Dr. Madilyn Fireman on Monday adv pt Dr. Madilyn Fireman does not have any openings but willing to let her speak to a nurse regarding her concerns. She was not trying to hear what I was advising and said she doesn't want to see another Dr. but Madilyn Fireman I told pt I understood but at this point since her schedule is full I could let her speak to the triage nurse about her concerns. She hung up the phone. F.Y.I

## 2017-02-23 ENCOUNTER — Ambulatory Visit (INDEPENDENT_AMBULATORY_CARE_PROVIDER_SITE_OTHER): Payer: 59 | Admitting: Family Medicine

## 2017-02-23 VITALS — BP 163/72 | HR 69 | Temp 97.4°F | Wt 116.0 lb

## 2017-02-23 DIAGNOSIS — M6281 Muscle weakness (generalized): Secondary | ICD-10-CM

## 2017-02-23 DIAGNOSIS — R030 Elevated blood-pressure reading, without diagnosis of hypertension: Secondary | ICD-10-CM

## 2017-02-23 DIAGNOSIS — R5383 Other fatigue: Secondary | ICD-10-CM | POA: Diagnosis not present

## 2017-02-23 DIAGNOSIS — R6883 Chills (without fever): Secondary | ICD-10-CM | POA: Diagnosis not present

## 2017-02-23 LAB — CBC WITH DIFFERENTIAL/PLATELET
BASOS ABS: 0 {cells}/uL (ref 0–200)
BASOS PCT: 0 %
EOS ABS: 84 {cells}/uL (ref 15–500)
EOS PCT: 1 %
HCT: 39.1 % (ref 35.0–45.0)
HEMOGLOBIN: 13 g/dL (ref 11.7–15.5)
LYMPHS ABS: 2016 {cells}/uL (ref 850–3900)
Lymphocytes Relative: 24 %
MCH: 30.2 pg (ref 27.0–33.0)
MCHC: 33.2 g/dL (ref 32.0–36.0)
MCV: 90.9 fL (ref 80.0–100.0)
MONOS PCT: 9 %
MPV: 10 fL (ref 7.5–12.5)
Monocytes Absolute: 756 cells/uL (ref 200–950)
NEUTROS ABS: 5544 {cells}/uL (ref 1500–7800)
Neutrophils Relative %: 66 %
PLATELETS: 288 10*3/uL (ref 140–400)
RBC: 4.3 MIL/uL (ref 3.80–5.10)
RDW: 13.1 % (ref 11.0–15.0)
WBC: 8.4 10*3/uL (ref 3.8–10.8)

## 2017-02-23 LAB — COMPLETE METABOLIC PANEL WITH GFR
ALBUMIN: 4 g/dL (ref 3.6–5.1)
ALK PHOS: 58 U/L (ref 33–130)
ALT: 9 U/L (ref 6–29)
AST: 13 U/L (ref 10–35)
BILIRUBIN TOTAL: 0.7 mg/dL (ref 0.2–1.2)
BUN: 22 mg/dL (ref 7–25)
CO2: 25 mmol/L (ref 20–32)
CREATININE: 0.73 mg/dL (ref 0.60–0.93)
Calcium: 9.3 mg/dL (ref 8.6–10.4)
Chloride: 105 mmol/L (ref 98–110)
GFR, Est African American: >89 mL/min (ref >=60)
GFR, Est Non African American: 80 mL/min (ref >=60)
GLUCOSE: 97 mg/dL (ref 65–99)
Potassium: 3.9 mmol/L (ref 3.5–5.3)
SODIUM: 141 mmol/L (ref 135–146)
TOTAL PROTEIN: 6.4 g/dL (ref 6.1–8.1)

## 2017-02-23 LAB — TSH: TSH: 3.06 m[IU]/L

## 2017-02-23 NOTE — Progress Notes (Signed)
Subjective:    Patient ID: Kathleen [name], female    DOB: Jun 18, 1941, 76 y.o.   MRN: [id number]  HPI 76 year old female comes in today complaining of one week of diffuse body aches and muscle weakness. She did not have any upper respiratory symptoms or fever that she was able to measure home but she did have chills and sweats. She also expressed some headaches. No cough or nasal congestion. She does have occasional abdominal discomfort when she would try to eat. But she had significant decrease in appetite. No vomiting or nausea or diarrhea. She did start working more Gatorade yesterday and has sought a little bit better today though she still didn't trust herself to drive and had her sister bring her.  She was unable to take her blood pressure pill for a couple of days because she just felt so weak.   Review of Systems  BP (!) 163/72   Pulse 69   Temp (!) 97.4 F (36.3 C) (Oral)   Wt 116 lb (52.6 kg)   BMI 21.22 kg/m     Allergies  Allergen Reactions  . Streptomycin Other (See Comments)    Passed out  . Codeine   . Penicillins   . Sulfonamide Derivatives   . Benicar [Olmesartan] Other (See Comments)    Diizzy  . Hctz [Hydrochlorothiazide] Other (See Comments)    Vertigo, dizziness.   . Lisinopril Other (See Comments)    lightheaded    Past Medical History:  Diagnosis Date  . Cancer ([place])    Hx RT breast infiltrated ductal CA previously on tamoxifen and armidex   . Herpes simplex    lt eye  . Hypertension   . Kidney stones    history  . MVP (mitral valve prolapse)    asymptomatic    Past Surgical History:  Procedure Laterality Date  . ABDOMINAL HYSTERECTOMY     partial  . BREAST LUMPECTOMY     RT  . CERVICAL LAMINECTOMY  1986  . SHOULDER SURGERY     rotator cuff repair  . throidectomy  1988   For cancer     Social History   Social History  . Marital status: Married    Spouse name: N/A  . Number of children: N/A  . Years of education: N/A    Occupational History  . Not on file.   Social History Main Topics  . Smoking status: Never Smoker  . Smokeless tobacco: Never Used  . Alcohol use No  . Drug use: Unknown  . 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   Other Topics Concern  . Not on file   Social History Narrative  . No narrative on file    Family History  Problem Relation Age of Onset  . Breast cancer Mother     Outpatient Encounter Prescriptions as of 02/23/2017  Medication Sig  . ibuprofen (ADVIL,MOTRIN) 800 MG tablet TAKE ONE TABLET EVERY 8 HOURS AS NEEDED  . SYNTHROID 75 MCG tablet TAKE ONE (1) TABLET BY MOUTH EVERY DAY BEFORE BREAKFAST  . ValACYclovir HCl (VALTREX PO) Take by mouth.  . [DISCONTINUED] diazepam (VALIUM) 5 MG tablet Take 1 tab PO 1 hour before procedure or imaging.  . [DISCONTINUED] gabapentin (NEURONTIN) 100 MG capsule Take 1 capsule (100 mg total) by mouth 2 (two) times daily.   No facility-administered encounter medications on file  as of 02/23/2017.          Objective:   Physical Exam  Constitutional: She is oriented to person, place, and time. She appears well-developed and well-nourished.  HENT:  Head: Normocephalic and atraumatic.  Right Ear: External ear normal.  Left Ear: External ear normal.  Nose: Nose normal.  Mouth/Throat: Oropharynx is clear and moist.  TMs and canals are clear.   Eyes: Pupils are equal, round, and reactive to light. Conjunctivae and EOM are normal.  Neck: Neck supple. No thyromegaly present.  Cardiovascular: Normal rate, regular rhythm and normal heart sounds.   Pulmonary/Chest: Effort normal and breath sounds normal. She has no wheezes.  Abdominal: Soft. Bowel sounds are normal. She exhibits no distension and no mass. There is no tenderness. There is no rebound and no guarding.  Lymphadenopathy:    She has no cervical adenopathy.   Neurological: She is alert and oriented to person, place, and time.  Skin: Skin is warm and dry.  Psychiatric: She has a normal mood and affect.          Assessment & Plan:  Myalgias with chills and sweats-suspect that she had a viral type illness. It seems like she is actually getting a little bit better on her own. We'll go ahead and check some extra blood work today just to rule out acute infection as well as inflammatory markers to rule out any type of autoimmune disorder. Doesn't quite seem consistent with other conditions like polymyalgia rheumatica. Did encourage her to really push her hydration and to eat whatever she thinks she could possibly eat that sounds good.  Shannon hypertension-follow-up in 2 weeks to recheck blood pressure with nurse visit.

## 2017-02-24 LAB — SEDIMENTATION RATE: Sed Rate: 9 mm/hr (ref 0–30)

## 2017-02-24 LAB — C-REACTIVE PROTEIN: CRP: 4.5 mg/L (ref <8.0)

## 2017-02-25 ENCOUNTER — Encounter: Payer: Self-pay | Admitting: Sports Medicine

## 2017-02-25 ENCOUNTER — Ambulatory Visit (INDEPENDENT_AMBULATORY_CARE_PROVIDER_SITE_OTHER): Payer: 59 | Admitting: Sports Medicine

## 2017-02-25 DIAGNOSIS — M1712 Unilateral primary osteoarthritis, left knee: Secondary | ICD-10-CM | POA: Diagnosis not present

## 2017-02-25 DIAGNOSIS — M47816 Spondylosis without myelopathy or radiculopathy, lumbar region: Secondary | ICD-10-CM

## 2017-02-25 DIAGNOSIS — M170 Bilateral primary osteoarthritis of knee: Secondary | ICD-10-CM | POA: Insufficient documentation

## 2017-02-25 NOTE — Assessment & Plan Note (Signed)
Did extremely well with lumbar facet joint injections.  She is for the most part pain-free now, and happy with the results. She did have bilateral L3-S1 facet joint injections. We can repeat these every 3-4 months, and if relief doesn't last as long she would then be a candidate for multilevel facet radiofrequency ablation which can provide 9-12 months of relief.

## 2017-02-25 NOTE — Assessment & Plan Note (Signed)
Medial joint line pain, mild effusion. Patient is not in too much pain today, but if this recurs I told her she can return for a knee injection. Going to hold off on x-rays for now.

## 2017-02-25 NOTE — Progress Notes (Signed)
   Subjective:    I'm seeing this patient as a consultation for:  Dr. Beatrice Lecher  CC: Left knee pain and follow-up from facet joint injections  HPI:  Lumbar spondylosis: Severe, debilitating back pain previously, ultimately MRI showed some lumbar spinal stenosis with fairly marked facet joint arthritis. We proceeded with bilateral L3-S1 facet joint injections, she was completely pain-free after the injections, had a bit of a viral infection, which resolved and she returns today nearly pain-free. Happy with results.  Left knee pain: Tender at the medial joint line, for years, moderate, persistent, no mechanical symptoms. Mild swelling. Not hurting enough to consider medication or intervention today but she will return if and when she desires intervention.  Past medical history, Surgical history, Family history not pertinant except as noted below, Social history, Allergies, and medications have been entered into the medical record, reviewed, and no changes needed.   Review of Systems: No headache, visual changes, nausea, vomiting, diarrhea, constipation, dizziness, abdominal pain, skin rash, fevers, chills, night sweats, weight loss, swollen lymph nodes, body aches, joint swelling, muscle aches, chest pain, shortness of breath, mood changes, visual or auditory hallucinations.   Objective:   General: Well Developed, well nourished, and in no acute distress.  Neuro:  Extra-ocular muscles intact, able to move all 4 extremities, sensation grossly intact.  Deep tendon reflexes tested were normal. Psych: Alert and oriented, mood congruent with affect. ENT:  Ears and nose appear unremarkable.  Hearing grossly normal. Neck: Unremarkable overall appearance, trachea midline.  No visible thyroid enlargement. Eyes: Conjunctivae and lids appear unremarkable.  Pupils equal and round. Skin: Warm and dry, no rashes noted.  Cardiovascular: Pulses palpable, no extremity edema. Left Knee: Mild effusion,  tender at the medial joint line. ROM normal in flexion and extension and lower leg rotation. Ligaments with solid consistent endpoints including ACL, PCL, LCL, MCL. Negative Mcmurray's and provocative meniscal tests. Non painful patellar compression. Patellar and quadriceps tendons unremarkable. Hamstring and quadriceps strength is normal.  Impression and Recommendations:   This case required medical decision making of moderate complexity.  Lumbar spondylosis Did extremely well with lumbar facet joint injections.  She is for the most part pain-free now, and happy with the results. She did have bilateral L3-S1 facet joint injections. We can repeat these every 3-4 months, and if relief doesn't last as long she would then be a candidate for multilevel facet radiofrequency ablation which can provide 9-12 months of relief.  Primary osteoarthritis of left knee Medial joint line pain, mild effusion. Patient is not in too much pain today, but if this recurs I told her she can return for a knee injection. Going to hold off on x-rays for now.

## 2017-03-11 ENCOUNTER — Ambulatory Visit: Payer: 59

## 2017-03-18 DIAGNOSIS — B070 Plantar wart: Secondary | ICD-10-CM | POA: Diagnosis not present

## 2017-04-07 ENCOUNTER — Other Ambulatory Visit: Payer: Self-pay | Admitting: Family Medicine

## 2017-04-15 ENCOUNTER — Encounter: Payer: Self-pay | Admitting: Sports Medicine

## 2017-04-15 ENCOUNTER — Ambulatory Visit (INDEPENDENT_AMBULATORY_CARE_PROVIDER_SITE_OTHER): Payer: 59 | Admitting: Sports Medicine

## 2017-04-15 DIAGNOSIS — M170 Bilateral primary osteoarthritis of knee: Secondary | ICD-10-CM

## 2017-04-15 MED ORDER — MELOXICAM 15 MG PO TABS: ORAL_TABLET | ORAL | 3 refills | Status: DC

## 2017-04-15 NOTE — Progress Notes (Signed)
  Subjective:    CC: follow-up  HPI: Bilateral knee osteoarthritis: Desires injection into the left knee, pain is moderate, persistent, localized with radiation into the thigh and shin, no mechanical symptoms, no trauma. Right knee also has similar pain, ibuprofen does not last long enough.  Past medical history:  Negative.  See flowsheet/record as well for more information.  Surgical history: Negative.  See flowsheet/record as well for more information.  Family history: Negative.  See flowsheet/record as well for more information.  Social history: Negative.  See flowsheet/record as well for more information.  Allergies, and medications have been entered into the medical record, reviewed, and no changes needed.   Review of Systems: No fevers, chills, night sweats, weight loss, chest pain, or shortness of breath.   Objective:    General: Well Developed, well nourished, and in no acute distress.  Neuro: Alert and oriented x3, extra-ocular muscles intact, sensation grossly intact.  HEENT: Normocephalic, atraumatic, pupils equal round reactive to light, neck supple, no masses, no lymphadenopathy, thyroid nonpalpable.  Skin: Warm and dry, no rashes. Cardiac: Regular rate and rhythm, no murmurs rubs or gallops, no lower extremity edema.  Respiratory: Clear to auscultation bilaterally. Not using accessory muscles, speaking in full sentences. Bilateral knees: Normal to inspection with no erythema or effusion or obvious bony abnormalities. Tender to palpation at the medial joint lines bilaterally ROM normal in flexion and extension and lower leg rotation. Ligaments with solid consistent endpoints including ACL, PCL, LCL, MCL. Negative Mcmurray's and provocative meniscal tests. Non painful patellar compression. Patellar and quadriceps tendons unremarkable. Hamstring and quadriceps strength is normal.  Procedure: Real-time Ultrasound Guided Injection of left knee Device: GE Logiq E  Verbal  informed consent obtained.  Time-out conducted.  Noted no overlying erythema, induration, or other signs of local infection.  Skin prepped in a sterile fashion.  Local anesthesia: Topical Ethyl chloride.  With sterile technique and under real time ultrasound guidance:  1 mL Kenalog 40, 2 mL lidocaine, 2 mL bupivacaine injected easily Completed without difficulty  Pain immediately resolved suggesting accurate placement of the medication.  Advised to call if fevers/chills, erythema, induration, drainage, or persistent bleeding.  Images permanently stored and available for review in the ultrasound unit.  Impression: Technically successful ultrasound guided injection.  Impression and Recommendations:    Primary osteoarthritis of both knees Left knee injection.  Switching to meloxicam, return in a month, right knee injection if no better.  ___________________________________________ Gwen Her. Dianah Field, M.D., ABFM., CAQSM. Primary Care and [place] In[employer] of [place] [employer] [facility]

## 2017-04-15 NOTE — Assessment & Plan Note (Signed)
Left knee injection.  Switching to meloxicam, return in a month, right knee injection if no better.

## 2017-04-22 ENCOUNTER — Encounter: Payer: 59 | Admitting: Family Medicine

## 2017-04-22 ENCOUNTER — Ambulatory Visit (INDEPENDENT_AMBULATORY_CARE_PROVIDER_SITE_OTHER): Payer: 59 | Admitting: Family Medicine

## 2017-04-22 ENCOUNTER — Encounter: Payer: Self-pay | Admitting: Family Medicine

## 2017-04-22 VITALS — BP 142/74 | HR 70 | Ht 62.0 in | Wt 118.0 lb

## 2017-04-22 DIAGNOSIS — E039 Hypothyroidism, unspecified: Secondary | ICD-10-CM | POA: Diagnosis not present

## 2017-04-22 DIAGNOSIS — N644 Mastodynia: Secondary | ICD-10-CM | POA: Diagnosis not present

## 2017-04-22 DIAGNOSIS — Z23 Encounter for immunization: Secondary | ICD-10-CM

## 2017-04-22 DIAGNOSIS — Z0000 Encounter for general adult medical examination without abnormal findings: Secondary | ICD-10-CM | POA: Diagnosis not present

## 2017-04-22 DIAGNOSIS — R7301 Impaired fasting glucose: Secondary | ICD-10-CM | POA: Diagnosis not present

## 2017-04-22 LAB — TSH: TSH: 0.68 mIU/L (ref 0.40–4.50)

## 2017-04-22 LAB — LIPID PANEL W/REFLEX DIRECT LDL
CHOL/HDL RATIO: 3.3 (calc) (ref <5.0)
CHOLESTEROL: 200 mg/dL — AB (ref <200)
HDL: 61 mg/dL (ref >50)
LDL Cholesterol (Calc): 123 mg/dL (calc) — ABNORMAL HIGH
NON-HDL CHOLESTEROL (CALC): 139 mg/dL — AB (ref <130)
Triglycerides: 70 mg/dL (ref <150)

## 2017-04-22 MED ORDER — VALACYCLOVIR HCL 500 MG PO TABS: 500.0000 mg | ORAL_TABLET | Freq: Every day | ORAL | 3 refills | Status: DC

## 2017-04-22 NOTE — Progress Notes (Signed)
Subjective:   Kathleen [name] is a 76 y.o. female who presents for Medicare Annual (Subsequent) preventive examination.  She has been having some pain and discomfort in the right breast. She does do a lot fo lifting at work and is not sure if muscle related. She has prior hx of breast cancer and is worried.    Review of Systems:  Comprehensive review of systems is negative except for history of present illness.       Objective:     Vitals: BP (!) 142/74   Pulse 70   Ht 5\' 2"  (1.575 m)   Wt 118 lb (53.5 kg)   SpO2 100%   BMI 21.58 kg/m   Body mass index is 21.58 kg/m.  Physical Exam  Constitutional: She is oriented to person, place, and time. She appears well-developed and well-nourished.  HENT:  Head: Normocephalic and atraumatic.  Right Ear: External ear normal.  Left Ear: External ear normal.  Nose: Nose normal.  Mouth/Throat: Oropharynx is clear and moist.  TMs and canals are clear.   Eyes: Pupils are equal, round, and reactive to light. Conjunctivae and EOM are normal.  Neck: Neck supple. No thyromegaly present.  Cardiovascular: Normal rate, regular rhythm and normal heart sounds.   Pulmonary/Chest: Effort normal and breath sounds normal. She has no wheezes.  Abdominal: Soft. Bowel sounds are normal.  Musculoskeletal: She exhibits no edema.  Lymphadenopathy:    She has no cervical adenopathy.  Neurological: She is alert and oriented to person, place, and time.  Skin: Skin is warm and dry.  Psychiatric: She has a normal mood and affect.    Tobacco History  Smoking Status  . Never Smoker  Smokeless Tobacco  . Never Used     Counseling given: Not Answered   Past Medical History:  Diagnosis Date  . Cancer ([place])    Hx RT breast infiltrated ductal CA previously on tamoxifen and armidex   . Herpes simplex    lt eye  . Hypertension   . Kidney stones    history  . MVP (mitral valve prolapse)    asymptomatic   Past Surgical History:  Procedure  Laterality Date  . ABDOMINAL HYSTERECTOMY     partial  . BREAST LUMPECTOMY     RT  . CERVICAL LAMINECTOMY  1986  . SHOULDER SURGERY     rotator cuff repair  . throidectomy  1988   For cancer    Family History  Problem Relation Age of Onset  . Breast cancer Mother    History  Sexual Activity  . 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    Outpatient Encounter Prescriptions as of 04/22/2017  Medication Sig  . SYNTHROID 75 MCG tablet TAKE ONE (1) TABLET EACH DAY BEFORE BREAKFAST  . valACYclovir (VALTREX) 500 MG tablet Take 1 tablet (500 mg total) by mouth daily.  . [DISCONTINUED] ValACYclovir HCl (VALTREX PO) Take 500 mg by mouth daily.  . [DISCONTINUED] meloxicam (MOBIC) 15 MG tablet One tab PO qAM with breakfast for 2 weeks, then daily prn pain.   No facility-administered encounter medications on file as of 04/22/2017.     Activities of Daily Living In your present state of health, do you have any difficulty performing the following activities: 04/22/2017  Hearing? N  Vision? N  Difficulty concentrating or making decisions? N  Walking or climbing stairs?  N  Dressing or bathing? N  Doing errands, shopping? N  Some recent data might be hidden    Patient Care Team: Hali Marry, MD as PCP - General    Assessment:     Exercise Activities and Dietary recommendations Current Exercise Habits: Home exercise routine, Type of exercise: walking  Goals    None     Fall Risk Fall Risk  04/22/2017 11/26/2016 04/16/2016 04/25/2014 04/15/2013  Falls in the past year? No No No No No  Risk for fall due to : - - - (No Data) -  Risk for fall due to: Comment - - - not a fall risk -   Depression Screen PHQ 2/9 Scores 04/22/2017 11/26/2016 04/16/2016 04/25/2014  PHQ - 2 Score 0 0 0 0     Cognitive Function     6CIT Screen 04/22/2017 04/16/2016  What Year? 0  points 0 points  What month? 0 points 0 points  What time? 0 points 0 points  Count back from 20 0 points 0 points  Months in reverse 0 points 0 points  Repeat phrase 2 points 0 points  Total Score 2 0    Immunization History  Administered Date(s) Administered  . Influenza Split 05/28/2012  . Influenza Whole 05/21/2005, 05/19/2007, 04/27/2008, 03/29/2010, 03/14/2011  . Influenza, High Dose Seasonal PF 04/22/2017  . Influenza,inj,Quad PF,6+ Mos 04/15/2013, 04/25/2014, 04/17/2015, 04/16/2016  . Pneumococcal Conjugate-13 04/25/2014  . Pneumococcal Polysaccharide-23 07/22/2007, 06/12/2009  . Td 08/15/2010  . Tetanus 07/21/2005  . Zoster 03/29/2010   Screening Tests Health Maintenance  Topic Date Due  . MAMMOGRAM  07/10/2017  . TETANUS/TDAP  08/15/2020  . INFLUENZA VACCINE  Completed  . DEXA SCAN  Completed  . PNA vac Low Risk Adult  Completed      Plan:   Medicare wellness exam  I have personally reviewed and noted the following in the patient's chart:   . Medical and social history- updated.  . Use of alcohol, tobacco or illicit drugs - up dated.  . Current medications and supplements - updated.  . Functional ability and status - Yes . Nutritional status . Physical activity - walking some . Advanced directives Yes . List of other physicians . Hospitalizations, surgeries, and ER visits in previous 12 months . Vitals . Screenings to include cognitive, depression, and falls - updated. - updated.  . Hypothyroid - due to recheck TSH. . Breast tenderness - schedule for diagnostic mammogram.   In addition, I have reviewed and discussed with patient certain preventive protocols, quality metrics, and best practice recommendations. A written personalized care plan for preventive services as well as general preventive health recommendations were provided to patient.     Beatrice Lecher, MD  04/22/2017

## 2017-04-30 ENCOUNTER — Other Ambulatory Visit: Payer: Self-pay | Admitting: Family Medicine

## 2017-04-30 DIAGNOSIS — Z1231 Encounter for screening mammogram for malignant neoplasm of breast: Secondary | ICD-10-CM

## 2017-05-04 ENCOUNTER — Ambulatory Visit
Admission: RE | Admit: 2017-05-04 | Discharge: 2017-05-04 | Disposition: A | Discharge status: Home / Self Care | Payer: 59 | Source: Ambulatory Visit | Attending: Family Medicine | Admitting: Family Medicine

## 2017-05-04 DIAGNOSIS — N644 Mastodynia: Secondary | ICD-10-CM

## 2017-05-04 HISTORY — DX: Personal history of irradiation: Z92.3

## 2017-05-04 HISTORY — DX: Personal history of antineoplastic chemotherapy: Z92.21

## 2017-05-12 DIAGNOSIS — L814 Other melanin hyperpigmentation: Secondary | ICD-10-CM | POA: Diagnosis not present

## 2017-05-12 DIAGNOSIS — D225 Melanocytic nevi of trunk: Secondary | ICD-10-CM | POA: Diagnosis not present

## 2017-05-12 DIAGNOSIS — L853 Xerosis cutis: Secondary | ICD-10-CM | POA: Diagnosis not present

## 2017-05-12 DIAGNOSIS — L718 Other rosacea: Secondary | ICD-10-CM | POA: Diagnosis not present

## 2017-05-12 DIAGNOSIS — L821 Other seborrheic keratosis: Secondary | ICD-10-CM | POA: Diagnosis not present

## 2017-05-12 DIAGNOSIS — D485 Neoplasm of uncertain behavior of skin: Secondary | ICD-10-CM | POA: Diagnosis not present

## 2017-05-12 DIAGNOSIS — L570 Actinic keratosis: Secondary | ICD-10-CM | POA: Diagnosis not present

## 2017-05-27 ENCOUNTER — Telehealth: Payer: Self-pay | Admitting: *Deleted

## 2017-05-27 NOTE — Telephone Encounter (Signed)
I called pt back and lvm asking that she rtn call about what type of dental work she will be getting.   Looked back thru her chart and found the note where she had an US done (01/25/2014) with the following findings:  Call patient: Ultrasound shows overall normal function of the heart. Little bit of regurgitation of the aortic valve, tricuspid valve, and mitral valve. They actually did not see any prolapse of the mitral valve. The mild regurgitation is not concerning or worrisome. It is very mild and does not affect the overall functioning of the heart. But it can cause a murmur. No further evaluation needed.Kathleen [name], [place]

## 2017-05-27 NOTE — Telephone Encounter (Signed)
Pt called and lvm stating that she is being seen by a new dentist and will need a letter stating that she is ok to be treated. She reports that they are concerned about her previous hx of the MVP and heart murmur.  She asked that the letter be faxed to [phone].Audelia Hives [place]

## 2017-06-01 NOTE — Telephone Encounter (Signed)
Letter faxed.

## 2017-06-01 NOTE — Telephone Encounter (Signed)
OK for letter for her to be treated by dentis. Please note in letter she doesn't have MVP.

## 2017-06-23 DIAGNOSIS — C44629 Squamous cell carcinoma of skin of left upper limb, including shoulder: Secondary | ICD-10-CM | POA: Diagnosis not present

## 2017-06-23 DIAGNOSIS — L905 Scar conditions and fibrosis of skin: Secondary | ICD-10-CM | POA: Diagnosis not present

## 2017-07-01 DIAGNOSIS — R1110 Vomiting, unspecified: Secondary | ICD-10-CM | POA: Diagnosis not present

## 2017-07-01 DIAGNOSIS — R61 Generalized hyperhidrosis: Secondary | ICD-10-CM | POA: Diagnosis not present

## 2017-07-01 DIAGNOSIS — R4020 Unspecified coma: Secondary | ICD-10-CM | POA: Diagnosis not present

## 2017-07-01 DIAGNOSIS — R112 Nausea with vomiting, unspecified: Secondary | ICD-10-CM | POA: Diagnosis not present

## 2017-07-01 DIAGNOSIS — R404 Transient alteration of awareness: Secondary | ICD-10-CM | POA: Diagnosis not present

## 2017-07-01 DIAGNOSIS — R55 Syncope and collapse: Secondary | ICD-10-CM | POA: Diagnosis not present

## 2017-07-02 ENCOUNTER — Encounter: Payer: Self-pay | Admitting: Family Medicine

## 2017-07-02 ENCOUNTER — Ambulatory Visit (INDEPENDENT_AMBULATORY_CARE_PROVIDER_SITE_OTHER): Payer: 59 | Admitting: Family Medicine

## 2017-07-02 VITALS — BP 160/71 | HR 74 | Ht 62.0 in | Wt 116.0 lb

## 2017-07-02 DIAGNOSIS — E860 Dehydration: Secondary | ICD-10-CM | POA: Diagnosis not present

## 2017-07-02 DIAGNOSIS — E876 Hypokalemia: Secondary | ICD-10-CM

## 2017-07-02 DIAGNOSIS — R55 Syncope and collapse: Secondary | ICD-10-CM | POA: Diagnosis not present

## 2017-07-02 NOTE — Progress Notes (Signed)
Subjective:    Patient ID: Kathleen [name], female    DOB: October 08, 1940, 76 y.o.   MRN: [id number]  HPI  76 year old female is here today to follow-up for emergency department yesterday after experiencing a syncopal event.  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  Actually call hurt her so she did not hit the floor.  She was out for about 90 seconds per her coworkers.  When she woke up she heard the ambulance sirens.  They did end up taking her to the emergency department over at [facility] for further evaluation.  When the ambulance arrived she actually vomited multiple times.  She had a negative cardiac workup and was eventually discharged home.  She was told that she was dehydrated and potassium was low based on her lab results.  Had not eaten for about 24 hours before the episode.  She admits that she has been working very long hours and she is stressed.  She still has not able to have her dental work done.  She call back in November to let us know that we need to send a letter valve prolapse.  We actually went and looked back at her last echocardiogram from 2015 and they actually said she had a normal mitral valve we had faxed them a letter on November 12.  But evidently they still would not schedule her for an appointment.  Review of Systems  BP (!) 160/71   Pulse 74   Ht 5\' 2"  (1.575 m)   Wt 116 lb (52.6 kg)   BMI 21.22 kg/m     Allergies  Allergen Reactions  . Streptomycin Other (See Comments)    Passed out  . Codeine   . Penicillins   . Sulfonamide Derivatives   . Benicar [Olmesartan] Other (See Comments)    Diizzy  . Hctz [Hydrochlorothiazide] Other (See Comments)    Vertigo, dizziness.   . Lisinopril Other (See Comments)    lightheaded    Past Medical History:  Diagnosis Date  . Cancer ([place])    Hx RT breast infiltrated ductal CA previously on tamoxifen and  armidex   . Herpes simplex    lt eye  . Hypertension   . Kidney stones    history  . MVP (mitral valve prolapse)    asymptomatic  . Personal history of chemotherapy   . Personal history of radiation therapy     Past Surgical History:  Procedure Laterality Date  . ABDOMINAL HYSTERECTOMY     partial  . BREAST LUMPECTOMY Right 2000   RT  . CERVICAL LAMINECTOMY  1986  . SHOULDER SURGERY     rotator cuff repair  . throidectomy  1988   For cancer     Social History   Socioeconomic History  . Marital status: Married    Spouse name: Not on file  . Number of children: Not on file  . Years of education: Not on file  . Highest education level: Not on file  Social Needs  . Financial resource strain: Not on file  . Food insecurity - worry: Not on file  . Food insecurity - inability: Not on file  . Transportation needs - medical: Not on file  . Transportation needs - non-medical: Not on file  Occupational History  . Not on file  Tobacco Use  . Smoking status: Never Smoker  .  Smokeless tobacco: Never Used  Substance and Sexual Activity  . Alcohol use: No  . Drug use: Not on file  . 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  Other Topics Concern  . Not on file  Social History Narrative  . Not on file    Family History  Problem Relation Age of Onset  . Breast cancer Mother     Outpatient Encounter Medications as of 07/02/2017  Medication Sig  . SYNTHROID 75 MCG tablet TAKE ONE (1) TABLET EACH DAY BEFORE BREAKFAST  . valACYclovir (VALTREX) 500 MG tablet Take 1 tablet (500 mg total) by mouth daily.   No facility-administered encounter medications on file as of 07/02/2017.          Objective:   Physical Exam  Constitutional: She is oriented to person, place, and time. She appears well-developed and well-nourished.  HENT:  Head: Normocephalic and  atraumatic.  Cardiovascular: Normal rate, regular rhythm and normal heart sounds.  Pulmonary/Chest: Effort normal and breath sounds normal.  Neurological: She is alert and oriented to person, place, and time.  Skin: Skin is warm and dry.  Psychiatric: She has a normal mood and affect. Her behavior is normal.       Assessment & Plan:  Syncope-she is feeling much better.  It sounds like it was multifactorial and that she had not eaten in 24 hours and she was also dehydrated and hypokalemic.  The hypokalemia could have come secondary to the vomiting that occurred after the syncopal episode.  She had a negative cardiac workup which is very reassuring.  We had a long discussion today about self-care and making sure that she is taking her breaks at work she is really pushing water and hydrating which has been an ongoing issue for several years and also taking time to eat.  We also discussed increasing meal prep at home so that she can take a lunch every day that is for filling and nourishing.  And still make healthy choices.  Hypokalemia-she is try to work on eating potassium rich foods.  Recommended prunes she likes those.  Recheck BMP in 1 week.  Dehydration.  See note above.  Continue to encourage fluids.  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  Spent 40 minutes, greater than 50% of time spent counseling about syncope, dehydration and self-care.

## 2017-07-08 ENCOUNTER — Encounter: Payer: Self-pay | Admitting: Sports Medicine

## 2017-07-08 ENCOUNTER — Ambulatory Visit (INDEPENDENT_AMBULATORY_CARE_PROVIDER_SITE_OTHER): Payer: 59 | Admitting: Sports Medicine

## 2017-07-08 ENCOUNTER — Ambulatory Visit (INDEPENDENT_AMBULATORY_CARE_PROVIDER_SITE_OTHER): Payer: 59

## 2017-07-08 DIAGNOSIS — M170 Bilateral primary osteoarthritis of knee: Secondary | ICD-10-CM

## 2017-07-08 DIAGNOSIS — M25552 Pain in left hip: Secondary | ICD-10-CM

## 2017-07-08 DIAGNOSIS — M1612 Unilateral primary osteoarthritis, left hip: Secondary | ICD-10-CM | POA: Diagnosis not present

## 2017-07-08 LAB — BASIC METABOLIC PANEL WITH GFR
BUN: 21 mg/dL (ref 7–25)
CO2: 29 mmol/L (ref 20–32)
CREATININE: 0.75 mg/dL (ref 0.60–0.93)
Calcium: 9.6 mg/dL (ref 8.6–10.4)
Chloride: 104 mmol/L (ref 98–110)
GFR, EST NON AFRICAN AMERICAN: 77 mL/min/{1.73_m2} (ref >=60)
GFR, Est African American: 89 mL/min/{1.73_m2} (ref >=60)
GLUCOSE: 102 mg/dL — AB (ref 65–99)
Potassium: 4.1 mmol/L (ref 3.5–5.3)
SODIUM: 140 mmol/L (ref 135–146)

## 2017-07-08 NOTE — Assessment & Plan Note (Signed)
Recurrence of left knee pain Last injection was 3 months ago. Repeat knee injection today, this time with Monovisc. Adding formal physical therapy. She did have some hip pain as well, adding a hip x-ray. Return to see me in 1 month.

## 2017-07-08 NOTE — Progress Notes (Signed)
All labs are normal. 

## 2017-07-08 NOTE — Assessment & Plan Note (Signed)
X-rays, left hip joint injection. Some of her pain may be radicular, I have now taken the hip and the knee out of the equation with intra-articular numbing agents and steroids. If she continues to have pain we will evaluate her lumbar spine.

## 2017-07-08 NOTE — Progress Notes (Signed)
Subjective:    CC: Left leg pain  HPI:   Kathleen [name] is a pleasant 76 year old female, she has known left knee osteoarthritis.  We did injected this knee back in September, but she did not remember.  Now describing pain in her left groin, left knee, sometimes with radiation down to the left foot.  She does have difficulty going up and down stairs and starting with her left leg.  No mechanical symptoms, no trauma.  Past medical history:  Negative.  See flowsheet/record as well for more information.  Surgical history: Negative.  See flowsheet/record as well for more information.  Family history: Negative.  See flowsheet/record as well for more information.  Social history: Negative.  See flowsheet/record as well for more information.  Allergies, and medications have been entered into the medical record, reviewed, and no changes needed.   (To billers/coders, pertinent past medical, social, surgical, family history can be found in problem list, if problem list is marked as reviewed then this indicates that past medical, social, surgical, family history was also reviewed)  Review of Systems: No fevers, chills, night sweats, weight loss, chest pain, or shortness of breath.   Objective:    General: Well Developed, well nourished, and in no acute distress.  Neuro: Alert and oriented x3, extra-ocular muscles intact, sensation grossly intact.  HEENT: Normocephalic, atraumatic, pupils equal round reactive to light, neck supple, no masses, no lymphadenopathy, thyroid nonpalpable.  Skin: Warm and dry, no rashes. Cardiac: Regular rate and rhythm, no murmurs rubs or gallops, no lower extremity edema.  Respiratory: Clear to auscultation bilaterally. Not using accessory muscles, speaking in full sentences. Left knee: Swollen, minimal fluid wave, tender at the medial joint line ROM normal in flexion and extension and lower leg rotation. Ligaments with solid consistent endpoints including ACL, PCL, LCL,  MCL. Negative Mcmurray's and provocative meniscal tests. Non painful patellar compression. Patellar and quadriceps tendons unremarkable. Hamstring and quadriceps strength is normal. Left hip: ROM IR: 45 degrees, reproduces pain, ER: 60 Deg, Flexion: 120 Deg, Extension: 100 Deg, Abduction: 45 Deg, Adduction: 45 Deg Strength IR: 5/5, ER: 5/5, Flexion: 5/5, Extension: 5/5, Abduction: 5/5, Adduction: 5/5 Pelvic alignment unremarkable to inspection and palpation. Standing hip rotation and gait without trendelenburg / unsteadiness. Greater trochanter without tenderness to palpation. No tenderness over piriformis. No SI joint tenderness and normal minimal SI movement.  Procedure: Real-time Ultrasound Guided Injection of left knee Device: GE Logiq E  Verbal informed consent obtained.  Time-out conducted.  Noted no overlying erythema, induration, or other signs of local infection.  Skin prepped in a sterile fashion.  Local anesthesia: Topical Ethyl chloride.  With sterile technique and under real time ultrasound guidance: Using a 22-gauge needle advanced into the suprapatellar recess and injected 1 cc kenalog 40, 2 cc lidocaine, 2 cc bupivacaine, syringe switched and I then injected a syringe of Monovisc Completed without difficulty  Pain immediately resolved suggesting accurate placement of the medication.  Advised to call if fevers/chills, erythema, induration, drainage, or persistent bleeding.  Images permanently stored and available for review in the ultrasound unit.  Impression: Technically successful ultrasound guided injection.  Procedure: Real-time Ultrasound Guided Injection of left hip joint Device: GE Logiq E  Verbal informed consent obtained.  Time-out conducted.  Noted no overlying erythema, induration, or other signs of local infection.  Skin prepped in a sterile fashion.  Local anesthesia: Topical Ethyl chloride.  With sterile technique and under real time ultrasound  guidance: Using a 22-gauge spinal needle I advanced  to the femoral head/neck junction, contacted bone and then injected 1 cc kenalog 40, 2 cc lidocaine, 2 cc bupivacaine. Completed without difficulty  Pain immediately resolved suggesting accurate placement of the medication.  Advised to call if fevers/chills, erythema, induration, drainage, or persistent bleeding.  Images permanently stored and available for review in the ultrasound unit.  Impression: Technically successful ultrasound guided injection.  Impression and Recommendations:    Primary osteoarthritis of both knees Recurrence of left knee pain Last injection was 3 months ago. Repeat knee injection today, this time with Monovisc. Adding formal physical therapy. She did have some hip pain as well, adding a hip x-ray. Return to see me in 1 month.  Primary osteoarthritis of left hip X-rays, left hip joint injection. Some of her pain may be radicular, I have now taken the hip and the knee out of the equation with intra-articular numbing agents and steroids. If she continues to have pain we will evaluate her lumbar spine.  ___________________________________________ Gwen Her. Dianah Field, M.D., ABFM., CAQSM. Primary Care and [employer] In[employer] of [employer] [facility]

## 2017-07-12 DIAGNOSIS — L03012 Cellulitis of left finger: Secondary | ICD-10-CM | POA: Diagnosis not present

## 2017-07-12 DIAGNOSIS — W268XXA Contact with other sharp object(s), not elsewhere classified, initial encounter: Secondary | ICD-10-CM | POA: Diagnosis not present

## 2017-07-12 DIAGNOSIS — Y998 Other external cause status: Secondary | ICD-10-CM | POA: Diagnosis not present

## 2017-07-12 DIAGNOSIS — S61012A Laceration without foreign body of left thumb without damage to nail, initial encounter: Secondary | ICD-10-CM | POA: Diagnosis not present

## 2017-07-12 DIAGNOSIS — Z23 Encounter for immunization: Secondary | ICD-10-CM | POA: Diagnosis not present

## 2017-07-14 IMAGING — MR MR LUMBAR SPINE W/O CM
4 of 5 series · 25 of 48 positions shown · non-contrast
Comparison: 12/03/2016 lumbar radiographs. 05/30/2013 CT of the
abdomen and pelvis. All a bowl eft

CLINICAL DATA: 76 y/o F; 2-3 months of lower back pain,
progressive, radiating to the left leg.

EXAM:
MRI LUMBAR SPINE WITHOUT CONTRAST
TECHNIQUE: Multiplanar, multisequence MR imaging of the lumbar spine was
performed. No intravenous contrast was administered.

[Series 2: T2 · sagittal · 4.0mm · 0.81mm/px · 7 of 15 slices shown (1 of 2)]
[im 1/15]
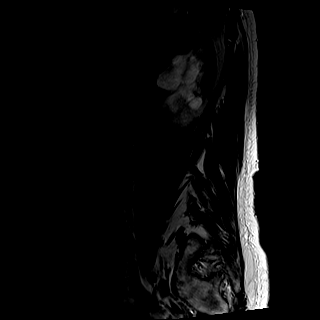
[im 3/15]
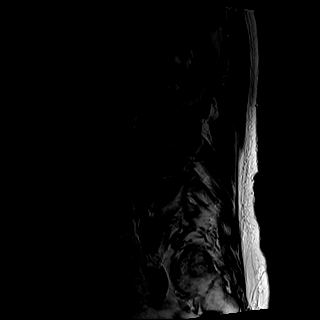
[im 5/15]
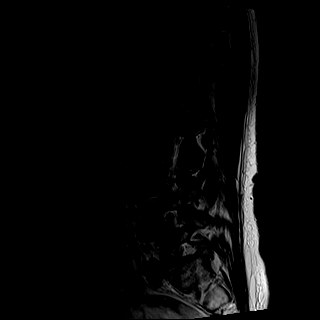
[im 8/15]
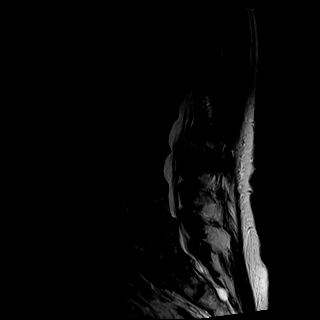
[im 10/15]
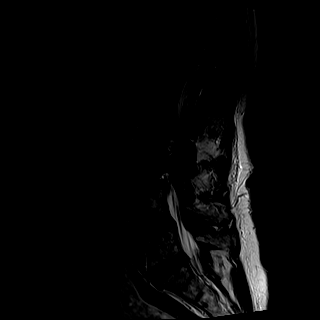
[im 12/15]
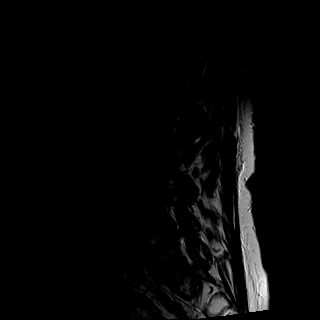
[im 15/15]
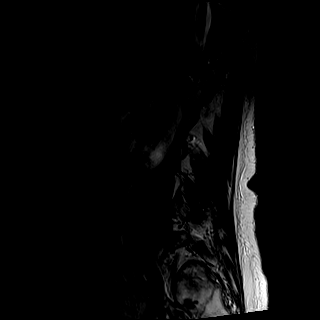

[Series 3: T1 · sagittal · 4.0mm · 0.41mm/px · 7 of 15 slices shown (1 of 2)]
[im 1/15]
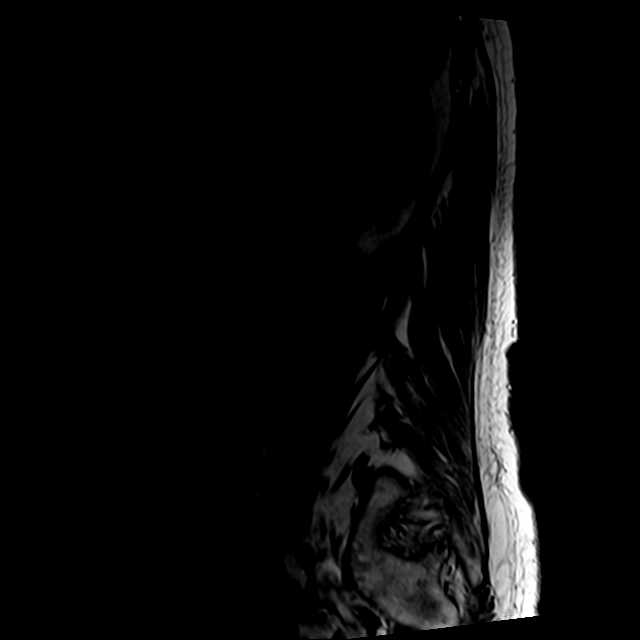
[im 3/15]
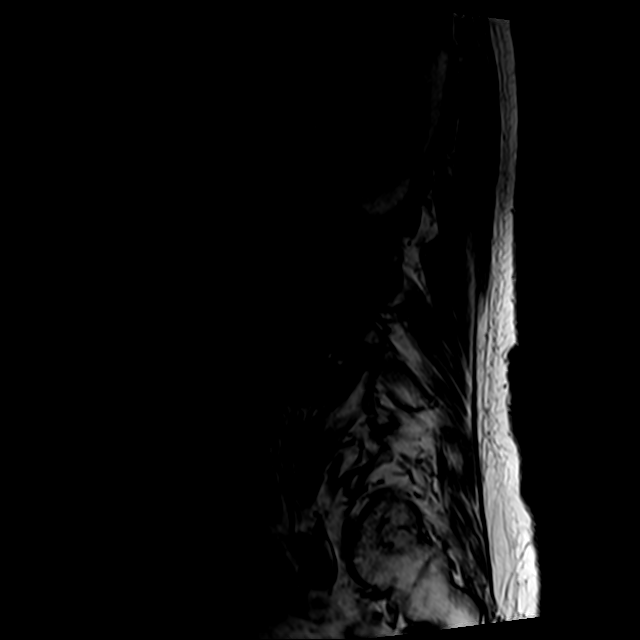
[im 5/15]
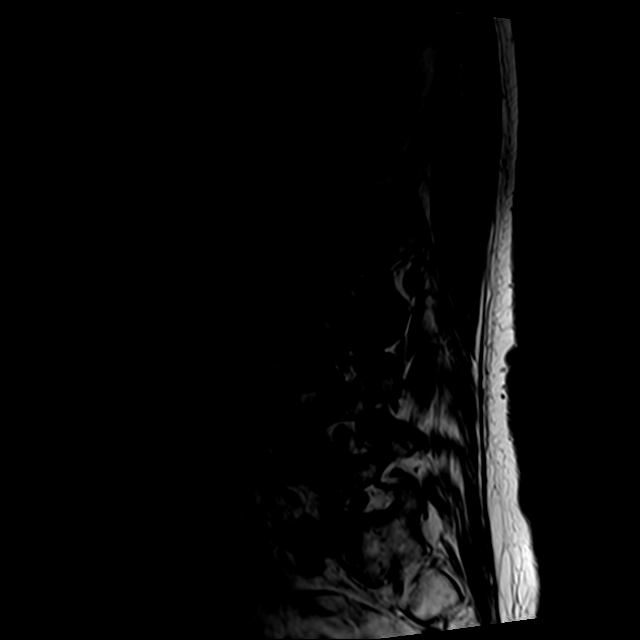
[im 8/15]
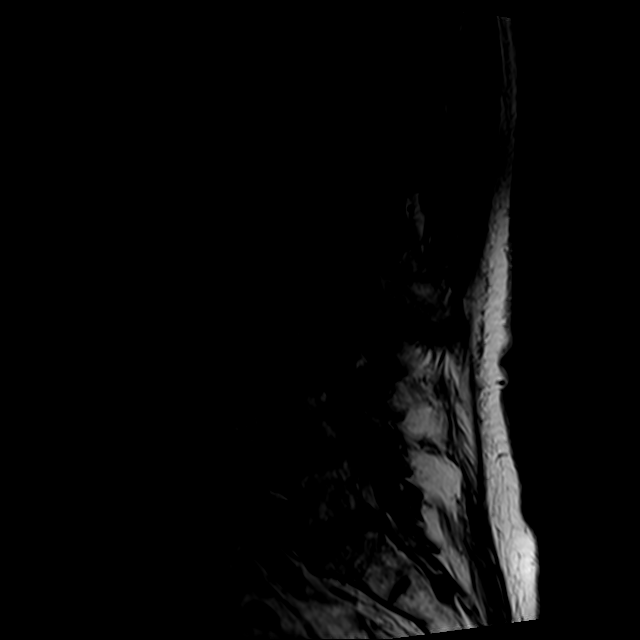
[im 10/15]
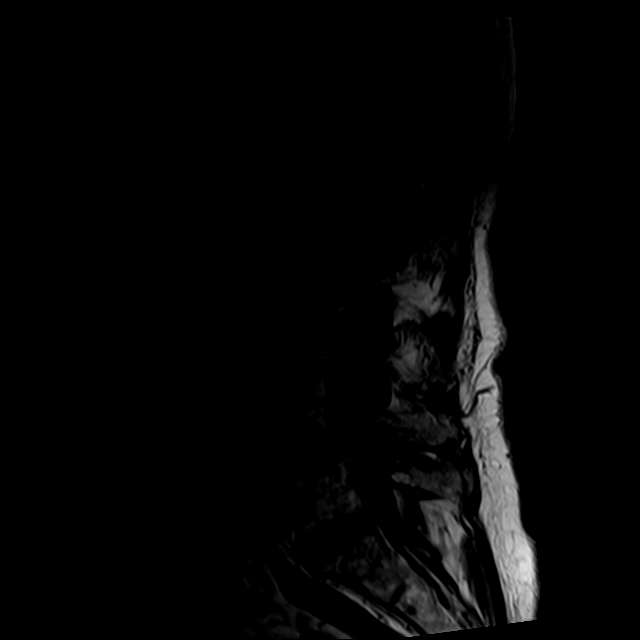
[im 12/15]
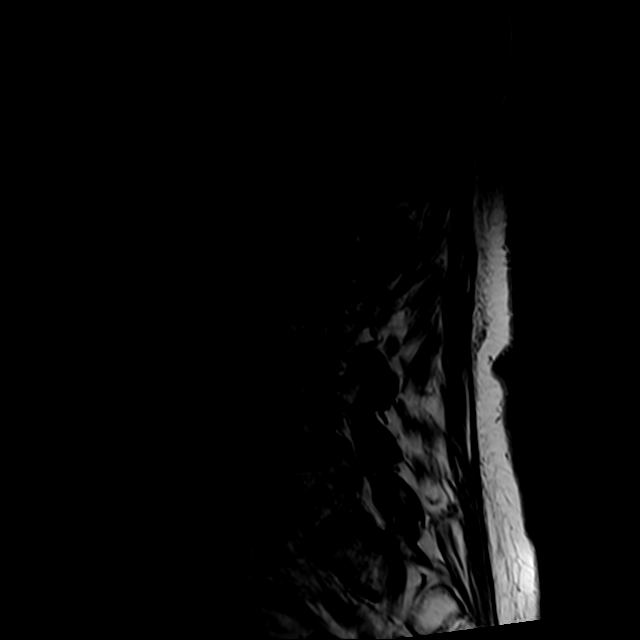
[im 15/15]
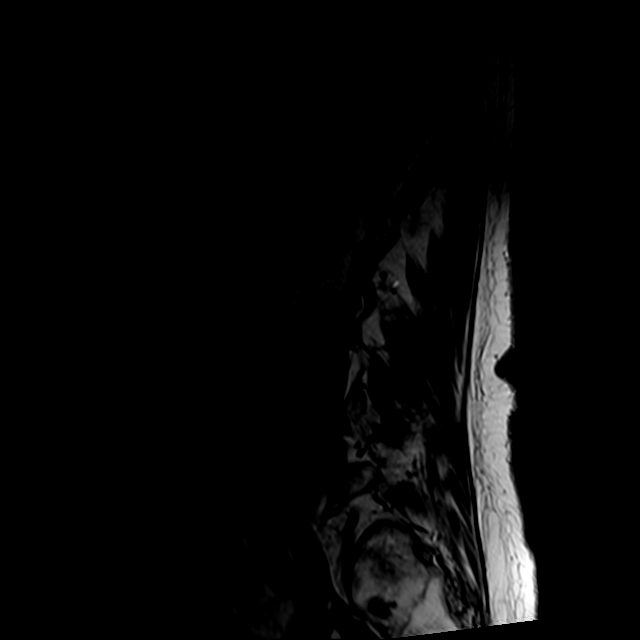

[Series 6: T1 · axial · 4.0mm · 0.39mm/px · z∈[-20,+128]mm · 3 of 33 slices shown (2 of 2)]
[im 5/33]
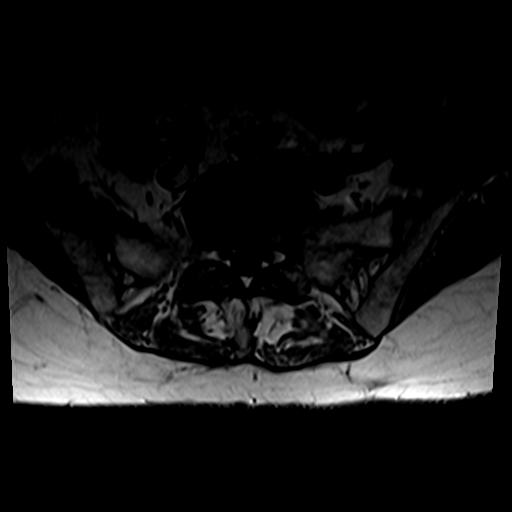
[im 18/33]
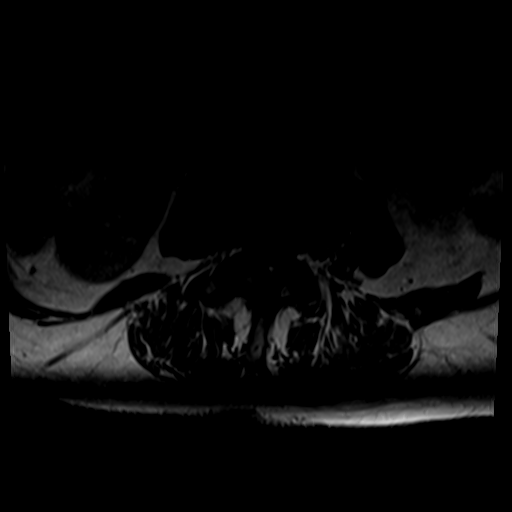
[im 28/33]
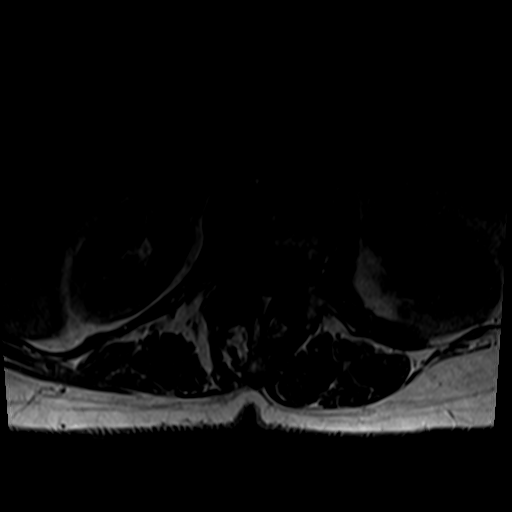

[Series 100: T2 · axial · 4.0mm · 0.78mm/px · z∈[-38,+153]mm · 8 of 33 slices shown (2 of 2)]
[im 1/33]
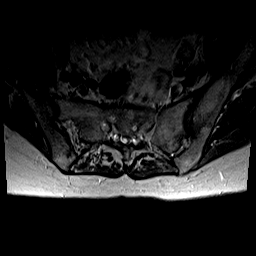
[im 5/33]
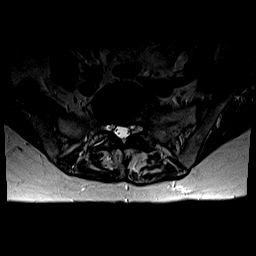
[im 10/33]
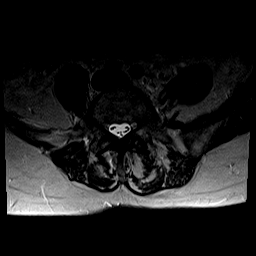
[im 15/33]
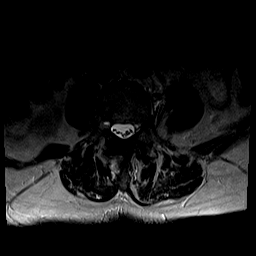
[im 18/33]
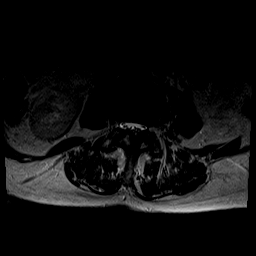
[im 23/33]
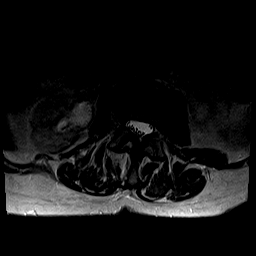
[im 28/33]
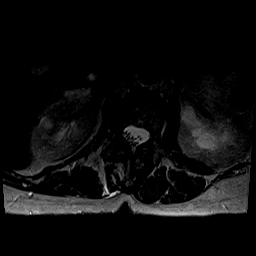
[im 33/33]
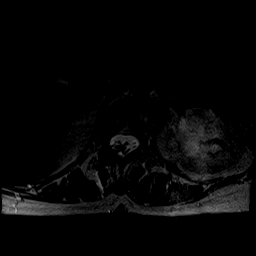

[25 of 48 positions shown; findings below may reference images not displayed]

FINDINGS: Segmentation:  Standard.

Alignment: Moderate levocurvature with apex at L3. Normal lumbar
lordosis. Minimal grade 1 L3-4 and 2 mm grade 1 L4-5
anterolisthesis.

Vertebrae: No fracture, evidence of discitis, or bone lesion. Mild
edema surrounding the right L3-4 facet of. Mild edema within the
L5-S1 right-sided facets and small facet joint effusion.

Conus medullaris: Extends to the L1-2 level and appears normal.

Paraspinal and other soft tissues: Humerus left kidney small
parapelvic cysts and right kidney interpolar cyst measuring 11 mm.

Disc levels:

L1-2: Small disc bulge eccentric to the right and right greater than
left facet hypertrophy. Mild right foraminal narrowing. No
significant canal stenosis.

L2-3: Small disc bulge eccentric to the right with right greater
than left facet and ligamentum flavum hypertrophy. Mild right-sided
foraminal and lateral recess narrowing. No significant canal
stenosis.

L3-4: Small slightly uncovered disc bulge with moderate bilateral
facet and ligamentum flavum hypertrophy. Mild bilateral foraminal
and lateral recess narrowing. Mild-to-moderate canal stenosis.

L4-5: Small uncovered disc bulge with advanced facet and moderate
ligamentum flavum hypertrophy greater on the left. Mild
left-greater-than-right foraminal and lateral recess narrowing.
Moderate canal stenosis.

L5-S1: Small disc bulge with moderate bilateral facet hypertrophy.
Mild lateral recess narrowing. No significant foraminal narrowing or
canal stenosis.
IMPRESSION: 1. Moderate levocurvature with apex at L3. Minimal grade 1
anterolisthesis at L3-4 and L4-5.
2. Mild edema surrounding the right L3-4 and mild edema with small
effusion within the right L5-S1 facets, likely degenerative.
3. Lumbar spondylosis with mild to moderate L3-4 and moderate L4-5
canal stenosis. No high-grade canal stenosis.
4. Multilevel mild foraminal narrowing. No high-grade foraminal
narrowing.

By: Treasure Duy M.D.

## 2017-07-15 ENCOUNTER — Ambulatory Visit
Admission: RE | Admit: 2017-07-15 | Discharge: 2017-07-15 | Disposition: A | Discharge status: Home / Self Care | Payer: 59 | Source: Ambulatory Visit | Attending: Family Medicine | Admitting: Family Medicine

## 2017-07-15 ENCOUNTER — Ambulatory Visit (INDEPENDENT_AMBULATORY_CARE_PROVIDER_SITE_OTHER): Payer: 59 | Admitting: Osteopathic Medicine

## 2017-07-15 ENCOUNTER — Encounter: Payer: Self-pay | Admitting: Osteopathic Medicine

## 2017-07-15 VITALS — BP 175/76 | HR 76 | Temp 98.2°F | Wt 115.1 lb

## 2017-07-15 DIAGNOSIS — L03119 Cellulitis of unspecified part of limb: Secondary | ICD-10-CM | POA: Diagnosis not present

## 2017-07-15 DIAGNOSIS — Z1231 Encounter for screening mammogram for malignant neoplasm of breast: Secondary | ICD-10-CM

## 2017-07-15 DIAGNOSIS — L02519 Cutaneous abscess of unspecified hand: Secondary | ICD-10-CM

## 2017-07-15 MED ORDER — DOXYCYCLINE HYCLATE 100 MG PO TABS: 100.0000 mg | ORAL_TABLET | Freq: Two times a day (BID) | ORAL | 0 refills | Status: DC

## 2017-07-15 NOTE — Progress Notes (Signed)
HPI: Kathleen [name] is a 76 y.o. female who  has a past medical history of Cancer ([place]), Herpes simplex, Hypertension, Kidney stones, MVP (mitral valve prolapse), Personal history of chemotherapy, and Personal history of radiation therapy.  she presents to [facility] today, 07/15/17,  for chief complaint of:  Chief Complaint  Patient presents with  . Wound Infection    . L thumb pain, severe, patch of yellowish pus under the skin and red joint causing significant pain. Overall, redness is better and receded from border drawn by ER, but tense pocket of what appears to be pus at DIP is very painful.  . Reviewed records from ER: Visits 07/12/2017 and 07/13/2017. Reports cut her left thumb on a metal broom handle 2 days prior with subsequent redness, swelling, pain, warmth. Morning of 07/12/2017: X-ray demonstrated soft tissue swelling around the thumb, no foreign body noted. Was prescribed Levaquin for cellulitis of left thumb - redness was outlined with a skin marker, aluminum splint placed. Morning of 07/13/2017 presented for wound check - reported decreased symptoms after IV Levaquin in the ED, pain and swelling have gone down a bit as well.     Past medical, surgical, social and family history reviewed:  Patient Active Problem List   Diagnosis Date Noted  . Primary osteoarthritis of left hip 07/08/2017  . Primary osteoarthritis of both knees 02/25/2017  . Lumbar spondylosis 12/03/2016  . Corn of foot 11/26/2016  . History of breast cancer 11/09/2012  . Benign hematuria 07/05/2010  . POSTMENOPAUSAL STATUS 03/29/2010  . IMPAIRED FASTING GLUCOSE 12/11/2008  . LEG PAIN, BILATERAL 01/20/2008  . HIP PAIN, RIGHT 01/05/2008  . Disorder of bone and cartilage 02/26/2007  . Hypothyroidism 04/28/2006  . White coat syndrome without diagnosis of hypertension 04/28/2006  . MITRAL VALVE DISORDER 04/28/2006    Past Surgical History:  Procedure Laterality Date   . ABDOMINAL HYSTERECTOMY     partial  . BREAST BIOPSY    . BREAST LUMPECTOMY Right 2000   RT  . CERVICAL LAMINECTOMY  1986  . SHOULDER SURGERY     rotator cuff repair  . throidectomy  1988   For cancer     Social History   Tobacco Use  . Smoking status: Never Smoker  . Smokeless tobacco: Never Used  Substance Use Topics  . Alcohol use: No    Family History  Problem Relation Age of Onset  . Breast cancer Mother      Current medication list and allergy/intolerance information reviewed:    Current Outpatient Medications  Medication Sig Dispense Refill  . SYNTHROID 75 MCG tablet TAKE ONE (1) TABLET EACH DAY BEFORE BREAKFAST 30 tablet 3  . valACYclovir (VALTREX) 500 MG tablet Take 1 tablet (500 mg total) by mouth daily. 30 tablet 3   No current facility-administered medications for this visit.     Allergies  Allergen Reactions  . Streptomycin Other (See Comments)    Passed out  . Codeine   . Penicillins   . Sulfonamide Derivatives   . Benicar [Olmesartan] Other (See Comments)    Diizzy  . Hctz [Hydrochlorothiazide] Other (See Comments)    Vertigo, dizziness.   . Lisinopril Other (See Comments)    lightheaded      Review of Systems:  Constitutional:  No  fever, no chill  Cardiac: No  chest pain  Respiratory:  No  shortness of breath.   Musculoskeletal: +new myalgia/arthralgia  Skin: +Rash,   Exam:  BP Marland Kitchen)  175/76 (BP Location: Left Arm)   Pulse 76   Temp 98.2 F (36.8 C) (Oral)   Wt 115 lb 1.9 oz (52.2 kg)   BMI 21.06 kg/m   Constitutional: VS see above. General Appearance: alert, well-developed, well-nourished, NAD  Eyes: Normal lids and conjunctive, non-icteric sclera  Ears, Nose, Mouth, Throat: MMM, Normal external inspection ears/nares/mouth/lips/gums.   Neck: No masses, trachea midline.   Respiratory: Normal respiratory effort.   Musculoskeletal/Skin: ROM DIP L thumb limited d/t pain, medial aspect shows superficial blister/pus w/  surrounding erythema but no evident joint effusion.   Neurological: Normal balance/coordination.   Psychiatric: Normal judgment/insight. Normal mood and affect. Oriented x3.    Procedure note:  PRE-OP DIAGNOSIS: Abscess of L thumb  POST-OP DIAGNOSIS: Same  PROCEDURE: incision and drainage of abscess Performing Physician: Sheppard Coil    PROCEDURE:  Patient informed consent obtained verbally after discussion of risks (including but not limited to pain, infection, bleeding, damage to surrounding tissues, incomplete evacuation/treatment of infection, recurrence, and in this particular case joint damage/infecion)  and benefits (adequate treatment and diagnosis, relief of pain). All questions answered prior to procedure.   A timeout protocol was performed prior to initiating the procedure.  The area was prepared and draped in the usual, sterile manner. We opted to forgo anesthesia and just try to puncture the abscess with 18G needle, 1+cc purulent material expressed. The abcess was explored thoroughly. Bleeding was minimal.    Followup: The patient tolerated the procedure well without complications. Standard post-procedure care is explained and return precautions are given, patient was provided with printed information & instructions.    ASSESSMENT/PLAN:    Cellulitis and abscess of hand - Plan: Wound culture    Patient Instructions  Plan:   Typically, lancing an infection like this fixes it visit better than anything else, but since this is so close to the joint, we are going to add an antibiotic to protect against possible MRSA infection. Continue the levofloxacin that you are taking, we are adding a medication called doxycycline to this.  Culture results will tell us what type of bacteria is growing in there, hopefully we will not need to change antibiotics based on this. Note, since you were already taking antibiotics, the culture may come back negative but it's good to get a sample just  in case.  You can take Tylenol 1000 mg up to 4 times a day, can take ibuprofen 800 mg up to 4 times per day. Use warm compresses over the thumb for tender 15 minutes 3-4 times per day, keep the wound clean and dry.  If anything changes or gets worse, come see [place] ASAP. Otherwise, plan to recheck the wound in 1-2 days with Dr. Sheppard Coil    Visit summary with medication list and pertinent instructions was printed for patient to review. All questions at time of visit were answered - patient instructed to contact office with any additional concerns. ER/RTC precautions were reviewed with the patient.   Follow-up plan: Return for wound recheck in 1-2 days (FYI for scheduler, okay to double book .  Note: Total time spent 25 minutes, greater than 50% of the visit was spent face-to-face counseling and coordinating care for the following: The encounter diagnosis was Cellulitis and abscess of hand..  Please note: voice recognition software was used to produce this document, and typos may escape review. Please contact Dr. Sheppard Coil for any needed clarifications.

## 2017-07-15 NOTE — Patient Instructions (Signed)
Plan:   Typically, lancing an infection like this fixes it visit better than anything else, but since this is so close to the joint, we are going to add an antibiotic to protect against possible MRSA infection. Continue the levofloxacin that you are taking, we are adding a medication called doxycycline to this.  Culture results will tell us what type of bacteria is growing in there, hopefully we will not need to change antibiotics based on this. Note, since you were already taking antibiotics, the culture may come back negative but it's good to get a sample just in case.  You can take Tylenol 1000 mg up to 4 times a day, can take ibuprofen 800 mg up to 4 times per day. Use warm compresses over the thumb for tender 15 minutes 3-4 times per day, keep the wound clean and dry.  If anything changes or gets worse, come see [place] ASAP. Otherwise, plan to recheck the wound in 1-2 days with Dr. Sheppard Coil

## 2017-07-16 ENCOUNTER — Encounter: Payer: Self-pay | Admitting: Osteopathic Medicine

## 2017-07-16 ENCOUNTER — Ambulatory Visit (INDEPENDENT_AMBULATORY_CARE_PROVIDER_SITE_OTHER): Payer: 59 | Admitting: Osteopathic Medicine

## 2017-07-16 VITALS — BP 170/74 | HR 83 | Temp 97.8°F

## 2017-07-16 DIAGNOSIS — L03119 Cellulitis of unspecified part of limb: Secondary | ICD-10-CM | POA: Diagnosis not present

## 2017-07-16 DIAGNOSIS — L02519 Cutaneous abscess of unspecified hand: Secondary | ICD-10-CM | POA: Diagnosis not present

## 2017-07-16 MED ORDER — ONDANSETRON 8 MG PO TBDP: 8.0000 mg | ORAL_TABLET | Freq: Three times a day (TID) | ORAL | 3 refills | Status: DC | PRN

## 2017-07-16 MED ORDER — HYDROCODONE-ACETAMINOPHEN 5-325 MG PO TABS: 1.0000 | ORAL_TABLET | Freq: Three times a day (TID) | ORAL | 0 refills | Status: DC | PRN

## 2017-07-16 NOTE — Progress Notes (Signed)
HPI: Kathleen [name] is a 76 y.o. female who  has a past medical history of Cancer ([place]), Herpes simplex, Hypertension, Kidney stones, MVP (mitral valve prolapse), Personal history of chemotherapy, and Personal history of radiation therapy.  she presents to [facility] today, 07/16/17,  for chief complaint of:  Chief Complaint  Patient presents with  . Wound Check    . L thumb pain, severe, patch of yellowish pus under the skin and red joint causing significant pain. We drained this yesterday (started Doxy and got culture) but now there is redness and a smaller pocket around the palmar aspect of the thumb     Past medical, surgical, social and family history reviewed:  Patient Active Problem List   Diagnosis Date Noted  . Primary osteoarthritis of left hip 07/08/2017  . Primary osteoarthritis of both knees 02/25/2017  . Lumbar spondylosis 12/03/2016  . Corn of foot 11/26/2016  . History of breast cancer 11/09/2012  . Benign hematuria 07/05/2010  . POSTMENOPAUSAL STATUS 03/29/2010  . IMPAIRED FASTING GLUCOSE 12/11/2008  . LEG PAIN, BILATERAL 01/20/2008  . HIP PAIN, RIGHT 01/05/2008  . Disorder of bone and cartilage 02/26/2007  . Hypothyroidism 04/28/2006  . White coat syndrome without diagnosis of hypertension 04/28/2006  . MITRAL VALVE DISORDER 04/28/2006    Past Surgical History:  Procedure Laterality Date  . ABDOMINAL HYSTERECTOMY     partial  . BREAST BIOPSY    . BREAST LUMPECTOMY Right 2000   RT  . CERVICAL LAMINECTOMY  1986  . SHOULDER SURGERY     rotator cuff repair  . throidectomy  1988   For cancer     Social History   Tobacco Use  . Smoking status: Never Smoker  . Smokeless tobacco: Never Used  Substance Use Topics  . Alcohol use: No    Family History  Problem Relation Age of Onset  . Breast cancer Mother      Current medication list and allergy/intolerance information reviewed:    Current Outpatient  Medications  Medication Sig Dispense Refill  . doxycycline (VIBRA-TABS) 100 MG tablet Take 1 tablet (100 mg total) by mouth 2 (two) times daily. 14 tablet 0  . SYNTHROID 75 MCG tablet TAKE ONE (1) TABLET EACH DAY BEFORE BREAKFAST 30 tablet 3  . valACYclovir (VALTREX) 500 MG tablet Take 1 tablet (500 mg total) by mouth daily. 30 tablet 3   No current facility-administered medications for this visit.     Allergies  Allergen Reactions  . Streptomycin Other (See Comments)    Passed out  . Codeine   . Penicillins   . Sulfonamide Derivatives   . Benicar [Olmesartan] Other (See Comments)    Diizzy  . Hctz [Hydrochlorothiazide] Other (See Comments)    Vertigo, dizziness.   . Lisinopril Other (See Comments)    lightheaded      Review of Systems:  Constitutional:  No  fever, no chills  Cardiac: No  chest pain  Respiratory:  No  shortness of breath.   Musculoskeletal: +myalgia/arthralgia  Skin: +Rash,   Exam:  BP (!) 170/74   Pulse 83   Temp 97.8 F (36.6 C) (Oral)   Constitutional: VS see above. General Appearance: alert, well-developed, well-nourished, NAD  Eyes: Normal lids and conjunctive, non-icteric sclera  Ears, Nose, Mouth, Throat: MMM, Normal external inspection ears/nares/mouth/lips/gums.   Neck: No masses, trachea midline.   Respiratory: Normal respiratory effort.   Musculoskeletal/Skin: ROM DIP L thumb limited d/t pain, medial aspect shows  superficial blister (better after lancing yesterday w/ surrounding erythema but no evident joint effusion. Superficial small abscess on palma aspect/pad of finger. Passive ROM DIP ok   Neurological: Normal balance/coordination.   Psychiatric: Normal judgment/insight. Normal mood and affect. Oriented x3.    Procedure note:  PRE-OP DIAGNOSIS: Abscess of L thumb  POST-OP DIAGNOSIS: Same  PROCEDURE: incision and drainage of abscess Performing Physician: Sheppard Coil    PROCEDURE:  Patient informed consent obtained  verbally after discussion of risks (including but not limited to pain, infection, bleeding, damage to surrounding tissues, incomplete evacuation/treatment of infection, recurrence, and in this particular case joint damage/infecion)  and benefits (adequate treatment and diagnosis, relief of pain). All questions answered prior to procedure.   A timeout protocol was performed prior to initiating the procedure.  The area was prepared and draped in the usual, sterile manner. Digital block with 5 cc 2% Xylocaine was administered. Scalpel was used to widen incision at dorsal aspect and at pad of finger, minimal purulent drainage. The abcess was explored thoroughly and packed. Bleeding was minimal.    Followup: The patient tolerated the procedure well without complications. Standard post-procedure care is explained and return precautions are given, patient was provided with printed information & instructions.    ASSESSMENT/PLAN:    Cellulitis and abscess of hand - RTC tomorrow for recheck    Patient Instructions  Keep appointment for tomorrow, will try pain medications with nausea medicine, otherwise continue Tylenol 1000 mg every 6 hours plus ibuprofen 800 mg every 6 hours    Visit summary with medication list and pertinent instructions was printed for patient to review. All questions at time of visit were answered - patient instructed to contact office with any additional concerns. ER/RTC precautions were reviewed with the patient.   Follow-up plan: RTC tomorrow as scheduled  Note: Total time spent 25 minutes, greater than 50% of the visit was spent face-to-face counseling and coordinating care for the following: The encounter diagnosis was Cellulitis and abscess of hand..  Please note: voice recognition software was used to produce this document, and typos may escape review. Please contact Dr. Sheppard Coil for any needed clarifications.

## 2017-07-16 NOTE — Patient Instructions (Signed)
Keep appointment for tomorrow, will try pain medications with nausea medicine, otherwise continue Tylenol 1000 mg every 6 hours plus ibuprofen 800 mg every 6 hours

## 2017-07-17 ENCOUNTER — Encounter: Payer: Self-pay | Admitting: Osteopathic Medicine

## 2017-07-17 ENCOUNTER — Ambulatory Visit (INDEPENDENT_AMBULATORY_CARE_PROVIDER_SITE_OTHER): Payer: 59 | Admitting: Osteopathic Medicine

## 2017-07-17 VITALS — BP 165/74 | HR 79 | Temp 97.7°F

## 2017-07-17 DIAGNOSIS — I10 Essential (primary) hypertension: Secondary | ICD-10-CM

## 2017-07-17 DIAGNOSIS — L03119 Cellulitis of unspecified part of limb: Principal | ICD-10-CM

## 2017-07-17 DIAGNOSIS — L02519 Cutaneous abscess of unspecified hand: Secondary | ICD-10-CM

## 2017-07-17 NOTE — Progress Notes (Signed)
HPI: Kathleen [name] is a 76 y.o. female who  has a past medical history of Cancer ([place]), Herpes simplex, Hypertension, Kidney stones, MVP (mitral valve prolapse), Personal history of chemotherapy, and Personal history of radiation therapy.  she presents to [facility] today, 07/17/17,  for chief complaint of:  Chief Complaint  Patient presents with  . Wound Check    . L thumb pain, here for wound recheck after more extensive I&D yesterday. Pain is better on medial thumb but worse/same on pad of thumb. Has been taking TYlenol extra strength only. Intolerant to opiates d/t N/V, I sent some to pharmacy w/ zofran just in case of severe pain, she has not taken these     Past medical, surgical, social and family history reviewed:  Patient Active Problem List   Diagnosis Date Noted  . Primary osteoarthritis of left hip 07/08/2017  . Primary osteoarthritis of both knees 02/25/2017  . Lumbar spondylosis 12/03/2016  . Corn of foot 11/26/2016  . History of breast cancer 11/09/2012  . Benign hematuria 07/05/2010  . POSTMENOPAUSAL STATUS 03/29/2010  . IMPAIRED FASTING GLUCOSE 12/11/2008  . LEG PAIN, BILATERAL 01/20/2008  . HIP PAIN, RIGHT 01/05/2008  . Disorder of bone and cartilage 02/26/2007  . Hypothyroidism 04/28/2006  . White coat syndrome without diagnosis of hypertension 04/28/2006  . MITRAL VALVE DISORDER 04/28/2006    Past Surgical History:  Procedure Laterality Date  . ABDOMINAL HYSTERECTOMY     partial  . BREAST BIOPSY    . BREAST LUMPECTOMY Right 2000   RT  . CERVICAL LAMINECTOMY  1986  . SHOULDER SURGERY     rotator cuff repair  . throidectomy  1988   For cancer     Social History   Tobacco Use  . Smoking status: Never Smoker  . Smokeless tobacco: Never Used  Substance Use Topics  . Alcohol use: No    Family History  Problem Relation Age of Onset  . Breast cancer Mother      Current medication list and  allergy/intolerance information reviewed:    Current Outpatient Medications  Medication Sig Dispense Refill  . doxycycline (VIBRA-TABS) 100 MG tablet Take 1 tablet (100 mg total) by mouth 2 (two) times daily. 14 tablet 0  . HYDROcodone-acetaminophen (NORCO/VICODIN) 5-325 MG tablet Take 1 tablet by mouth every 8 (eight) hours as needed for moderate pain or severe pain. 10 tablet 0  . ondansetron (ZOFRAN-ODT) 8 MG disintegrating tablet Take 1 tablet (8 mg total) by mouth every 8 (eight) hours as needed for nausea. 20 tablet 3  . SYNTHROID 75 MCG tablet TAKE ONE (1) TABLET EACH DAY BEFORE BREAKFAST 30 tablet 3  . valACYclovir (VALTREX) 500 MG tablet Take 1 tablet (500 mg total) by mouth daily. 30 tablet 3   No current facility-administered medications for this visit.     Allergies  Allergen Reactions  . Streptomycin Other (See Comments)    Passed out  . Codeine   . Penicillins   . Sulfonamide Derivatives   . Benicar [Olmesartan] Other (See Comments)    Diizzy  . Hctz [Hydrochlorothiazide] Other (See Comments)    Vertigo, dizziness.   . Lisinopril Other (See Comments)    lightheaded      Review of Systems:  Constitutional:  No  fever, no chills  Cardiac: No  chest pain  Respiratory:  No  shortness of breath.   Musculoskeletal: +myalgia/arthralgia  Skin: +Rash,   Exam:  BP (!) 165/74  Pulse 79   Temp 97.7 F (36.5 C)   Constitutional: VS see above. General Appearance: alert, well-developed, well-nourished, NAD  Eyes: Normal lids and conjunctive, non-icteric sclera  Ears, Nose, Mouth, Throat: MMM, Normal external inspection ears/nares/mouth/lips/gums.   Neck: No masses, trachea midline.   Respiratory: Normal respiratory effort.   Musculoskeletal/Skin: Active ROM DIP L thumb limited d/t pain but passive ROM intact and joint is nonpainful, medial aspect shows superficial blister without purulent drainage after removal of packing. Superficial small abscess on palmar  aspect also nonpurulent serous drainage after packing removal. Erythema is largely resolved  Neurological: Normal balance/coordination.   Psychiatric: Normal judgment/insight. Normal mood and affect. Oriented x3.     ASSESSMENT/PLAN:    Cellulitis and abscess of hand - see below  Hypertension, unspecified type - reports white coat syndrome, BP has been high several occasionas, recently pain of course likely making this worse. Pt would like to hold offf on med change.     Clinically appears better than past few days, pain is still an issue of course. Continue antibiotics, keep wounds clean, change bandages few times per day. Plan for recheck Monday, I will be out of the office but Dr Georgina Snell is aware of this patient and has agreed to see her, pt is aware of follow up plan, see below for full discussion    Discussed that clinically the joint does not appear to be affected, the erythema is almost all resolved compared to yesterday, the pain is likely to persist until more complete healing (and likely she is under treating her pain), and will await culture results/susceptibilities and change po abx if needed; but at this point I don't see that surgical referral or more extensive procedures or IV abx would be needed, though if the condition worsens again would seek emergency care in the hospital.   Social issues: Pt is very nervous to be losing her supplemental insurance at the beginning of the new year next week, she is very distraught that this issue with the finger may not be covered if she were to need extensive care, she is anxious in general about this infection and her inability to work because of it. I advised we would need to give this some time to heal and to resolve, and if it does get worse we will deal with that as it comes      Visit summary with medication list and pertinent instructions was printed for patient to review. All questions at time of visit were answered - patient  instructed to contact office with any additional concerns. ER/RTC precautions were reviewed with the patient.   Follow-up plan: RTC Monday  Note: Total time spent 25 minutes, greater than 50% of the visit was spent face-to-face counseling and coordinating care for the following: The primary encounter diagnosis was Cellulitis and abscess of hand. A diagnosis of Hypertension, unspecified type was also pertinent to this visit.Marland Kitchen  Please note: voice recognition software was used to produce this document, and typos may escape review. Please contact Dr. Sheppard Coil for any needed clarifications.

## 2017-07-18 LAB — WOUND CULTURE
MICRO NUMBER:: 81451956
SPECIMEN QUALITY:: ADEQUATE

## 2017-07-20 ENCOUNTER — Encounter: Payer: Self-pay | Admitting: Family Medicine

## 2017-07-20 ENCOUNTER — Ambulatory Visit (INDEPENDENT_AMBULATORY_CARE_PROVIDER_SITE_OTHER): Payer: 59 | Admitting: Family Medicine

## 2017-07-20 VITALS — BP 150/76 | HR 71 | Wt 116.0 lb

## 2017-07-20 DIAGNOSIS — L03012 Cellulitis of left finger: Secondary | ICD-10-CM | POA: Diagnosis not present

## 2017-07-20 NOTE — Patient Instructions (Addendum)
Thank you for coming in today. Finish Doxycyline  Recheck as needed.  Resume normal diet.   Recheck with Dr Madilyn Fireman in 1 month.   Return to work in 1 week.  Let me know if you will not be able to do that.

## 2017-07-20 NOTE — Progress Notes (Signed)
Kathleen [name] is a 76 y.o. female who presents to [facility] today for left thumb abscess.  Kathleen [name] has been seen several times by Dr. Sheppard Coil for paronychia and abscess of her left thumb.  She had incision and drainage x2 last week and her culture came back over the weekend revealing MSSA sensitive to doxycycline resistant to Cipro and Levaquin.  She was originally seen in the emergency department where she was prescribed Levaquin and had worsening symptoms.  She notes on doxycycline and following incision and drainage she feels a lot better.  She notes the redness and pain is significantly improved.  No fevers or chills.   Past Medical History:  Diagnosis Date  . Cancer ([place])    Hx RT breast infiltrated ductal CA previously on tamoxifen and armidex   . Herpes simplex    lt eye  . Hypertension   . Kidney stones    history  . MVP (mitral valve prolapse)    asymptomatic  . Personal history of chemotherapy   . Personal history of radiation therapy    Past Surgical History:  Procedure Laterality Date  . ABDOMINAL HYSTERECTOMY     partial  . BREAST BIOPSY    . BREAST LUMPECTOMY Right 2000   RT  . CERVICAL LAMINECTOMY  1986  . SHOULDER SURGERY     rotator cuff repair  . throidectomy  1988   For cancer    Social History   Tobacco Use  . Smoking status: Never Smoker  . Smokeless tobacco: Never Used  Substance Use Topics  . Alcohol use: No   family history includes Breast cancer in her mother.  ROS as above:  Medications: Current Outpatient Medications  Medication Sig Dispense Refill  . doxycycline (VIBRA-TABS) 100 MG tablet Take 1 tablet (100 mg total) by mouth 2 (two) times daily. 14 tablet 0  . HYDROcodone-acetaminophen (NORCO/VICODIN) 5-325 MG tablet Take 1 tablet by mouth every 8 (eight) hours as needed for moderate pain or severe pain. 10 tablet 0    . ondansetron (ZOFRAN-ODT) 8 MG disintegrating tablet Take 1 tablet (8 mg total) by mouth every 8 (eight) hours as needed for nausea. 20 tablet 3  . SYNTHROID 75 MCG tablet TAKE ONE (1) TABLET EACH DAY BEFORE BREAKFAST 30 tablet 3  . valACYclovir (VALTREX) 500 MG tablet Take 1 tablet (500 mg total) by mouth daily. 30 tablet 3   No current facility-administered medications for this visit.    Allergies  Allergen Reactions  . Streptomycin Other (See Comments)    Passed out  . Codeine   . Penicillins   . Sulfonamide Derivatives   . Benicar [Olmesartan] Other (See Comments)    Diizzy  . Hctz [Hydrochlorothiazide] Other (See Comments)    Vertigo, dizziness.   . Lisinopril Other (See Comments)    lightheaded    Health Maintenance Health Maintenance  Topic Date Due  . MAMMOGRAM  07/15/2018  . TETANUS/TDAP  08/15/2020  . INFLUENZA VACCINE  Completed  . DEXA SCAN  Completed  . PNA vac Low Risk Adult  Completed     Exam:  BP (!) 179/73   Pulse 75   Wt 116 lb (52.6 kg)   BMI 21.22 kg/m  Gen: Well NAD Left thumb healing wounds with no significant erythema or induration.  No fluctuance.  Minimally tender.  Decreased motion of the interphalangeal joint.  Capillary refill and sensation is intact distally.  Status:  Final result Visible to patient:  No (Not Released) Next appt:  08/05/2017 at 09:15 AM in Family Medicine Aundria Mems, MD) Dx:  Cellulitis and abscess of hand  Specimen Information: Wound     Component 5d ago  MICRO NUMBER: [id number]   SPECIMEN QUALITY: ADEQUATE   SOURCE: THUMB   STATUS: FINAL   GRAM STAIN: Rare White blood cells seen No epithelial cells seen Rare Gram positive cocci in pairs   ISOLATE 1: Staphylococcus aureus Abnormal    Resulting Agency [facility]  Susceptibility    Staphylococcus aureus    AEROBIC CULT, GRAM STAIN POSITIVE 1    CIPROFLOXACIN >=8  Resistant    CLINDAMYCIN <=0.25  Sensitive    ERYTHROMYCIN >=8  Resistant    GENTAMICIN  <=0.5  Sensitive    LEVOFLOXACIN 4  Resistant    OXACILLIN 0.5  Sensitive1    TETRACYCLINE <=1  Sensitive    TRIMETH/SULFA <=10  Sensitive2    VANCOMYCIN 1  Sensitive         1 Oxacillin-susceptible staphylococci are susceptible to other penicillinase-stable penicillins (e.g. Methicillin, Nafcillin), beta- lactam/beta-lactamase inhibitor combinations, and cephems with staphylococcal indications, including Cefazolin.            Assessment and Plan: 76 y.o. female with healing paronychia left thumb.  On the appropriate antibiotics now.  Plan for follow-up with PCP in about a month.  Return to work in 1 week.  Return sooner if needed.   No orders of the defined types were placed in this encounter.  No orders of the defined types were placed in this encounter.    Discussed warning signs or symptoms. Please see discharge instructions. Patient expresses understanding.   I spent 15 minutes with this patient, greater than 50% was face-to-face time counseling regarding ddx and treatment plan.

## 2017-07-24 ENCOUNTER — Telehealth: Payer: Self-pay

## 2017-07-24 ENCOUNTER — Encounter: Payer: Self-pay | Admitting: Family Medicine

## 2017-07-24 NOTE — Telephone Encounter (Signed)
Work note printed and will be ready for pick up today

## 2017-07-24 NOTE — Telephone Encounter (Signed)
Patient advised.

## 2017-07-24 NOTE — Telephone Encounter (Signed)
Kathleen [name] called and states she wants to have a work note to return on 07/28/17. Please advise.

## 2017-07-28 ENCOUNTER — Telehealth: Payer: Self-pay | Admitting: Osteopathic Medicine

## 2017-07-28 NOTE — Telephone Encounter (Signed)
Please call patient: FMLA forms were completed and left at front desk for her to pick up

## 2017-07-28 NOTE — Telephone Encounter (Signed)
I believe I filled out the paperwork such that if she had to miss any work for follow-up appointments, it would last her 4 hours of 27, a travel time, waiting time, going to pharmacy, etc. if needed. If she returns the paperwork to me, highlighting the area in question, I'm happy to clarify this.

## 2017-07-28 NOTE — Telephone Encounter (Signed)
Pt has been updated and informed completed form available for pick up at front desk.

## 2017-07-28 NOTE — Telephone Encounter (Signed)
Pt calls and states she picked up the paperwork for her to return back to work and states that Dr. Tommi Rumps said she could return to work with no restrictions and the paperwork she has that you filled out states with restrictions of 4 hours per day and she can not go back to work with any restrictions.  Would like a call very upset

## 2017-08-05 ENCOUNTER — Ambulatory Visit: Payer: 59 | Admitting: Sports Medicine

## 2017-08-29 ENCOUNTER — Other Ambulatory Visit: Payer: Self-pay | Admitting: Family Medicine

## 2017-11-17 DIAGNOSIS — L814 Other melanin hyperpigmentation: Secondary | ICD-10-CM | POA: Diagnosis not present

## 2017-11-17 DIAGNOSIS — L570 Actinic keratosis: Secondary | ICD-10-CM | POA: Diagnosis not present

## 2017-11-17 DIAGNOSIS — L820 Inflamed seborrheic keratosis: Secondary | ICD-10-CM | POA: Diagnosis not present

## 2017-11-17 DIAGNOSIS — D225 Melanocytic nevi of trunk: Secondary | ICD-10-CM | POA: Diagnosis not present

## 2017-11-17 DIAGNOSIS — L821 Other seborrheic keratosis: Secondary | ICD-10-CM | POA: Diagnosis not present

## 2017-11-17 DIAGNOSIS — L7211 Pilar cyst: Secondary | ICD-10-CM | POA: Diagnosis not present

## 2018-01-05 ENCOUNTER — Other Ambulatory Visit: Payer: Self-pay | Admitting: Family Medicine

## 2018-02-04 ENCOUNTER — Telehealth: Payer: Self-pay | Admitting: Family Medicine

## 2018-02-04 MED ORDER — SYNTHROID 75 MCG PO TABS: ORAL_TABLET | ORAL | 2 refills | Status: DC

## 2018-02-04 NOTE — Telephone Encounter (Signed)
Sent refill

## 2018-02-04 NOTE — Telephone Encounter (Signed)
Pt called. She is needing refill on Synthroid. She has an appointment scheduled for 7/19.

## 2018-02-05 ENCOUNTER — Encounter: Payer: Self-pay | Admitting: Family Medicine

## 2018-02-05 ENCOUNTER — Ambulatory Visit (INDEPENDENT_AMBULATORY_CARE_PROVIDER_SITE_OTHER): Payer: Medicare HMO | Admitting: Family Medicine

## 2018-02-05 VITALS — BP 128/78 | HR 76 | Ht 62.0 in | Wt 115.0 lb

## 2018-02-05 DIAGNOSIS — L089 Local infection of the skin and subcutaneous tissue, unspecified: Secondary | ICD-10-CM | POA: Diagnosis not present

## 2018-02-05 DIAGNOSIS — R7301 Impaired fasting glucose: Secondary | ICD-10-CM

## 2018-02-05 DIAGNOSIS — G4700 Insomnia, unspecified: Secondary | ICD-10-CM

## 2018-02-05 DIAGNOSIS — E039 Hypothyroidism, unspecified: Secondary | ICD-10-CM

## 2018-02-05 DIAGNOSIS — M2550 Pain in unspecified joint: Secondary | ICD-10-CM

## 2018-02-05 LAB — CBC WITH DIFFERENTIAL/PLATELET
BASOS PCT: 0.9 %
Basophils Absolute: 53 cells/uL (ref 0–200)
EOS PCT: 0.9 %
Eosinophils Absolute: 53 cells/uL (ref 15–500)
HCT: 41.3 % (ref 35.0–45.0)
Hemoglobin: 13.8 g/dL (ref 11.7–15.5)
Lymphs Abs: 1546 cells/uL (ref 850–3900)
MCH: 30.7 pg (ref 27.0–33.0)
MCHC: 33.4 g/dL (ref 32.0–36.0)
MCV: 91.8 fL (ref 80.0–100.0)
MONOS PCT: 10.6 %
MPV: 11.5 fL (ref 7.5–12.5)
Neutro Abs: 3623 cells/uL (ref 1500–7800)
Neutrophils Relative %: 61.4 %
Platelets: 229 10*3/uL (ref 140–400)
RBC: 4.5 10*6/uL (ref 3.80–5.10)
RDW: 12.7 % (ref 11.0–15.0)
TOTAL LYMPHOCYTE: 26.2 %
WBC: 5.9 10*3/uL (ref 3.8–10.8)
WBCMIX: 625 {cells}/uL (ref 200–950)

## 2018-02-05 LAB — BASIC METABOLIC PANEL WITH GFR
BUN: 21 mg/dL (ref 7–25)
CALCIUM: 9.6 mg/dL (ref 8.6–10.4)
CO2: 28 mmol/L (ref 20–32)
Chloride: 105 mmol/L (ref 98–110)
Creat: 0.73 mg/dL (ref 0.60–0.93)
GFR, Est African American: 91 mL/min/{1.73_m2} (ref >=60)
GFR, Est Non African American: 79 mL/min/{1.73_m2} (ref >=60)
GLUCOSE: 92 mg/dL (ref 65–139)
Potassium: 4.4 mmol/L (ref 3.5–5.3)
Sodium: 141 mmol/L (ref 135–146)

## 2018-02-05 LAB — TSH: TSH: 0.06 mIU/L — ABNORMAL LOW (ref 0.40–4.50)

## 2018-02-05 LAB — POCT GLYCOSYLATED HEMOGLOBIN (HGB A1C): Hemoglobin A1C: 5.7 % — AB (ref 4.0–5.6)

## 2018-02-05 NOTE — Progress Notes (Signed)
Subjective:    CC: 6 month f/u  HPI:  Impaired fasting glucose-no increased thirst or urination. No symptoms consistent with hypoglycemia.  Follow-up hypothyroidism-doing well overall with no significant changes in skin, hair, , or energy levels.  Taking medication regularly first thing in the morning separated from other medications and vitamins.  She has noticed some difficulty gaining weight and would like to gain just a little bit more.  She is also feeling a little bit of increase in nervousness and anxiety and some intermittent dizziness.  She also wanted to let me know that she did start some over-the-counter magnesium and chamomile tea at bedtime to help with sleep.  Her daughter's fianc passed away suddenly at age 61 and they are still awaiting the autopsy results.  This is been very distressing.  In regards to her arthritis she is actually been taking turmeric and so has been able to really reduce her NSAIDs and Tylenol usage.  She says it makes a big difference when she takes it twice a day and if she forgets to take it she notices an increase in her pain.  The request to have a CBC checked because of the infection that she had on her finger for so long.  Past medical history, Surgical history, Family history not pertinant except as noted below, Social history, Allergies, and medications have been entered into the medical record, reviewed, and corrections made.   Review of Systems: No fevers, chills, night sweats, weight loss, chest pain, or shortness of breath.   Objective:    General: Well Developed, well nourished, and in no acute distress.  Neuro: Alert and oriented x3, extra-ocular muscles intact, sensation grossly intact.  HEENT: Normocephalic, atraumatic  Skin: Warm and dry, no rashes. Cardiac: Regular rate and rhythm, no murmurs rubs or gallops, no lower extremity edema.  Respiratory: Clear to auscultation bilaterally. Not using accessory muscles, speaking in full  sentences.   Impression and Recommendations:    IFG - Stable. F/U in 6 months.  Continue to work on [employer] and staying active. Lab Results  Component Value Date   HGBA1C 5.7 (A) 02/05/2018    Hypothyroidism -it sounds like she could be having some symptoms from being mildly hyperthyroid so will recheck her TSH.  If her level is less than 1 like it was last fall then we will try to decrease her medication and readjust and recheck in 6 weeks.  Insomnia-okay to continue with magnesium and chamomile tea since it does seem to be helping.  Arthritis-I am actually very happy that she is been able to reduce her NSAID and Tylenol use and use turmeric.

## 2018-02-09 ENCOUNTER — Other Ambulatory Visit: Payer: Self-pay

## 2018-02-09 DIAGNOSIS — E039 Hypothyroidism, unspecified: Secondary | ICD-10-CM

## 2018-03-26 ENCOUNTER — Encounter: Payer: Self-pay | Admitting: Family Medicine

## 2018-03-26 ENCOUNTER — Ambulatory Visit (INDEPENDENT_AMBULATORY_CARE_PROVIDER_SITE_OTHER): Payer: Medicare HMO | Admitting: Family Medicine

## 2018-03-26 VITALS — BP 147/60 | HR 90 | Ht 62.0 in | Wt 118.0 lb

## 2018-03-26 DIAGNOSIS — M545 Low back pain, unspecified: Secondary | ICD-10-CM

## 2018-03-26 DIAGNOSIS — R319 Hematuria, unspecified: Secondary | ICD-10-CM

## 2018-03-26 DIAGNOSIS — R5383 Other fatigue: Secondary | ICD-10-CM

## 2018-03-26 DIAGNOSIS — E039 Hypothyroidism, unspecified: Secondary | ICD-10-CM | POA: Diagnosis not present

## 2018-03-26 DIAGNOSIS — R3911 Hesitancy of micturition: Secondary | ICD-10-CM | POA: Diagnosis not present

## 2018-03-26 LAB — POCT URINALYSIS DIPSTICK
BILIRUBIN UA: NEGATIVE
Glucose, UA: NEGATIVE
Ketones, UA: NEGATIVE
LEUKOCYTES UA: NEGATIVE
NITRITE UA: POSITIVE
PH UA: 5.5 (ref 5.0–8.0)
PROTEIN UA: NEGATIVE
Spec Grav, UA: >=1.03 — AB (ref 1.010–1.025)
UROBILINOGEN UA: 1 U/dL

## 2018-03-26 LAB — TSH: TSH: 0.11 m[IU]/L — AB (ref 0.40–4.50)

## 2018-03-26 MED ORDER — NITROFURANTOIN MONOHYD MACRO 100 MG PO CAPS: 100.0000 mg | ORAL_CAPSULE | Freq: Two times a day (BID) | ORAL | 0 refills | Status: DC

## 2018-03-26 NOTE — Patient Instructions (Signed)
Can try Premier Protein drink for breakfast if don't have time to eat your oatmeal.  Can try a protein bar as well.  Try to take a healthy snack to eat on your water take, like cheese sticks, nuts, popcorn, piece of fruit, couple yogurt, couple of peanut butter crackers or [place] butter crackers.

## 2018-03-26 NOTE — Progress Notes (Signed)
Subjective:    Patient ID: Kathleen [name], female    DOB: [birth year], 77 y.o.   MRN: [id number]  HPI 77 year old female comes in today complaining that she is had dark-colored urine for about the last 3 weeks.  Then over the weekend about 6 days ago she started experiencing some low back pain and some abdominal cramping.  The abdominal cramping is a little bit better but she continues to have the low back pain.  She says says it is a little more sore with certain movements.  She is been putting heat on it and it does seem to help.  Her pain is mostly over the mid back area and is bilateral.  She just feels extremely tired and at times a week.  She is also had some cold sweats.    She still works a lot of hours each week at [employer]  She admits she is been skipping breakfast often and a lot of times runs out of time to actually eat her lunch and only get a 30-minute meal break him of time she pick something up she only eats about half of it.  She is coming home feeling exhausted.  Hypothyroidism -she is taking her medication regularly but does feel more fatigued recently.   Review of Systems  BP (!) 147/60   Pulse 90   Ht 5\' 2"  (1.575 m)   Wt 118 lb (53.5 kg)   SpO2 100%   BMI 21.58 kg/m     Allergies  Allergen Reactions  . Streptomycin Other (See Comments)    Passed out  . Codeine   . Penicillins   . Sulfonamide Derivatives   . Benicar [Olmesartan] Other (See Comments)    Diizzy  . Hctz [Hydrochlorothiazide] Other (See Comments)    Vertigo, dizziness.   . Lisinopril Other (See Comments)    lightheaded    Past Medical History:  Diagnosis Date  . Cancer ([place])    Hx RT breast infiltrated ductal CA previously on tamoxifen and armidex   . Herpes simplex    lt eye  . Hypertension   . Kidney stones    history  . MVP (mitral valve prolapse)    asymptomatic  . Personal history of chemotherapy   . Personal history of radiation therapy     Past Surgical History:   Procedure Laterality Date  . ABDOMINAL HYSTERECTOMY     partial  . BREAST BIOPSY    . BREAST LUMPECTOMY Right 2000   RT  . CERVICAL LAMINECTOMY  1986  . SHOULDER SURGERY     rotator cuff repair  . throidectomy  1988   For cancer     Social History   Socioeconomic History  . Marital status: Married    Spouse name: Not on file  . Number of children: Not on file  . Years of education: Not on file  . Highest education level: Not on file  Occupational History  . Not on file  Social Needs  . Financial resource strain: Not on file  . Food insecurity:    Worry: Not on file    Inability: Not on file  . Transportation needs:    Medical: Not on file    Non-medical: Not on file  Tobacco Use  . Smoking status: Never Smoker  . Smokeless tobacco: Never Used  Substance and Sexual Activity  . Alcohol use: No  . Drug use: Not on file  . 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  Lifestyle  . Physical activity:    Days per week: Not on file    Minutes per session: Not on file  . Stress: Not on file  Relationships  . Social connections:    Talks on phone: Not on file    Gets together: Not on file    Attends religious service: Not on file    Active member of club or organization: Not on file    Attends meetings of clubs or organizations: Not on file    Relationship status: Not on file  . Intimate partner violence:    Fear of current or ex partner: Not on file    Emotionally abused: Not on file    Physically abused: Not on file    Forced sexual activity: Not on file  Other Topics Concern  . Not on file  Social History Narrative  . Not on file    Family History  Problem Relation Age of Onset  . Breast cancer Mother     Outpatient Encounter Medications as of 03/26/2018  Medication Sig  . magnesium 30 MG tablet Take 30 mg by mouth 2 (two) times daily.  Marland Kitchen  SYNTHROID 75 MCG tablet TAKE ONE (1) TABLET BY MOUTH EACH DAY BEFORE BREAKFAST  . TURMERIC PO Take by mouth.  . valACYclovir (VALTREX) 500 MG tablet TAKE ONE (1) TABLET BY MOUTH EVERY DAY  . nitrofurantoin, macrocrystal-monohydrate, (MACROBID) 100 MG capsule Take 1 capsule (100 mg total) by mouth 2 (two) times daily.   No facility-administered encounter medications on file as of 03/26/2018.          Objective:   Physical Exam  Constitutional: She is oriented to person, place, and time. She appears well-developed and well-nourished.  HENT:  Head: Normocephalic and atraumatic.  Cardiovascular: Normal rate, regular rhythm and normal heart sounds.  Pulmonary/Chest: Effort normal and breath sounds normal.  Abdominal: Soft. Bowel sounds are normal. She exhibits no distension and no mass. There is no tenderness. There is no rebound and no guarding. No hernia.  Musculoskeletal:  Normal flexion, extension, Normal ROM with side bending.  Tender over the thoracic or lumbar spine.  No CVA tenderness.  Neurological: She is alert and oriented to person, place, and time.  Skin: Skin is warm and dry.  Psychiatric: She has a normal mood and affect. Her behavior is normal.       Assessment & Plan:  UTI - UA pos for blood and leuks. Will treat with nitrofurantoin.  Recheck ua in one week to check for resolution of the blood.   Hemateria-follow-up in 1 week for recheck of urine.  Fatigue -the importance of eating breakfast every day and discussed some strategies around quick snacks on her water break and making sure she is getting some form of breakfast and even if it just a protein drink.  I would like to see her back in a week and make sure she is feeling better.  Hypothyroidism-she is overdue to recheck her TSH.  Lab slip reprinted today.

## 2018-03-28 LAB — URINE CULTURE
MICRO NUMBER: 91069842
SPECIMEN QUALITY: ADEQUATE

## 2018-04-01 ENCOUNTER — Encounter: Payer: Self-pay | Admitting: Family Medicine

## 2018-04-01 ENCOUNTER — Ambulatory Visit (INDEPENDENT_AMBULATORY_CARE_PROVIDER_SITE_OTHER): Payer: Medicare HMO | Admitting: Family Medicine

## 2018-04-01 VITALS — BP 146/80 | HR 58 | Ht 62.0 in | Wt 118.0 lb

## 2018-04-01 DIAGNOSIS — R3911 Hesitancy of micturition: Secondary | ICD-10-CM | POA: Diagnosis not present

## 2018-04-01 DIAGNOSIS — R319 Hematuria, unspecified: Secondary | ICD-10-CM | POA: Diagnosis not present

## 2018-04-01 DIAGNOSIS — R110 Nausea: Secondary | ICD-10-CM | POA: Diagnosis not present

## 2018-04-01 LAB — POCT URINALYSIS DIPSTICK
Bilirubin, UA: NEGATIVE
Glucose, UA: NEGATIVE
Ketones, UA: NEGATIVE
Leukocytes, UA: NEGATIVE
NITRITE UA: NEGATIVE
PH UA: 6 (ref 5.0–8.0)
PROTEIN UA: NEGATIVE
SPEC GRAV UA: 1.01 (ref 1.010–1.025)
UROBILINOGEN UA: 0.2 U/dL

## 2018-04-01 NOTE — Progress Notes (Signed)
   Subjective:    Patient ID: Kathleen [name], female    DOB: October 03, 1940, 77 y.o.   MRN: [id number]  HPI 77 year old female comes in today to follow-up for recent urinary tract infection.  Urine culture was positive for Klebsiella.  She still has 1 more tab of the nitrofurantoin to complete today and she will finish that at lunch today.  Overall she is feeling much better just not completely back to herself.  The abdominal cramping has completely resolved.  The dizziness and brain fogginess have resolved as well.  She still having some low back pain though it is better than it was.  And she still getting some intermittent nausea.  In fact the last several nights she is been waking up around 1 AM and a little nauseated but then it eventually subsides and she is able to go back to sleep.  She says she is trying to hydrate and trying to get a few more snacks [place] and not skipping meals at work.  She is back at work in fact she work this morning and then drove here for appointment.  She did notice some darker urine earlier this week but that seems to have resolved.  She does have chronic hematuria.   Review of Systems     Objective:   Physical Exam  Constitutional: She is oriented to person, place, and time. She appears well-developed and well-nourished.  HENT:  Head: Normocephalic and atraumatic.  Cardiovascular: Normal rate, regular rhythm and normal heart sounds.  Pulmonary/Chest: Effort normal and breath sounds normal.  Abdominal: Soft. Bowel sounds are normal. She exhibits no distension and no mass. There is tenderness. There is no rebound and no guarding. No hernia.  Mild generalized tenderness  Neurological: She is alert and oriented to person, place, and time.  Skin: Skin is warm and dry.  Psychiatric: She has a normal mood and affect. Her behavior is normal.        Assessment & Plan:  Recent UTI-urinalysis is negative today except for small amount of blood.  Will send for microscopic  review.  She does have some chronic micro-scopic hematuria so as long as there is not a large amount of red blood cells on the micro then this is probably her baseline.  Overall she is feeling significantly better just not completely back to herself and still getting some nausea.  She could actually be experiencing some nausea for the nitrofurantoin so I did encourage her to finish out her last tab this morning and then see if she continues to improve over the next couple of days.  Nausea-likely medication side effect at this point but if it is not improving in the next couple days since she is finishing up her antibiotics today then please let us know.  She may also want to try Zantac or Prilosec at bedtime since the nausea is mostly hitting her in the middle the night.  microscopic hematuria-mostly chronic.  See note above.  Discussed need for flu vaccine.  Should get high dose.  Currently out of stock in our office to work in a place her on the list for high dose.

## 2018-04-02 LAB — URINALYSIS, MICROSCOPIC ONLY
BACTERIA UA: NONE SEEN /HPF
Hyaline Cast: NONE SEEN /LPF
RBC / HPF: NONE SEEN /HPF (ref 0–2)
Squamous Epithelial / LPF: NONE SEEN /HPF (ref <=5)
WBC, UA: NONE SEEN /HPF (ref 0–5)

## 2018-05-14 ENCOUNTER — Other Ambulatory Visit: Payer: Self-pay | Admitting: Family Medicine

## 2018-05-14 DIAGNOSIS — Z1231 Encounter for screening mammogram for malignant neoplasm of breast: Secondary | ICD-10-CM

## 2018-05-18 DIAGNOSIS — L570 Actinic keratosis: Secondary | ICD-10-CM | POA: Diagnosis not present

## 2018-05-18 DIAGNOSIS — C44519 Basal cell carcinoma of skin of other part of trunk: Secondary | ICD-10-CM | POA: Diagnosis not present

## 2018-05-18 DIAGNOSIS — L821 Other seborrheic keratosis: Secondary | ICD-10-CM | POA: Diagnosis not present

## 2018-05-18 DIAGNOSIS — D1801 Hemangioma of skin and subcutaneous tissue: Secondary | ICD-10-CM | POA: Diagnosis not present

## 2018-05-18 DIAGNOSIS — L439 Lichen planus, unspecified: Secondary | ICD-10-CM | POA: Diagnosis not present

## 2018-05-18 DIAGNOSIS — Z85828 Personal history of other malignant neoplasm of skin: Secondary | ICD-10-CM | POA: Diagnosis not present

## 2018-05-18 DIAGNOSIS — D485 Neoplasm of uncertain behavior of skin: Secondary | ICD-10-CM | POA: Diagnosis not present

## 2018-05-31 ENCOUNTER — Other Ambulatory Visit: Payer: Self-pay | Admitting: Family Medicine

## 2018-06-25 ENCOUNTER — Telehealth: Payer: Self-pay

## 2018-06-25 NOTE — Telephone Encounter (Signed)
Kathleen [name] called today stating she had extreme weakness and trouble with speech last night after work. I advised her she needed to be evaluated at the ED for stroke. She states she did not want to go back to the ED because of past problems with the ED. I offered her an appointment with Dr Dianah Field today. She refused appointment because she has to go to work today. I advised that this was more important. She stated she will call back later to schedule if she could.

## 2018-06-25 NOTE — Telephone Encounter (Signed)
Agree with plan to be evaluated for stroke.

## 2018-06-27 DIAGNOSIS — R531 Weakness: Secondary | ICD-10-CM | POA: Diagnosis not present

## 2018-06-27 DIAGNOSIS — R2689 Other abnormalities of gait and mobility: Secondary | ICD-10-CM | POA: Diagnosis not present

## 2018-06-27 DIAGNOSIS — R29818 Other symptoms and signs involving the nervous system: Secondary | ICD-10-CM | POA: Diagnosis not present

## 2018-06-27 DIAGNOSIS — E785 Hyperlipidemia, unspecified: Secondary | ICD-10-CM | POA: Diagnosis not present

## 2018-06-27 DIAGNOSIS — I639 Cerebral infarction, unspecified: Secondary | ICD-10-CM | POA: Diagnosis not present

## 2018-06-27 DIAGNOSIS — I10 Essential (primary) hypertension: Secondary | ICD-10-CM | POA: Diagnosis not present

## 2018-06-27 DIAGNOSIS — I6523 Occlusion and stenosis of bilateral carotid arteries: Secondary | ICD-10-CM | POA: Diagnosis not present

## 2018-06-27 DIAGNOSIS — I081 Rheumatic disorders of both mitral and tricuspid valves: Secondary | ICD-10-CM | POA: Diagnosis not present

## 2018-06-27 DIAGNOSIS — G8194 Hemiplegia, unspecified affecting left nondominant side: Secondary | ICD-10-CM | POA: Diagnosis not present

## 2018-06-27 DIAGNOSIS — Z90710 Acquired absence of both cervix and uterus: Secondary | ICD-10-CM | POA: Diagnosis not present

## 2018-06-27 DIAGNOSIS — G459 Transient cerebral ischemic attack, unspecified: Secondary | ICD-10-CM | POA: Diagnosis not present

## 2018-06-27 DIAGNOSIS — M419 Scoliosis, unspecified: Secondary | ICD-10-CM | POA: Diagnosis not present

## 2018-06-27 DIAGNOSIS — E039 Hypothyroidism, unspecified: Secondary | ICD-10-CM | POA: Diagnosis not present

## 2018-06-27 DIAGNOSIS — R4781 Slurred speech: Secondary | ICD-10-CM | POA: Diagnosis not present

## 2018-06-27 DIAGNOSIS — I341 Nonrheumatic mitral (valve) prolapse: Secondary | ICD-10-CM | POA: Diagnosis not present

## 2018-06-29 MED ORDER — LEVOTHYROXINE SODIUM 75 MCG PO TABS
75.00 | ORAL_TABLET | ORAL | Status: DC
Start: 2018-06-30 — End: 2018-06-29

## 2018-06-29 MED ORDER — ASPIRIN EC 81 MG PO TBEC
81.00 | DELAYED_RELEASE_TABLET | ORAL | Status: DC
Start: ? — End: 2018-06-29

## 2018-06-29 MED ORDER — CLOPIDOGREL BISULFATE 75 MG PO TABS
75.00 | ORAL_TABLET | ORAL | Status: DC
Start: 2018-06-30 — End: 2018-06-29

## 2018-06-29 MED ORDER — VALACYCLOVIR HCL 500 MG PO TABS
500.00 | ORAL_TABLET | ORAL | Status: DC
Start: 2018-06-30 — End: 2018-06-29

## 2018-06-29 MED ORDER — ATORVASTATIN CALCIUM 40 MG PO TABS
80.00 | ORAL_TABLET | ORAL | Status: DC
Start: 2018-06-29 — End: 2018-06-29

## 2018-06-29 MED ORDER — AMLODIPINE BESYLATE 5 MG PO TABS
10.00 | ORAL_TABLET | ORAL | Status: DC
Start: 2018-06-30 — End: 2018-06-29

## 2018-07-01 ENCOUNTER — Other Ambulatory Visit: Payer: Self-pay

## 2018-07-01 NOTE — Patient Outreach (Signed)
[facility]) Care Management  07/01/2018  Kathleen [name] 25-Dec-1940 [id number]   Referral Date: 07/01/18 Referral Source: [employer] Report Date of Admission: 06-27-18 Diagnosis: stroke Date of Discharge: 06/29/18 Facility:  [facility] Insurance: [facility]  Outreach attempt: spoke with patient.  She reports she is doing good.  She states she is ordered outpatient therapy and at this time she working to have all evaluations done on the same day to save money.  She states she has been able to provide self care independently but has her husband in the home for assistance.  She states she is able to take her medications as prescribed and able to review medications. She has not made her follow up appointments yet but will be once she gets her evaluations done by therapy.  She declines needing assistance with setting up appointments.  Discussed with patient importance of follow up appointments.  Patient states she works full time and wants to go back to work but waiting on therapy input.  Patient declines any needs at this time.     Plan: RN CM will close case.     Jone Baseman, RN, MSN [facility] Care Management Care Management Coordinator Direct Line [phone] Toll Free: [phone]  Fax: [phone]

## 2018-07-02 DIAGNOSIS — I639 Cerebral infarction, unspecified: Secondary | ICD-10-CM | POA: Diagnosis not present

## 2018-07-02 DIAGNOSIS — R278 Other lack of coordination: Secondary | ICD-10-CM | POA: Diagnosis not present

## 2018-07-02 DIAGNOSIS — R531 Weakness: Secondary | ICD-10-CM | POA: Diagnosis not present

## 2018-07-06 ENCOUNTER — Telehealth: Payer: Self-pay | Admitting: Family Medicine

## 2018-07-06 NOTE — Telephone Encounter (Signed)
Patient calling in stating that she needs to be seen for a hospital follow up as soon as possible, before the end of the year. No open slots. Patient was seen at [facility] in [place] from December 8th-11th for a stroke. Please advise.

## 2018-07-06 NOTE — Telephone Encounter (Signed)
Routing to PCP for review and recommendation.

## 2018-07-07 ENCOUNTER — Encounter: Payer: Self-pay | Admitting: Family Medicine

## 2018-07-07 ENCOUNTER — Ambulatory Visit (INDEPENDENT_AMBULATORY_CARE_PROVIDER_SITE_OTHER): Payer: Medicare HMO | Admitting: Family Medicine

## 2018-07-07 VITALS — BP 136/68 | HR 82 | Ht 62.0 in | Wt 116.0 lb

## 2018-07-07 DIAGNOSIS — S41112A Laceration without foreign body of left upper arm, initial encounter: Secondary | ICD-10-CM

## 2018-07-07 DIAGNOSIS — Z8673 Personal history of transient ischemic attack (TIA), and cerebral infarction without residual deficits: Secondary | ICD-10-CM | POA: Diagnosis not present

## 2018-07-07 DIAGNOSIS — E785 Hyperlipidemia, unspecified: Secondary | ICD-10-CM

## 2018-07-07 DIAGNOSIS — I10 Essential (primary) hypertension: Secondary | ICD-10-CM | POA: Diagnosis not present

## 2018-07-07 MED ORDER — METOPROLOL SUCCINATE ER 25 MG PO TB24: 25.0000 mg | ORAL_TABLET | Freq: Every day | ORAL | 2 refills | Status: DC

## 2018-07-07 NOTE — Progress Notes (Addendum)
Subjective:    CC:   HPI: 77 yo female is here today for hospital f/u for a stroke.  She was actually admitted to [facility] on December 8 for left-sided weakness and slurred speech.  She was diagnosed with acute stroke affecting the right basal ganglia and external capsule.  She was placed on aspirin 81 mg and Plavix 75 mg.  She was also started on a statin for her hyperlipidemia as well as amlodipine 5 mg for hypertension. She has been tolerating meds well w/o side effects.   She has started PT,OT and ST but is concerned about the $40 copay to pay each one.  She feels like she drools if she speaks too quickly but doesn't have any problems with slurring her speech or word finding.    She feels like she still has some clumsiness with her left hand in particular.  She denies any pain or weakness but just feels like sometimes when she is getting ready to grab something she will feel like there is something in her hand but she has not actually touched it yet.  She is also still complaining of a little bit of weakness in that left leg that she feels throws her balance off.  She has fallen once since being home and hit her left side.  She has had had some bruising over her left knee and left elbow because of the fall.  She actually cut her left elbow.  She has been keeping the wound clean and applying Vaseline.  She just wants me to take a look at it.   Lab Results  Component Value Date   CHOL 200 (H) 04/22/2017   HDL 61 04/22/2017   LDLCALC 123 (H) 04/22/2017   TRIG 70 04/22/2017   CHOLHDL 3.3 04/22/2017    Past medical history, Surgical history, Family history not pertinant except as noted below, Social history, Allergies, and medications have been entered into the medical record, reviewed, and corrections made.   Review of Systems: No fevers, chills, night sweats, weight loss, chest pain, or shortness of breath.   Objective:    General: Well Developed, well nourished, and in no acute  distress.  Neuro: Alert and oriented x3, extra-ocular muscles intact, sensation grossly intact.  HEENT: Normocephalic, atraumati, she seems to have no speech difficulty whatsoever on exam today and is able to communicate very clearly. Skin: Warm and dry, no rashes.  Does have a linear approximately 3 inch laceration over her left elbow.  The wound looks clean dry and intact and the edges of the wound are well approximated. Cardiac: Regular rate and rhythm, no murmurs rubs or gallops, no lower extremity edema.  Respiratory: Clear to auscultation bilaterally. Not using accessory muscles, speaking in full sentences. MSK: Strength is 5 out of 5 in the shoulders elbows and wrists.  There is 5 out of 5 hips knees and ankles as well.  Normal range of motion bilaterally of both upper and lower extremities.   Impression and Recommendations:   History of stroke-as of January 10 she will be able to discontinue the Plavix and then just increase her aspirin to 325 mg daily.  She is only able to tolerate 5 mg of amlodipine thus far.    Hypertension -still not well controlled today. She is only able to tolerate 5 mg of amlodipine thus far.  She is not tolerated diuretics well in the past.  She is also had lightheadedness and dizziness with ACE inhibitor's and ARBs in the  past.  Sitter adding a low-dose beta-blocker to her calcium channel blocker.  We will need to monitor pulse.  We can even start with a half a tab.  Her blood pressure was under 140 but optimistically with her stroke history should be under 130 but I do want to be cautious also because of her age at 89.  Drop her pressure too quickly or she may feel dizzy and lightheaded.  Urged her to hydrate well.  Hyperlipidemia -well with statin thus far.  Plan to recheck lipids and liver enzymes in about 4 weeks.  See her back in about 2 weeks and we can go ahead and place the order at that time.  Abnormal sensation in that left hand.  Her strength is actually  really good on exam.  Continue with physical therapy to help with gait and balance unsteadiness.  I really do not think she will need much OT or speech therapy but did encourage her to at least have one more visit with each of them and determine whether or not she needs to continue those.  Because of cost I think the physical therapy is where she would benefit the most and encouraged her to at least commit to that.  Left elbow laceration-the wound actually looks clean dry and intact.  Continue with applying Vaseline and loose gauze.  Call if at any point any concerns about wound infection.  Spent 40 minutes, greater than 50% time spent face-to-face addressing the stroke, hypertension, hyperlipidemia, paresthesia and abnormal sensation, weakness, left elbow laceration.

## 2018-07-08 ENCOUNTER — Telehealth: Payer: Self-pay | Admitting: *Deleted

## 2018-07-08 ENCOUNTER — Inpatient Hospital Stay: Payer: Medicare HMO | Admitting: Family Medicine

## 2018-07-08 NOTE — Telephone Encounter (Signed)
lvm informing pt that forms have been faxed and if she would like for [place] to send her portion of the forms she can drop those off and we can fax that for her.Elouise Munroe, [place]

## 2018-07-08 NOTE — Telephone Encounter (Signed)
Form completed,faxed,confirmation received and scanned into patient's chart..Kathleen [name], CMA  

## 2018-07-09 DIAGNOSIS — R531 Weakness: Secondary | ICD-10-CM | POA: Diagnosis not present

## 2018-07-09 DIAGNOSIS — I639 Cerebral infarction, unspecified: Secondary | ICD-10-CM | POA: Diagnosis not present

## 2018-07-09 DIAGNOSIS — R278 Other lack of coordination: Secondary | ICD-10-CM | POA: Diagnosis not present

## 2018-07-09 DIAGNOSIS — R41841 Cognitive communication deficit: Secondary | ICD-10-CM | POA: Diagnosis not present

## 2018-07-09 NOTE — Addendum Note (Signed)
Addended by: Beatrice Lecher D on: 07/09/2018 12:14 PM   Modules accepted: Level of Service

## 2018-07-12 ENCOUNTER — Telehealth: Payer: Self-pay | Admitting: Family Medicine

## 2018-07-12 DIAGNOSIS — I639 Cerebral infarction, unspecified: Secondary | ICD-10-CM | POA: Diagnosis not present

## 2018-07-12 DIAGNOSIS — R531 Weakness: Secondary | ICD-10-CM | POA: Diagnosis not present

## 2018-07-12 DIAGNOSIS — R278 Other lack of coordination: Secondary | ICD-10-CM | POA: Diagnosis not present

## 2018-07-12 NOTE — Telephone Encounter (Signed)
PCP will need to address. Routing.

## 2018-07-12 NOTE — Telephone Encounter (Signed)
Spoke with Pt. She will restart Plavix, ASA, and the statin. Will hold amlodipine. She was scheduled at her original call for this Friday with PCP for eval, will keep this appt. Advised to make sure she is staying well hydrated in the meantime, verbalized understanding.

## 2018-07-12 NOTE — Telephone Encounter (Signed)
Absolutely needs to restart her Plavix and aspirin and statin.  She can try holding the amlodipine since that does lower blood pressure and can cause dizziness to see if that may be the culprit but it is completely unsafe and could risk her having another more severe stroke if she discontinues everything.

## 2018-07-12 NOTE — Telephone Encounter (Signed)
Patient rescheduled her follow up appointment from 07/28/2018 to 07/16/18. Patient was seen 07/07/18 and wanted to know if it was too early for her to come back to be seen. States that the medication that was prescribed from [facility] is causing dizziness, nauseous and she couldn't function. Patient has stopped taking amLODipine (NORVASC) 5 MG tablet [[id number]], aspirin EC 81 MG tablet [[id number]], atorvastatin (LIPITOR) 80 MG tablet [[id number]] and clopidogrel (PLAVIX) 75 MG tablet [[id number]]. It has been two days. Please contact and advise.

## 2018-07-16 ENCOUNTER — Ambulatory Visit (INDEPENDENT_AMBULATORY_CARE_PROVIDER_SITE_OTHER): Payer: Medicare HMO | Admitting: Family Medicine

## 2018-07-16 ENCOUNTER — Ambulatory Visit
Admission: RE | Admit: 2018-07-16 | Discharge: 2018-07-16 | Disposition: A | Discharge status: Home / Self Care | Payer: Medicare HMO | Source: Ambulatory Visit | Attending: Family Medicine | Admitting: Family Medicine

## 2018-07-16 ENCOUNTER — Encounter: Payer: Self-pay | Admitting: Family Medicine

## 2018-07-16 VITALS — BP 132/55 | HR 70 | Ht 62.0 in

## 2018-07-16 DIAGNOSIS — I10 Essential (primary) hypertension: Secondary | ICD-10-CM

## 2018-07-16 DIAGNOSIS — E785 Hyperlipidemia, unspecified: Secondary | ICD-10-CM | POA: Diagnosis not present

## 2018-07-16 DIAGNOSIS — Z8673 Personal history of transient ischemic attack (TIA), and cerebral infarction without residual deficits: Secondary | ICD-10-CM | POA: Diagnosis not present

## 2018-07-16 DIAGNOSIS — Z1231 Encounter for screening mammogram for malignant neoplasm of breast: Secondary | ICD-10-CM | POA: Diagnosis not present

## 2018-07-16 NOTE — Progress Notes (Signed)
Subjective:    CC: F/U stroke  HPI:  She was admitted to [facility] on December 8 for left-sided weakness and slurred speech.  She was diagnosed with acute stroke affecting the right basal ganglia and external capsule.  She was placed on aspirin 81 mg and Plavix 75 mg.  She was also started on a statin for her hyperlipidemia as well as amlodipine 5 mg for hypertension.  Actually seen her for a hospital follow-up about 2 weeks ago.  Since then she had actually called our office and said that she was feeling dizzy and had actually stopped her aspirin Plavix statin and amlodipine.  I strongly encouraged her to restart her medication minus the amlodipine as that was more likely to be the culprit for the dizziness.  Though she does have high blood pressure so may need to look at other options as she has been intolerant to other medications in the past. She has felt better since then but is still struggling with weakness.  Hyperlipidemia-tolerating statin well so far.  She says she just feels tired.  Past medical history, Surgical history, Family history not pertinant except as noted below, Social history, Allergies, and medications have been entered into the medical record, reviewed, and corrections made.   Review of Systems: No fevers, chills, night sweats, weight loss, chest pain, or shortness of breath.   Objective:    General: Well Developed, well nourished, and in no acute distress.  Neuro: Alert and oriented x3, extra-ocular muscles intact, sensation grossly intact.  HEENT: Normocephalic, atraumatic  Skin: Warm and dry, no rashes. Cardiac: Regular rate and rhythm, no murmurs rubs or gallops, no lower extremity edema.  Respiratory: Clear to auscultation bilaterally. Not using accessory muscles, speaking in full sentences.   Impression and Recommendations:    Hx of recent right basal ganglion stroke -she is been doing better and getting a little stronger overall.  Still could you  continuing to use physical therapy.  She does use her walker at times when is she is feeling a little bit more weak.  She will be able to come off the Plavix around January 10 so hopefully when I see her back in about 2 weeks she will be finishing that up and switching to higher dose aspirin.  Continue with atorvastatin 80 mg daily.  Hypertension -right now she is off of all blood pressure medication.  Her repeat blood pressure did look better though her diastolics a little low so just encouraged her to really work hard on pushing her fluid intake.  Off on picking up the metoprolol she never actually got that from the pharmacy continue to keep the amlodipine though she is can hold it.  I really want her to get a new blood pressure cuff was we got a 30 point difference between her home cuff and our cuff here in the office so I do not think it is accurate.  Though her blood pressures on the memory function of her blood pressure cuff ranged from 95/60 up to 151/87 with most of them in the 120s and 130s.  I think we still need to tease apart whether or not her blood pressure is well regulated, high, or actually low which could be contributing to some the dizziness she is been experiencing.  She also has not been eating and drinking a lot which could also contribute to the dizziness.  Hyperlipidemia-continue statin.  F/U in 2 weeks.

## 2018-07-16 NOTE — Patient Instructions (Addendum)
Get your new BP cuff and track BP once a day.   Work on increasing fluid intake to at least 60 ounces a day.   OK to hold the amlodpine and don't fill the metoprolol

## 2018-07-19 DIAGNOSIS — R278 Other lack of coordination: Secondary | ICD-10-CM | POA: Diagnosis not present

## 2018-07-19 DIAGNOSIS — I639 Cerebral infarction, unspecified: Secondary | ICD-10-CM | POA: Diagnosis not present

## 2018-07-19 DIAGNOSIS — R531 Weakness: Secondary | ICD-10-CM | POA: Diagnosis not present

## 2018-07-28 ENCOUNTER — Ambulatory Visit: Payer: Medicare HMO | Admitting: Family Medicine

## 2018-07-30 ENCOUNTER — Ambulatory Visit (INDEPENDENT_AMBULATORY_CARE_PROVIDER_SITE_OTHER): Payer: Medicare HMO | Admitting: Family Medicine

## 2018-07-30 VITALS — BP 143/80 | HR 78 | Temp 97.4°F | Wt 118.0 lb

## 2018-07-30 DIAGNOSIS — I10 Essential (primary) hypertension: Secondary | ICD-10-CM

## 2018-07-30 NOTE — Progress Notes (Signed)
Agree with documentation as above.  Blood pressures are well controlled and it seems like her home cuff is actually pretty accurate.  We will continue to monitor carefully at home hopefully she can bring in her pressures with her at next visit.  Beatrice Lecher, MD

## 2018-07-30 NOTE — Progress Notes (Signed)
Pt in today for BP check, and also to check her BP machine against ours. Pt states she has white coat syndrome, so I let her rest before taking BP.Her first BP was 164/104 on our machine and 166/99.Rechecked 10 minutes later BP was 148/88 and her machine 143/82. Recalled patients memory on her machine and it is consistently lower at home, 1003/53,115/62,115/54,111/90 and 121/62. Pt is currently nottaking any BP medicine. Pt also wanting to return to work. Per provider pt to continue her current meds and to keep follow up appt. To discuss returning to work.

## 2018-08-03 ENCOUNTER — Telehealth: Payer: Self-pay

## 2018-08-03 NOTE — Telephone Encounter (Signed)
Okay to send.  Continue current dose.  She will need to recheck a TSH in 6 to 8 weeks.

## 2018-08-03 NOTE — Telephone Encounter (Signed)
Wyatt would like to switch to Levothyroxine instead of the Synthroid due to the cost of the medication. Please advise.

## 2018-08-05 MED ORDER — LEVOTHYROXINE SODIUM 75 MCG PO TABS: 75.0000 ug | ORAL_TABLET | Freq: Every day | ORAL | 1 refills | Status: DC

## 2018-08-05 NOTE — Telephone Encounter (Signed)
Medication sent. Patient advised of recommendations.

## 2018-08-12 ENCOUNTER — Encounter: Payer: Self-pay | Admitting: Family Medicine

## 2018-08-12 ENCOUNTER — Ambulatory Visit (INDEPENDENT_AMBULATORY_CARE_PROVIDER_SITE_OTHER): Payer: Medicare HMO | Admitting: Family Medicine

## 2018-08-12 VITALS — BP 146/58 | HR 75 | Ht 62.0 in | Wt 117.0 lb

## 2018-08-12 DIAGNOSIS — K5900 Constipation, unspecified: Secondary | ICD-10-CM

## 2018-08-12 DIAGNOSIS — I10 Essential (primary) hypertension: Secondary | ICD-10-CM

## 2018-08-12 DIAGNOSIS — Z8673 Personal history of transient ischemic attack (TIA), and cerebral infarction without residual deficits: Secondary | ICD-10-CM | POA: Diagnosis not present

## 2018-08-12 MED ORDER — VALACYCLOVIR HCL 500 MG PO TABS: ORAL_TABLET | ORAL | 6 refills | Status: DC

## 2018-08-12 NOTE — Patient Instructions (Addendum)
Recommend Miralax - mixed with 6-8 ounce of fluids twice a day until you have a loose bowel movement.

## 2018-08-12 NOTE — Progress Notes (Signed)
Subjective:    CC: F/U stroke.   HPI: 78 year old female is here today to follow-up for recent stroke.  She wanted to discuss returning to work.  Overall she is getting a lot stronger she is been able to do chores around the house.  She has gone to a couple of PT sessions but says the last time she went it caused some significant bruising on her right leg so she did not go to her appointment yesterday but has been rescheduled for next week.  She still noticing a little bit of weakness when she tries to smile or if she is talking a lot and gets nervous on the left side of her face.  She would really like to resume going to work in February.  Also complains that over the last couple of weeks she has had constipation.  She has been trying to drink carrot juice as a friend told her that it would help.  She is also tried a suppository.  She says she spent hours on the toilet on Sunday trying to get stool to move she was quite uncomfortable and felt sick by the end of the day.  She said she finally took a Senokot.  Pretension-she is still taking the metoprolol and doing well.  She did not tolerate the higher doses and in fact had some low blood pressures at home.  Past medical history, Surgical history, Family history not pertinant except as noted below, Social history, Allergies, and medications have been entered into the medical record, reviewed, and corrections made.   Review of Systems: No fevers, chills, night sweats, weight loss, chest pain, or shortness of breath.   Objective:    General: Well Developed, well nourished, and in no acute distress.  Neuro: Alert and oriented x3, extra-ocular muscles intact, sensation grossly intact.  HEENT: Normocephalic, atraumatic  Skin: Warm and dry, no rashes. Cardiac: Regular rate and rhythm, no murmurs rubs or gallops, no lower extremity edema.  Respiratory: Clear to auscultation bilaterally. Not using accessory muscles, speaking in full  sentences.   Impression and Recommendations:    History of recent stroke-she really has made great strides and is doing much better.  She is getting her strength back and able to do her normal daily activities.  Plan will be for her to return to work on February 10 part-time for 4 hours 3 days/week.  If after a couple of weeks she is doing well then we can see if at that point we can extend her time or release her back to full-time.  Discussed that she would need to do things like avoiding heavy lifting pulling and pushing and trying to avoid climbing tall ladders.  Stepstools would be fine.  Constipation-recommend a trial of MiraLAX twice a day until she has a soft liquidy bowel movement.  Hypertension-blood pressure borderline today but again reports good home blood pressures.

## 2018-08-13 ENCOUNTER — Telehealth: Payer: Self-pay

## 2018-08-13 ENCOUNTER — Other Ambulatory Visit: Payer: Self-pay | Admitting: Family Medicine

## 2018-08-13 DIAGNOSIS — Z8673 Personal history of transient ischemic attack (TIA), and cerebral infarction without residual deficits: Secondary | ICD-10-CM

## 2018-08-13 DIAGNOSIS — R29898 Other symptoms and signs involving the musculoskeletal system: Secondary | ICD-10-CM

## 2018-08-13 NOTE — Telephone Encounter (Signed)
I placed new referral  

## 2018-08-13 NOTE — Telephone Encounter (Signed)
Kathleen [name] wants to switch her rehab to the one in our building. She still need OT since her stroke. Please advise.

## 2018-08-18 ENCOUNTER — Encounter: Payer: Self-pay | Admitting: Rehabilitative and Restorative Service Providers"

## 2018-08-18 ENCOUNTER — Ambulatory Visit: Payer: Medicare HMO | Admitting: Rehabilitative and Restorative Service Providers"

## 2018-08-18 ENCOUNTER — Other Ambulatory Visit: Payer: Self-pay

## 2018-08-18 DIAGNOSIS — R531 Weakness: Secondary | ICD-10-CM

## 2018-08-18 DIAGNOSIS — R29898 Other symptoms and signs involving the musculoskeletal system: Secondary | ICD-10-CM

## 2018-08-18 DIAGNOSIS — R42 Dizziness and giddiness: Secondary | ICD-10-CM | POA: Diagnosis not present

## 2018-08-18 DIAGNOSIS — G8194 Hemiplegia, unspecified affecting left nondominant side: Secondary | ICD-10-CM

## 2018-08-18 NOTE — Therapy (Signed)
[place], [place], [address] Phone: [phone]   Fax:  [phone]  Physical Therapy Evaluation  Patient Details  Name: Kathleen [name] MRN: [id number] Date of Birth: Jun 20, 1941 Referring Provider (PT): Dr Beatrice Lecher   Encounter Date: 08/18/2018  PT End of Session - 08/18/18 1013    Visit Number  1    Number of Visits  12    Date for PT Re-Evaluation  09/29/18    PT Start Time  1013    PT Stop Time  1114    PT Time Calculation (min)  61 min    Activity Tolerance  Patient tolerated treatment well       Past Medical History:  Diagnosis Date  . Cancer ([place])    Hx RT breast infiltrated ductal CA previously on tamoxifen and armidex   . Herpes simplex    lt eye  . Hypertension   . Kidney stones    history  . MVP (mitral valve prolapse)    asymptomatic  . Personal history of chemotherapy   . Personal history of radiation therapy     Past Surgical History:  Procedure Laterality Date  . ABDOMINAL HYSTERECTOMY     partial  . BREAST BIOPSY    . BREAST LUMPECTOMY Right 2000   RT  . CERVICAL LAMINECTOMY  1986  . SHOULDER SURGERY     rotator cuff repair  . throidectomy  1988   For cancer     There were no vitals filed for this visit.   Subjective Assessment - 08/18/18 1019    Subjective  Patient reports that she is having weakness in her legs following a CVA 06/25/18 and was hospitalized for 3 days. She has been seen in PT/OT/ST in the hospital and at home. She feels continued weakness in the Lt side and some problems with balance.     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  No/denies         [facility] PT Assessment - 08/18/18 0001      Assessment   Medical Diagnosis  LE weakness s/p CVA    Referring Provider (PT)  Dr Beatrice Lecher    Onset Date/Surgical Date  06/25/18    Hand Dominance  Right    Next MD Visit  PRN     Prior Therapy  herre for LBP 2018      Precautions   Precautions  None      Restrictions   Weight Bearing Restrictions  No      Balance Screen   Has the patient fallen in the past 6 months  Yes    How many times?  1    Has the patient had a decrease in activity level because of a fear of falling?   No    Is the patient reluctant to leave their home because of a fear of falling?   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     Leisure  household chores; caring for dogs       Sensation   Additional Comments  intermittent tingling Lt foot sometimes Rt with certain positions       Strength   Right Hip Flexion  5/5    Right Hip Extension  4+/5    Right Hip ABduction  4/5    Right Hip ADduction  4+/5    Left Hip Flexion  4+/5    Left Hip Extension  4-/5    Left Hip ABduction  4-/5    Left Hip ADduction  4/5    Right Knee Flexion  5/5    Right Knee Extension  5/5    Left Knee Flexion  --   5-/5   Left Knee Extension  --   5-/5   Right Ankle Dorsiflexion  5/5    Right Ankle Plantar Flexion  4-/5    Left Ankle Dorsiflexion  4+/5    Left Ankle Plantar Flexion  3+/5      Berg Balance Test   Sit to Stand  Able to stand  independently using hands    Standing Unsupported  Able to stand safely 2 minutes    Sitting with Back Unsupported but Feet Supported on Floor or Stool  Able to sit safely and securely 2 minutes    Stand to Sit  Sits safely with minimal use of hands    Transfers  Able to transfer safely, minor use of hands     Standing Unsupported with Eyes Closed  Able to stand 10 seconds safely    Standing Ubsupported with Feet Together  Able to place feet together independently and stand 1 minute safely    From Standing, Reach Forward with Outstretched Arm  Can reach confidently >25 cm (10")    From Standing Position, Pick up Object from Floor  Able to pick up shoe safely and easily    From Standing Position, Turn to Look Behind Over each Shoulder  Looks behind from both sides and weight shifts well    Turn 360 Degrees  Able to turn 360 degrees safely one side only in 4 seconds or less    Standing Unsupported, Alternately Place Feet on Step/Stool  Able to stand independently and safely and complete 8 steps in 20 seconds    Standing Unsupported, One Foot in Front  Able to place foot tandem independently and hold 30 seconds    Standing on One Leg  Able to lift leg independently and hold 5-10 seconds    Total Score  53    Berg comment:  low risk for falls                 Objective measurements completed on examination: See above findings.      [place] Adult PT Treatment/Exercise - 08/18/18 0001      Knee/Hip Exercises: Standing   Functional Squat  10 reps;3 seconds   chair in front for safety    Other Standing Knee Exercises  standing balance activities - feet apart; feet together; stagger step       Knee/Hip Exercises: Seated   Sit to Sand  5 reps   repeated with Lt LE back x 5 reps             PT Education - 08/18/18 1059    Education Details  HEP     Person(s) Educated  Patient    Methods  Explanation;Demonstration;Tactile cues;Verbal cues;Handout    Comprehension  Verbalized understanding;Returned demonstration;Verbal cues required;Tactile cues required          PT Long Term Goals - 08/18/18 1236      PT LONG TERM GOAL #1   Title  I with advanced HEP 09/29/2018    Time  6    Period  Weeks    Status  New      PT LONG TERM GOAL #2   Title  Improve balance with ambulation and  gait - with patient reporting no falls for 4 weeks 09/29/2018    Time  6    Period  Weeks    Status  New      PT LONG TERM GOAL #3   Title  Increase strength bilat LE's to 4+/5 to 5/5 throughout 09/29/2018    Time  6    Period  Weeks    Status  New      PT LONG TERM GOAL #4   Title  Progress to return to work activities 09/29/2018    Time  6    Period  Weeks    Status  New      PT LONG TERM GOAL #5   Title  -    Time  0    Status  Unable to assess             Plan - 08/18/18 1220    Clinical Impression Statement  Kathleen [name] presents with LE weakness and decreased balance following CVA with Lt hemiparesis 06/25/18. She has decreased LE strength Lt > Rt; decreased balance for higher level activities with history of one fall since CVA. She will benefit from PT to address problems and return to normal functional activities.     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     PT Frequency  2x / week    PT Duration  6 weeks    PT Treatment/Interventions  Patient/family education;ADLs/Self Care Home Management;Cryotherapy;Electrical Stimulation;Iontophoresis 4mg /ml Dexamethasone;Moist Heat;Ultrasound;Vestibular;Therapeutic activities;Functional mobility training;Gait training;Balance training;Neuromuscular re-education    PT Next Visit Plan  review HEP; progess with strengthening; gait; balance activities;     Consulted and Agree with Plan of Care  Patient       Patient will benefit from skilled therapeutic intervention in order to improve the following deficits and impairments:  Postural dysfunction, Improper body mechanics, Pain, Decreased strength, Hypomobility, Decreased activity tolerance, Decreased balance  Visit  Diagnosis: Weakness generalized - Plan: PT plan of care cert/re-cert  Other symptoms and signs involving the musculoskeletal system - Plan: PT plan of care cert/re-cert  Hemiparesis of left nondominant side, unspecified hemiparesis etiology ([place]) - Plan: PT plan of care cert/re-cert  Dizziness and giddiness - Plan: PT plan of care cert/re-cert     Problem List Patient Active Problem List   Diagnosis Date Noted  . Primary osteoarthritis of left hip 07/08/2017  . Primary osteoarthritis of both knees 02/25/2017  . Lumbar spondylosis 12/03/2016  . History of breast cancer 11/09/2012  . Benign hematuria 07/05/2010  . POSTMENOPAUSAL STATUS 03/29/2010  . IMPAIRED FASTING GLUCOSE 12/11/2008  . LEG PAIN, BILATERAL 01/20/2008  . HIP  PAIN, RIGHT 01/05/2008  . Disorder of bone and cartilage 02/26/2007  . Hypothyroidism 04/28/2006  . Essential hypertension 04/28/2006  . MITRAL VALVE DISORDER 04/28/2006    Celyn Nilda Simmer PT, MPH  08/18/2018, 12:45 PM  [facility] [place], [place], [address] Phone: [phone]   Fax:  [phone]  Name: Kathleen [name] MRN: [id number] Date of Birth: 06-23-1941

## 2018-08-18 NOTE — Patient Instructions (Signed)
Standing during commercial of TV show 30-60 min show  Stand with feet slightly apart; feet together; one foot forward/one back; eyes closed  Standing on one foot 20-30 sec  Standing in corner of at counter for safety  Knee [facility] a chair for balance, slowly bend knees. Keep both feet on the floor. Repeat __5-10__ times. Do _2-3___ sessions per day.   SIT TO STAND: No Device    Sit with feet shoulder-width apart, on floor. Lean chest forward, raise hips up from surface. Straighten hips and knees. Weight bear equally on left and right sides. _5-10__ reps per set, __2-3_ sets per day Place left leg closer to sitting surface and repeat

## 2018-08-20 ENCOUNTER — Ambulatory Visit: Payer: Medicare HMO | Admitting: Physical Therapy

## 2018-08-20 DIAGNOSIS — R531 Weakness: Secondary | ICD-10-CM | POA: Diagnosis not present

## 2018-08-20 DIAGNOSIS — R29898 Other symptoms and signs involving the musculoskeletal system: Secondary | ICD-10-CM

## 2018-08-20 DIAGNOSIS — G8194 Hemiplegia, unspecified affecting left nondominant side: Secondary | ICD-10-CM

## 2018-08-20 NOTE — Patient Instructions (Signed)
Access Code: [email or web address]  URL: [email or web address]  Date: 08/20/2018  Prepared by: Kerin Perna   Exercises  Sit to Stand with Hands on Knees - 10-15 reps - 1x daily - 7x weekly  Step Up - 10 reps - 2 sets - 1x daily - 7x weekly  Standing Hip Abduction - 10 reps - 2 sets - 1x daily - 7x weekly  Standing Hip Extension - 10 reps - 2 sets - 1x daily - 7x weekly  Heel Toe Raises with Counter Support - 10-15 reps - 1 sets - 1x daily - 7x weekly  Heel Toe Raises with Unilateral Counter Support - 10-15 reps - 1 sets - 1x daily - 7x weekly  Tandem Stance - 2 reps - 2 sets - 15-20 seconds hold - 1x daily - 7x weekly

## 2018-08-20 NOTE — Therapy (Signed)
[place]  [place], [place], [address] Phone: [phone]   Fax:  [phone]  Physical Therapy Treatment  Patient Details  Name: Kathleen [name] MRN: [id number] Date of Birth: [birth year] Referring Provider (PT): Dr Beatrice Lecher   Encounter Date: 08/20/2018  PT End of Session - 08/20/18 0940    Visit Number  2    Number of Visits  12    Date for PT Re-Evaluation  09/29/18    PT Start Time  0935    PT Stop Time  1013    PT Time Calculation (min)  38 min       Past Medical History:  Diagnosis Date  . Cancer ([place])    Hx RT breast infiltrated ductal CA previously on tamoxifen and armidex   . Herpes simplex    lt eye  . Hypertension   . Kidney stones    history  . MVP (mitral valve prolapse)    asymptomatic  . Personal history of chemotherapy   . Personal history of radiation therapy     Past Surgical History:  Procedure Laterality Date  . ABDOMINAL HYSTERECTOMY     partial  . BREAST BIOPSY    . BREAST LUMPECTOMY Right 2000   RT  . CERVICAL LAMINECTOMY  1986  . SHOULDER SURGERY     rotator cuff repair  . throidectomy  1988   For cancer     There were no vitals filed for this visit.  Subjective Assessment - 08/20/18 0940    Subjective  Pt reports her husband is still driving her to appts.  She found out she returns to work 08/30/18, 3 days, 4 hrs per shift to start.   Otherwise no new changes.     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  No/denies         [facility] PT Assessment - 08/20/18 0001      Assessment   Medical Diagnosis  LE weakness s/p CVA    Referring Provider (PT)  Dr Beatrice Lecher    Onset Date/Surgical Date  06/25/18    Hand Dominance  Right    Next MD Visit  PRN     Prior Therapy  herre for LBP 2018       [facility] Adult PT  Treatment/Exercise - 08/20/18 0001      Knee/Hip Exercises: Stretches   Passive Hamstring Stretch  Right;Left;2 reps;30 seconds   seated with straight back     Knee/Hip Exercises: Aerobic   Nustep  L4: legs/arms x 6 min       Knee/Hip Exercises: Standing   Heel Raises  Both;1 set;15 reps   UE support on railing; with 15 toe raises   Hip Abduction  Right;Left;1 set;10 reps;Knee straight   UE support on railing   Hip Extension  Stengthening;Right;Left;1 set;10 reps    Extension Limitations  cues for posture, not to hyperext back    Forward Step Up  Right;Left;1 set;10 reps;Hand Hold: 2;Step Height: 6"    Forward Step Up Limitations  cues to slow speed.     Other Standing Knee Exercises  tandem stance x 15 sec x 2 reps each foot forward.       Knee/Hip Exercises: Seated   Sit to Sand  10 reps;without UE support   hands on thighs, eccentric lowering  PT Education - 08/20/18 1357    Education Details  updated HEP     Person(s) Educated  Patient    Methods  Explanation;Handout    Comprehension  Verbalized understanding;Returned demonstration          PT Long Term Goals - 08/18/18 1236      PT LONG TERM GOAL #1   Title  I with advanced HEP 09/29/2018    Time  6    Period  Weeks    Status  New      PT LONG TERM GOAL #2   Title  Improve balance with ambulation and gait - with patient reporting no falls for 4 weeks 09/29/2018    Time  6    Period  Weeks    Status  New      PT LONG TERM GOAL #3   Title  Increase strength bilat LE's to 4+/5 to 5/5 throughout 09/29/2018    Time  6    Period  Weeks    Status  New      PT LONG TERM GOAL #4   Title  Progress to return to work activities 09/29/2018    Time  6    Period  Weeks    Status  New      PT LONG TERM GOAL #5   Title  -    Time  0    Status  Unable to assess            Plan - 08/20/18 1016    Clinical Impression Statement  Pt reported some increase in LBP with standing LE exercises, but this was  eliminated with change in posture (pt was hyperextending back with hip ext).  Pt tolerated all other exercises well, reporting early fatigue in LLE but able to complete all repetitions.  Progressing towards goals.     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 (pt prefers to not exercise laying down due to dizziness with transitions)    PT Home Exercise Plan  access code: [email or web address]    Consulted and Agree with Plan of Care  Patient       Patient will benefit from skilled therapeutic intervention in order to improve the following deficits and impairments:  Postural dysfunction, Improper body mechanics, Pain, Decreased strength, Hypomobility, Decreased activity tolerance, Decreased balance  Visit Diagnosis: Weakness generalized  Other symptoms and signs involving the musculoskeletal system  Hemiparesis of left nondominant side, unspecified hemiparesis etiology [facility])     Problem List Patient Active Problem List   Diagnosis Date Noted  . Primary osteoarthritis of left hip 07/08/2017  . Primary osteoarthritis of both knees 02/25/2017  . Lumbar spondylosis 12/03/2016  . History of breast cancer 11/09/2012  . Benign hematuria 07/05/2010  . POSTMENOPAUSAL STATUS 03/29/2010  . IMPAIRED FASTING GLUCOSE 12/11/2008  . LEG PAIN, BILATERAL 01/20/2008  . HIP PAIN, RIGHT 01/05/2008  . Disorder of bone and cartilage 02/26/2007  . Hypothyroidism 04/28/2006  . Essential hypertension 04/28/2006  . MITRAL  VALVE DISORDER 04/28/2006   Kerin Perna, PTA 08/20/18 2:06 PM  [place]  [facility] [place] [address] [place], [place], [address] Phone: [phone]   Fax:  [phone]  Name: Kathleen [name] MRN: [id number] Date of Birth: 1941-02-10

## 2018-08-24 ENCOUNTER — Encounter: Payer: Self-pay | Admitting: Physical Therapy

## 2018-08-24 ENCOUNTER — Ambulatory Visit: Payer: Medicare HMO | Admitting: Physical Therapy

## 2018-08-24 DIAGNOSIS — R531 Weakness: Secondary | ICD-10-CM | POA: Diagnosis not present

## 2018-08-24 DIAGNOSIS — G8194 Hemiplegia, unspecified affecting left nondominant side: Secondary | ICD-10-CM | POA: Diagnosis not present

## 2018-08-24 DIAGNOSIS — R29898 Other symptoms and signs involving the musculoskeletal system: Secondary | ICD-10-CM

## 2018-08-24 NOTE — Therapy (Signed)
[facility] [place]  [place], [place], [address] Phone: [phone]   Fax:  [phone]  Physical Therapy Treatment  Patient Details  Name: Kathleen [name] MRN: [id number] Date of Birth: 1941/02/28 Referring Provider (PT): Dr Beatrice Lecher   Encounter Date: 08/24/2018  PT End of Session - 08/24/18 1151    Visit Number  3    Number of Visits  12    Date for PT Re-Evaluation  09/29/18    PT Start Time  3662    PT Stop Time  1226    PT Time Calculation (min)  38 min    Activity Tolerance  Patient tolerated treatment well;No increased pain    Behavior During Therapy  WFL for tasks assessed/performed       Past Medical History:  Diagnosis Date  . Cancer ([place])    Hx RT breast infiltrated ductal CA previously on tamoxifen and armidex   . Herpes simplex    lt eye  . Hypertension   . Kidney stones    history  . MVP (mitral valve prolapse)    asymptomatic  . Personal history of chemotherapy   . Personal history of radiation therapy     Past Surgical History:  Procedure Laterality Date  . ABDOMINAL HYSTERECTOMY     partial  . BREAST BIOPSY    . BREAST LUMPECTOMY Right 2000   RT  . CERVICAL LAMINECTOMY  1986  . SHOULDER SURGERY     rotator cuff repair  . throidectomy  1988   For cancer     There were no vitals filed for this visit.  Subjective Assessment - 08/24/18 1151    Subjective  Kathleen [name] reports she starts back at work next week.  She states she has been very busy and hasn't had a chance to do the exercises.      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  No/denies         [facility] PT Assessment - 08/24/18 0001      Assessment   Medical Diagnosis  LE weakness s/p CVA    Referring Provider (PT)  Dr Beatrice Lecher    Onset Date/Surgical Date  06/25/18    Hand Dominance   Right    Next MD Visit  PRN     Prior Therapy  herre for LBP 2018      Strength   Right Hip Flexion  5/5    Right Hip Extension  --   5-/5   Right Hip ABduction  4+/5    Left Hip Flexion  4+/5    Left Hip Extension  4/5    Left Hip ABduction  4-/5       [facility] Adult PT Treatment/Exercise - 08/24/18 0001      Knee/Hip Exercises: Stretches   Passive Hamstring Stretch  Right;Left;2 reps;30 seconds   seated with straight back     Knee/Hip Exercises: Aerobic   Nustep  L4: legs/arms x 6.5 min       Knee/Hip Exercises: Standing   Heel Raises  Both;1 set;20 reps;5 sets   UE support on railing; with 15 toe raises   Hip Abduction  Right;Left;1 set;Knee straight;15 reps;10 reps   UE support on railing   Hip Extension  Stengthening;Right;Left;1 set;15 reps   UE support on railing   Functional Squat  15 reps   holding sink   Other Standing Knee Exercises  tandem walking forward/backward x 12 ft x 3 reps with occasional UE support to steady    Other Standing Knee Exercises  tandem stance x 20 sec with horiz head turns       Knee/Hip Exercises: Seated   Sit to Sand  5 reps      Knee/Hip Exercises: Supine   Bridges  1 set;10 reps    Straight Leg Raises  Strengthening;Left;1 set;10 reps        PT Long Term Goals - 08/18/18 1236      PT LONG TERM GOAL #1   Title  I with advanced HEP 09/29/2018    Time  6    Period  Weeks    Status  New      PT LONG TERM GOAL #2   Title  Improve balance with ambulation and gait - with patient reporting no falls for 4 weeks 09/29/2018    Time  6    Period  Weeks    Status  New      PT LONG TERM GOAL #3   Title  Increase strength bilat LE's to 4+/5 to 5/5 throughout 09/29/2018    Time  6    Period  Weeks    Status  New      PT LONG TERM GOAL #4   Title  Progress to return to work activities 09/29/2018    Time  6    Period  Weeks    Status  New      PT LONG TERM GOAL #5   Title  -    Time  0    Status  Unable to assess             Plan - 08/24/18 1239    Clinical Impression Statement  Pt was able to tolerate some limited supine exercises, with head propped up without vertigo symptoms.  Leg strength is gradually improving. She has had no falls since starting therapy.  Pt tolerated all other exercises well, without report of early fatigue.  Pt may benefit from further treatment for vertigo.      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     PT Home Exercise Plan  access code: [email or web address]    Consulted and Agree with Plan of Care  Patient       Patient will benefit from skilled therapeutic intervention in order to improve the following deficits and impairments:  Postural dysfunction, Improper body mechanics, Pain, Decreased strength, Hypomobility, Decreased activity tolerance, Decreased balance  Visit Diagnosis: Weakness generalized  Other symptoms and signs involving the musculoskeletal system  Hemiparesis of left nondominant side, unspecified hemiparesis etiology [facility])     Problem List Patient Active Problem List   Diagnosis Date Noted  . Primary osteoarthritis of left hip 07/08/2017  . Primary osteoarthritis of both knees 02/25/2017  . Lumbar spondylosis 12/03/2016  . History of breast cancer 11/09/2012  . Benign hematuria 07/05/2010  . POSTMENOPAUSAL STATUS 03/29/2010  . IMPAIRED FASTING GLUCOSE 12/11/2008  .  LEG PAIN, BILATERAL 01/20/2008  . HIP PAIN, RIGHT 01/05/2008  .  Disorder of bone and cartilage 02/26/2007  . Hypothyroidism 04/28/2006  . Essential hypertension 04/28/2006  . MITRAL VALVE DISORDER 04/28/2006   Kerin Perna, PTA 08/24/18 12:57 PM  [place], [place], [address] Phone: [phone]   Fax:  [phone]  Name: Kathleen [name] MRN: [id number] Date of Birth: 1941-05-12

## 2018-08-27 ENCOUNTER — Encounter: Payer: Medicare HMO | Admitting: Physical Therapy

## 2018-08-27 ENCOUNTER — Encounter: Payer: Medicare HMO | Admitting: Rehabilitative and Restorative Service Providers"

## 2018-08-31 ENCOUNTER — Ambulatory Visit: Payer: Medicare HMO | Admitting: Rehabilitative and Restorative Service Providers"

## 2018-08-31 ENCOUNTER — Encounter: Payer: Self-pay | Admitting: Rehabilitative and Restorative Service Providers"

## 2018-08-31 DIAGNOSIS — R42 Dizziness and giddiness: Secondary | ICD-10-CM | POA: Diagnosis not present

## 2018-08-31 DIAGNOSIS — G8194 Hemiplegia, unspecified affecting left nondominant side: Secondary | ICD-10-CM

## 2018-08-31 DIAGNOSIS — R29898 Other symptoms and signs involving the musculoskeletal system: Secondary | ICD-10-CM

## 2018-08-31 DIAGNOSIS — M5442 Lumbago with sciatica, left side: Secondary | ICD-10-CM | POA: Diagnosis not present

## 2018-08-31 DIAGNOSIS — R531 Weakness: Secondary | ICD-10-CM | POA: Diagnosis not present
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
## 2018-09-01 ENCOUNTER — Telehealth: Payer: Self-pay | Admitting: Physical Therapy

## 2018-09-01 DIAGNOSIS — R29898 Other symptoms and signs involving the musculoskeletal system: Secondary | ICD-10-CM

## 2018-09-01 DIAGNOSIS — R42 Dizziness and giddiness: Secondary | ICD-10-CM

## 2018-09-01 DIAGNOSIS — Z8673 Personal history of transient ischemic attack (TIA), and cerebral infarction without residual deficits: Secondary | ICD-10-CM

## 2018-09-01 DIAGNOSIS — I10 Essential (primary) hypertension: Secondary | ICD-10-CM

## 2018-09-01 NOTE — Telephone Encounter (Signed)
New referral placed.

## 2018-09-01 NOTE — Telephone Encounter (Signed)
Ok to update referral or place new if needed.

## 2018-09-01 NOTE — Telephone Encounter (Signed)
Dr. Madilyn Fireman- Kathleen [name] has expressed interest in having [place] assess and treat her vertigo.  Can you update the referral to include vertigo so we can eval and treat at her next visit?  Thanks- Laureen Abrahams, PT, DPT 09/01/18 12:45 PM

## 2018-09-03 ENCOUNTER — Encounter: Payer: Self-pay | Admitting: Physical Therapy

## 2018-09-03 ENCOUNTER — Ambulatory Visit: Payer: Medicare HMO | Admitting: Physical Therapy

## 2018-09-03 DIAGNOSIS — G8194 Hemiplegia, unspecified affecting left nondominant side: Secondary | ICD-10-CM | POA: Diagnosis not present

## 2018-09-03 DIAGNOSIS — M6281 Muscle weakness (generalized): Secondary | ICD-10-CM | POA: Diagnosis not present

## 2018-09-03 DIAGNOSIS — R29898 Other symptoms and signs involving the musculoskeletal system: Secondary | ICD-10-CM

## 2018-09-03 DIAGNOSIS — H8113 Benign paroxysmal vertigo, bilateral: Secondary | ICD-10-CM

## 2018-09-03 DIAGNOSIS — R42 Dizziness and giddiness: Secondary | ICD-10-CM

## 2018-09-03 NOTE — Therapy (Signed)
[place], [place], [address] Phone: [phone]   Fax:  [phone]  Physical Therapy Treatment/Re-Eval  Patient Details  Name: Kathleen [name] MRN: [id number] Date of Birth: [birth year] Referring Provider (PT): Dr Beatrice Lecher   Encounter Date: 09/03/2018  PT End of Session - 09/03/18 1013    Visit Number  5    Number of Visits  12    Date for PT Re-Evaluation  09/29/18    PT Start Time  0930    PT Stop Time  1009    PT Time Calculation (min)  39 min    Activity Tolerance  Patient tolerated treatment well    Behavior During Therapy  [facility] for tasks assessed/performed       Past Medical History:  Diagnosis Date  . Cancer ([place])    Hx RT breast infiltrated ductal CA previously on tamoxifen and armidex   . Herpes simplex    lt eye  . Hypertension   . Kidney stones    history  . MVP (mitral valve prolapse)    asymptomatic  . Personal history of chemotherapy   . Personal history of radiation therapy     Past Surgical History:  Procedure Laterality Date  . ABDOMINAL HYSTERECTOMY     partial  . BREAST BIOPSY    . BREAST LUMPECTOMY Right 2000   RT  . CERVICAL LAMINECTOMY  1986  . SHOULDER SURGERY     rotator cuff repair  . throidectomy  1988   For cancer     There were no vitals filed for this visit.  Subjective Assessment - 09/03/18 0930    Subjective  presents today requesting eval/assessment of vertigo.  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  No/denies             Vestibular Assessment - 09/03/18 0933      Vestibular Assessment   General Observation  no symptoms at rest      Symptom Behavior   Type of Dizziness  Lightheadedness   denies spinning   Frequency of Dizziness   couple times a year    Duration of Dizziness  most times < 30 min; occasionally > 30 min    Aggravating Factors  No known aggravating factors    Relieving Factors  Medication;No known relieving factors      Occulomotor Exam   Occulomotor Alignment  Normal    Spontaneous  Absent    Gaze-induced  Absent    Smooth Pursuits  Intact    Saccades  Intact      Vestibulo-Occular Reflex   VOR 1 Head Only (x 1 viewing)  WNL with increase in symptoms    Comment  HIT (+) to Rt      Positional Testing   Dix-Hallpike  Dix-Hallpike Right;Dix-Hallpike Left    Horizontal Canal Testing  --      Dix-Hallpike Right   Dix-Hallpike Right Duration  8 sec    Dix-Hallpike Right Symptoms  Upbeat, left rotatory nystagmus      Dix-Hallpike Left   Dix-Hallpike Left Duration  2 sec    Dix-Hallpike Left Symptoms  Upbeat, left rotatory nystagmus               [facility] Adult PT Treatment/Exercise -  09/03/18 0001      Self-Care   Self-Care  Other Self-Care Comments    Other Self-Care Comments   BPPV; causes, physiology and treatment      Vestibular Treatment/Exercise - 09/03/18 0001      Vestibular Treatment/Exercise   Vestibular Treatment Provided  Canalith Repositioning    Canalith Repositioning  Epley Manuever Right;Epley Manuever Left       EPLEY MANUEVER RIGHT   Number of Reps   1    Overall Response  Improved Symptoms    Response Details   performed first; upbeating Lt rotary nystagmus seen in 2nd position so treated 2nd rep with Lt epleys       EPLEY MANUEVER LEFT   Number of Reps   1    Overall Response   Improved Symptoms            PT Education - 09/03/18 1013    Education Details  BPPV    Person(s) Educated  Patient    Methods  Explanation;Handout    Comprehension  Verbalized understanding          PT Long Term Goals - 09/03/18 1013      PT LONG TERM GOAL #1   Title  I with advanced HEP 09/29/2018    Time  6    Period  Weeks    Status  On-going      PT LONG  TERM GOAL #2   Title  Improve balance with ambulation and gait - with patient reporting no falls for 4 weeks 09/29/2018    Time  6    Period  Weeks    Status  On-going      PT LONG TERM GOAL #3   Title  Increase strength bilat LE's to 4+/5 to 5/5 throughout 09/29/2018    Time  6    Period  Weeks    Status  On-going      PT LONG TERM GOAL #4   Title  Progress to return to work activities 09/29/2018    Time  6    Period  Weeks    Status  On-going      PT LONG TERM GOAL #5   Title  demonstrate negative positional testing    Status  New    [place] Date  10/01/18            Plan - 09/03/18 1014    Clinical Impression Statement  Pt with bil BPPV noted today with improvement in symptoms on 2nd rep of epley's.  Feel pt will continue to benefit from PT to address vertigo.    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 reassess vertigo    PT Home Exercise Plan  access code: [email or web address]    Consulted and Agree with Plan of Care  Patient       Patient will benefit from skilled therapeutic intervention in order to improve the following deficits and impairments:  Postural dysfunction, Improper body mechanics, Pain, Decreased strength, Hypomobility, Decreased activity tolerance, Decreased balance, Dizziness  Visit Diagnosis: BPPV (benign paroxysmal positional vertigo), bilateral - Plan: PT plan of care  cert/re-cert  Weakness of both lower extremities - Plan: PT plan of care  cert/re-cert  Other symptoms and signs involving the musculoskeletal system - Plan: PT plan of care cert/re-cert  Hemiparesis of left nondominant side, unspecified hemiparesis etiology ([place]) - Plan: PT plan of care cert/re-cert  Dizziness and giddiness - Plan: PT plan of care cert/re-cert  Muscle weakness (generalized) - Plan: PT plan of care cert/re-cert     Problem List Patient Active Problem List   Diagnosis Date Noted  . Primary osteoarthritis of left hip 07/08/2017  . Primary osteoarthritis of both knees 02/25/2017  . Lumbar spondylosis 12/03/2016  . History of breast cancer 11/09/2012  . Benign hematuria 07/05/2010  . POSTMENOPAUSAL STATUS 03/29/2010  . IMPAIRED FASTING GLUCOSE 12/11/2008  . LEG PAIN, BILATERAL 01/20/2008  . HIP PAIN, RIGHT 01/05/2008  . Disorder of bone and cartilage 02/26/2007  . Hypothyroidism 04/28/2006  . Essential hypertension 04/28/2006  . MITRAL VALVE DISORDER 04/28/2006      Laureen Abrahams, PT, DPT 09/03/18 10:20 AM    [facility] [place], [place], [address] Phone: [phone]   Fax:  [phone]  Name: Jorgia Manthei MRN: [id number] Date of Birth: 31-Jul-1940

## 2018-09-08 ENCOUNTER — Ambulatory Visit: Payer: Medicare HMO | Admitting: Rehabilitative and Restorative Service Providers"

## 2018-09-08 ENCOUNTER — Encounter: Payer: Self-pay | Admitting: Rehabilitative and Restorative Service Providers"

## 2018-09-08 DIAGNOSIS — R42 Dizziness and giddiness: Secondary | ICD-10-CM

## 2018-09-08 DIAGNOSIS — R29898 Other symptoms and signs involving the musculoskeletal system: Secondary | ICD-10-CM | POA: Diagnosis not present

## 2018-09-08 DIAGNOSIS — G8194 Hemiplegia, unspecified affecting left nondominant side: Secondary | ICD-10-CM | POA: Diagnosis not present

## 2018-09-08 NOTE — Therapy (Addendum)
[facility] [place], [place], [address] Phone: [phone]   Fax:  [phone]  Physical Therapy Treatment  Patient Details  Name: Kathleen [name] MRN: [id number] Date of Birth: Jun 05, 1941 Referring Provider (PT): Dr Beatrice Lecher   Encounter Date: 09/08/2018  PT End of Session - 09/08/18 0856    Visit Number  6    Number of Visits  12    Date for PT Re-Evaluation  09/29/18    PT Start Time  0849    PT Stop Time  0925    PT Time Calculation (min)  36 min    Activity Tolerance  Patient tolerated treatment well       Past Medical History:  Diagnosis Date  . Cancer ([place])    Hx RT breast infiltrated ductal CA previously on tamoxifen and armidex   . Herpes simplex    lt eye  . Hypertension   . Kidney stones    history  . MVP (mitral valve prolapse)    asymptomatic  . Personal history of chemotherapy   . Personal history of radiation therapy     Past Surgical History:  Procedure Laterality Date  . ABDOMINAL HYSTERECTOMY     partial  . BREAST BIOPSY    . BREAST LUMPECTOMY Right 2000   RT  . CERVICAL LAMINECTOMY  1986  . SHOULDER SURGERY     rotator cuff repair  . throidectomy  1988   For cancer     There were no vitals filed for this visit.      [facility] PT Assessment - 09/08/18 0001      Assessment   Medical Diagnosis  LE weakness s/p CVA    Referring Provider (PT)  Dr Beatrice Lecher    Onset Date/Surgical Date  06/25/18    Hand Dominance  Right    Next MD Visit  PRN     Prior Therapy  herre for LBP 2018      Strength   Right Hip Flexion  5/5    Right Hip Extension  5/5    Right Hip ABduction  5/5    Left Hip Flexion  5/5    Left Hip Extension  5/5    Left Hip ABduction  5/5    Right Knee Flexion  5/5    Right Knee Extension  5/5                   [facility] Adult PT Treatment/Exercise - 09/08/18 0001      Knee/Hip Exercises: Stretches   Passive  Hamstring Stretch  Right;Left;2 reps;30 seconds    Quad Stretch  Right;Left;1 rep;30 seconds   PT assist      Knee/Hip Exercises: Aerobic   Nustep  L5: legs/arms x 10 min       Knee/Hip Exercises: Standing   Forward Step Up  Right;Left;1 set;10 reps;Hand Hold: 2;Step Height: 6"    SLS  10 sec x 1 each LE no hand hold       Knee/Hip Exercises: Seated   Sit to Sand  10 reps   half with Lt LE back slightly to increase work on [employer]      Knee/Hip Exercises: Supine   Bridges  1 set;10 reps   pulling Lt then Rt foot back to increase unilateral work    Other Supine Knee/Hip Exercises  clam with green TB alternating LE's x 10 each LE  PT Long Term Goals - 09/08/18 0858      PT LONG TERM GOAL #1   Title  I with advanced HEP 09/29/2018    Time  6    Period  Weeks    Status  Achieved      PT LONG TERM GOAL #2   Title  Improve balance with ambulation and gait - with patient reporting no falls for 4 weeks 09/29/2018    Time  6    Period  Weeks    Status  Achieved      PT LONG TERM GOAL #3   Title  Increase strength bilat LE's to 4+/5 to 5/5 throughout 09/29/2018    Time  6    Period  Weeks    Status  Achieved      PT LONG TERM GOAL #4   Title  Progress to return to work activities 09/29/2018    Time  6    Period  Weeks    Status  Achieved            Plan - 09/08/18 0856    Clinical Impression Statement  No symptoms of dizziness or BPPV. Kathleen [name] will call to cancel appointment if she remains symptom free through tomorrow. She will continue with HEP for strengthening. Goals of therapy have been accomplished.     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     PT Frequency  2x / week    PT Duration  6 weeks    PT Treatment/Interventions  Patient/family education;ADLs/Self Care Home Management;Cryotherapy;Electrical Stimulation;Iontophoresis '4mg'$ /ml Dexamethasone;Moist  Heat;Ultrasound;Vestibular;Therapeutic activities;Functional mobility training;Gait training;Balance training;Neuromuscular re-education;Canalith Repostioning    PT Next Visit Plan  d/c to HEP     PT Home Exercise Plan  access code: [email or web address]    Consulted and Agree with Plan of Care  Patient       Patient will benefit from skilled therapeutic intervention in order to improve the following deficits and impairments:  Postural dysfunction, Improper body mechanics, Pain, Decreased strength, Hypomobility, Decreased activity tolerance, Decreased balance, Dizziness  Visit Diagnosis: Weakness of both lower extremities  Other symptoms and signs involving the musculoskeletal system  Hemiparesis of left nondominant side, unspecified hemiparesis etiology (HCC)  Dizziness and giddiness     Problem List Patient Active Problem List   Diagnosis Date Noted  . Primary osteoarthritis of left hip 07/08/2017  . Primary osteoarthritis of both knees 02/25/2017  . Lumbar spondylosis 12/03/2016  . History of breast cancer 11/09/2012  . Benign hematuria 07/05/2010  . POSTMENOPAUSAL STATUS 03/29/2010  . IMPAIRED FASTING GLUCOSE 12/11/2008  . LEG PAIN, BILATERAL 01/20/2008  . HIP PAIN, RIGHT 01/05/2008  . Disorder of bone and cartilage 02/26/2007  . Hypothyroidism 04/28/2006  . Essential hypertension 04/28/2006  . MITRAL VALVE DISORDER 04/28/2006    Kathleen [name] Nilda Simmer PT,  MPH  09/08/2018, 9:43 AM  [facility] [place], [place], [address] Phone: [phone]   Fax:  [phone]  Name: Kathleen [name] MRN: [id number] Date of Birth: 09/17/1940  PHYSICAL THERAPY DISCHARGE SUMMARY  Visits from Start of Care: 6  Current functional level related to goals / functional outcomes: See progress note for discharge status    Remaining deficits: Unknown - doing well at last visit    Education / Equipment: HEP  Plan: Patient  agrees to discharge.  Patient goals were met. Patient is being discharged due  to meeting the stated rehab goals.  ?????     Kathleen [name] P. Helene Kelp PT, MPH 09/23/18 10:09 AM

## 2018-09-10 ENCOUNTER — Encounter: Payer: Medicare HMO | Admitting: Physical Therapy

## 2018-09-16 ENCOUNTER — Ambulatory Visit (INDEPENDENT_AMBULATORY_CARE_PROVIDER_SITE_OTHER): Payer: Medicare HMO | Admitting: Family Medicine

## 2018-09-16 ENCOUNTER — Encounter: Payer: Self-pay | Admitting: Family Medicine

## 2018-09-16 VITALS — BP 132/78 | HR 76 | Ht 62.0 in | Wt 116.0 lb

## 2018-09-16 DIAGNOSIS — I10 Essential (primary) hypertension: Secondary | ICD-10-CM | POA: Diagnosis not present

## 2018-09-16 DIAGNOSIS — Z8673 Personal history of transient ischemic attack (TIA), and cerebral infarction without residual deficits: Secondary | ICD-10-CM | POA: Diagnosis not present

## 2018-09-16 DIAGNOSIS — F439 Reaction to severe stress, unspecified: Secondary | ICD-10-CM | POA: Diagnosis not present

## 2018-09-16 NOTE — Progress Notes (Signed)
Subjective:    CC: Low up recent stroke.  HPI:  History of recent stroke.  She did finish up physical therapy for generalized weakness and she says that they actually released her about a week ago and said that she was actually doing really well.  She has been back at work 2 days/week and is ready to go full-time.  He feels like she is strong enough to go back to work full-time and does not have any significant residual symptoms.  Ulyses Jarred is having a lot of difficulty with her husband he is just been very angry and irritable.  It is made it very stressful at home and when she feels stressed her blood pressure jumps up. Hypertension- Pt denies chest pain, SOB, dizziness, or heart palpitations.  Taking meds as directed w/o problems.  Denies medication side effects.     Past medical history, Surgical history, Family history not pertinant except as noted below, Social history, Allergies, and medications have been entered into the medical record, reviewed, and corrections made.   Review of Systems: No fevers, chills, night sweats, weight loss, chest pain, or shortness of breath.   Objective:    General: Well Developed, well nourished, and in no acute distress.  Neuro: Alert and oriented x3, extra-ocular muscles intact, sensation grossly intact.  HEENT: Normocephalic, atraumatic  Skin: Warm and dry, no rashes. Cardiac: Regular rate and rhythm, no murmurs rubs or gallops, no lower extremity edema.  Respiratory: Clear to auscultation bilaterally. Not using accessory muscles, speaking in full sentences.   Impression and Recommendations:    History of recent stroke-do feel like she is safe to return to full-time work.  Note was provided today so that she can give it to work with.  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  He is taking her statin and has been sticking with it consistently.  I would like to recheck her lipid levels now that she is on 80  mg.  Acute stress-encouraged her to think about talking to someone and maybe even doing some therapy or counseling I think it could be really helpful for her in dealing with her stress levels.  Also encouraged her to see if her husband would really willing to discuss with me at his next office visit some of his concerns in regards to his anger and depression.  We also discussed possibly medication for her as well depending on how she continues to cope with the stresses.  Actually really enjoys work and so I think getting back into full-time work would be positive for her as well.  Hypertension-initial blood pressure was elevated but she was little stressed when she first came in.  Repeat blood pressure is at goal and so we will continue to monitor.  She is due for updated CMP and lipid panel.

## 2018-09-17 DIAGNOSIS — Z8673 Personal history of transient ischemic attack (TIA), and cerebral infarction without residual deficits: Secondary | ICD-10-CM | POA: Insufficient documentation

## 2018-09-29 DIAGNOSIS — I10 Essential (primary) hypertension: Secondary | ICD-10-CM | POA: Diagnosis not present

## 2018-09-30 LAB — COMPLETE METABOLIC PANEL WITH GFR
AG Ratio: 2 (calc) (ref 1.0–2.5)
ALKALINE PHOSPHATASE (APISO): 71 U/L (ref 37–153)
ALT: 20 U/L (ref 6–29)
AST: 26 U/L (ref 10–35)
Albumin: 4.1 g/dL (ref 3.6–5.1)
BUN: 20 mg/dL (ref 7–25)
CO2: 27 mmol/L (ref 20–32)
Calcium: 9.5 mg/dL (ref 8.6–10.4)
Chloride: 105 mmol/L (ref 98–110)
Creat: 0.67 mg/dL (ref 0.60–0.93)
GFR, Est African American: 98 mL/min/{1.73_m2} (ref >=60)
GFR, Est Non African American: 84 mL/min/{1.73_m2} (ref >=60)
Globulin: 2.1 g/dL (calc) (ref 1.9–3.7)
Glucose, Bld: 94 mg/dL (ref 65–99)
Potassium: 3.8 mmol/L (ref 3.5–5.3)
Sodium: 141 mmol/L (ref 135–146)
Total Bilirubin: 1 mg/dL (ref 0.2–1.2)
Total Protein: 6.2 g/dL (ref 6.1–8.1)

## 2018-09-30 LAB — LIPID PANEL
Cholesterol: 159 mg/dL (ref <200)
HDL: 53 mg/dL (ref >=50)
LDL Cholesterol (Calc): 94 mg/dL (calc)
Non-HDL Cholesterol (Calc): 106 mg/dL (calc) (ref <130)
TRIGLYCERIDES: 41 mg/dL (ref <150)
Total CHOL/HDL Ratio: 3 (calc) (ref <5.0)

## 2018-10-01 NOTE — Progress Notes (Signed)
All labs are normal. 

## 2018-11-09 ENCOUNTER — Other Ambulatory Visit: Payer: Self-pay | Admitting: Family Medicine

## 2018-11-12 ENCOUNTER — Ambulatory Visit (INDEPENDENT_AMBULATORY_CARE_PROVIDER_SITE_OTHER): Payer: Medicare HMO | Admitting: Family Medicine

## 2018-11-12 ENCOUNTER — Encounter: Payer: Self-pay | Admitting: Family Medicine

## 2018-11-12 VITALS — BP 127/65 | HR 70 | Temp 98.7°F | Wt 115.1 lb

## 2018-11-12 DIAGNOSIS — I10 Essential (primary) hypertension: Secondary | ICD-10-CM

## 2018-11-12 DIAGNOSIS — R7301 Impaired fasting glucose: Secondary | ICD-10-CM

## 2018-11-12 DIAGNOSIS — E039 Hypothyroidism, unspecified: Secondary | ICD-10-CM

## 2018-11-12 MED ORDER — METOPROLOL SUCCINATE ER 25 MG PO TB24: 25.0000 mg | ORAL_TABLET | Freq: Every day | ORAL | 1 refills | Status: DC

## 2018-11-12 NOTE — Progress Notes (Signed)
Virtual Visit via Telephone Note  I connected with Kathleen [name] on 11/12/18 at  1:20 PM EDT by telephone and verified that I am speaking with the correct person using two identifiers.   I discussed the limitations, risks, security and privacy concerns of performing an evaluation and management service by telephone and the availability of in person appointments. I also discussed with the patient that there may be a patient responsible charge related to this service. The patient expressed understanding and agreed to proceed.   Subjective:    CC: Follow-up blood pressure.  HPI:  Hypertension- Pt denies chest pain, SOB, dizziness, or heart palpitations.  Taking meds as directed w/o problems.  She actually quit taking her metoprolol at some point.  She was on it when I saw her in February but when she went to look through her medication she realized that she was not taking it.  Hypothyroidism - Taking medication regularly in the AM away from food and vitamins, etc. No recent change to skin, hair, or energy levels.  Last time we checked her thyroid level was in September and we did make an adjustment at that time.  The plan was to repeat her levels in 6 to 8 weeks but we have not done that. She is taking 1/2 tab two days per week and whole tab the other 5 days.   Abnormal glucose-last A1c about 9 months ago was also borderline at 5.7.  It is time to recheck those levels.  She has been out of work. She has been stressed and has been missing work.   S/p stroke. We held her lipitor to see if she felt better.  She said she does feel better overall but is not sure if it was related to the Lipitor not.  She also quit taking her baby aspirin and says that her bruising has improved significantly.  Past medical history, Surgical history, Family history not pertinant except as noted below, Social history, Allergies, and medications have been entered into the medical record, reviewed, and corrections  made.   Review of Systems: No fevers, chills, night sweats, weight loss, chest pain, or shortness of breath.   Objective:    General: Speaking clearly in complete sentences without any shortness of breath.  Alert and oriented x3.  Normal judgment. No apparent acute distress.    Impression and Recommendations:    HTN - Well controlled. Restart the metoprol at bedtime. Ok to take half a tab.  Continue current regimen. Follow up in 3 months.   Hypothyroidism - due to recheck TSH.   Abnormal glucose-time to recheck hemoglobin A1c. Can to lab later next month.    S/P Stroke - Have her start 81mg  of ASA daily and retry her Lipitor. She can start with half a tab.  We discussed the importance of the aspirin and a statin to help reduce her risk of future stroke.     I discussed the assessment and treatment plan with the patient. The patient was provided an opportunity to ask questions and all were answered. The patient agreed with the plan and demonstrated an understanding of the instructions.   The patient was advised to call back or seek an in-person evaluation if the symptoms worsen or if the condition fails to improve as anticipated.  I provided 26 minutes of non-face-to-face time during this encounter.   Beatrice Lecher, MD

## 2018-11-19 DIAGNOSIS — C44519 Basal cell carcinoma of skin of other part of trunk: Secondary | ICD-10-CM | POA: Diagnosis not present

## 2019-01-19 ENCOUNTER — Other Ambulatory Visit: Payer: Self-pay | Admitting: Family Medicine

## 2019-01-26 ENCOUNTER — Ambulatory Visit (INDEPENDENT_AMBULATORY_CARE_PROVIDER_SITE_OTHER): Payer: Medicare HMO | Admitting: Family Medicine

## 2019-01-26 ENCOUNTER — Other Ambulatory Visit: Payer: Self-pay

## 2019-01-26 ENCOUNTER — Encounter: Payer: Self-pay | Admitting: Family Medicine

## 2019-01-26 VITALS — BP 136/72 | HR 62 | Ht 62.0 in | Wt 115.0 lb

## 2019-01-26 DIAGNOSIS — R319 Hematuria, unspecified: Secondary | ICD-10-CM

## 2019-01-26 DIAGNOSIS — R3911 Hesitancy of micturition: Secondary | ICD-10-CM

## 2019-01-26 DIAGNOSIS — Z8673 Personal history of transient ischemic attack (TIA), and cerebral infarction without residual deficits: Secondary | ICD-10-CM

## 2019-01-26 DIAGNOSIS — I10 Essential (primary) hypertension: Secondary | ICD-10-CM | POA: Diagnosis not present

## 2019-01-26 DIAGNOSIS — R351 Nocturia: Secondary | ICD-10-CM | POA: Diagnosis not present

## 2019-01-26 LAB — POCT URINALYSIS DIPSTICK
Bilirubin, UA: NEGATIVE
Glucose, UA: NEGATIVE
Ketones, UA: NEGATIVE
Leukocytes, UA: NEGATIVE
Nitrite, UA: NEGATIVE
Protein, UA: NEGATIVE
Spec Grav, UA: 1.02 (ref 1.010–1.025)
Urobilinogen, UA: 0.2 E.U./dL
pH, UA: 6.5 (ref 5.0–8.0)

## 2019-01-26 NOTE — Progress Notes (Signed)
Established Patient Office Visit  Subjective:  Patient ID: Kathleen [name], female    DOB: 09/14/1940  Age: 78 y.o. MRN: [id number]  CC:  Chief Complaint  Patient presents with  . Hypertension    HPI Kathleen [name] presents for   Hypertension- Pt denies chest pain, SOB, dizziness, or heart palpitations.  Taking meds as directed w/o problems.  Denies medication side effects.  Had her restart the metoprolol at bedtime and a half a tab.  She is taking a whole tab now and has actually been tolerating it well without any significant problems or side effects.  She does complain of feeling tired a lot but just thinks it is also because she is working a lot.  Status post stroke-she says occasionally when she gets really tired she will notice she will have a little stumbling with her speech she thinks that is just a remnant of the stroke.  She did take a half a tab of the atorvastatin for quite a while and said she did well and so eventually went up to a whole tab and has been able to take that regularly over the last month without any significant side effects.  Hyperlipidemia-she had stopped her Lipitor because she thought it was causing some side effects but had encouraged her to retry it at least a half a tab.  Still complains of some nocturia.  She says has been going on for a couple of months.  She has been getting up about 2-3 x per night.  She has a hx of microscopic hematuria.    She has been working a lot. She is still full time at [employer] and says some days she is just exhausted.  She has been still helping to unload truck.     Past Medical History:  Diagnosis Date  . Cancer ([place])    Hx RT breast infiltrated ductal CA previously on tamoxifen and armidex   . Herpes simplex    lt eye  . Hypertension   . Kidney stones    history  . MVP (mitral valve prolapse)    asymptomatic  . Personal history of chemotherapy   . Personal history of radiation therapy     Past  Surgical History:  Procedure Laterality Date  . ABDOMINAL HYSTERECTOMY     partial  . BREAST BIOPSY    . BREAST LUMPECTOMY Right 2000   RT  . CERVICAL LAMINECTOMY  1986  . SHOULDER SURGERY     rotator cuff repair  . throidectomy  1988   For cancer     Family History  Problem Relation Age of Onset  . Breast cancer Mother     Social History   Socioeconomic History  . Marital status: Married    Spouse name: Not on file  . Number of children: Not on file  . Years of education: Not on file  . Highest education level: Not on file  Occupational History  . Not on file  Social Needs  . Financial resource strain: Not on file  . Food insecurity    Worry: Not on file    Inability: Not on file  . Transportation needs    Medical: Not on file    Non-medical: Not on file  Tobacco Use  . Smoking status: Never Smoker  . Smokeless tobacco: Never Used  Substance and Sexual Activity  . Alcohol use: No  . Drug use: Not on file  . 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  Lifestyle  . Physical activity    Days per week: Not on file    Minutes per session: Not on file  . Stress: Not on file  Relationships  . Social Herbalist on phone: Not on file    Gets together: Not on file    Attends religious service: Not on file    Active member of club or organization: Not on file    Attends meetings of clubs or organizations: Not on file    Relationship status: Not on file  . Intimate partner violence    Fear of current or ex partner: Not on file    Emotionally abused: Not on file    Physically abused: Not on file    Forced sexual activity: Not on file  Other Topics Concern  . Not on file  Social History Narrative  . Not on file    Outpatient Medications Prior to Visit  Medication Sig Dispense Refill  . aspirin EC 81 MG tablet Take 81 mg by mouth  daily.    Marland Kitchen atorvastatin (LIPITOR) 80 MG tablet Take 80 mg by mouth daily.     Marland Kitchen levothyroxine (SYNTHROID) 75 MCG tablet TAKE ONE (1) TABLET BY MOUTH EACH DAY 30 tablet 1  . metoprolol succinate (TOPROL-XL) 25 MG 24 hr tablet Take 1 tablet (25 mg total) by mouth at bedtime. 90 tablet 1  . TURMERIC PO Take by mouth.    . valACYclovir (VALTREX) 500 MG tablet TAKE ONE (1) TABLET BY MOUTH EACH DAY 30 tablet 6   No facility-administered medications prior to visit.     Allergies  Allergen Reactions  . Streptomycin Other (See Comments)    Passed out  . Amlodipine Other (See Comments)    Dizziness on 5mg    . Codeine     Other reaction(s): Other (See Comments)  . Penicillins   . Sulfonamide Derivatives   . Sulfur     Other reaction(s): Other (See Comments) Pt not sure  . Benicar [Olmesartan] Other (See Comments)    Diizzy  . Hctz [Hydrochlorothiazide] Other (See Comments)    Vertigo, dizziness.   . Lisinopril Other (See Comments)    lightheaded    ROS Review of Systems    Objective:    Physical Exam  Constitutional: She is oriented to person, place, and time. She appears well-developed and well-nourished.  HENT:  Head: Normocephalic and atraumatic.  Cardiovascular: Normal rate, regular rhythm and normal heart sounds.  Pulmonary/Chest: Effort normal and breath sounds normal.  Neurological: She is alert and oriented to person, place, and time.  Skin: Skin is warm and dry.  Psychiatric: She has a normal mood and affect. Her behavior is normal.    BP 136/72   Pulse 62   Ht 5\' 2"  (1.575 m)   Wt 115 lb (52.2 kg)   SpO2 100%   BMI 21.03 kg/m  Wt Readings from Last 3 Encounters:  01/26/19 115 lb (52.2 kg)  11/12/18 115 lb 1.6 oz (52.2 kg)  09/16/18 116 lb (52.6 kg)     There are no preventive care reminders to display for this patient.  There are no preventive care reminders to display for this patient.  Lab Results  Component Value Date   TSH 0.11 (L) 03/26/2018    Lab Results  Component Value Date   WBC 5.9 02/05/2018  HGB 13.8 02/05/2018   HCT 41.3 02/05/2018   MCV 91.8 02/05/2018   PLT 229 02/05/2018   Lab Results  Component Value Date   NA 141 09/29/2018   K 3.8 09/29/2018   CO2 27 09/29/2018   GLUCOSE 94 09/29/2018   BUN 20 09/29/2018   CREATININE 0.67 09/29/2018   BILITOT 1.0 09/29/2018   ALKPHOS 58 02/23/2017   AST 26 09/29/2018   ALT 20 09/29/2018   PROT 6.2 09/29/2018   ALBUMIN 4.0 02/23/2017   CALCIUM 9.5 09/29/2018   Lab Results  Component Value Date   CHOL 159 09/29/2018   Lab Results  Component Value Date   HDL 53 09/29/2018   Lab Results  Component Value Date   LDLCALC 94 09/29/2018   Lab Results  Component Value Date   TRIG 41 09/29/2018   Lab Results  Component Value Date   CHOLHDL 3.0 09/29/2018   Lab Results  Component Value Date   HGBA1C 5.7 (A) 02/05/2018      Assessment & Plan:   Problem List Items Addressed This Visit      Cardiovascular and Mediastinum   Essential hypertension    Well controlled. Continue current regimen. Follow up in  4-6 months.         Other   S/P stroke due to cerebrovascular disease    Overall.  She is actually been able to tolerate a whole tab of the metoprolol and a whole tab of the atorvastatin without any significant side effects or concerns.  Follow-up in 4 to 6 months.       Other Visit Diagnoses    Nocturia    -  Primary   Relevant Orders   POCT urinalysis dipstick (Completed)   Urinary hesitancy       Hematuria, unspecified type       Relevant Orders   Urine Culture     Nocturia-she thinks it is just because she is exhausted.  Urinalysis was negative today except for some microscopic blood which is not new for her.  She has benign hematuria.  There was sent for culture just for confirmation but suspect that she is probably getting a little overactive bladder.  No orders of the defined types were placed in this encounter.   Follow-up: Return  in about 4 months (around 05/29/2019) for Hypertension.    Beatrice Lecher, MD

## 2019-01-26 NOTE — Assessment & Plan Note (Signed)
Overall.  She is actually been able to tolerate a whole tab of the metoprolol and a whole tab of the atorvastatin without any significant side effects or concerns.  Follow-up in 4 to 6 months.

## 2019-01-26 NOTE — Assessment & Plan Note (Signed)
Well controlled. Continue current regimen. Follow up in 4-6 months.  

## 2019-01-27 LAB — URINE CULTURE
MICRO NUMBER:: 646298
Result:: NO GROWTH
SPECIMEN QUALITY:: ADEQUATE

## 2019-02-02 DIAGNOSIS — R7301 Impaired fasting glucose: Secondary | ICD-10-CM | POA: Diagnosis not present

## 2019-02-02 DIAGNOSIS — I10 Essential (primary) hypertension: Secondary | ICD-10-CM | POA: Diagnosis not present

## 2019-02-02 DIAGNOSIS — E039 Hypothyroidism, unspecified: Secondary | ICD-10-CM | POA: Diagnosis not present

## 2019-02-03 LAB — HEMOGLOBIN A1C
Hgb A1c MFr Bld: 5.8 % of total Hgb — ABNORMAL HIGH (ref <5.7)
Mean Plasma Glucose: 120 (calc)
eAG (mmol/L): 6.6 (calc)

## 2019-02-03 LAB — TSH: TSH: 1.25 mIU/L (ref 0.40–4.50)

## 2019-02-23 ENCOUNTER — Other Ambulatory Visit: Payer: Self-pay | Admitting: Family Medicine

## 2019-03-02 ENCOUNTER — Ambulatory Visit (INDEPENDENT_AMBULATORY_CARE_PROVIDER_SITE_OTHER): Payer: Medicare HMO | Admitting: Family Medicine

## 2019-03-02 ENCOUNTER — Other Ambulatory Visit: Payer: Self-pay

## 2019-03-02 ENCOUNTER — Encounter: Payer: Self-pay | Admitting: Family Medicine

## 2019-03-02 DIAGNOSIS — Z23 Encounter for immunization: Secondary | ICD-10-CM

## 2019-03-02 NOTE — Progress Notes (Signed)
Pt here for flu shot. Afebrile,no recent illness. Vaccination given in LD, pt tolerated well  Manufacture:  [employer].  XLE:17471-595-39  Lot: [id number] Exp: 05.18.2021  .Marland KitchenElouise Munroe, [place]

## 2019-03-03 NOTE — Progress Notes (Signed)
Agree with documentation as above.   Catherine Metheney, MD  

## 2019-03-08 DIAGNOSIS — Z23 Encounter for immunization: Secondary | ICD-10-CM

## 2019-03-30 ENCOUNTER — Other Ambulatory Visit: Payer: Self-pay | Admitting: Family Medicine

## 2019-04-28 DIAGNOSIS — L570 Actinic keratosis: Secondary | ICD-10-CM | POA: Diagnosis not present

## 2019-04-28 DIAGNOSIS — L814 Other melanin hyperpigmentation: Secondary | ICD-10-CM | POA: Diagnosis not present

## 2019-04-28 DIAGNOSIS — Z85828 Personal history of other malignant neoplasm of skin: Secondary | ICD-10-CM | POA: Diagnosis not present

## 2019-04-28 DIAGNOSIS — D1801 Hemangioma of skin and subcutaneous tissue: Secondary | ICD-10-CM | POA: Diagnosis not present

## 2019-04-28 DIAGNOSIS — C44629 Squamous cell carcinoma of skin of left upper limb, including shoulder: Secondary | ICD-10-CM | POA: Diagnosis not present

## 2019-04-28 DIAGNOSIS — C44612 Basal cell carcinoma of skin of right upper limb, including shoulder: Secondary | ICD-10-CM | POA: Diagnosis not present

## 2019-04-28 DIAGNOSIS — D485 Neoplasm of uncertain behavior of skin: Secondary | ICD-10-CM | POA: Diagnosis not present

## 2019-04-28 DIAGNOSIS — L905 Scar conditions and fibrosis of skin: Secondary | ICD-10-CM | POA: Diagnosis not present

## 2019-05-10 DIAGNOSIS — H04123 Dry eye syndrome of bilateral lacrimal glands: Secondary | ICD-10-CM | POA: Diagnosis not present

## 2019-05-10 DIAGNOSIS — H35372 Puckering of macula, left eye: Secondary | ICD-10-CM | POA: Diagnosis not present

## 2019-05-10 DIAGNOSIS — H26491 Other secondary cataract, right eye: Secondary | ICD-10-CM | POA: Diagnosis not present

## 2019-05-13 ENCOUNTER — Other Ambulatory Visit: Payer: Self-pay | Admitting: Family Medicine

## 2019-05-13 DIAGNOSIS — Z1231 Encounter for screening mammogram for malignant neoplasm of breast: Secondary | ICD-10-CM

## 2019-05-16 ENCOUNTER — Other Ambulatory Visit: Payer: Self-pay | Admitting: Family Medicine

## 2019-05-16 ENCOUNTER — Other Ambulatory Visit: Payer: Self-pay | Admitting: *Deleted

## 2019-05-24 DIAGNOSIS — D0462 Carcinoma in situ of skin of left upper limb, including shoulder: Secondary | ICD-10-CM | POA: Diagnosis not present

## 2019-05-24 DIAGNOSIS — C44612 Basal cell carcinoma of skin of right upper limb, including shoulder: Secondary | ICD-10-CM | POA: Diagnosis not present

## 2019-05-24 DIAGNOSIS — L905 Scar conditions and fibrosis of skin: Secondary | ICD-10-CM | POA: Diagnosis not present

## 2019-06-10 ENCOUNTER — Other Ambulatory Visit: Payer: Self-pay | Admitting: Family Medicine

## 2019-07-20 ENCOUNTER — Other Ambulatory Visit: Payer: Self-pay

## 2019-07-20 ENCOUNTER — Ambulatory Visit
Admission: RE | Admit: 2019-07-20 | Discharge: 2019-07-20 | Disposition: A | Discharge status: Home / Self Care | Payer: Medicare HMO | Source: Ambulatory Visit | Attending: Family Medicine | Admitting: Family Medicine

## 2019-07-20 DIAGNOSIS — Z1231 Encounter for screening mammogram for malignant neoplasm of breast: Secondary | ICD-10-CM

## 2019-08-12 DIAGNOSIS — L570 Actinic keratosis: Secondary | ICD-10-CM | POA: Diagnosis not present

## 2019-08-12 DIAGNOSIS — L905 Scar conditions and fibrosis of skin: Secondary | ICD-10-CM | POA: Diagnosis not present

## 2019-08-12 DIAGNOSIS — Z85828 Personal history of other malignant neoplasm of skin: Secondary | ICD-10-CM | POA: Diagnosis not present

## 2019-08-17 ENCOUNTER — Telehealth: Payer: Self-pay | Admitting: Family Medicine

## 2019-08-17 NOTE — Telephone Encounter (Signed)
Patient called to see if our office was giving the covid vaccine and I advised her that we were not giving the vaccine out. She had reached out to the health department and was told they are not taking appointments at this time. She is going to give it a little more time to see if more vaccines will be available in the future.

## 2019-09-09 ENCOUNTER — Other Ambulatory Visit: Payer: Self-pay | Admitting: Family Medicine

## 2019-09-22 ENCOUNTER — Other Ambulatory Visit: Payer: Self-pay | Admitting: Family Medicine

## 2019-09-22 DIAGNOSIS — I10 Essential (primary) hypertension: Secondary | ICD-10-CM

## 2019-09-22 NOTE — Telephone Encounter (Signed)
Needs appointment

## 2020-01-04 ENCOUNTER — Ambulatory Visit: Payer: Medicare HMO | Admitting: Family Medicine

## 2020-01-07 ENCOUNTER — Other Ambulatory Visit: Payer: Self-pay | Admitting: Family Medicine

## 2020-01-17 ENCOUNTER — Other Ambulatory Visit: Payer: Self-pay | Admitting: Family Medicine

## 2020-02-03 ENCOUNTER — Other Ambulatory Visit: Payer: Self-pay | Admitting: Family Medicine

## 2020-02-03 DIAGNOSIS — I10 Essential (primary) hypertension: Secondary | ICD-10-CM

## 2020-02-15 ENCOUNTER — Other Ambulatory Visit: Payer: Self-pay

## 2020-02-15 ENCOUNTER — Encounter: Payer: Self-pay | Admitting: Family Medicine

## 2020-02-15 ENCOUNTER — Ambulatory Visit (INDEPENDENT_AMBULATORY_CARE_PROVIDER_SITE_OTHER): Payer: Medicare HMO | Admitting: Family Medicine

## 2020-02-15 VITALS — BP 171/75 | HR 65 | Ht 62.0 in | Wt 119.0 lb

## 2020-02-15 DIAGNOSIS — E039 Hypothyroidism, unspecified: Secondary | ICD-10-CM | POA: Diagnosis not present

## 2020-02-15 DIAGNOSIS — I10 Essential (primary) hypertension: Secondary | ICD-10-CM

## 2020-02-15 DIAGNOSIS — R7301 Impaired fasting glucose: Secondary | ICD-10-CM

## 2020-02-15 DIAGNOSIS — Z8673 Personal history of transient ischemic attack (TIA), and cerebral infarction without residual deficits: Secondary | ICD-10-CM | POA: Diagnosis not present

## 2020-02-15 MED ORDER — ATORVASTATIN CALCIUM 80 MG PO TABS: 80.0000 mg | ORAL_TABLET | Freq: Every day | ORAL | 3 refills | Status: DC

## 2020-02-15 NOTE — Assessment & Plan Note (Signed)
Pressure is not well controlled today.  Look great when I saw her a year ago.  But she felt a little stressed this morning because she thought she was late for her appointment.  So we discussed coming back in about 2 weeks to recheck that pressure I will make sure that her high blood pressure has not been gradually increasing over the last year and that we are controlling her symptoms well.  We may need to consider increasing the metoprolol.  She is overdue for labs we will go ahead and get that updated today as far as checking renal function potassium etc.

## 2020-02-15 NOTE — Assessment & Plan Note (Addendum)
New for hemoglobin A1c today.  We discussed some strategies particularly around cutting back on sweet beverages like sweet tea and lemonade that she drinks pretty frequently.  She does not drink soda.  And also cutting back on carbs.  Gust focusing on healthy foods that have higher calories such as nuts etc.

## 2020-02-15 NOTE — Patient Instructions (Signed)
Please work on cutting back on sugars in your diet especially in beverages and snacks.  Also watch her portion control on your carbs such as her crackers, rice, bread etc.

## 2020-02-15 NOTE — Assessment & Plan Note (Signed)
He is actually doing well.  She has no significant residual symptoms.  Continue with daily aspirin and statin.  Due to recheck lipids and liver enzymes.

## 2020-02-15 NOTE — Assessment & Plan Note (Signed)
Feels like her symptoms are well controlled but has been struggling to maintain a weight of 115 pounds so plan to recheck TSH today.  Otherwise continue with current regimen.

## 2020-02-15 NOTE — Progress Notes (Signed)
Established Patient Office Visit  Subjective:  Patient ID: Kathleen [name], female    DOB: December 26, 1940  Age: 79 y.o. MRN: [id number]  CC:  Chief Complaint  Patient presents with  . Follow-up    HPI Kathleen [name] presents for   Hypertension- Pt denies chest pain, SOB, dizziness, or heart palpitations.  Taking meds as directed w/o problems.  Denies medication side effects.  She says she is really been trying to eat a low-salt diet.  Impaired fasting glucose-no increased thirst or urination. No symptoms consistent with hypoglycemia.  F/U hx stroke - she is doing well.  She still working full-time 40 hours/week.  She says she likes being out of the house and working but sometimes it is hard work.  Hypothyroidism - Taking medication regularly in the AM away from food and vitamins, etc. No recent change to skin, hair, or energy levels.  She says her weight dropped under 115 pounds and she was not happy with that so she started drinking protein drinks and feels like that is really been helpful.  She also feels like it has been helping with her energy level.   Past Medical History:  Diagnosis Date  . Cancer ([place])    Hx RT breast infiltrated ductal CA previously on tamoxifen and armidex   . Herpes simplex    lt eye  . Hypertension   . Kidney stones    history  . MVP (mitral valve prolapse)    asymptomatic  . Personal history of chemotherapy   . Personal history of radiation therapy     Past Surgical History:  Procedure Laterality Date  . ABDOMINAL HYSTERECTOMY     partial  . BREAST BIOPSY    . BREAST LUMPECTOMY Right 2000   RT  . CERVICAL LAMINECTOMY  1986  . SHOULDER SURGERY     rotator cuff repair  . throidectomy  1988   For cancer     Family History  Problem Relation Age of Onset  . Breast cancer Mother     Social History   Socioeconomic History  . Marital status: Married    Spouse name: Not on file  . Number of children: Not on file  . Years  of education: Not on file  . Highest education level: Not on file  Occupational History  . Not on file  Tobacco Use  . Smoking status: Never Smoker  . Smokeless tobacco: Never Used  Substance and Sexual Activity  . Alcohol use: No  . Drug use: Not on file  . 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  Other Topics Concern  . Not on file  Social History Narrative  . Not on file   Social Determinants of Health   Financial Resource Strain:   . Difficulty of Paying Living Expenses:   Food Insecurity:   . Worried About Charity fundraiser in the Last Year:   . Arboriculturist in the Last Year:   Transportation Needs:   . Film/video editor (Medical):   Marland Kitchen Lack of Transportation (Non-Medical):   Physical Activity:   . Days of Exercise per Week:   . Minutes of Exercise per Session:   Stress:   . Feeling of Stress :   Social Connections:   . Frequency of Communication with Friends and Family:   . Frequency of Social Gatherings with  Friends and Family:   . Attends Religious Services:   . Active Member of Clubs or Organizations:   . Attends Archivist Meetings:   Marland Kitchen Marital Status:   Intimate Partner Violence:   . Fear of Current or Ex-Partner:   . Emotionally Abused:   Marland Kitchen Physically Abused:   . Sexually Abused:     Outpatient Medications Prior to Visit  Medication Sig Dispense Refill  . Ascorbic Acid (VITAMIN C) 100 MG tablet Take 100 mg by mouth daily.    Marland Kitchen aspirin EC 81 MG tablet Take 81 mg by mouth daily.    . cholecalciferol (VITAMIN D3) 25 MCG (1000 UNIT) tablet Take 2,000 Units by mouth daily.    . Coenzyme Q10 (COQ10 PO) Take by mouth.    . DHA-EPA-Vit B6-B12-Folic Acid (CARDIOVID PLUS PO) Take by mouth.    . levothyroxine (SYNTHROID) 75 MCG tablet TAKE ONE (1) TABLET BY MOUTH EACH DAY 90 tablet 2  . metoprolol succinate (TOPROL-XL) 25 MG  24 hr tablet TAKE 1 TABLET BY MOUTH AT BEDTIME 90 tablet 0  . Omega-3 Fatty Acids (OMEGA 3 500 PO) Take by mouth.    . TURMERIC PO Take by mouth.    . valACYclovir (VALTREX) 500 MG tablet TAKE ONE (1) TABLET BY MOUTH EACH DAY 30 tablet 6  . Zinc Sulfate (ZINC 15 PO) Take by mouth.    Marland Kitchen atorvastatin (LIPITOR) 80 MG tablet Take 1 tablet (80 mg total) by mouth daily. At 8:00 AM. **PATIENT NEEDS OFFICE VISIT FOR ADDITIONAL REFILLS** 30 tablet 0   No facility-administered medications prior to visit.    Allergies  Allergen Reactions  . Streptomycin Other (See Comments)    Passed out  . Amlodipine Other (See Comments)    Dizziness on 5mg    . Codeine     Other reaction(s): Other (See Comments)  . Penicillins   . Sulfonamide Derivatives   . Sulfur     Other reaction(s): Other (See Comments) Pt not sure  . Benicar [Olmesartan] Other (See Comments)    Diizzy  . Hctz [Hydrochlorothiazide] Other (See Comments)    Vertigo, dizziness.   . Lisinopril Other (See Comments)    lightheaded    ROS Review of Systems    Objective:    Physical Exam Constitutional:      Appearance: She is well-developed.  HENT:     Head: Normocephalic and atraumatic.  Eyes:     Conjunctiva/sclera: Conjunctivae normal.  Cardiovascular:     Rate and Rhythm: Normal rate and regular rhythm.     Heart sounds: Normal heart sounds.  Pulmonary:     Effort: Pulmonary effort is normal.     Breath sounds: Normal breath sounds.  Skin:    General: Skin is warm and dry.  Neurological:     Mental Status: She is alert and oriented to person, place, and time.  Psychiatric:        Behavior: Behavior normal.     BP (!) 171/75   Pulse 65   Ht 5\' 2"  (1.575 m)   Wt 119 lb (54 kg)   SpO2 100%   BMI 21.77 kg/m  Wt Readings from Last 3 Encounters:  02/15/20 119 lb (54 kg)  01/26/19 115 lb (52.2 kg)  11/12/18 115 lb 1.6 oz (52.2 kg)     There are no preventive care reminders to display for this  patient.  There are no preventive care reminders to display for this patient.  Lab Results  Component  Value Date   TSH 1.25 02/02/2019   Lab Results  Component Value Date   WBC 5.9 02/05/2018   HGB 13.8 02/05/2018   HCT 41.3 02/05/2018   MCV 91.8 02/05/2018   PLT 229 02/05/2018   Lab Results  Component Value Date   NA 141 09/29/2018   K 3.8 09/29/2018   CO2 27 09/29/2018   GLUCOSE 94 09/29/2018   BUN 20 09/29/2018   CREATININE 0.67 09/29/2018   BILITOT 1.0 09/29/2018   ALKPHOS 58 02/23/2017   AST 26 09/29/2018   ALT 20 09/29/2018   PROT 6.2 09/29/2018   ALBUMIN 4.0 02/23/2017   CALCIUM 9.5 09/29/2018   Lab Results  Component Value Date   CHOL 159 09/29/2018   Lab Results  Component Value Date   HDL 53 09/29/2018   Lab Results  Component Value Date   LDLCALC 94 09/29/2018   Lab Results  Component Value Date   TRIG 41 09/29/2018   Lab Results  Component Value Date   CHOLHDL 3.0 09/29/2018   Lab Results  Component Value Date   HGBA1C 5.8 (H) 02/02/2019      Assessment & Plan:   Problem List Items Addressed This Visit      Cardiovascular and Mediastinum   Essential hypertension - Primary    Pressure is not well controlled today.  Look great when I saw her a year ago.  But she felt a little stressed this morning because she thought she was late for her appointment.  So we discussed coming back in about 2 weeks to recheck that pressure I will make sure that her high blood pressure has not been gradually increasing over the last year and that we are controlling her symptoms well.  We may need to consider increasing the metoprolol.  She is overdue for labs we will go ahead and get that updated today as far as checking renal function potassium etc.      Relevant Medications   atorvastatin (LIPITOR) 80 MG tablet   Other Relevant Orders   CBC   COMPLETE METABOLIC PANEL WITH GFR   Lipid panel     Endocrine   IMPAIRED FASTING GLUCOSE    New for  hemoglobin A1c today.  We discussed some strategies particularly around cutting back on sweet beverages like sweet tea and lemonade that she drinks pretty frequently.  She does not drink soda.  And also cutting back on carbs.  Gust focusing on healthy foods that have higher calories such as nuts etc.      Relevant Orders   CBC   COMPLETE METABOLIC PANEL WITH GFR   Lipid panel   Hemoglobin A1c   Hypothyroidism    Feels like her symptoms are well controlled but has been struggling to maintain a weight of 115 pounds so plan to recheck TSH today.  Otherwise continue with current regimen.        Other   S/P stroke due to cerebrovascular disease    He is actually doing well.  She has no significant residual symptoms.  Continue with daily aspirin and statin.  Due to recheck lipids and liver enzymes.      Relevant Medications   atorvastatin (LIPITOR) 80 MG tablet   Other Relevant Orders   CBC   COMPLETE METABOLIC PANEL WITH GFR   Lipid panel      Meds ordered this encounter  Medications  . atorvastatin (LIPITOR) 80 MG tablet    Sig: Take 1 tablet (80 mg total)  by mouth daily.    Dispense:  90 tablet    Refill:  3    Follow-up: Return in about 6 months (around 08/17/2020) for Hypertension and pre diabetes .    Beatrice Lecher, MD

## 2020-02-16 LAB — HEMOGLOBIN A1C
Hgb A1c MFr Bld: 5.7 % of total Hgb — ABNORMAL HIGH (ref <5.7)
Mean Plasma Glucose: 117 (calc)
eAG (mmol/L): 6.5 (calc)

## 2020-02-16 LAB — CBC
HCT: 39.5 % (ref 35.0–45.0)
Hemoglobin: 13.2 g/dL (ref 11.7–15.5)
MCH: 31 pg (ref 27.0–33.0)
MCHC: 33.4 g/dL (ref 32.0–36.0)
MCV: 92.7 fL (ref 80.0–100.0)
MPV: 11.1 fL (ref 7.5–12.5)
Platelets: 249 Thousand/uL (ref 140–400)
RBC: 4.26 Million/uL (ref 3.80–5.10)
RDW: 11.8 % (ref 11.0–15.0)
WBC: 6.9 Thousand/uL (ref 3.8–10.8)

## 2020-02-16 LAB — COMPLETE METABOLIC PANEL WITHOUT GFR
AG Ratio: 1.7 (calc) (ref 1.0–2.5)
ALT: 21 U/L (ref 6–29)
AST: 29 U/L (ref 10–35)
Albumin: 4 g/dL (ref 3.6–5.1)
Alkaline phosphatase (APISO): 59 U/L (ref 37–153)
BUN: 23 mg/dL (ref 7–25)
CO2: 29 mmol/L (ref 20–32)
Calcium: 9.1 mg/dL (ref 8.6–10.4)
Chloride: 105 mmol/L (ref 98–110)
Creat: 0.76 mg/dL (ref 0.60–0.88)
GFR, Est African American: 86 mL/min/1.73m2
GFR, Est Non African American: 74 mL/min/1.73m2
Globulin: 2.3 g/dL (ref 1.9–3.7)
Glucose, Bld: 98 mg/dL (ref 65–99)
Potassium: 3.9 mmol/L (ref 3.5–5.3)
Sodium: 140 mmol/L (ref 135–146)
Total Bilirubin: 0.6 mg/dL (ref 0.2–1.2)
Total Protein: 6.3 g/dL (ref 6.1–8.1)

## 2020-02-16 LAB — LIPID PANEL
Cholesterol: 146 mg/dL (ref <200)
HDL: 53 mg/dL (ref >=50)
LDL Cholesterol (Calc): 82 mg/dL (calc)
Non-HDL Cholesterol (Calc): 93 mg/dL (calc) (ref <130)
Total CHOL/HDL Ratio: 2.8 (calc) (ref <5.0)
Triglycerides: 38 mg/dL (ref <150)

## 2020-02-28 DIAGNOSIS — L905 Scar conditions and fibrosis of skin: Secondary | ICD-10-CM | POA: Diagnosis not present

## 2020-02-28 DIAGNOSIS — L821 Other seborrheic keratosis: Secondary | ICD-10-CM | POA: Diagnosis not present

## 2020-02-28 DIAGNOSIS — Q828 Other specified congenital malformations of skin: Secondary | ICD-10-CM | POA: Diagnosis not present

## 2020-02-28 DIAGNOSIS — D485 Neoplasm of uncertain behavior of skin: Secondary | ICD-10-CM | POA: Diagnosis not present

## 2020-02-28 DIAGNOSIS — C44319 Basal cell carcinoma of skin of other parts of face: Secondary | ICD-10-CM | POA: Diagnosis not present

## 2020-02-28 DIAGNOSIS — L570 Actinic keratosis: Secondary | ICD-10-CM | POA: Diagnosis not present

## 2020-02-28 DIAGNOSIS — Z85828 Personal history of other malignant neoplasm of skin: Secondary | ICD-10-CM | POA: Diagnosis not present

## 2020-02-28 DIAGNOSIS — M7981 Nontraumatic hematoma of soft tissue: Secondary | ICD-10-CM | POA: Diagnosis not present

## 2020-02-29 ENCOUNTER — Ambulatory Visit: Payer: Medicare HMO

## 2020-03-22 DIAGNOSIS — C44319 Basal cell carcinoma of skin of other parts of face: Secondary | ICD-10-CM | POA: Diagnosis not present

## 2020-03-29 ENCOUNTER — Other Ambulatory Visit: Payer: Self-pay | Admitting: *Deleted

## 2020-03-29 MED ORDER — LEVOTHYROXINE SODIUM 75 MCG PO TABS: ORAL_TABLET | ORAL | 2 refills | Status: DC

## 2020-04-05 DIAGNOSIS — C44319 Basal cell carcinoma of skin of other parts of face: Secondary | ICD-10-CM | POA: Diagnosis not present

## 2020-04-26 DIAGNOSIS — S61402A Unspecified open wound of left hand, initial encounter: Secondary | ICD-10-CM | POA: Diagnosis not present

## 2020-04-26 DIAGNOSIS — S61432A Puncture wound without foreign body of left hand, initial encounter: Secondary | ICD-10-CM | POA: Diagnosis not present

## 2020-04-26 DIAGNOSIS — W548XXA Other contact with dog, initial encounter: Secondary | ICD-10-CM | POA: Diagnosis not present

## 2020-04-27 ENCOUNTER — Encounter: Payer: Self-pay | Admitting: Family Medicine

## 2020-04-27 ENCOUNTER — Other Ambulatory Visit: Payer: Self-pay

## 2020-04-27 ENCOUNTER — Ambulatory Visit (INDEPENDENT_AMBULATORY_CARE_PROVIDER_SITE_OTHER): Payer: Medicare HMO | Admitting: Family Medicine

## 2020-04-27 VITALS — BP 191/86 | HR 82 | Wt 118.0 lb

## 2020-04-27 DIAGNOSIS — M7989 Other specified soft tissue disorders: Secondary | ICD-10-CM

## 2020-04-27 DIAGNOSIS — S61412A Laceration without foreign body of left hand, initial encounter: Secondary | ICD-10-CM

## 2020-04-27 DIAGNOSIS — S60449A External constriction of unspecified finger, initial encounter: Secondary | ICD-10-CM | POA: Diagnosis not present

## 2020-04-27 DIAGNOSIS — W4904XA Ring or other jewelry causing external constriction, initial encounter: Secondary | ICD-10-CM | POA: Diagnosis not present

## 2020-04-27 DIAGNOSIS — M25442 Effusion, left hand: Secondary | ICD-10-CM

## 2020-04-27 DIAGNOSIS — W548XXA Other contact with dog, initial encounter: Secondary | ICD-10-CM | POA: Diagnosis not present
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
## 2020-04-30 ENCOUNTER — Telehealth: Payer: Self-pay | Admitting: Family Medicine

## 2020-04-30 NOTE — Telephone Encounter (Signed)
Spoke with pt about the work note that she was requesting for being out. She stated that she worked on Thursday 04/26/2020(day of the incident). She was off on Friday 04/27/2020, and was scheduled to work on Saturday 04/28/2020. She stated that she did not go into work on Saturday due to her injury. She asked if she could get an excuse that would keep her out thru until the 18th. She stated that she would feel more confident at that time to return.  I advised her that she would possibly need to have FMLA forms to be out this long. However, I will pass this along to Dr. Madilyn Fireman for advice.

## 2020-04-30 NOTE — Telephone Encounter (Signed)
Yes ok for work note through 18th.  Will need FMLA to protect her job.

## 2020-04-30 NOTE — Telephone Encounter (Signed)
Pt was seen for her hand but needs a note stating she can return to work in one week cause she will be finished with her medicine then. She states she is coming with her husband to his appt tomorrow 05/01/20 and can get the note then while here.

## 2020-05-01 ENCOUNTER — Other Ambulatory Visit: Payer: Self-pay

## 2020-05-01 ENCOUNTER — Encounter: Payer: Self-pay | Admitting: *Deleted

## 2020-05-01 ENCOUNTER — Ambulatory Visit (INDEPENDENT_AMBULATORY_CARE_PROVIDER_SITE_OTHER): Payer: Medicare HMO | Admitting: Family Medicine

## 2020-05-01 DIAGNOSIS — Z23 Encounter for immunization: Secondary | ICD-10-CM | POA: Diagnosis not present

## 2020-05-01 NOTE — Telephone Encounter (Signed)
Letter written to give to pt today.

## 2020-05-08 ENCOUNTER — Other Ambulatory Visit: Payer: Self-pay | Admitting: Family Medicine

## 2020-05-08 DIAGNOSIS — I10 Essential (primary) hypertension: Secondary | ICD-10-CM

## 2020-05-30 DIAGNOSIS — L989 Disorder of the skin and subcutaneous tissue, unspecified: Secondary | ICD-10-CM | POA: Diagnosis not present

## 2020-05-30 DIAGNOSIS — D1801 Hemangioma of skin and subcutaneous tissue: Secondary | ICD-10-CM | POA: Diagnosis not present

## 2020-05-30 DIAGNOSIS — D485 Neoplasm of uncertain behavior of skin: Secondary | ICD-10-CM | POA: Diagnosis not present

## 2020-05-30 DIAGNOSIS — Z85828 Personal history of other malignant neoplasm of skin: Secondary | ICD-10-CM | POA: Diagnosis not present

## 2020-05-30 DIAGNOSIS — L821 Other seborrheic keratosis: Secondary | ICD-10-CM | POA: Diagnosis not present

## 2020-05-30 DIAGNOSIS — D225 Melanocytic nevi of trunk: Secondary | ICD-10-CM | POA: Diagnosis not present

## 2020-05-30 DIAGNOSIS — L905 Scar conditions and fibrosis of skin: Secondary | ICD-10-CM | POA: Diagnosis not present

## 2020-05-30 DIAGNOSIS — C44519 Basal cell carcinoma of skin of other part of trunk: Secondary | ICD-10-CM | POA: Diagnosis not present

## 2020-05-30 DIAGNOSIS — L570 Actinic keratosis: Secondary | ICD-10-CM | POA: Diagnosis not present

## 2020-06-04 DIAGNOSIS — L814 Other melanin hyperpigmentation: Secondary | ICD-10-CM | POA: Diagnosis not present

## 2020-06-04 DIAGNOSIS — L308 Other specified dermatitis: Secondary | ICD-10-CM | POA: Diagnosis not present

## 2020-07-05 ENCOUNTER — Other Ambulatory Visit: Payer: Self-pay | Admitting: Family Medicine

## 2020-07-05 DIAGNOSIS — Z0000 Encounter for general adult medical examination without abnormal findings: Secondary | ICD-10-CM

## 2020-07-06 DIAGNOSIS — L905 Scar conditions and fibrosis of skin: Secondary | ICD-10-CM | POA: Diagnosis not present

## 2020-07-06 DIAGNOSIS — D485 Neoplasm of uncertain behavior of skin: Secondary | ICD-10-CM | POA: Diagnosis not present

## 2020-07-06 DIAGNOSIS — C44629 Squamous cell carcinoma of skin of left upper limb, including shoulder: Secondary | ICD-10-CM | POA: Diagnosis not present

## 2020-08-15 ENCOUNTER — Ambulatory Visit: Payer: Medicare HMO | Admitting: Family Medicine

## 2020-08-15 NOTE — Progress Notes (Deleted)
Established Patient Office Visit  Subjective:  Patient ID: Kathleen [name], female    DOB: 01-17-1941  Age: 80 y.o. MRN: [id number]  CC: No chief complaint on file.   HPI Kathleen [name] presents for  Impaired fasting glucose-no increased thirst or urination. No symptoms consistent with hypoglycemia.  Hypertension- Pt denies chest pain, SOB, dizziness, or heart palpitations.  Taking meds as directed w/o problems.  Denies medication side effects.    Hypothyroidism - Taking medication regularly in the AM away from food and vitamins, etc. No recent change to skin, hair, or energy levels.   Past Medical History:  Diagnosis Date  . Cancer ([place])    Hx RT breast infiltrated ductal CA previously on tamoxifen and armidex   . Herpes simplex    lt eye  . Hypertension   . Kidney stones    history  . MVP (mitral valve prolapse)    asymptomatic  . Personal history of chemotherapy   . Personal history of radiation therapy     Past Surgical History:  Procedure Laterality Date  . ABDOMINAL HYSTERECTOMY     partial  . BREAST BIOPSY    . BREAST LUMPECTOMY Right 2000   RT  . CERVICAL LAMINECTOMY  1986  . SHOULDER SURGERY     rotator cuff repair  . throidectomy  1988   For cancer     Family History  Problem Relation Age of Onset  . Breast cancer Mother     Social History   Socioeconomic History  . Marital status: Married    Spouse name: Not on file  . Number of children: Not on file  . Years of education: Not on file  . Highest education level: Not on file  Occupational History  . Not on file  Tobacco Use  . Smoking status: Never Smoker  . Smokeless tobacco: Never Used  Substance and Sexual Activity  . Alcohol use: No  . Drug use: Not on file  . 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  Other Topics Concern  . Not on file  Social  History Narrative  . Not on file   Social Determinants of Health   Financial Resource Strain: Not on file  Food Insecurity: Not on file  Transportation Needs: Not on file  Physical Activity: Not on file  Stress: Not on file  Social Connections: Not on file  Intimate Partner Violence: Not on file    Outpatient Medications Prior to Visit  Medication Sig Dispense Refill  . Ascorbic Acid (VITAMIN C) 100 MG tablet Take 100 mg by mouth daily.    Marland Kitchen aspirin EC 81 MG tablet Take 81 mg by mouth daily.    Marland Kitchen atorvastatin (LIPITOR) 80 MG tablet Take 1 tablet (80 mg total) by mouth daily. 90 tablet 3  . cholecalciferol (VITAMIN D3) 25 MCG (1000 UNIT) tablet Take 2,000 Units by mouth daily.    . Coenzyme Q10 (COQ10 PO) Take by mouth.    . DHA-EPA-Vit B6-B12-Folic Acid (CARDIOVID PLUS PO) Take by mouth.    . levothyroxine (SYNTHROID) 75 MCG tablet TAKE ONE (1) TABLET BY MOUTH EACH DAY 90 tablet 2  . metoprolol succinate (TOPROL-XL) 25 MG 24 hr tablet TAKE 1 TABLET BY MOUTH AT BEDTIME 90 tablet 0  . Omega-3 Fatty Acids (OMEGA 3 500 PO) Take by mouth.    . TURMERIC PO  Take by mouth.    . valACYclovir (VALTREX) 500 MG tablet TAKE ONE (1) TABLET BY MOUTH EACH DAY 30 tablet 6  . Zinc Sulfate (ZINC 15 PO) Take by mouth.     No facility-administered medications prior to visit.    Allergies  Allergen Reactions  . Streptomycin Other (See Comments)    Passed out  . Amlodipine Other (See Comments)    Dizziness on 5mg    . Codeine     Other reaction(s): Other (See Comments)  . Elemental Sulfur     Other reaction(s): Other (See Comments) Pt not sure  . Penicillins   . Sulfonamide Derivatives   . Benicar [Olmesartan] Other (See Comments)    Diizzy  . Hctz [Hydrochlorothiazide] Other (See Comments)    Vertigo, dizziness.   . Lisinopril Other (See Comments)    lightheaded    ROS Review of Systems    Objective:    Physical Exam  There were no vitals taken for this visit. Wt Readings from  Last 3 Encounters:  04/27/20 118 lb (53.5 kg)  02/15/20 119 lb (54 kg)  01/26/19 115 lb (52.2 kg)     Health Maintenance Due  Topic Date Due  . COVID-19 Vaccine (3 - Booster for Pfizer series) 04/15/2020  . MAMMOGRAM  07/19/2020    There are no preventive care reminders to display for this patient.  Lab Results  Component Value Date   TSH 1.25 02/02/2019   Lab Results  Component Value Date   WBC 6.9 02/15/2020   HGB 13.2 02/15/2020   HCT 39.5 02/15/2020   MCV 92.7 02/15/2020   PLT 249 02/15/2020   Lab Results  Component Value Date   NA 140 02/15/2020   K 3.9 02/15/2020   CO2 29 02/15/2020   GLUCOSE 98 02/15/2020   BUN 23 02/15/2020   CREATININE 0.76 02/15/2020   BILITOT 0.6 02/15/2020   ALKPHOS 58 02/23/2017   AST 29 02/15/2020   ALT 21 02/15/2020   PROT 6.3 02/15/2020   ALBUMIN 4.0 02/23/2017   CALCIUM 9.1 02/15/2020   Lab Results  Component Value Date   CHOL 146 02/15/2020   Lab Results  Component Value Date   HDL 53 02/15/2020   Lab Results  Component Value Date   LDLCALC 82 02/15/2020   Lab Results  Component Value Date   TRIG 38 02/15/2020   Lab Results  Component Value Date   CHOLHDL 2.8 02/15/2020   Lab Results  Component Value Date   HGBA1C 5.7 (H) 02/15/2020      Assessment & Plan:   Problem List Items Addressed This Visit      Cardiovascular and Mediastinum   Essential hypertension - Primary     Endocrine   IMPAIRED FASTING GLUCOSE   Hypothyroidism      No orders of the defined types were placed in this encounter.   Follow-up: No follow-ups on file.    Beatrice Lecher, MD

## 2020-08-20 DIAGNOSIS — C44629 Squamous cell carcinoma of skin of left upper limb, including shoulder: Secondary | ICD-10-CM | POA: Diagnosis not present

## 2020-08-23 ENCOUNTER — Other Ambulatory Visit: Payer: Self-pay

## 2020-08-23 ENCOUNTER — Ambulatory Visit
Admission: RE | Admit: 2020-08-23 | Discharge: 2020-08-23 | Disposition: A | Discharge status: Home / Self Care | Payer: Medicare HMO | Source: Ambulatory Visit | Attending: Family Medicine | Admitting: Family Medicine

## 2020-08-23 DIAGNOSIS — C44519 Basal cell carcinoma of skin of other part of trunk: Secondary | ICD-10-CM | POA: Diagnosis not present

## 2020-08-23 DIAGNOSIS — Z1231 Encounter for screening mammogram for malignant neoplasm of breast: Secondary | ICD-10-CM | POA: Diagnosis not present

## 2020-08-23 DIAGNOSIS — Z0000 Encounter for general adult medical examination without abnormal findings: Secondary | ICD-10-CM

## 2020-09-13 ENCOUNTER — Other Ambulatory Visit: Payer: Self-pay | Admitting: Family Medicine

## 2020-09-13 DIAGNOSIS — I10 Essential (primary) hypertension: Secondary | ICD-10-CM

## 2020-09-13 NOTE — Telephone Encounter (Signed)
Please call pt and get her scheduled for f/u for bp and medication refills

## 2020-09-14 NOTE — Telephone Encounter (Signed)
It looks like it was sent yesterday.

## 2020-09-14 NOTE — Telephone Encounter (Signed)
Patient has been scheduled for 09/27/2020 and wanted to know if she can have a refill up until her appt date as she is completely out of medication. AM

## 2020-09-27 ENCOUNTER — Encounter: Payer: Self-pay | Admitting: Family Medicine

## 2020-09-27 ENCOUNTER — Ambulatory Visit (INDEPENDENT_AMBULATORY_CARE_PROVIDER_SITE_OTHER): Payer: Medicare HMO | Admitting: Family Medicine

## 2020-09-27 ENCOUNTER — Other Ambulatory Visit: Payer: Self-pay

## 2020-09-27 VITALS — BP 142/84 | HR 66 | Ht 62.0 in | Wt 123.0 lb

## 2020-09-27 DIAGNOSIS — R7301 Impaired fasting glucose: Secondary | ICD-10-CM | POA: Diagnosis not present

## 2020-09-27 DIAGNOSIS — I10 Essential (primary) hypertension: Secondary | ICD-10-CM | POA: Diagnosis not present

## 2020-09-27 DIAGNOSIS — E039 Hypothyroidism, unspecified: Secondary | ICD-10-CM | POA: Diagnosis not present

## 2020-09-27 DIAGNOSIS — J358 Other chronic diseases of tonsils and adenoids: Secondary | ICD-10-CM | POA: Diagnosis not present

## 2020-09-27 NOTE — Assessment & Plan Note (Signed)
Blood pressures still not quite where I would like to see it today but she does have whitecoat hypertension so it is always extremely high when she first comes in.  In fact it dropped over 40 points just after having her sit for a few minutes.  Just really encouraged her to track her pressures at home as I do feel like those are probably the most accurate and reminded her about the increased risk for repeat stroke with uncontrolled hypertension.

## 2020-09-27 NOTE — Assessment & Plan Note (Signed)
Feels like everything is stable with her thyroid.  We will plan to recheck TSH.

## 2020-09-27 NOTE — Progress Notes (Signed)
Established Patient Office Visit  Subjective:  Patient ID: Kathleen [name], female    DOB: 06/27/1941  Age: 80 y.o. MRN: [id number]  CC:  Chief Complaint  Patient presents with  . Hypertension    HPI Kathleen [name] presents for   Hypertension- Pt denies chest pain, SOB, dizziness, or heart palpitations.  Taking meds as directed w/o problems.  Denies medication side effects.  Ports that she does check her blood pressure at home intermittently though she is not able to tell me exactly what pressure she gets is getting but says that they are normal though she recently lost her blood pressure cuff to her daughter.  Impaired fasting glucose-no increased thirst or urination. No symptoms consistent with hypoglycemia.  White spots on tonsils and would like a referral.   He has had a squamous cell removed off the back of her left hand it still little bit swollen and she is just worried about getting some new skin lesions she knows she has some lesions on her face that need to be treated as well.  Hypothyroidism - Taking medication regularly in the AM away from food and vitamins, etc. No recent change to skin, hair, or energy levels.   Past Medical History:  Diagnosis Date  . Cancer ([place])    Hx RT breast infiltrated ductal CA previously on tamoxifen and armidex   . Herpes simplex    lt eye  . Hypertension   . Kidney stones    history  . MVP (mitral valve prolapse)    asymptomatic  . Personal history of chemotherapy   . Personal history of radiation therapy     Past Surgical History:  Procedure Laterality Date  . ABDOMINAL HYSTERECTOMY     partial  . BREAST BIOPSY    . BREAST LUMPECTOMY Right 2000   RT  . CERVICAL LAMINECTOMY  1986  . SHOULDER SURGERY     rotator cuff repair  . throidectomy  1988   For cancer     Family History  Problem Relation Age of Onset  . Breast cancer Mother     Social History   Socioeconomic History  . Marital status: Married    Spouse name:  Not on file  . Number of children: Not on file  . Years of education: Not on file  . Highest education level: Not on file  Occupational History  . Not on file  Tobacco Use  . Smoking status: Never Smoker  . Smokeless tobacco: Never Used  Substance and Sexual Activity  . Alcohol use: No  . Drug use: Not on file  . 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  Other Topics Concern  . Not on file  Social History Narrative  . Not on file   Social Determinants of Health   Financial Resource Strain: Not on file  Food Insecurity: Not on file  Transportation Needs: Not on file  Physical Activity: Not on file  Stress: Not on file  Social Connections: Not on file  Intimate Partner Violence: Not on file    Outpatient Medications Prior to Visit  Medication Sig Dispense Refill  . Ascorbic Acid (VITAMIN C) 100 MG tablet Take 100 mg by mouth daily.    Marland Kitchen aspirin EC 81 MG tablet Take 81 mg by mouth daily.    Marland Kitchen atorvastatin (LIPITOR) 80 MG tablet Take 1 tablet (80 mg  total) by mouth daily. 90 tablet 3  . cholecalciferol (VITAMIN D3) 25 MCG (1000 UNIT) tablet Take 4,000 Units by mouth daily.    . Coenzyme Q10 (COQ10 PO) Take by mouth.    . DHA-EPA-Vit B6-B12-Folic Acid (CARDIOVID PLUS PO) Take by mouth.    . Ferrous Sulfate (IRON PO) Take by mouth.    . levothyroxine (SYNTHROID) 75 MCG tablet TAKE ONE (1) TABLET BY MOUTH EACH DAY 90 tablet 2  . metoprolol succinate (TOPROL-XL) 25 MG 24 hr tablet TAKE 1 TABLET BY MOUTH AT BEDTIME 90 tablet 0  . Omega-3 Fatty Acids (OMEGA 3 500 PO) Take by mouth.    . TURMERIC PO Take by mouth.    . valACYclovir (VALTREX) 500 MG tablet TAKE ONE (1) TABLET BY MOUTH EACH DAY 30 tablet 6  . Zinc Sulfate (ZINC 15 PO) Take by mouth.     No facility-administered medications prior to visit.    Allergies  Allergen Reactions  . Streptomycin Other  (See Comments)    Passed out  . Amlodipine Other (See Comments)    Dizziness on 5mg    . Codeine     Other reaction(s): Other (See Comments)  . Elemental Sulfur     Other reaction(s): Other (See Comments) Pt not sure  . Penicillins   . Sulfonamide Derivatives   . Benicar [Olmesartan] Other (See Comments)    Diizzy  . Hctz [Hydrochlorothiazide] Other (See Comments)    Vertigo, dizziness.   . Lisinopril Other (See Comments)    lightheaded    ROS Review of Systems    Objective:    Physical Exam Constitutional:      Appearance: She is well-developed.  HENT:     Head: Normocephalic and atraumatic.     Mouth/Throat:     Comments: Tonsil stone on the right Cardiovascular:     Rate and Rhythm: Normal rate and regular rhythm.     Heart sounds: Normal heart sounds.  Pulmonary:     Effort: Pulmonary effort is normal.     Breath sounds: Normal breath sounds.  Skin:    General: Skin is warm and dry.  Neurological:     Mental Status: She is alert and oriented to person, place, and time.  Psychiatric:        Behavior: Behavior normal.     BP (!) 142/84   Pulse 66   Ht 5\' 2"  (1.575 m)   Wt 123 lb (55.8 kg)   SpO2 100%   BMI 22.50 kg/m  Wt Readings from Last 3 Encounters:  09/27/20 123 lb (55.8 kg)  04/27/20 118 lb (53.5 kg)  02/15/20 119 lb (54 kg)     There are no preventive care reminders to display for this patient.  There are no preventive care reminders to display for this patient.  Lab Results  Component Value Date   TSH 1.25 02/02/2019   Lab Results  Component Value Date   WBC 6.9 02/15/2020   HGB 13.2 02/15/2020   HCT 39.5 02/15/2020   MCV 92.7 02/15/2020   PLT 249 02/15/2020   Lab Results  Component Value Date   NA 140 02/15/2020   K 3.9 02/15/2020   CO2 29 02/15/2020   GLUCOSE 98 02/15/2020   BUN 23 02/15/2020   CREATININE 0.76 02/15/2020   BILITOT 0.6 02/15/2020   ALKPHOS 58 02/23/2017   AST 29 02/15/2020   ALT 21 02/15/2020   PROT  6.3 02/15/2020   ALBUMIN 4.0 02/23/2017   CALCIUM 9.1  02/15/2020   Lab Results  Component Value Date   CHOL 146 02/15/2020   Lab Results  Component Value Date   HDL 53 02/15/2020   Lab Results  Component Value Date   LDLCALC 82 02/15/2020   Lab Results  Component Value Date   TRIG 38 02/15/2020   Lab Results  Component Value Date   CHOLHDL 2.8 02/15/2020   Lab Results  Component Value Date   HGBA1C 5.7 (H) 02/15/2020      Assessment & Plan:   Problem List Items Addressed This Visit      Cardiovascular and Mediastinum   Essential hypertension - Primary    Blood pressures still not quite where I would like to see it today but she does have whitecoat hypertension so it is always extremely high when she first comes in.  In fact it dropped over 40 points just after having her sit for a few minutes.  Just really encouraged her to track her pressures at home as I do feel like those are probably the most accurate and reminded her about the increased risk for repeat stroke with uncontrolled hypertension.      Relevant Orders   BASIC METABOLIC PANEL WITH GFR   TSH   Hemoglobin A1c     Endocrine   IMPAIRED FASTING GLUCOSE    To recheck A1c.      Relevant Orders   BASIC METABOLIC PANEL WITH GFR   TSH   Hemoglobin A1c   Hypothyroidism    Feels like everything is stable with her thyroid.  We will plan to recheck TSH.      Relevant Orders   TSH    Other Visit Diagnoses    Tonsil stone         Tonsil Stones -gave reassurance these are benign she does not need referral recommend just gargling regularly and using a WaterPik if needed at times but to avoid picking at the areas and traumatizing the tonsillar tissue.  No orders of the defined types were placed in this encounter.   Follow-up: Return in about 6 months (around 03/30/2021) for Hypertension.    Beatrice Lecher, MD

## 2020-09-27 NOTE — Assessment & Plan Note (Signed)
To recheck A1c.

## 2020-09-28 LAB — HEMOGLOBIN A1C
Hgb A1c MFr Bld: 5.9 % of total Hgb — ABNORMAL HIGH (ref <5.7)
Mean Plasma Glucose: 123 mg/dL
eAG (mmol/L): 6.8 mmol/L

## 2020-09-28 LAB — BASIC METABOLIC PANEL WITH GFR
BUN: 21 mg/dL (ref 7–25)
CO2: 29 mmol/L (ref 20–32)
Calcium: 9.1 mg/dL (ref 8.6–10.4)
Chloride: 103 mmol/L (ref 98–110)
Creat: 0.64 mg/dL (ref 0.60–0.88)
GFR, Est African American: 98 mL/min/{1.73_m2} (ref >=60)
GFR, Est Non African American: 84 mL/min/{1.73_m2} (ref >=60)
Glucose, Bld: 97 mg/dL (ref 65–139)
Potassium: 3.8 mmol/L (ref 3.5–5.3)
Sodium: 140 mmol/L (ref 135–146)

## 2020-09-28 LAB — TSH: TSH: 1.33 mIU/L (ref 0.40–4.50)

## 2020-10-03 ENCOUNTER — Other Ambulatory Visit: Payer: Self-pay | Admitting: Family Medicine

## 2020-10-11 DIAGNOSIS — C44329 Squamous cell carcinoma of skin of other parts of face: Secondary | ICD-10-CM | POA: Diagnosis not present

## 2020-10-11 DIAGNOSIS — L439 Lichen planus, unspecified: Secondary | ICD-10-CM | POA: Diagnosis not present

## 2020-10-11 DIAGNOSIS — D485 Neoplasm of uncertain behavior of skin: Secondary | ICD-10-CM | POA: Diagnosis not present

## 2020-10-11 DIAGNOSIS — L570 Actinic keratosis: Secondary | ICD-10-CM | POA: Diagnosis not present

## 2020-11-05 ENCOUNTER — Ambulatory Visit: Payer: Self-pay

## 2020-11-21 DIAGNOSIS — D225 Melanocytic nevi of trunk: Secondary | ICD-10-CM | POA: Diagnosis not present

## 2020-11-21 DIAGNOSIS — L820 Inflamed seborrheic keratosis: Secondary | ICD-10-CM | POA: Diagnosis not present

## 2020-11-21 DIAGNOSIS — L814 Other melanin hyperpigmentation: Secondary | ICD-10-CM | POA: Diagnosis not present

## 2020-11-21 DIAGNOSIS — L905 Scar conditions and fibrosis of skin: Secondary | ICD-10-CM | POA: Diagnosis not present

## 2020-11-21 DIAGNOSIS — D485 Neoplasm of uncertain behavior of skin: Secondary | ICD-10-CM | POA: Diagnosis not present

## 2020-11-21 DIAGNOSIS — L821 Other seborrheic keratosis: Secondary | ICD-10-CM | POA: Diagnosis not present

## 2020-11-21 DIAGNOSIS — Z85828 Personal history of other malignant neoplasm of skin: Secondary | ICD-10-CM | POA: Diagnosis not present

## 2020-11-21 DIAGNOSIS — D1801 Hemangioma of skin and subcutaneous tissue: Secondary | ICD-10-CM | POA: Diagnosis not present

## 2020-11-21 DIAGNOSIS — L853 Xerosis cutis: Secondary | ICD-10-CM | POA: Diagnosis not present

## 2020-11-21 DIAGNOSIS — L570 Actinic keratosis: Secondary | ICD-10-CM | POA: Diagnosis not present

## 2020-12-10 ENCOUNTER — Telehealth: Payer: Self-pay | Admitting: *Deleted

## 2020-12-10 DIAGNOSIS — G5702 Lesion of sciatic nerve, left lower limb: Secondary | ICD-10-CM

## 2020-12-10 DIAGNOSIS — D0439 Carcinoma in situ of skin of other parts of face: Secondary | ICD-10-CM | POA: Diagnosis not present

## 2020-12-10 MED ORDER — IBUPROFEN 800 MG PO TABS: ORAL_TABLET | ORAL | 1 refills | Status: DC

## 2020-12-10 NOTE — Telephone Encounter (Signed)
Pt called asking for refill on IBU 800.  RF [facility].

## 2020-12-14 ENCOUNTER — Other Ambulatory Visit: Payer: Self-pay | Admitting: Family Medicine

## 2020-12-14 DIAGNOSIS — I10 Essential (primary) hypertension: Secondary | ICD-10-CM

## 2021-02-11 ENCOUNTER — Telehealth: Payer: Self-pay | Admitting: Family Medicine

## 2021-02-11 NOTE — Chronic Care Management (AMB) (Signed)
  Chronic Care Management   Outreach Note  02/11/2021 Name: Justene Hinchcliffe MRN: [id number] DOB: [birth year]  Referred by: Hali Marry, MD Reason for referral : No chief complaint on file.   An unsuccessful telephone outreach was attempted today. The patient was referred to the pharmacist for assistance with care management and care coordination.   Follow Up Plan:   Lauretta Grill Upstream Scheduler

## 2021-03-13 ENCOUNTER — Telehealth: Payer: Self-pay | Admitting: Lab

## 2021-03-13 NOTE — Progress Notes (Signed)
  Chronic Care Management   Outreach Note  03/13/2021 Name: Kathleen [name] MRN: [id number] DOB: 10-02-1940  Referred by: Hali Marry, MD Reason for referral : Medication Management   Third unsuccessful telephone outreach was attempted today. The patient was referred to the pharmacist for assistance with care management and care coordination.   Follow Up Plan:   [place]

## 2021-03-15 ENCOUNTER — Other Ambulatory Visit: Payer: Self-pay | Admitting: Family Medicine

## 2021-04-03 ENCOUNTER — Ambulatory Visit (INDEPENDENT_AMBULATORY_CARE_PROVIDER_SITE_OTHER): Payer: Medicare HMO | Admitting: Family Medicine

## 2021-04-03 ENCOUNTER — Encounter: Payer: Self-pay | Admitting: Family Medicine

## 2021-04-03 ENCOUNTER — Other Ambulatory Visit: Payer: Self-pay

## 2021-04-03 VITALS — BP 134/77 | HR 69 | Ht 62.0 in | Wt 119.0 lb

## 2021-04-03 DIAGNOSIS — Z23 Encounter for immunization: Secondary | ICD-10-CM | POA: Diagnosis not present

## 2021-04-03 DIAGNOSIS — E039 Hypothyroidism, unspecified: Secondary | ICD-10-CM

## 2021-04-03 DIAGNOSIS — E611 Iron deficiency: Secondary | ICD-10-CM | POA: Diagnosis not present

## 2021-04-03 DIAGNOSIS — I10 Essential (primary) hypertension: Secondary | ICD-10-CM | POA: Diagnosis not present

## 2021-04-03 DIAGNOSIS — R062 Wheezing: Secondary | ICD-10-CM

## 2021-04-03 DIAGNOSIS — Z8673 Personal history of transient ischemic attack (TIA), and cerebral infarction without residual deficits: Secondary | ICD-10-CM

## 2021-04-03 DIAGNOSIS — R7301 Impaired fasting glucose: Secondary | ICD-10-CM

## 2021-04-03 MED ORDER — ATORVASTATIN CALCIUM 80 MG PO TABS: 80.0000 mg | ORAL_TABLET | Freq: Every day | ORAL | 3 refills | Status: DC

## 2021-04-03 MED ORDER — VALACYCLOVIR HCL 500 MG PO TABS: ORAL_TABLET | ORAL | 6 refills | Status: DC

## 2021-04-03 MED ORDER — METOPROLOL SUCCINATE ER 25 MG PO TB24: 25.0000 mg | ORAL_TABLET | Freq: Every day | ORAL | 1 refills | Status: DC

## 2021-04-03 NOTE — Assessment & Plan Note (Signed)
Due to recheck A1C. She does a great job with diet and staying active.

## 2021-04-03 NOTE — Progress Notes (Signed)
Established Patient Office Visit  Subjective:  Patient ID: Kathleen [name], female    DOB: April 21, 1941  Age: 80 y.o. MRN: [id number]  CC:  Chief Complaint  Patient presents with   Hypertension   Hypothyroidism    HPI Kathleen [name] presents for   Hypertension- Pt denies chest pain, SOB, dizziness, or heart palpitations.  Taking meds as directed w/o problems.  Denies medication side effects.    Hypothyroidism - Taking medication regularly in the AM away from food and vitamins, etc. No recent change to skin, hair, or energy levels.  Hyperlipidemia - tolerating stating well with no myalgias or significant side effects.  Lab Results  Component Value Date   CHOL 146 02/15/2020   HDL 53 02/15/2020   LDLCALC 82 02/15/2020   TRIG 38 02/15/2020   CHOLHDL 2.8 02/15/2020    She is not currently taking her iron supplement but she is taking zinc, turmeric, vitamin C etc.  She has noticed some intermittent wheezing especially with really being more active or with lifting heavy things she has probably noticed it maybe in the last month or 2 no prior history of asthma or COPD.  Past Medical History:  Diagnosis Date   Cancer ([place])    Hx RT breast infiltrated ductal CA previously on tamoxifen and armidex    Herpes simplex    lt eye   Hypertension    Kidney stones    history   MVP (mitral valve prolapse)    asymptomatic   Personal history of chemotherapy    Personal history of radiation therapy     Past Surgical History:  Procedure Laterality Date   ABDOMINAL HYSTERECTOMY     partial   BREAST BIOPSY     BREAST LUMPECTOMY Right 2000   RT   CERVICAL LAMINECTOMY  1986   SHOULDER SURGERY     rotator cuff repair   throidectomy  1988   For cancer     Family History  Problem Relation Age of Onset   Breast cancer Mother     Social History   Socioeconomic History   Marital status: Married    Spouse name: Not on file   Number of children: Not on file   Years of education: Not  on file   Highest education level: Not on file  Occupational History   Not on file  Tobacco Use   Smoking status: Never   Smokeless tobacco: Never  Substance and Sexual Activity   Alcohol use: No   Drug use: Not on file   Sexual activity: Not on file    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  Other Topics Concern   Not on file  Social History Narrative   Not on file   Social Determinants of Health   Financial Resource Strain: Not on file  Food Insecurity: Not on file  Transportation Needs: Not on file  Physical Activity: Not on file  Stress: Not on file  Social Connections: Not on file  Intimate Partner Violence: Not on file    Outpatient Medications Prior to Visit  Medication Sig Dispense Refill   Ascorbic Acid (VITAMIN C) 100 MG tablet Take 100 mg by mouth daily.     aspirin EC 81 MG tablet Take 81 mg by mouth daily.     cholecalciferol (VITAMIN D3) 25 MCG (1000 UNIT) tablet Take 4,000 Units by mouth daily.     Coenzyme Q10 (COQ10  PO) Take by mouth.     DHA-EPA-Vit B6-B12-Folic Acid (CARDIOVID PLUS PO) Take by mouth.     Ferrous Sulfate (IRON PO) Take by mouth.     ibuprofen (ADVIL) 800 MG tablet TAKE ONE TABLET EVERY 8 HOURS AS NEEDED 60 tablet 1   levothyroxine (SYNTHROID) 75 MCG tablet TAKE ONE (1) TABLET BY MOUTH EACH DAY 90 tablet 2   Omega-3 Fatty Acids (OMEGA 3 500 PO) Take by mouth.     TURMERIC PO Take by mouth.     Zinc Sulfate (ZINC 15 PO) Take by mouth.     atorvastatin (LIPITOR) 80 MG tablet Take 1 tablet (80 mg total) by mouth daily. 90 tablet 3   metoprolol succinate (TOPROL-XL) 25 MG 24 hr tablet TAKE 1 TABLET BY MOUTH AT BEDTIME 90 tablet 0   valACYclovir (VALTREX) 500 MG tablet TAKE ONE (1) TABLET BY MOUTH EACH DAY 30 tablet 6   No facility-administered medications prior to visit.    Allergies  Allergen Reactions   Streptomycin Other (See Comments)    Passed  out   Amlodipine Other (See Comments)    Dizziness on '5mg'$     Codeine     Other reaction(s): Other (See Comments)   Elemental Sulfur     Other reaction(s): Other (See Comments) Pt not sure   Penicillins    Sulfa Antibiotics    Sulfonamide Derivatives    Benicar [Olmesartan] Other (See Comments)    Diizzy   Hctz [Hydrochlorothiazide] Other (See Comments)    Vertigo, dizziness.    Lisinopril Other (See Comments)    lightheaded    ROS Review of Systems    Objective:    Physical Exam Constitutional:      Appearance: Normal appearance. She is well-developed.  HENT:     Head: Normocephalic and atraumatic.  Cardiovascular:     Rate and Rhythm: Normal rate and regular rhythm.     Heart sounds: Normal heart sounds.  Pulmonary:     Effort: Pulmonary effort is normal.     Breath sounds: Normal breath sounds.  Skin:    General: Skin is warm and dry.  Neurological:     Mental Status: She is alert and oriented to person, place, and time.  Psychiatric:        Behavior: Behavior normal.    BP 134/77   Pulse 69   Ht '5\' 2"'$  (1.575 m)   Wt 119 lb (54 kg)   SpO2 100%   BMI 21.77 kg/m  Wt Readings from Last 3 Encounters:  04/03/21 119 lb (54 kg)  09/27/20 123 lb (55.8 kg)  04/27/20 118 lb (53.5 kg)     Health Maintenance Due  Topic Date Due   Zoster Vaccines- Shingrix (1 of 2) Never done   COVID-19 Vaccine (4 - Booster for Pfizer series) 11/08/2020    There are no preventive care reminders to display for this patient.  Lab Results  Component Value Date   TSH 1.33 09/27/2020   Lab Results  Component Value Date   WBC 6.9 02/15/2020   HGB 13.2 02/15/2020   HCT 39.5 02/15/2020   MCV 92.7 02/15/2020   PLT 249 02/15/2020   Lab Results  Component Value Date   NA 140 09/27/2020   K 3.8 09/27/2020   CO2 29 09/27/2020   GLUCOSE 97 09/27/2020   BUN 21 09/27/2020   CREATININE 0.64 09/27/2020   BILITOT 0.6 02/15/2020   ALKPHOS 58 02/23/2017   AST 29 02/15/2020  ALT 21 02/15/2020   PROT 6.3 02/15/2020   ALBUMIN 4.0 02/23/2017   CALCIUM 9.1 09/27/2020   Lab Results  Component Value Date   CHOL 146 02/15/2020   Lab Results  Component Value Date   HDL 53 02/15/2020   Lab Results  Component Value Date   LDLCALC 82 02/15/2020   Lab Results  Component Value Date   TRIG 38 02/15/2020   Lab Results  Component Value Date   CHOLHDL 2.8 02/15/2020   Lab Results  Component Value Date   HGBA1C 5.9 (H) 09/27/2020      Assessment & Plan:   Problem List Items Addressed This Visit       Cardiovascular and Mediastinum   Essential hypertension    Well controlled. Continue current regimen. Follow up in  22mo      Relevant Medications   atorvastatin (LIPITOR) 80 MG tablet   metoprolol succinate (TOPROL-XL) 25 MG 24 hr tablet     Endocrine   IMPAIRED FASTING GLUCOSE - Primary    Due to recheck A1C. She does a great job with diet and staying active.       Relevant Orders   HgB A1c   Lipid panel   COMPLETE METABOLIC PANEL WITH GFR   CBC   Hypothyroidism    Stable. Asymptomatic.  Due to recheck A1C       Relevant Medications   metoprolol succinate (TOPROL-XL) 25 MG 24 hr tablet     Other   S/P stroke due to cerebrovascular disease    Continue daily ASA and statin.        Relevant Medications   atorvastatin (LIPITOR) 80 MG tablet   Other Relevant Orders   HgB A1c   Lipid panel   COMPLETE METABOLIC PANEL WITH GFR   CBC   Other Visit Diagnoses     Hypertension, unspecified type       Relevant Medications   atorvastatin (LIPITOR) 80 MG tablet   metoprolol succinate (TOPROL-XL) 25 MG 24 hr tablet   Other Relevant Orders   HgB A1c   Lipid panel   COMPLETE METABOLIC PANEL WITH GFR   CBC   Wheezing       Low iron       Relevant Orders   Fe+TIBC+Fer   Need for immunization against influenza       Relevant Orders   Flu Vaccine QUAD High Dose(Fluad) (Completed)       Wheezing - recommend that she schedule  spirometry for this fall.  No recent cough fever or upper respiratory symptoms so suspect that its not an acute viral illness.  Consider new onset asthma recommend schedule for further evaluation  Meds ordered this encounter  Medications   atorvastatin (LIPITOR) 80 MG tablet    Sig: Take 1 tablet (80 mg total) by mouth daily.    Dispense:  90 tablet    Refill:  3   metoprolol succinate (TOPROL-XL) 25 MG 24 hr tablet    Sig: Take 1 tablet (25 mg total) by mouth at bedtime.    Dispense:  90 tablet    Refill:  1   valACYclovir (VALTREX) 500 MG tablet    Sig: TAKE ONE (1) TABLET BY MOUTH EACH DAY    Dispense:  30 tablet    Refill:  6    Follow-up: Return in about 3 months (around 07/03/2021) for thyroid/bp.    CBeatrice Lecher MD

## 2021-04-03 NOTE — Assessment & Plan Note (Signed)
Continue daily ASA and statin.

## 2021-04-03 NOTE — Assessment & Plan Note (Signed)
Well controlled. Continue current regimen. Follow up in  6 mo  

## 2021-04-03 NOTE — Assessment & Plan Note (Signed)
Stable. Asymptomatic.  Due to recheck A1C

## 2021-04-04 LAB — LIPID PANEL
Cholesterol: 141 mg/dL (ref <200)
HDL: 60 mg/dL (ref >=50)
LDL Cholesterol (Calc): 68 mg/dL (calc)
Non-HDL Cholesterol (Calc): 81 mg/dL (calc) (ref <130)
Total CHOL/HDL Ratio: 2.4 (calc) (ref <5.0)
Triglycerides: 46 mg/dL (ref <150)

## 2021-04-04 LAB — COMPLETE METABOLIC PANEL WITH GFR
AG Ratio: 2 (calc) (ref 1.0–2.5)
ALT: 37 U/L — ABNORMAL HIGH (ref 6–29)
AST: 35 U/L (ref 10–35)
Albumin: 4.5 g/dL (ref 3.6–5.1)
Alkaline phosphatase (APISO): 72 U/L (ref 37–153)
BUN: 22 mg/dL (ref 7–25)
CO2: 30 mmol/L (ref 20–32)
Calcium: 9.6 mg/dL (ref 8.6–10.4)
Chloride: 104 mmol/L (ref 98–110)
Creat: 0.85 mg/dL (ref 0.60–0.95)
Globulin: 2.3 g/dL (calc) (ref 1.9–3.7)
Glucose, Bld: 101 mg/dL — ABNORMAL HIGH (ref 65–99)
Potassium: 4.1 mmol/L (ref 3.5–5.3)
Sodium: 140 mmol/L (ref 135–146)
Total Bilirubin: 0.8 mg/dL (ref 0.2–1.2)
Total Protein: 6.8 g/dL (ref 6.1–8.1)
eGFR: 69 mL/min/{1.73_m2} (ref >=60)

## 2021-04-04 LAB — IRON,TIBC AND FERRITIN PANEL
%SAT: 25 % (calc) (ref 16–45)
Ferritin: 32 ng/mL (ref 16–288)
Iron: 87 ug/dL (ref 45–160)
TIBC: 345 mcg/dL (calc) (ref 250–450)

## 2021-04-04 LAB — CBC
HCT: 41.5 % (ref 35.0–45.0)
Hemoglobin: 13.9 g/dL (ref 11.7–15.5)
MCH: 30.5 pg (ref 27.0–33.0)
MCHC: 33.5 g/dL (ref 32.0–36.0)
MCV: 91.2 fL (ref 80.0–100.0)
MPV: 11.8 fL (ref 7.5–12.5)
Platelets: 225 10*3/uL (ref 140–400)
RBC: 4.55 10*6/uL (ref 3.80–5.10)
RDW: 12.3 % (ref 11.0–15.0)
WBC: 7.5 10*3/uL (ref 3.8–10.8)

## 2021-04-04 LAB — HEMOGLOBIN A1C
Hgb A1c MFr Bld: 6 % of total Hgb — ABNORMAL HIGH (ref <5.7)
Mean Plasma Glucose: 126 mg/dL
eAG (mmol/L): 7 mmol/L

## 2021-04-04 NOTE — Progress Notes (Signed)
Call patient: A1c looks good at 6.0.  It is stable.  Cholesterol levels look great.  On her metabolic panel one of her limbs liver enzymes is just slightly elevated.  I want to keep an eye on that and plan to recheck it in about a month.  Its not in a worrisome range but I do want a make sure its not trending upward over time.  Blood count is normal.

## 2021-04-08 ENCOUNTER — Other Ambulatory Visit: Payer: Self-pay | Admitting: *Deleted

## 2021-04-08 DIAGNOSIS — R748 Abnormal levels of other serum enzymes: Secondary | ICD-10-CM

## 2021-04-12 ENCOUNTER — Ambulatory Visit (INDEPENDENT_AMBULATORY_CARE_PROVIDER_SITE_OTHER): Payer: Medicare HMO | Admitting: Family Medicine

## 2021-04-12 DIAGNOSIS — Z780 Asymptomatic menopausal state: Secondary | ICD-10-CM

## 2021-04-12 DIAGNOSIS — Z0000 Encounter for general adult medical examination without abnormal findings: Secondary | ICD-10-CM | POA: Diagnosis not present

## 2021-04-12 NOTE — Patient Instructions (Addendum)
[place] Maintenance Summary and Written Plan of Care  Ms. Kathleen [name] ,  Thank you for allowing me to perform your Medicare Annual Wellness Visit and for your ongoing commitment to your health.   Health Maintenance & Immunization History Health Maintenance  Topic Date Due   COVID-19 Vaccine (4 - Booster for Pfizer series) 04/28/2021 (Originally 11/08/2020)   Zoster Vaccines- Shingrix (1 of 2) 07/12/2021 (Originally 01/06/1990)   MAMMOGRAM  08/23/2021   TETANUS/TDAP  07/13/2027   INFLUENZA VACCINE  Completed   DEXA SCAN  Completed   HPV VACCINES  Aged Out   Immunization History  Administered Date(s) Administered   Fluad Quad(high Dose 65+) 03/08/2019, 05/01/2020, 04/03/2021   Influenza Split 05/28/2012   Influenza Whole 05/21/2005, 05/19/2007, 04/27/2008, 03/29/2010, 03/14/2011   Influenza, High Dose Seasonal PF 04/22/2017, 05/07/2018   Influenza,inj,Quad PF,6+ Mos 04/15/2013, 04/25/2014, 04/17/2015, 04/16/2016   PFIZER(Purple Top)SARS-COV-2 Vaccination 09/23/2019, 10/14/2019, 07/10/2020   Pneumococcal Conjugate-13 04/25/2014   Pneumococcal Polysaccharide-23 07/22/2007, 06/12/2009   Td 08/15/2010   Tdap 07/12/2017   Tetanus 07/21/2005   Zoster, Live 03/29/2010    These are the patient goals that we discussed:  Goals Addressed               This Visit's Progress     Patient Stated (pt-stated)        Her goal is to continue to do her daily activities without needing help and continue to work full-time.         This is a list of Health Maintenance Items that are overdue or due now: Bone densitometry screening Shingrix Eye exam  Orders/Referrals Placed Today: Orders Placed This Encounter  Procedures   DEXAScan    Standing Status:   Future    Standing Expiration Date:   04/12/2022    Scheduling Instructions:     Please call patient to schedule. Patient prefers Wednesday morning.    Order Specific Question:   Reason for exam:    Answer:    Post Menopausal    Order Specific Question:   Preferred imaging location?    Answer:   [facility]   (Contact our referral department at [phone] if you have not spoken with someone about your referral appointment within the next 5 days)    Follow-up Plan Follow-up with Hali Marry, MD as planned Schedule your shingles vaccine at the pharmacy.  Bone density referral has been sent.  Medicare wellness visit in one year.  AVS printed and mailed.     Health Maintenance, Female Adopting a healthy lifestyle and getting preventive care are important in promoting health and wellness. Ask your health care provider about: The right schedule for you to have regular tests and exams. Things you can do on your own to prevent diseases and keep yourself healthy. What should I know about diet, weight, and exercise? Eat a healthy diet  Eat a diet that includes plenty of vegetables, fruits, low-fat dairy products, and lean protein. Do not eat a lot of foods that are high in solid fats, added sugars, or sodium. Maintain a healthy weight Body mass index (BMI) is used to identify weight problems. It estimates body fat based on height and weight. Your health care provider can help determine your BMI and help you achieve or maintain a healthy weight. Get regular exercise Get regular exercise. This is one of the most important things you can do for your health. Most adults should: Exercise for at least 150 minutes each week.  The exercise should increase your heart rate and make you sweat (moderate-intensity exercise). Do strengthening exercises at least twice a week. This is in addition to the moderate-intensity exercise. Spend less time sitting. Even light physical activity can be beneficial. Watch cholesterol and blood lipids Have your blood tested for lipids and cholesterol at 81 years of age, then have this test every 5 years. Have your cholesterol levels checked more often  if: Your lipid or cholesterol levels are high. You are older than 80 years of age. You are at high risk for heart disease. What should I know about cancer screening? Depending on your health history and family history, you may need to have cancer screening at various ages. This may include screening for: Breast cancer. Cervical cancer. Colorectal cancer. Skin cancer. Lung cancer. What should I know about heart disease, diabetes, and high blood pressure? Blood pressure and heart disease High blood pressure causes heart disease and increases the risk of stroke. This is more likely to develop in people who have high blood pressure readings, are of African descent, or are overweight. Have your blood pressure checked: Every 3-5 years if you are 62-53 years of age. Every year if you are 73 years old or older. Diabetes Have regular diabetes screenings. This checks your fasting blood sugar level. Have the screening done: Once every three years after age 72 if you are at a normal weight and have a low risk for diabetes. More often and at a younger age if you are overweight or have a high risk for diabetes. What should I know about preventing infection? Hepatitis B If you have a higher risk for hepatitis B, you should be screened for this virus. Talk with your health care provider to find out if you are at risk for hepatitis B infection. Hepatitis C Testing is recommended for: Everyone born from [birth year] through 1966. Anyone with known risk factors for hepatitis C. Sexually transmitted infections (STIs) Get screened for STIs, including gonorrhea and chlamydia, if: You are sexually active and are younger than 81 years of age. You are older than 81 years of age and your health care provider tells you that you are at risk for this type of infection. Your sexual activity has changed since you were last screened, and you are at increased risk for chlamydia or gonorrhea. Ask your health care provider if  you are at risk. Ask your health care provider about whether you are at high risk for HIV. Your health care provider may recommend a prescription medicine to help prevent HIV infection. If you choose to take medicine to prevent HIV, you should first get tested for HIV. You should then be tested every 3 months for as long as you are taking the medicine. Pregnancy If you are about to stop having your period (premenopausal) and you may become pregnant, seek counseling before you get pregnant. Take 400 to 800 micrograms (mcg) of folic acid every day if you become pregnant. Ask for birth control (contraception) if you want to prevent pregnancy. Osteoporosis and menopause Osteoporosis is a disease in which the bones lose minerals and strength with aging. This can result in bone fractures. If you are 65 years old or older, or if you are at risk for osteoporosis and fractures, ask your health care provider if you should: Be screened for bone loss. Take a calcium or vitamin D supplement to lower your risk of fractures. Be given hormone replacement therapy (HRT) to treat symptoms of menopause. Follow these  instructions at home: Lifestyle Do not use any products that contain nicotine or tobacco, such as cigarettes, e-cigarettes, and chewing tobacco. If you need help quitting, ask your health care provider. Do not use street drugs. Do not share needles. Ask your health care provider for help if you need support or information about quitting drugs. Alcohol use Do not drink alcohol if: Your health care provider tells you not to drink. You are pregnant, may be pregnant, or are planning to become pregnant. If you drink alcohol: Limit how much you use to 0-1 drink a day. Limit intake if you are breastfeeding. Be aware of how much alcohol is in your drink. In the [place] one drink equals one 12 oz bottle of beer (355 mL), one 5 oz glass of wine (148 mL), or one 1 oz glass of hard liquor (44 mL). General  instructions Schedule regular health, dental, and eye exams. Stay current with your vaccines. Tell your health care provider if: You often feel depressed. You have ever been abused or do not feel safe at home. Summary Adopting a healthy lifestyle and getting preventive care are important in promoting health and wellness. Follow your health care provider's instructions about healthy diet, exercising, and getting tested or screened for diseases. Follow your health care provider's instructions on monitoring your cholesterol and blood pressure. This information is not intended to replace advice given to you by your health care provider. Make sure you discuss any questions you have with your health care provider. Document Revised: 09/14/2020 Document Reviewed: 06/30/2018 Elsevier Patient Education  2022 [employer].

## 2021-04-12 NOTE — Progress Notes (Signed)
MEDICARE ANNUAL WELLNESS VISIT  04/12/2021  Telephone Visit Disclaimer This Medicare AWV was conducted by telephone due to national recommendations for restrictions regarding the COVID-19 Pandemic (e.g. social distancing).  I verified, using two identifiers, that I am speaking with Kathleen [name] or their authorized healthcare agent. I discussed the limitations, risks, security, and privacy concerns of performing an evaluation and management service by telephone and the potential availability of an in-person appointment in the future. The patient expressed understanding and agreed to proceed.  Location of Patient: Home Location of Provider (nurse):  In the office.  Subjective:    Kathleen [name] is a 80 y.o. female patient of Metheney, Rene Kocher, MD who had a Medicare Annual Wellness Visit today via telephone. Kathleen [name] is Working full time and lives with their spouse. she has 1 child. she reports that she is socially active and does interact with friends/family regularly. she is minimally physically active and enjoys working and IT consultant.  Patient Care Team: Hali Marry, MD as PCP - General  Advanced Directives 04/12/2021 08/18/2018 07/01/2018 12/10/2016 04/25/2014  Does Patient Have a Medical Advance Directive? Yes Yes Yes Yes Yes  Type of Advance Directive Living will [place];Living will [place];Living will Living will Living will  Does patient want to make changes to medical advance directive? No - Patient declined - - - No - Patient declined  Copy of [place] in Chart? - Yes - validated most recent copy scanned in chart (See row information) - - No - copy requested    Hospital Utilization Over the Past 12 Months: # of hospitalizations or ER visits: 0 # of surgeries: 1  Review of Systems    Patient reports that her overall health is unchanged compared to last year.  History obtained from chart review and  the patient  Patient Reported Readings (BP, Pulse, CBG, Weight, etc) none  Pain Assessment Pain : No/denies pain     Current Medications & Allergies (verified) Allergies as of 04/12/2021       Reactions   Streptomycin Other (See Comments)   Passed out   Amlodipine Other (See Comments)   Dizziness on 5mg    Codeine    Other reaction(s): Other (See Comments)   Elemental Sulfur    Other reaction(s): Other (See Comments) Pt not sure   Penicillins    Sulfa Antibiotics    Sulfonamide Derivatives    Benicar [olmesartan] Other (See Comments)   Diizzy   Hctz [hydrochlorothiazide] Other (See Comments)   Vertigo, dizziness.    Lisinopril Other (See Comments)   lightheaded        Medication List        Accurate as of April 12, 2021 10:25 AM. If you have any questions, ask your nurse or doctor.          aspirin EC 81 MG tablet Take 81 mg by mouth daily.   atorvastatin 80 MG tablet Commonly known as: LIPITOR Take 1 tablet (80 mg total) by mouth daily.   CARDIOVID PLUS PO Take by mouth.   cholecalciferol 25 MCG (1000 UNIT) tablet Commonly known as: VITAMIN D3 Take 4,000 Units by mouth daily.   COQ10 PO Take by mouth.   ibuprofen 800 MG tablet Commonly known as: ADVIL TAKE ONE TABLET EVERY 8 HOURS AS NEEDED   IRON PO Take by mouth.   levothyroxine 75 MCG tablet Commonly known as: SYNTHROID TAKE ONE (1) TABLET BY MOUTH EACH DAY  metoprolol succinate 25 MG 24 hr tablet Commonly known as: TOPROL-XL Take 1 tablet (25 mg total) by mouth at bedtime.   OMEGA 3 500 PO Take by mouth.   TURMERIC PO Take by mouth.   valACYclovir 500 MG tablet Commonly known as: VALTREX TAKE ONE (1) TABLET BY MOUTH EACH DAY   vitamin C 100 MG tablet Take 100 mg by mouth daily.   ZINC 15 PO Take by mouth.        History (reviewed): Past Medical History:  Diagnosis Date   Cancer ([place])    Hx RT breast infiltrated ductal CA previously on tamoxifen and armidex     Herpes simplex    lt eye   Hypertension    Kidney stones    history   MVP (mitral valve prolapse)    asymptomatic   Personal history of chemotherapy    Personal history of radiation therapy    Past Surgical History:  Procedure Laterality Date   ABDOMINAL HYSTERECTOMY     partial   BREAST BIOPSY     BREAST LUMPECTOMY Right 2000   RT   CERVICAL LAMINECTOMY  1986   SHOULDER SURGERY     rotator cuff repair   throidectomy  1988   For cancer    Family History  Problem Relation Age of Onset   Breast cancer Mother    Social History   Socioeconomic History   Marital status: Married    Spouse name: Patrick Jupiter   Number of children: 1   Years of education: 12   Highest education level: 12th grade  Occupational History   Occupation: Working full-time at [employer].  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  Other Topics Concern   Not on file  Social History Narrative   Lives with her husband. She has one adopted daughter and two grandchildren. She has been working a lot but she enjoys Psychiatric nurse.    Social Determinants of Health   Financial Resource Strain: Low Risk    Difficulty of Paying Living Expenses: Not hard at all  Food Insecurity: No Food Insecurity   Worried About Charity fundraiser in the Last Year: Never true   [place] in the Last Year: Never true  Transportation Needs: No Transportation Needs   Lack of Transportation (Medical): No   Lack of Transportation (Non-Medical): No  Physical Activity: Inactive   Days of Exercise per Week: 0 days   Minutes of Exercise per Session: 0 min  Stress: No Stress Concern Present   Feeling of Stress : Only a little  Social Connections:  Moderately Isolated   Frequency of Communication with Friends and Family: More than three times a week   Frequency of Social Gatherings with Friends and Family: Three times a week   Attends Religious Services: Never   Active Member of Clubs or Organizations: No   Attends Archivist Meetings: Never   Marital Status: Married    Activities of Daily Living In your present state of health, do you have any difficulty performing the following activities: 04/12/2021  Hearing? N  Vision? N  Difficulty concentrating or making decisions? Y  Comment has been experiencing  some short-term memory loss.  Walking or climbing stairs? N  Dressing or bathing? N  Doing errands, shopping? N  Preparing Food and eating ? N  Using the Toilet? N  In the past six months, have you accidently leaked urine? N  Do you have problems with loss of bowel control? N  Managing your Medications? N  Managing your Finances? N  Housekeeping or managing your Housekeeping? N  Some recent data might be hidden    Patient Education/ Literacy How often do you need to have someone help you when you read instructions, pamphlets, or other written materials from your doctor or pharmacy?: 1 - Never What is the last grade level you completed in school?: 12th grade.  Exercise Current Exercise Habits: The patient has a physically strenuous job, but has no regular exercise apart from work., Exercise limited by: None identified  Diet Patient reports consuming  2-3  meals a day and 2-3 snack(s) a day Patient reports that her primary diet is: Regular Patient reports that she does have regular access to food.   Depression Screen PHQ 2/9 Scores 04/12/2021 04/03/2021 02/15/2020 11/12/2018 02/05/2018 04/22/2017 11/26/2016  PHQ - 2 Score 0 0 0 0 0 0 0  PHQ- 9 Score - - 1 - - - -     Fall Risk Fall Risk  04/12/2021 04/03/2021 02/15/2020 02/05/2018 04/22/2017  Falls in the past year? 0 0 0 No No  Number falls in past yr: 0 0 0 - -   Injury with Fall? 0 0 0 - -  Risk for fall due to : No Fall Risks No Fall Risks - - -  Risk for fall due to: Comment - - - - -  Follow up Falls evaluation completed Falls prevention discussed;Falls evaluation completed Falls evaluation completed - -     Objective:  Chapel Silverthorn seemed alert and oriented and she participated appropriately during our telephone visit.  Blood Pressure Weight BMI  BP Readings from Last 3 Encounters:  04/03/21 134/77  09/27/20 (!) 142/84  04/27/20 (!) 191/86   Wt Readings from Last 3 Encounters:  04/03/21 119 lb (54 kg)  09/27/20 123 lb (55.8 kg)  04/27/20 118 lb (53.5 kg)   BMI Readings from Last 1 Encounters:  04/03/21 21.77 kg/m    *Unable to obtain current vital signs, weight, and BMI due to telephone visit type  Hearing/Vision  Reylene did not seem to have difficulty with hearing/understanding during the telephone conversation Reports that she has not had a formal eye exam by an eye care professional within the past year Reports that she has not had a formal hearing evaluation within the past year *Unable to fully assess hearing and vision during telephone visit type  Cognitive Function: 6CIT Screen 04/12/2021 04/22/2017 04/16/2016  What Year? 0 points 0 points 0 points  What month? 0 points 0 points 0 points  What time? 0 points 0 points 0 points  Count back from 20 0 points 0 points 0 points  Months in reverse 2 points 0 points 0 points  Repeat phrase 0 points 2 points 0 points  Total Score 2 2 0   (Normal:0-7, Significant for Dysfunction: >8)  Normal Cognitive Function Screening: Yes   Immunization & Health Maintenance Record Immunization History  Administered Date(s) Administered   Fluad Quad(high Dose 65+) 03/08/2019, 05/01/2020, 04/03/2021   Influenza Split 05/28/2012   Influenza Whole 05/21/2005, 05/19/2007, 04/27/2008, 03/29/2010, 03/14/2011   Influenza, High Dose Seasonal PF 04/22/2017, 05/07/2018   Influenza,inj,Quad  PF,6+  Mos 04/15/2013, 04/25/2014, 04/17/2015, 04/16/2016   PFIZER(Purple Top)SARS-COV-2 Vaccination 09/23/2019, 10/14/2019, 07/10/2020   Pneumococcal Conjugate-13 04/25/2014   Pneumococcal Polysaccharide-23 07/22/2007, 06/12/2009   Td 08/15/2010   Tdap 07/12/2017   Tetanus 07/21/2005   Zoster, Live 03/29/2010    Health Maintenance  Topic Date Due   COVID-19 Vaccine (4 - Booster for [place] series) 04/28/2021 (Originally 11/08/2020)   Zoster Vaccines- Shingrix (1 of 2) 07/12/2021 (Originally 01/06/1990)   MAMMOGRAM  08/23/2021   TETANUS/TDAP  07/13/2027   INFLUENZA VACCINE  Completed   DEXA SCAN  Completed   HPV VACCINES  Aged Out       Assessment  This is a routine wellness examination for [employer].  Health Maintenance: Due or Overdue There are no preventive care reminders to display for this patient.   Kathleen [name] does not need a referral for Community Assistance: Care Management:   no Social Work:    no Prescription Assistance:  no Nutrition/Diabetes Education:  no   Plan:  Personalized Goals  Goals Addressed               This Visit's Progress     Patient Stated (pt-stated)        Her goal is to continue to do her daily activities without needing help and continue to work full-time.       Personalized Health Maintenance & Screening Recommendations  Bone densitometry screening Shingrix Eye exam  Lung Cancer Screening Recommended: no (Low Dose CT Chest recommended if Age 19-80 years, 30 pack-year currently smoking OR have quit w/in past 15 years) Hepatitis C Screening recommended: no HIV Screening recommended: no  Advanced Directives: Written information was not prepared per patient's request.  Referrals & Orders Orders Placed This Encounter  Procedures   [place]     Follow-up Plan Follow-up with Hali Marry, MD as planned Schedule your shingles vaccine at the pharmacy.  Bone density referral has been sent.  Medicare wellness visit in  one year.  AVS printed and mailed.   I have personally reviewed and noted the following in the patient's chart:   Medical and social history Use of alcohol, tobacco or illicit drugs  Current medications and supplements Functional ability and status Nutritional status Physical activity Advanced directives List of other physicians Hospitalizations, surgeries, and ER visits in previous 12 months Vitals Screenings to include cognitive, depression, and falls Referrals and appointments  In addition, I have reviewed and discussed with Kathleen [name] certain preventive protocols, quality metrics, and best practice recommendations. A written personalized care plan for preventive services as well as general preventive health recommendations is available and can be mailed to the patient at her request.      Tinnie Gens, RN  04/12/2021

## 2021-05-15 ENCOUNTER — Other Ambulatory Visit: Payer: Medicare HMO

## 2021-05-15 ENCOUNTER — Ambulatory Visit (INDEPENDENT_AMBULATORY_CARE_PROVIDER_SITE_OTHER): Payer: Medicare HMO | Admitting: Family Medicine

## 2021-05-15 VITALS — BP 145/69 | HR 71 | Ht 62.0 in | Wt 117.0 lb

## 2021-05-15 DIAGNOSIS — J984 Other disorders of lung: Secondary | ICD-10-CM

## 2021-05-15 DIAGNOSIS — R062 Wheezing: Secondary | ICD-10-CM

## 2021-05-15 MED ORDER — ALBUTEROL SULFATE HFA 108 (90 BASE) MCG/ACT IN AERS
4.0000 | INHALATION_SPRAY | Freq: Once | RESPIRATORY_TRACT | Status: AC
  Administered 2021-05-15: 4 via RESPIRATORY_TRACT

## 2021-05-15 NOTE — Assessment & Plan Note (Signed)
Reviewed spirometry results today.  Showed FVC of 72%, FEV1 of 78% and ratio of 79.  No significant improvement after albuterol.  Though she did have a 28% improvement in FEF 25-75 after albuterol.  We reviewed the results together which look more consistent with some slight restriction.  We discussed that this can come from different things including scar tissue in the lungs, poorly functioning diaphragm etc.  I would like to also get a chest x-ray for further work-up.  She notices the wheezing mostly when she is very active or when she feels stressed.  At this point I do not think she would benefit from inhalers.

## 2021-05-15 NOTE — Progress Notes (Signed)
Established Patient Office Visit  Subjective:  Patient ID: Kathleen [name], female    DOB: 1940-08-14  Age: 80 y.o. MRN: [id number]  CC: No chief complaint on file.   HPI [name] presents for wheezing.  When I last saw her in September we discussed following up for spirometry this fall.  She notices the wheezing mostly if she feels stressed, or if she gets very active.  She can experience some shortness of breath with that as well.  Past Medical History:  Diagnosis Date   Cancer ([facility])    Hx RT breast infiltrated ductal CA previously on tamoxifen and armidex    Herpes simplex    lt eye   Hypertension    Kidney stones    history   MVP (mitral valve prolapse)    asymptomatic   Personal history of chemotherapy    Personal history of radiation therapy     Past Surgical History:  Procedure Laterality Date   ABDOMINAL HYSTERECTOMY     partial   BREAST BIOPSY     BREAST LUMPECTOMY Right 2000   RT   CERVICAL LAMINECTOMY  1986   SHOULDER SURGERY     rotator cuff repair   throidectomy  1988   For cancer     Family History  Problem Relation Age of Onset   Breast cancer Mother     Social History   Socioeconomic History   Marital status: Married    Spouse name: Deniece Portela   Number of children: 1   Years of education: 12   Highest education level: 12th grade  Occupational History   Occupation: Working full-time at [employer].  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  Other Topics Concern   Not on file  Social History Narrative   Lives with her husband. She has one adopted daughter and two grandchildren. She has been working a lot but she enjoys Copywriter, advertising.    Social Determinants of Health    Financial Resource Strain: Low Risk    Difficulty of Paying Living Expenses: Not hard at all  Food Insecurity: No Food Insecurity   Worried About Programme researcher, broadcasting/film/video in the Last Year: Never true   Ran Out of Food in the Last Year: Never true  Transportation Needs: No Transportation Needs   Lack of Transportation (Medical): No   Lack of Transportation (Non-Medical): No  Physical Activity: Inactive   Days of Exercise per Week: 0 days   Minutes of Exercise per Session: 0 min  Stress: No Stress Concern Present   Feeling of Stress : Only a little  Social Connections: Moderately Isolated   Frequency of Communication with Friends and Family: More than three times a week   Frequency of Social Gatherings with Friends and Family: Three times a week   Attends Religious Services: Never   Active Member of Clubs or Organizations: No   Attends Banker Meetings: Never   Marital Status: Married  Catering manager Violence: Not At Risk   Fear of Current or Ex-Partner: No   Emotionally Abused: No   Physically Abused: No   Sexually Abused: No    Outpatient Medications Prior to Visit  Medication  Sig Dispense Refill   Ascorbic Acid (VITAMIN C) 100 MG tablet Take 100 mg by mouth daily.     aspirin EC 81 MG tablet Take 81 mg by mouth daily.     atorvastatin (LIPITOR) 80 MG tablet Take 1 tablet (80 mg total) by mouth daily. 90 tablet 3   cholecalciferol (VITAMIN D3) 25 MCG (1000 UNIT) tablet Take 4,000 Units by mouth daily.     Coenzyme Q10 (COQ10 PO) Take by mouth.     DHA-EPA-Vit B6-B12-Folic Acid (CARDIOVID PLUS PO) Take by mouth.     Ferrous Sulfate (IRON PO) Take by mouth.     ibuprofen (ADVIL) 800 MG tablet TAKE ONE TABLET EVERY 8 HOURS AS NEEDED 60 tablet 1   levothyroxine (SYNTHROID) 75 MCG tablet TAKE ONE (1) TABLET BY MOUTH EACH DAY 90 tablet 2   metoprolol succinate (TOPROL-XL) 25 MG 24 hr tablet Take 1 tablet (25 mg total) by mouth at bedtime. 90 tablet 1   Omega-3 Fatty  Acids (OMEGA 3 500 PO) Take by mouth.     TURMERIC PO Take by mouth.     valACYclovir (VALTREX) 500 MG tablet TAKE ONE (1) TABLET BY MOUTH EACH DAY 30 tablet 6   Zinc Sulfate (ZINC 15 PO) Take by mouth.     No facility-administered medications prior to visit.    Allergies  Allergen Reactions   Streptomycin Other (See Comments)    Passed out   Amlodipine Other (See Comments)    Dizziness on $RemoveBefo'5mg'PMShzxGPawz$     Codeine     Other reaction(s): Other (See Comments)   Elemental Sulfur     Other reaction(s): Other (See Comments) Pt not sure   Penicillins    Sulfa Antibiotics    Sulfonamide Derivatives    Benicar [Olmesartan] Other (See Comments)    Diizzy   Hctz [Hydrochlorothiazide] Other (See Comments)    Vertigo, dizziness.    Lisinopril Other (See Comments)    lightheaded    ROS Review of Systems    Objective:    Physical Exam  BP (!) 145/69   Pulse 71   Ht $R'5\' 2"'qA$  (1.575 m)   Wt 117 lb (53.1 kg)   SpO2 100% Comment: on RA  BMI 21.40 kg/m  Wt Readings from Last 3 Encounters:  05/15/21 117 lb (53.1 kg)  04/03/21 119 lb (54 kg)  09/27/20 123 lb (55.8 kg)     Health Maintenance Due  Topic Date Due   COVID-19 Vaccine (4 - Booster for Pfizer series) 09/04/2020    There are no preventive care reminders to display for this patient.  Lab Results  Component Value Date   TSH 1.33 09/27/2020   Lab Results  Component Value Date   WBC 7.5 04/03/2021   HGB 13.9 04/03/2021   HCT 41.5 04/03/2021   MCV 91.2 04/03/2021   PLT 225 04/03/2021   Lab Results  Component Value Date   NA 140 04/03/2021   K 4.1 04/03/2021   CO2 30 04/03/2021   GLUCOSE 101 (H) 04/03/2021   BUN 22 04/03/2021   CREATININE 0.85 04/03/2021   BILITOT 0.8 04/03/2021   ALKPHOS 58 02/23/2017   AST 35 04/03/2021   ALT 37 (H) 04/03/2021   PROT 6.8 04/03/2021   ALBUMIN 4.0 02/23/2017   CALCIUM 9.6 04/03/2021   EGFR 69 04/03/2021   Lab Results  Component Value Date   CHOL 141 04/03/2021   Lab  Results  Component Value Date   HDL 60 04/03/2021   Lab  Results  Component Value Date   LDLCALC 68 04/03/2021   Lab Results  Component Value Date   TRIG 46 04/03/2021   Lab Results  Component Value Date   CHOLHDL 2.4 04/03/2021   Lab Results  Component Value Date   HGBA1C 6.0 (H) 04/03/2021      Assessment & Plan:   Problem List Items Addressed This Visit       Respiratory   Restrictive lung disease    Reviewed spirometry results today.  Showed FVC of 72%, FEV1 of 78% and ratio of 79.  No significant improvement after albuterol.  Though she did have a 28% improvement in FEF 25-75 after albuterol.  We reviewed the results together which look more consistent with some slight restriction.  We discussed that this can come from different things including scar tissue in the lungs, poorly functioning diaphragm etc.  I would like to also get a chest x-ray for further work-up.  She notices the wheezing mostly when she is very active or when she feels stressed.  At this point I do not think she would benefit from inhalers.      Other Visit Diagnoses     Wheezing    -  Primary   Relevant Medications   albuterol (VENTOLIN HFA) 108 (90 Base) MCG/ACT inhaler 4 puff (Completed)   Other Relevant Orders   PR EVAL OF BRONCHOSPASM   DG Chest 2 View       Meds ordered this encounter  Medications   albuterol (VENTOLIN HFA) 108 (90 Base) MCG/ACT inhaler 4 puff    Follow-up: No follow-ups on file.    Beatrice Lecher, MD

## 2021-05-17 ENCOUNTER — Other Ambulatory Visit: Payer: Self-pay

## 2021-05-17 ENCOUNTER — Ambulatory Visit (INDEPENDENT_AMBULATORY_CARE_PROVIDER_SITE_OTHER): Payer: Medicare HMO

## 2021-05-17 DIAGNOSIS — R062 Wheezing: Secondary | ICD-10-CM | POA: Diagnosis not present

## 2021-05-17 DIAGNOSIS — I517 Cardiomegaly: Secondary | ICD-10-CM | POA: Diagnosis not present

## 2021-05-21 NOTE — Progress Notes (Signed)
Hi Zyionna, chest x-ray looks normal.  I apologize I was out of town and so did not get a chance to see your result before I left.  But everything looks good.

## 2021-07-03 ENCOUNTER — Ambulatory Visit (INDEPENDENT_AMBULATORY_CARE_PROVIDER_SITE_OTHER): Payer: Medicare HMO

## 2021-07-03 ENCOUNTER — Encounter: Payer: Self-pay | Admitting: Family Medicine

## 2021-07-03 ENCOUNTER — Other Ambulatory Visit: Payer: Self-pay

## 2021-07-03 ENCOUNTER — Ambulatory Visit (INDEPENDENT_AMBULATORY_CARE_PROVIDER_SITE_OTHER): Payer: Medicare HMO | Admitting: Family Medicine

## 2021-07-03 VITALS — BP 164/80 | HR 77 | Temp 98.1°F | Resp 16 | Ht 60.0 in | Wt 114.0 lb

## 2021-07-03 DIAGNOSIS — M79674 Pain in right toe(s): Secondary | ICD-10-CM

## 2021-07-03 DIAGNOSIS — R5383 Other fatigue: Secondary | ICD-10-CM | POA: Diagnosis not present

## 2021-07-03 DIAGNOSIS — M545 Low back pain, unspecified: Secondary | ICD-10-CM

## 2021-07-03 DIAGNOSIS — M19071 Primary osteoarthritis, right ankle and foot: Secondary | ICD-10-CM | POA: Diagnosis not present

## 2021-07-03 DIAGNOSIS — R748 Abnormal levels of other serum enzymes: Secondary | ICD-10-CM | POA: Diagnosis not present

## 2021-07-03 DIAGNOSIS — I10 Essential (primary) hypertension: Secondary | ICD-10-CM | POA: Diagnosis not present

## 2021-07-03 DIAGNOSIS — R945 Abnormal results of liver function studies: Secondary | ICD-10-CM | POA: Diagnosis not present

## 2021-07-03 DIAGNOSIS — M25571 Pain in right ankle and joints of right foot: Secondary | ICD-10-CM | POA: Diagnosis not present

## 2021-07-03 DIAGNOSIS — M7989 Other specified soft tissue disorders: Secondary | ICD-10-CM | POA: Diagnosis not present

## 2021-07-03 DIAGNOSIS — M47816 Spondylosis without myelopathy or radiculopathy, lumbar region: Secondary | ICD-10-CM | POA: Diagnosis not present

## 2021-07-03 DIAGNOSIS — D649 Anemia, unspecified: Secondary | ICD-10-CM | POA: Diagnosis not present

## 2021-07-03 NOTE — Assessment & Plan Note (Signed)
BP is still high today. Plan to recheck at home.

## 2021-07-03 NOTE — Progress Notes (Signed)
Established Patient Office Visit  Subjective:  Patient ID: Kathleen [name], female    DOB: 1940/10/07  Age: 80 y.o. MRN: [id number]  CC:  Chief Complaint  Patient presents with   Hypertension    Follow up    Thyroid    Follow up    Back Pain    Low back pain for 2-3 months. No known injury. Painful when standing for a while or lifting objects. Burning pain    Toe Pain    Right big toe soreness, swelling in right ankle, Discuss referral to Podiatrist     HPI Kathleen [name] presents for   Hypertension- Pt denies chest pain, SOB, dizziness, or heart palpitations.  Taking meds as directed w/o problems.  Denies medication side effects.    Low back pain for 2-3 months. No known injury. Painful when standing for a while or lifting objects. Burning pain. Pain always worse in the evenings after work. She has been wearing a brace tor support and feels it does help.  Feels better when sitting.  Using a heating pad at night.    Hypothyroidism - Taking medication regularly in the AM away from food and vitamins, etc. No recent change to skin, hair, or energy levels.  Also c/o of right ankle pain and swelling for a couple of months. No known injury or trauma.  Pain and tenderness is mostly latera.  Also pain over her right great toe. Feels painful and stiff at work. No trauma or swelling. No hx of gout.  She also reports she occasionally feels some numbness in her foot.   Past Medical History:  Diagnosis Date   Cancer (HCC)    Hx RT breast infiltrated ductal CA previously on tamoxifen and armidex    Herpes simplex    lt eye   Hypertension    Kidney stones    history   MVP (mitral valve prolapse)    asymptomatic   Personal history of chemotherapy    Personal history of radiation therapy     Past Surgical History:  Procedure Laterality Date   ABDOMINAL HYSTERECTOMY     partial   BREAST BIOPSY     BREAST LUMPECTOMY Right 2000   RT   CERVICAL LAMINECTOMY  1986   SHOULDER SURGERY      rotator cuff repair   throidectomy  1988   For cancer     Family History  Problem Relation Age of Onset   Breast cancer Mother     Social History   Socioeconomic History   Marital status: Married    Spouse name: Deniece Portela   Number of children: 1   Years of education: 12   Highest education level: 12th grade  Occupational History   Occupation: Working full-time at [employer].  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  Other Topics Concern   Not on file  Social History Narrative   Lives with her husband. She has one adopted daughter and two grandchildren. She has been working a lot but she enjoys Copywriter, advertising.    Social Determinants of Health   Financial Resource Strain: Low Risk    Difficulty of Paying Living Expenses: Not hard at all  Food Insecurity: No Food Insecurity   Worried About Programme researcher, broadcasting/film/video in the Last Year: Never true   Ran Out of Food in the Last Year: Never true  Transportation Needs: No Transportation Needs   Lack of Transportation (Medical): No   Lack of Transportation (Non-Medical): No  Physical Activity: Inactive   Days of Exercise per Week: 0 days   Minutes of Exercise per Session: 0 min  Stress: No Stress Concern Present   Feeling of Stress : Only a little  Social Connections: Moderately Isolated   Frequency of Communication with Friends and Family: More than three times a week   Frequency of Social Gatherings with Friends and Family: Three times a week   Attends Religious Services: Never   Active Member of Clubs or Organizations: No   Attends Banker Meetings: Never   Marital Status: Married  Catering manager Violence: Not At Risk   Fear of Current or  Ex-Partner: No   Emotionally Abused: No   Physically Abused: No   Sexually Abused: No    Outpatient Medications Prior to Visit  Medication Sig Dispense Refill   Ascorbic Acid (VITAMIN C) 100 MG tablet Take 100 mg by mouth daily.     aspirin EC 81 MG tablet Take 81 mg by mouth daily.     atorvastatin (LIPITOR) 80 MG tablet Take 1 tablet (80 mg total) by mouth daily. 90 tablet 3   cholecalciferol (VITAMIN D3) 25 MCG (1000 UNIT) tablet Take 4,000 Units by mouth daily.     Coenzyme Q10 (COQ10 PO) Take by mouth.     DHA-EPA-Vit B6-B12-Folic Acid (CARDIOVID PLUS PO) Take by mouth.     Ferrous Sulfate (IRON PO) Take by mouth.     ibuprofen (ADVIL) 800 MG tablet TAKE ONE TABLET EVERY 8 HOURS AS NEEDED 60 tablet 1   levothyroxine (SYNTHROID) 75 MCG tablet TAKE ONE (1) TABLET BY MOUTH EACH DAY 90 tablet 2   metoprolol succinate (TOPROL-XL) 25 MG 24 hr tablet Take 1 tablet (25 mg total) by mouth at bedtime. 90 tablet 1   Omega-3 Fatty Acids (OMEGA 3 500 PO) Take by mouth.     TURMERIC PO Take by mouth.     valACYclovir (VALTREX) 500 MG tablet TAKE ONE (1) TABLET BY MOUTH EACH DAY 30 tablet 6   Zinc Sulfate (ZINC 15 PO) Take by mouth.     No facility-administered medications prior to visit.    Allergies  Allergen Reactions   Streptomycin Other (See Comments)    Passed out   Amlodipine Other (See Comments)    Dizziness on 5mg     Codeine     Other reaction(s): Other (See Comments)   Elemental Sulfur     Other reaction(s): Other (See Comments) Pt not sure   Penicillins    Sulfa Antibiotics    Sulfonamide Derivatives    Benicar [Olmesartan] Other (See Comments)    Diizzy   Hctz [Hydrochlorothiazide] Other (See Comments)    Vertigo, dizziness.    Lisinopril Other (See Comments)    lightheaded    ROS Review of Systems    Objective:    Physical Exam Constitutional:      Appearance: Normal appearance. She is well-developed.  HENT:     Head: Normocephalic and atraumatic.   Cardiovascular:     Rate and Rhythm: Normal rate and regular rhythm.     Heart sounds: Normal heart sounds.  Pulmonary:     Effort: Pulmonary effort is normal.  Breath sounds: Normal breath sounds.  Musculoskeletal:     Comments: Tender over right lateral ankle just below the malleolus. Trace swelling.  Right great toe with no erythema or swelling.  NROM.    Skin:    General: Skin is warm and dry.  Neurological:     Mental Status: She is alert and oriented to person, place, and time.  Psychiatric:        Behavior: Behavior normal.    BP (!) 164/80    Pulse 77    Temp 98.1 F (36.7 C)    Resp 16    Ht 5' (1.524 m)    Wt 114 lb (51.7 kg)    HC 16" (40.6 cm)    SpO2 97%    BMI 22.26 kg/m  Wt Readings from Last 3 Encounters:  07/03/21 114 lb (51.7 kg)  05/15/21 117 lb (53.1 kg)  04/03/21 119 lb (54 kg)     There are no preventive care reminders to display for this patient.  There are no preventive care reminders to display for this patient.  Lab Results  Component Value Date   TSH 1.33 09/27/2020   Lab Results  Component Value Date   WBC 7.5 04/03/2021   HGB 13.9 04/03/2021   HCT 41.5 04/03/2021   MCV 91.2 04/03/2021   PLT 225 04/03/2021   Lab Results  Component Value Date   NA 140 04/03/2021   K 4.1 04/03/2021   CO2 30 04/03/2021   GLUCOSE 101 (H) 04/03/2021   BUN 22 04/03/2021   CREATININE 0.85 04/03/2021   BILITOT 0.8 04/03/2021   ALKPHOS 58 02/23/2017   AST 35 04/03/2021   ALT 37 (H) 04/03/2021   PROT 6.8 04/03/2021   ALBUMIN 4.0 02/23/2017   CALCIUM 9.6 04/03/2021   EGFR 69 04/03/2021   Lab Results  Component Value Date   CHOL 141 04/03/2021   Lab Results  Component Value Date   HDL 60 04/03/2021   Lab Results  Component Value Date   LDLCALC 68 04/03/2021   Lab Results  Component Value Date   TRIG 46 04/03/2021   Lab Results  Component Value Date   CHOLHDL 2.4 04/03/2021   Lab Results  Component Value Date   HGBA1C 6.0 (H)  04/03/2021      Assessment & Plan:   Problem List Items Addressed This Visit       Cardiovascular and Mediastinum   Essential hypertension    BP is still high today. Plan to recheck at home.          Musculoskeletal and Integument   Lumbar spondylosis    Will get uptodate xray since pain has started back. Had injections and MRI in 2018. She felt the injections didn't help 4 years ago. Discussed physical therapy as an opiton. She is open to it.       Relevant Orders   Ambulatory referral to Physical Therapy   Other Visit Diagnoses     Hypertension, unspecified type    -  Primary   Acute right ankle pain       Relevant Orders   DG Ankle Complete Right   Great toe pain, right       Relevant Orders   DG Toe Great Right   Fatigue, unspecified type       Relevant Orders   B12   Low back pain, unspecified back pain laterality, unspecified chronicity, unspecified whether sciatica present       Relevant Orders  DG Lumbar Spine Complete       Right lateral ankle pain and swelling. Will get plan film xrays.  She does not do any specific trauma or injury is not consistent with gout etc.  I do think may be some good supportive orthotics could be helpful.  Right great toe pain - will get xrays.  Suspect related to arthritis.  She does normally wear good supportive shoes.  Consider podiatry for all.  No orders of the defined types were placed in this encounter.   Follow-up: No follow-ups on file.    Beatrice Lecher, MD

## 2021-07-03 NOTE — Assessment & Plan Note (Signed)
Will get uptodate xray since pain has started back. Had injections and MRI in 2018. She felt the injections didn't help 4 years ago. Discussed physical therapy as an opiton. She is open to it.

## 2021-07-04 LAB — ALT: ALT: 23 U/L (ref 6–29)

## 2021-07-04 LAB — VITAMIN B12: Vitamin B-12: 815 pg/mL (ref 200–1100)

## 2021-07-04 NOTE — Progress Notes (Signed)
Call patient: B12 looks great.  And the liver enzyme that was slightly elevated 3 months ago is back down to normal.  Please check your blood pressures at home and let us know how they are doing would like to see back in 2 weeks for repeat blood pressure in the office if you are able to do so.

## 2021-07-04 NOTE — Progress Notes (Signed)
Call patient: She does have some arthritis in her great toe.  We could certainly work on getting her in with our podiatrist to see if they could discuss some remedies to help relieve some pain and pressure on her feet.

## 2021-07-05 NOTE — Progress Notes (Signed)
Call patient: She has mild to moderate multilevel degenerative disc disease in her low back.  She also has some arthritis in the hinge portion of her spine.  It is little worse on the left side compared to the right.  And she also has some shifting of the vertebrae on top of the other 1 which we call anterior listhesis, at L4-5 which is in the lower part of the lumbar spine.  I still think formal physical therapy for her back would be helpful so she is okay with that we can place a referral.

## 2021-07-05 NOTE — Progress Notes (Signed)
Call patient: X-ray shows no evidence of significant ankle arthritis which is actually really reassuring.  She does have a very large heel spur but its more on the bottom of the foot and not on the back of the heel.  No sign of fracture or dislocation so it is reassuring.  I do think she would benefit from formal physical therapy for that ankle we can either do a handout and she can try some stretches at home or we can send her to formal PT just let us know which she prefers but I do think we should work on trying to strengthen it.  And I had also mentioned a podiatry referral for her toe.  Admit they may have some suggestions for ankle also sometimes getting some real good support for the foot can make a big difference with ankle and knee pain as well.

## 2021-07-10 ENCOUNTER — Other Ambulatory Visit: Payer: Self-pay

## 2021-07-10 DIAGNOSIS — M25571 Pain in right ankle and joints of right foot: Secondary | ICD-10-CM

## 2021-07-25 ENCOUNTER — Ambulatory Visit: Payer: Medicare HMO | Admitting: Physical Therapy

## 2021-07-26 ENCOUNTER — Ambulatory Visit: Payer: Medicare HMO | Attending: Family Medicine | Admitting: Rehabilitative and Restorative Service Providers"

## 2021-07-26 ENCOUNTER — Telehealth: Payer: Self-pay | Admitting: Family Medicine

## 2021-07-26 ENCOUNTER — Encounter: Payer: Self-pay | Admitting: Rehabilitative and Restorative Service Providers"

## 2021-07-26 ENCOUNTER — Other Ambulatory Visit: Payer: Self-pay

## 2021-07-26 DIAGNOSIS — M5441 Lumbago with sciatica, right side: Secondary | ICD-10-CM | POA: Diagnosis not present

## 2021-07-26 DIAGNOSIS — M6281 Muscle weakness (generalized): Secondary | ICD-10-CM | POA: Diagnosis not present

## 2021-07-26 DIAGNOSIS — R29898 Other symptoms and signs involving the musculoskeletal system: Secondary | ICD-10-CM | POA: Insufficient documentation

## 2021-07-26 DIAGNOSIS — G8929 Other chronic pain: Secondary | ICD-10-CM | POA: Insufficient documentation

## 2021-07-26 DIAGNOSIS — M47816 Spondylosis without myelopathy or radiculopathy, lumbar region: Secondary | ICD-10-CM | POA: Diagnosis not present

## 2021-07-26 DIAGNOSIS — R293 Abnormal posture: Secondary | ICD-10-CM | POA: Diagnosis not present

## 2021-07-26 NOTE — Patient Instructions (Addendum)
Access Code: [email or web address] URL: [email or web address] Date: 07/26/2021 Prepared by: Gillermo Murdoch  Exercises Hooklying Hamstring Stretch with Strap - 2 x daily - 7 x weekly - 1 sets - 3 reps - 30 sec hold Supine Piriformis Stretch with Leg Straight - 2 x daily - 7 x weekly - 1 sets - 3 reps - 30 sec hold Seated Lateral Trunk Stretch on Swiss Ball - 2 x daily - 7 x weekly - 1 sets - 3 reps - 30 sec hold  TENS UNIT: This is helpful for muscle pain and spasm.   Search and Purchase a TENS 7000 2nd edition at www.tenspros.com. It should be less than $30.     TENS unit instructions: Do not shower or bathe with the unit on Turn the unit off before removing electrodes or batteries If the electrodes lose stickiness add a drop of water to the electrodes after they are disconnected from the unit and place on plastic sheet. If you continued to have difficulty, call the TENS unit company to purchase more electrodes. Do not apply lotion on the skin area prior to use. Make sure the skin is clean and dry as this will help prolong the life of the electrodes. After use, always check skin for unusual red areas, rash or other skin difficulties. If there are any skin problems, does not apply electrodes to the same area. Never remove the electrodes from the unit by pulling the wires. Do not use the TENS unit or electrodes other than as directed. Do not change electrode placement without consultating your therapist or physician. Keep 2 fingers with between each electrode.

## 2021-07-26 NOTE — Telephone Encounter (Signed)
Pt is requesting a referral to see a Nutritionist because she feels like she can not stop losing weight.

## 2021-07-26 NOTE — Therapy (Addendum)
[facility] [place], [place], [address] Phone: [phone]   Fax:  [phone]  Physical Therapy Evaluation and Discharge  Patient Details  Name: Kathleen [name] MRN: [id number] Date of Birth: [birth year] Referring Provider (PT): Dr Beatrice Lecher   Encounter Date: 07/26/2021   PT End of Session - 07/26/21 0905     Visit Number 1    Number of Visits 12    Date for PT Re-Evaluation 09/06/21    Authorization - Visit Number 12    Progress Note Due on Visit 10    PT Start Time 0800    PT Stop Time 0904    PT Time Calculation (min) 64 min    Activity Tolerance Patient tolerated treatment well             Past Medical History:  Diagnosis Date   Cancer ([place])    Hx RT breast infiltrated ductal CA previously on tamoxifen and armidex    Herpes simplex    lt eye   Hypertension    Kidney stones    history   MVP (mitral valve prolapse)    asymptomatic   Personal history of chemotherapy    Personal history of radiation therapy     Past Surgical History:  Procedure Laterality Date   ABDOMINAL HYSTERECTOMY     partial   BREAST BIOPSY     BREAST LUMPECTOMY Right 2000   RT   CERVICAL LAMINECTOMY  1986   SHOULDER SURGERY     rotator cuff repair   throidectomy  1988   For cancer     There were no vitals filed for this visit.    Subjective Assessment - 07/26/21 0804     Subjective Patient reports that she has had LBP for some time - several months. She is working full time standing and walking on cement floors [name] day. She uses a microwave hot pack at night which helps her get to sleep. She is sore in the morning and by mid morning she is hurting. She wears a brace every day which helps with less pain. She has swelling and painin the Rt ankle by the end of the day.    Pertinent History stroke 2018; cervical disc sx ~ 30 yrs ago; arthritis; scoliosis; HTN    Diagnostic tests anterolisthesis lumbar  spine; ankle xray (-)    Patient Stated Goals get stronger to be able to continue working and hurt less    Currently in Pain? No/denies    Pain Score 0-No pain    Pain Location Back    Pain Orientation Left;Right;Lower    Pain Descriptors / Indicators Sore;Burning    Pain Type Chronic pain    Pain Radiating Towards sometimes when in her car she will have a catch on the Rt side - resolves with movement    Pain Onset More than a month ago    Pain Frequency Intermittent    Aggravating Factors  standing; walking; worse as the day goes on    Pain Relieving Factors sitting down for a period of time; back brace                [facility] PT Assessment - 07/26/21 0001       Assessment   Medical Diagnosis Low back pain; Rt ankle pain    Referring Provider (PT) Dr Beatrice Lecher    Onset Date/Surgical Date 12/19/20    Hand Dominance Right    Next MD Visit 07/30/21  Prior Therapy here for LBP      Precautions   Precautions None      Balance Screen   Has the patient fallen in the past 6 months No    Has the patient had a decrease in activity level because of a fear of falling?  No    Is the patient reluctant to leave their home because of a fear of falling?  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    Leisure household chores; cooking; cleaning      Observation/Other Assessments   Focus on Therapeutic Outcomes (FOTO)  40      Posture/Postural Control   Posture Comments head forward; shoulders rounded; upper body shifted and tipped to Rt; wear pattern on shoe more on the Rt foot      AROM   Lumbar Flexion 60% pulling discomfort Rt LB    Lumbar Extension 20% pulling discomfort Rt LB    Lumbar - Right Side Bend 80% some discomfort    Lumbar - Left Side Bend 60% discomfort Rt LB     Lumbar - Right Rotation 60%    Lumbar - Left Rotation 65%      Strength   Overall Strength Comments weak core    Right Hip Flexion 4+/5    Right Hip Extension 4/5    Right Hip ABduction 4/5    Left Hip Flexion 4/5    Left Hip Extension 4-/5    Left Hip ABduction 4-/5      Flexibility   Hamstrings tight lt > Rt    Piriformis tight Lt > Rt      Palpation   Palpation comment muscular tightness Lt QL/lats/lumbar paraspinals; Rt posterior hip gluts/piriformis; Lt > Rt hip flexors                        Objective measurements completed on examination: See above findings.       [facility] Adult PT Treatment/Exercise - 07/26/21 0001       Lumbar Exercises: Stretches   Passive Hamstring Stretch Right;Left;2 reps;30 seconds    Passive Hamstring Stretch Limitations supine with strap    Piriformis Stretch Right;Left;2 reps;30 seconds    Piriformis Stretch Limitations supine travell    Other Lumbar Stretch Exercise sitting lateral trunk flexion to Lt 30 sec x 3 reps      Moist Heat Therapy   Number Minutes Moist Heat 10 Minutes    Moist Heat Location Lumbar Spine      Electrical Stimulation   Electrical Stimulation Location Lt lumbar spine; Rt posterior hip    Electrical Stimulation Action TENS    Electrical Stimulation Parameters to tolerance    Electrical Stimulation Goals Pain;Tone      Manual Therapy   Soft tissue mobilization deep tissue Lt lumbar spine and Rt posterior hip                     PT Education - 07/26/21 0841     Education Details POC HEP TENS    Person(s) Educated Patient    Methods Explanation;Demonstration;Tactile cues;Verbal cues;Handout    Comprehension Verbalized understanding;Returned demonstration;Verbal cues required;Tactile cues required  PT Long Term Goals - 07/26/21 0911       PT LONG TERM GOAL #1   Title Improve posture and alignment with patient to demonstrate more upright posture with less  lateral shift to Rt in sitting and standing    Time 6    Period Weeks    Status New    Target Date 09/06/21      PT LONG TERM GOAL #2   Title Improve gore strength and stability allowing patient to perform functional activities required for work and ADL's with greater ease    Time 6    Period Weeks    Status New    Target Date 09/06/21      PT LONG TERM GOAL #3   Title Increase strength bilat LE's to 4+/5 to 5/5    Time 6    Period Weeks    Status New    Target Date 09/06/21      PT LONG TERM GOAL #4   Title Independent in HEP    Time 6    Period Weeks    Status New    Target Date 09/06/21      PT LONG TERM GOAL #5   Title Improve functional limitation score to 52    Time 6    Period Weeks    Status New    Target Date 09/06/21                    Plan - 07/26/21 0906     Clinical Impression Statement Kathleen [name] presents with flare up of chronic LBP. She has Rt  > Lt LE weakness; limited trunk and LE mobility/ROM; muscular tightness through the Lt lumbar and Rt posterior hip as well as Lt > Rt hip flexors. Patient has abnormal posture and movement patterns; core and LE weakness; limited tolerance for functional activities; pain on a daily basis limiting ADL's and functional activities. Patient will benefit from PT to address problems identified.    Stability/Clinical Decision Making Stable/Uncomplicated    Clinical Decision Making Low    Rehab Potential Good    PT Frequency 2x / week    PT Duration 6 weeks    PT Treatment/Interventions ADLs/Self Care Home Management;Aquatic Therapy;Cryotherapy;Electrical Stimulation;Iontophoresis 4mg /ml Dexamethasone;Moist Heat;Ultrasound;Functional mobility training;Therapeutic activities;Therapeutic exercise;Neuromuscular re-education;Patient/family education;Manual techniques;Dry needling;Taping    PT Next Visit Plan review and progress exercises; trial of DN/manual work Lt lumbar/QL/lats, Rt posterior hip, Lt > Rt hip flexors;  trial of TENS for home purchase; postural correction; core stabilization and strengthening    PT Home Exercise Plan [email or web address]    Consulted and Agree with Plan of Care Patient             Patient will benefit from skilled therapeutic intervention in order to improve the following deficits and impairments:  Decreased range of motion, Increased fascial restricitons, Decreased activity tolerance, Pain, Increased muscle spasms, Hypomobility, Impaired flexibility, Improper body mechanics, Decreased mobility, Decreased strength, Postural dysfunction  Visit Diagnosis: Chronic bilateral low back pain with right-sided sciatica  Muscle weakness (generalized)  Other symptoms and signs involving the musculoskeletal system  Abnormal posture     Problem List Patient Active Problem List   Diagnosis Date Noted   Restrictive lung disease 05/15/2021   S/P stroke due to cerebrovascular disease 09/17/2018   Primary osteoarthritis of left hip 07/08/2017   Primary osteoarthritis of both knees 02/25/2017   Lumbar spondylosis 12/03/2016   Internal derangement of left knee 10/09/2016   History of breast  cancer 11/09/2012   Benign hematuria 07/05/2010   POSTMENOPAUSAL STATUS 03/29/2010   IMPAIRED FASTING GLUCOSE 12/11/2008   LEG PAIN, BILATERAL 01/20/2008   HIP PAIN, RIGHT 01/05/2008   Disorder of bone and cartilage 02/26/2007   Hypothyroidism 04/28/2006   Essential hypertension 04/28/2006   MITRAL VALVE DISORDER 04/28/2006   PHYSICAL THERAPY DISCHARGE SUMMARY  Visits from Start of Care: 1  Current functional level related to goals / functional outcomes: Eval only   Remaining deficits: See above   Education / Equipment: HEP   Patient agrees to discharge. Patient goals were not met. Patient is being discharged due to not returning since the last visit.  Kathleen [name], PT,DPT03/10/231:39 PM   Kathleen [name], PT, MPH  07/26/2021, 9:24 AM  [facility] [place], [place], [address] Phone: [phone]   Fax:  [phone]  Name: Kathleen [name] MRN: [id number] Date of Birth: 12-Jun-1941

## 2021-07-27 DIAGNOSIS — L03011 Cellulitis of right finger: Secondary | ICD-10-CM | POA: Diagnosis not present

## 2021-07-29 ENCOUNTER — Other Ambulatory Visit: Payer: Self-pay

## 2021-07-29 DIAGNOSIS — R634 Abnormal weight loss: Secondary | ICD-10-CM

## 2021-07-29 NOTE — Telephone Encounter (Signed)
Referral to Nutritionist placed. Left message for patient to return call to discuss referral to counselor. Patient has appt. Scheduled 07/30/21.

## 2021-07-30 ENCOUNTER — Encounter: Payer: Self-pay | Admitting: Family Medicine

## 2021-07-30 ENCOUNTER — Ambulatory Visit (INDEPENDENT_AMBULATORY_CARE_PROVIDER_SITE_OTHER): Payer: Medicare HMO | Admitting: Family Medicine

## 2021-07-30 ENCOUNTER — Other Ambulatory Visit: Payer: Self-pay

## 2021-07-30 VITALS — BP 140/73 | HR 62 | Temp 98.0°F | Resp 18 | Ht 60.0 in | Wt 114.0 lb

## 2021-07-30 DIAGNOSIS — R634 Abnormal weight loss: Secondary | ICD-10-CM | POA: Insufficient documentation

## 2021-07-30 DIAGNOSIS — I10 Essential (primary) hypertension: Secondary | ICD-10-CM | POA: Diagnosis not present

## 2021-07-30 DIAGNOSIS — E039 Hypothyroidism, unspecified: Secondary | ICD-10-CM

## 2021-07-30 DIAGNOSIS — L03011 Cellulitis of right finger: Secondary | ICD-10-CM | POA: Diagnosis not present

## 2021-07-30 LAB — TSH: TSH: 2.24 mIU/L (ref 0.40–4.50)

## 2021-07-30 NOTE — Assessment & Plan Note (Signed)
Abnormal weight loss-I am happy that her weight has actually been stable over the last 4 weeks for the first time in several months.  We discussed the importance of really taking time to try to eat lunch even if she does not have much of an appetite so instead of waiting to see if she has time after checking on her husband and daughter encouraged her to try to go ahead and eat for the first 10 minutes of her break and then reserving the last 20 minutes for phone calls.  That way she prioritizes getting food and nutrition in just reminded her that that will really make a difference in how she feels that she is working through the afternoon as far as her energy levels, headaches etc. and also just being able to increase her daily caloric intake so that she is not continuing to lose weight.  She is also now engaged with physical therapy and so really wants to get stronger and work on muscle strengthening so just discussed the importance of doing those exercises that they encourage her to do at home to really build up her strength.

## 2021-07-30 NOTE — Progress Notes (Signed)
Acute Office Visit  Subjective:    Patient ID: Kathleen [name], female    DOB: 28-Jul-1940, 81 y.o.   MRN: [id number]  Chief Complaint  Patient presents with   Finger Concern    Right middle finger swollen/painful. 3-4 days. Patient currently taking Clindamycin     HPI Patient is in today for   Weight loss -we have placed a nutrition referral for her.  Her weight has been stable over the last 4 weeks which is really reassuring.  She is does typically eat breakfast and dinner but often times skips lunch she says it works she usually gets about a 30-minute break.  And she will usually use most of her break to talk to her husband to check on him and sometimes call her daughter and says she is only left with a couple minutes and might take a couple of bites.  She does occasionally try to drink an Ensure or boost.  She was seen at urgent care over the weekend, for infection on her right middle finger and given clindamycin. She has been on it for about 3 days.  Initially it started as a pimple she does not member a cut.  She does feel like it is a little bit better today compared to yesterday.  No active drainage.  Hypothyroidism - Taking medication regularly in the AM away from food and vitamins, etc. No recent change to skin, hair, or energy levels.  She currently takes a half a tab on Tuesdays and Saturdays and a whole tab the other 5 days a week.   Past Medical History:  Diagnosis Date   Cancer ([place])    Hx RT breast infiltrated ductal CA previously on tamoxifen and armidex    Herpes simplex    lt eye   Hypertension    Kidney stones    history   MVP (mitral valve prolapse)    asymptomatic   Personal history of chemotherapy    Personal history of radiation therapy     Past Surgical History:  Procedure Laterality Date   ABDOMINAL HYSTERECTOMY     partial   BREAST BIOPSY     BREAST LUMPECTOMY Right 2000   RT   CERVICAL LAMINECTOMY  1986   SHOULDER SURGERY     rotator cuff repair    throidectomy  1988   For cancer     Family History  Problem Relation Age of Onset   Breast cancer Mother     Social History   Socioeconomic History   Marital status: Married    Spouse name: Patrick Jupiter   Number of children: 1   Years of education: 12   Highest education level: 12th grade  Occupational History   Occupation: Working full-time at [employer].  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  Other Topics Concern   Not on file  Social History Narrative   Lives with her husband. She has one adopted daughter and two grandchildren. She has been working a lot but she enjoys Psychiatric nurse.    Social Determinants of Health   Financial Resource Strain: Low Risk    Difficulty of Paying Living Expenses: Not hard at all  Food  Insecurity: No Food Insecurity   Worried About Charity fundraiser in the Last Year: Never true   Ran Out of Food in the Last Year: Never true  Transportation Needs: No Transportation Needs   Lack of Transportation (Medical): No   Lack of Transportation (Non-Medical): No  Physical Activity: Inactive   Days of Exercise per Week: 0 days   Minutes of Exercise per Session: 0 min  Stress: No Stress Concern Present   Feeling of Stress : Only a little  Social Connections: Moderately Isolated   Frequency of Communication with Friends and Family: More than three times a week   Frequency of Social Gatherings with Friends and Family: Three times a week   Attends Religious Services: Never   Active Member of Clubs or Organizations: No   Attends Archivist Meetings: Never   Marital Status: Married  Human resources officer Violence: Not At Risk   Fear of Current or Ex-Partner: No   Emotionally  Abused: No   Physically Abused: No   Sexually Abused: No    Outpatient Medications Prior to Visit  Medication Sig Dispense Refill   Ascorbic Acid (VITAMIN C) 100 MG tablet Take 100 mg by mouth daily.     aspirin EC 81 MG tablet Take 81 mg by mouth daily.     atorvastatin (LIPITOR) 80 MG tablet Take 1 tablet (80 mg total) by mouth daily. 90 tablet 3   cholecalciferol (VITAMIN D3) 25 MCG (1000 UNIT) tablet Take 4,000 Units by mouth daily.     clindamycin (CLEOCIN) 300 MG capsule Take 300 mg by mouth every 6 (six) hours.     Coenzyme Q10 (COQ10 PO) Take by mouth.     DHA-EPA-Vit B6-B12-Folic Acid (CARDIOVID PLUS PO) Take by mouth.     Ferrous Sulfate (IRON PO) Take by mouth.     ibuprofen (ADVIL) 800 MG tablet TAKE ONE TABLET EVERY 8 HOURS AS NEEDED 60 tablet 1   levothyroxine (SYNTHROID) 75 MCG tablet TAKE ONE (1) TABLET BY MOUTH EACH DAY 90 tablet 2   metoprolol succinate (TOPROL-XL) 25 MG 24 hr tablet Take 1 tablet (25 mg total) by mouth at bedtime. 90 tablet 1   Omega-3 Fatty Acids (OMEGA 3 500 PO) Take by mouth.     TURMERIC PO Take by mouth.     valACYclovir (VALTREX) 500 MG tablet TAKE ONE (1) TABLET BY MOUTH EACH DAY 30 tablet 6   Zinc Sulfate (ZINC 15 PO) Take by mouth.     No facility-administered medications prior to visit.    Allergies  Allergen Reactions   Streptomycin Other (See Comments)    Passed out   Amlodipine Other (See Comments)    Dizziness on 73m    Codeine     Other reaction(s): Other (See Comments)   Elemental Sulfur     Other reaction(s): Other (See Comments) Pt not sure   Penicillins    Sulfa Antibiotics    Sulfonamide Derivatives    Benicar [Olmesartan] Other (See Comments)    Diizzy   Hctz [Hydrochlorothiazide] Other (See Comments)    Vertigo, dizziness.    Lisinopril Other (See Comments)    lightheaded    Review of Systems     Objective:    Physical Exam Vitals and nursing note reviewed.  Constitutional:      Appearance: She is  well-developed.  HENT:     Head: Normocephalic and atraumatic.  Cardiovascular:     Rate and Rhythm: Normal rate and regular rhythm.  Heart sounds: Normal heart sounds.  Pulmonary:     Effort: Pulmonary effort is normal.     Breath sounds: Normal breath sounds.  Skin:    General: Skin is warm and dry.  Neurological:     Mental Status: She is alert and oriented to person, place, and time.  Psychiatric:        Behavior: Behavior normal.    BP 140/73    Pulse 62    Temp 98 F (36.7 C)    Resp 18    Ht 5' (1.524 m)    Wt 114 lb (51.7 kg)    SpO2 97%    BMI 22.26 kg/m  Wt Readings from Last 3 Encounters:  07/30/21 114 lb (51.7 kg)  07/03/21 114 lb (51.7 kg)  05/15/21 117 lb (53.1 kg)    Health Maintenance Due  Topic Date Due   Zoster Vaccines- Shingrix (1 of 2) Never done   COVID-19 Vaccine (4 - Booster for Pfizer series) 09/04/2020    There are no preventive care reminders to display for this patient.   Lab Results  Component Value Date   TSH 1.33 09/27/2020   Lab Results  Component Value Date   WBC 7.5 04/03/2021   HGB 13.9 04/03/2021   HCT 41.5 04/03/2021   MCV 91.2 04/03/2021   PLT 225 04/03/2021   Lab Results  Component Value Date   NA 140 04/03/2021   K 4.1 04/03/2021   CO2 30 04/03/2021   GLUCOSE 101 (H) 04/03/2021   BUN 22 04/03/2021   CREATININE 0.85 04/03/2021   BILITOT 0.8 04/03/2021   ALKPHOS 58 02/23/2017   AST 35 04/03/2021   ALT 23 07/03/2021   PROT 6.8 04/03/2021   ALBUMIN 4.0 02/23/2017   CALCIUM 9.6 04/03/2021   EGFR 69 04/03/2021   Lab Results  Component Value Date   CHOL 141 04/03/2021   Lab Results  Component Value Date   HDL 60 04/03/2021   Lab Results  Component Value Date   LDLCALC 68 04/03/2021   Lab Results  Component Value Date   TRIG 46 04/03/2021   Lab Results  Component Value Date   CHOLHDL 2.4 04/03/2021   Lab Results  Component Value Date   HGBA1C 6.0 (H) 04/03/2021       Assessment & Plan:    Problem List Items Addressed This Visit       Cardiovascular and Mediastinum   Essential hypertension    Blood pressure looks much better today though still little borderline but much improved from about 4 weeks ago.  We will continue to monitor.        Endocrine   Hypothyroidism    Due to recheck thyroid level.  We may have inadvertently left it off her labs from September.  She feels like things are pretty well regulated.  Taking half a tab 2 days a week and a whole tab the other 5.      Relevant Orders   TSH     Other   Weight loss - Primary    Abnormal weight loss-I am happy that her weight has actually been stable over the last 4 weeks for the first time in several months.  We discussed the importance of really taking time to try to eat lunch even if she does not have much of an appetite so instead of waiting to see if she has time after checking on her husband and daughter encouraged her to try to go ahead and  eat for the first 10 minutes of her break and then reserving the last 20 minutes for phone calls.  That way she prioritizes getting food and nutrition in just reminded her that that will really make a difference in how she feels that she is working through the afternoon as far as her energy levels, headaches etc. and also just being able to increase her daily caloric intake so that she is not continuing to lose weight.  She is also now engaged with physical therapy and so really wants to get stronger and work on muscle strengthening so just discussed the importance of doing those exercises that they encourage her to do at home to really build up her strength.      Other Visit Diagnoses     Hypertension, unspecified type       Cellulitis of middle finger, right          Cellulitis of right middle finger-she does feel like it is a little bit better today still some mild erythema and swelling.  No streaking.  No open wound or drainage.  Make sure to continue with the  clindamycin and if not improving over the next few days then please let me know.   No orders of the defined types were placed in this encounter.    Beatrice Lecher, MD

## 2021-07-30 NOTE — Assessment & Plan Note (Signed)
Blood pressure looks much better today though still little borderline but much improved from about 4 weeks ago.  We will continue to monitor.

## 2021-07-30 NOTE — Assessment & Plan Note (Signed)
Due to recheck thyroid level.  We may have inadvertently left it off her labs from September.  She feels like things are pretty well regulated.  Taking half a tab 2 days a week and a whole tab the other 5.

## 2021-07-31 ENCOUNTER — Encounter: Payer: Medicare HMO | Admitting: Rehabilitative and Restorative Service Providers"

## 2021-07-31 NOTE — Progress Notes (Signed)
Call patient, thyroid level looks good at 2.2.

## 2021-08-02 ENCOUNTER — Encounter: Payer: Medicare HMO | Admitting: Rehabilitative and Restorative Service Providers"

## 2021-08-06 ENCOUNTER — Ambulatory Visit: Payer: Medicare HMO | Admitting: Rehabilitative and Restorative Service Providers"

## 2021-08-06 ENCOUNTER — Telehealth: Payer: Self-pay | Admitting: Family Medicine

## 2021-08-06 DIAGNOSIS — M47816 Spondylosis without myelopathy or radiculopathy, lumbar region: Secondary | ICD-10-CM

## 2021-08-06 DIAGNOSIS — M545 Low back pain, unspecified: Secondary | ICD-10-CM

## 2021-08-06 NOTE — Telephone Encounter (Signed)
Pt  has started Physical Therapy already and finds herself in really bad pain with her back and is wondering if she should go ahead and proceed with a referral to a Back Doctor for this? Please advise patient. Thank you

## 2021-08-06 NOTE — Telephone Encounter (Signed)
Okay for referral to either Dr. Darene Lamer here in our office or the orthopedist which ever she would prefer.

## 2021-08-07 NOTE — Telephone Encounter (Signed)
Referral to ortho placed per pt request.  Pt is aware that someone from their office will call to schedule an appt.  Charyl Bigger, CMA

## 2021-08-09 ENCOUNTER — Ambulatory Visit: Payer: Medicare HMO | Admitting: Rehabilitative and Restorative Service Providers"

## 2021-08-20 ENCOUNTER — Encounter: Payer: Self-pay | Admitting: Orthopaedic Surgery

## 2021-08-20 ENCOUNTER — Ambulatory Visit: Payer: Medicare HMO | Admitting: Orthopaedic Surgery

## 2021-08-20 ENCOUNTER — Other Ambulatory Visit: Payer: Self-pay

## 2021-08-20 VITALS — BP 176/93 | HR 79 | Ht 60.0 in | Wt 114.0 lb

## 2021-08-20 DIAGNOSIS — M47816 Spondylosis without myelopathy or radiculopathy, lumbar region: Secondary | ICD-10-CM

## 2021-08-20 NOTE — Progress Notes (Signed)
Office Visit Note   Patient: Kathleen [name]           Date of Birth: [birth year]           MRN: [id number] Visit Date: 08/20/2021              Requested by: Hali Marry, [address] [place],  [place] [id number] PCP: Hali Marry, MD   Assessment & Plan: Visit Diagnoses:  1. Lumbar spondylosis     Plan: We will proceed with  lumbar MRI scan rule out compression with her weakness in the left leg.  History of breast cancer remote 2001.  Weakness in her left leg with lateral foot numbness.  Office follow-up after MRI.  Follow-Up Instructions: No follow-ups on file.   Orders:  Orders Placed This Encounter  Procedures   MR Lumbar Spine w/o contrast   No orders of the defined types were placed in this encounter.     Procedures: No procedures performed   Clinical Data: No additional findings.   Subjective: Chief Complaint  Patient presents with   Lower Back - Pain    HPI new patient visit with 81 year old female with low back pain.  She also has noticed right long finger swelling slight erythema adjacent to the PIP joint on the radial aspect and she states she was told that it might be infected.  She has had no fever chills good range of motion of the digit.  She has arthritis in the DIP joints of multiple fingers.  Back no symptoms did not begin with injury.  She has tingling in her right leg numbness down to the ankle.  She is also noticed weakness in the left leg with walking.  She has been losing some weight works full-time and is on her feet all the time and states she does not want to quit.  She gets some relief with sitting she has tried heat previous injections did not help.  She was in therapy had to stop due to care of her husband.  Epidurals were 3 years ago.  Pleasant husband is older and has significant health problems.  Patient has known scoliosis.  She does better if she leans on an object.  History of breast cancer 2001.  She  gets relief with sitting.  Review of Systems possible breast cancer prediabetes impaired glucose fasting.  Previous CVA restrictive lung disease mitral valve disorder.  All other systems noncontributory HPI.   Objective: Vital Signs: BP (!) 176/93    Pulse 79    Ht 5' (1.524 m)    Wt 114 lb (51.7 kg)    BMI 22.26 kg/m   Physical Exam Constitutional:      Appearance: She is well-developed.  HENT:     Head: Normocephalic.     Right Ear: External ear normal.     Left Ear: External ear normal. There is no impacted cerumen.  Eyes:     Pupils: Pupils are equal, round, and reactive to light.  Neck:     Thyroid: No thyromegaly.     Trachea: No tracheal deviation.  Cardiovascular:     Rate and Rhythm: Normal rate.  Pulmonary:     Effort: Pulmonary effort is normal.  Abdominal:     Palpations: Abdomen is soft.  Musculoskeletal:     Cervical back: No rigidity.  Skin:    General: Skin is warm and dry.  Neurological:     Mental Status: She is alert  and oriented to person, place, and time.  Psychiatric:        Behavior: Behavior normal.    Ortho Exam patient has some static notch tenderness worse in the left and right minimal trochanteric bursal tenderness positive straight leg raising 90 degrees.  Patient has significant levoconvex lumbar curvature.  Patient has good flexion-extension PIP joint right long finger.  Slight swelling suggestive of ganglion PIP joint.  No triggering over the flexor tendon full extension good flexion of the digit.   Specialty Comments:  No specialty comments available.  Imaging: Narrative & Impression  CLINICAL DATA:  Lower back pain   EXAM: LUMBAR SPINE - COMPLETE 4+ VIEW   COMPARISON:  MRI lumbar spine 01/19/2017   FINDINGS: Levoconvex lumbar curvature. There is mild to moderate multilevel degenerative disc disease, worst at L2-L3, L3-L4, and L5-S1. There is moderate multilevel facet arthropathy, left greater than right. There is grade 1  anterolisthesis at L4-L5. Trace anterolisthesis at L3-L4. No evidence of lumbar spine fracture.   IMPRESSION: Levoconvex lumbar curvature.   Mild to moderate multilevel degenerative disc disease and moderate multilevel facet arthropathy.   Grade 1 anterolisthesis at L4-L5.  Trace anterolisthesis at L3-L4.     Electronically Signed   By: Maurine Simmering M.D.   On: 07/04/2021 10:20       PMFS History: Patient Active Problem List   Diagnosis Date Noted   Weight loss 07/30/2021   Restrictive lung disease 05/15/2021   S/P stroke due to cerebrovascular disease 09/17/2018   Primary osteoarthritis of left hip 07/08/2017   Primary osteoarthritis of both knees 02/25/2017   Lumbar spondylosis 12/03/2016   Internal derangement of left knee 10/09/2016   History of breast cancer 11/09/2012   Benign hematuria 07/05/2010   POSTMENOPAUSAL STATUS 03/29/2010   IMPAIRED FASTING GLUCOSE 12/11/2008   LEG PAIN, BILATERAL 01/20/2008   HIP PAIN, RIGHT 01/05/2008   Disorder of bone and cartilage 02/26/2007   Hypothyroidism 04/28/2006   Essential hypertension 04/28/2006   MITRAL VALVE DISORDER 04/28/2006   Past Medical History:  Diagnosis Date   Cancer ([place])    Hx RT breast infiltrated ductal CA previously on tamoxifen and armidex    Herpes simplex    lt eye   Hypertension    Kidney stones    history   MVP (mitral valve prolapse)    asymptomatic   Personal history of chemotherapy    Personal history of radiation therapy     Family History  Problem Relation Age of Onset   Breast cancer Mother     Past Surgical History:  Procedure Laterality Date   ABDOMINAL HYSTERECTOMY     partial   BREAST BIOPSY     BREAST LUMPECTOMY Right 2000   RT   CERVICAL LAMINECTOMY  1986   SHOULDER SURGERY     rotator cuff repair   throidectomy  1988   For cancer    Social History   Occupational History   Occupation: Working full-time at [employer].  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

## 2021-08-26 ENCOUNTER — Telehealth: Payer: Self-pay | Admitting: Orthopaedic Surgery

## 2021-08-26 NOTE — Telephone Encounter (Signed)
Called patient left message on voicemail to return call to schedule an MRI review with Dr. Lorin Mercy     MRI is scheduled for 09/06/2021

## 2021-09-06 ENCOUNTER — Ambulatory Visit
Admission: RE | Admit: 2021-09-06 | Discharge: 2021-09-06 | Disposition: A | Discharge status: Home / Self Care | Payer: Medicare HMO | Source: Ambulatory Visit | Attending: Orthopaedic Surgery | Admitting: Orthopaedic Surgery

## 2021-09-06 ENCOUNTER — Other Ambulatory Visit: Payer: Self-pay

## 2021-09-06 DIAGNOSIS — M48061 Spinal stenosis, lumbar region without neurogenic claudication: Secondary | ICD-10-CM | POA: Diagnosis not present

## 2021-09-06 DIAGNOSIS — M545 Low back pain, unspecified: Secondary | ICD-10-CM | POA: Diagnosis not present

## 2021-09-06 DIAGNOSIS — M47816 Spondylosis without myelopathy or radiculopathy, lumbar region: Secondary | ICD-10-CM

## 2021-09-06 DIAGNOSIS — M2578 Osteophyte, vertebrae: Secondary | ICD-10-CM | POA: Diagnosis not present

## 2021-09-10 ENCOUNTER — Ambulatory Visit: Payer: Medicare HMO | Admitting: Orthopaedic Surgery

## 2021-09-10 ENCOUNTER — Encounter: Payer: Self-pay | Admitting: Orthopaedic Surgery

## 2021-09-10 ENCOUNTER — Other Ambulatory Visit: Payer: Self-pay

## 2021-09-10 VITALS — BP 145/76 | HR 85 | Ht 60.0 in | Wt 114.0 lb

## 2021-09-10 DIAGNOSIS — M47816 Spondylosis without myelopathy or radiculopathy, lumbar region: Secondary | ICD-10-CM | POA: Diagnosis not present

## 2021-09-10 DIAGNOSIS — M48061 Spinal stenosis, lumbar region without neurogenic claudication: Secondary | ICD-10-CM | POA: Diagnosis not present

## 2021-09-12 DIAGNOSIS — M48061 Spinal stenosis, lumbar region without neurogenic claudication: Secondary | ICD-10-CM | POA: Insufficient documentation

## 2021-09-12 NOTE — Progress Notes (Signed)
Office Visit Note   Patient: Kathleen [name]           Date of Birth: Oct 21, 1940           MRN: [id number] Visit Date: 09/10/2021              Requested by: Hali Marry, [address],  [place] [id number] PCP: Hali Marry, MD   Assessment & Plan: Visit Diagnoses: Lumbar foraminal stenosis with scoliosis  Plan: Patient has multilevel changes with her scoliosis with convex to the left apex at L2.  Some new lateral recess foraminal stenosis at L5-S1 on the left.  Lateral recess narrowing multiple levels.  We discussed correction of her curvature and multilevel foraminal stenosis would be a significant undertaking.  Her foraminal stenosis is new and she is having increased left-sided symptoms.  We will set her up for an injection with Dr. Ernestina Patches for the new area of narrowing and compression on the left at L5-S1.  She can follow-up with me several weeks after the injection.   Follow-Up Instructions: Return if symptoms worsen or fail to improve.   Orders:  No orders of the defined types were placed in this encounter.  No orders of the defined types were placed in this encounter.     Procedures: No procedures performed   Clinical Data: No additional findings.   Subjective: Chief Complaint  Patient presents with   Lower Back - Pain, Follow-up    MRI lumbar review    HPI 81 year old female returns with ongoing problems with low back pain.  No history of lumbar injury she has pain in her back radiates down more in her right than left leg with numbness down to the ankle.  She noticed weakness in left leg with walking.  She gets relief with sitting.  Previous epidurals with temporary relief.  History of prediabetes previous CVA.  She notes with more activity she tends to lean to the side.  Patient has significant scoliosis.  Patient has to take care of her husband.  She still working on a daily basis.  MRI scan has been obtained and is  available for review.  Review of Systems   Objective: Vital Signs: BP (!) 145/76    Pulse 85    Ht 5' (1.524 m)    Wt 114 lb (51.7 kg)    BMI 22.26 kg/m   Physical Exam  Ortho Exam  Specialty Comments:  No specialty comments available.  Imaging: No results found. Narrative & Impression  CLINICAL DATA:  Low back pain, symptoms persist with greater than 6 weeks treatment. History of breast and thyroid cancer. Low back pain radiating to the right leg which is worsening.   EXAM: MRI LUMBAR SPINE WITHOUT CONTRAST   TECHNIQUE: Multiplanar, multisequence MR imaging of the lumbar spine was performed. No intravenous contrast was administered.   COMPARISON:  01/19/2017   FINDINGS: Segmentation:  5 lumbar type vertebral bodies.   Alignment: Scoliotic curvature convex to the left with the apex at L2. 2 mm degenerative anterolisthesis at L3-4, L4-5 and L5-S1.   Vertebrae: No fracture or focal bone lesion. Some discogenic endplate edema on the left at L5-S1 could relate to regional pain.   Conus medullaris and cauda equina: Conus extends to the L1-2 level. Conus and cauda equina appear normal.   Paraspinal and other soft tissues: Multiple parapelvic renal cysts.   Disc levels:   Minimal disc bulges and mild facet arthritis in  the region from T11-12 through L1-2. No compressive stenosis.   L2-3: Disc degeneration more pronounced towards the right with osteophytes and bulging of the disc. Mild facet hypertrophy more on the right. Mild stenosis of the right lateral recess and intervertebral foramen on the right, slightly worsened compared to 2018.   L3-4: 2 mm anterolisthesis. Endplate osteophytes and bulging of the disc. Bilateral facet hypertrophy. Moderate stenosis at this level with narrowing of both lateral recesses and neural foramina. Slight worsening since 2018.   L4-5: Bilateral facet degeneration with 2 mm of anterolisthesis. Bulging of the disc more prominent  towards the left. Moderate stenosis at this level with narrowing of both lateral recesses, left more than right. Foraminal narrowing on the left but without visible compression of the exiting L4 nerve. Slight worsening since 2018.   L5-S1: Endplate osteophytes and bulging of the disc towards the left. Facet and ligamentous hypertrophy more pronounced on the left. Narrowing of the subarticular lateral recess on the left and intervertebral foramen on the left that could possibly affect the left L5 and S1 nerves. This has worsened since the previous study. Discogenic endplate edema at this level could also contribute to back pain.   IMPRESSION: Scoliotic curvature convex to the left with the apex at L2.   L2-3: Mild narrowing of the right lateral recess and intervertebral foramen on the right at L2-3 but without definite neural compression. Slight worsening since 2018.   L3-4: Narrowing of both lateral recesses and neural foramina which could possibly be symptomatic, slightly worsened since 2018.   L4-5: Lateral recess and foraminal narrowing left more than right which could possibly be symptomatic, slightly worsened since 2018.   L5-S1: New left lateral recess and foraminal stenosis that could compress the left L5 and S1 nerves. Discogenic endplate marrow changes which could contribute to low back pain.     Electronically Signed   By: Nelson Chimes M.D.   On: 09/07/2021 17:42       PMFS History: Patient Active Problem List   Diagnosis Date Noted   Lumbar foraminal stenosis 09/12/2021   Weight loss 07/30/2021   Restrictive lung disease 05/15/2021   S/P stroke due to cerebrovascular disease 09/17/2018   Primary osteoarthritis of left hip 07/08/2017   Primary osteoarthritis of both knees 02/25/2017   Lumbar spondylosis 12/03/2016   Internal derangement of left knee 10/09/2016   History of breast cancer 11/09/2012   Benign hematuria 07/05/2010   POSTMENOPAUSAL STATUS  03/29/2010   IMPAIRED FASTING GLUCOSE 12/11/2008   LEG PAIN, BILATERAL 01/20/2008   HIP PAIN, RIGHT 01/05/2008   Disorder of bone and cartilage 02/26/2007   Hypothyroidism 04/28/2006   Essential hypertension 04/28/2006   MITRAL VALVE DISORDER 04/28/2006   Past Medical History:  Diagnosis Date   Cancer ([place])    Hx RT breast infiltrated ductal CA previously on tamoxifen and armidex    Herpes simplex    lt eye   Hypertension    Kidney stones    history   MVP (mitral valve prolapse)    asymptomatic   Personal history of chemotherapy    Personal history of radiation therapy     Family History  Problem Relation Age of Onset   Breast cancer Mother     Past Surgical History:  Procedure Laterality Date   ABDOMINAL HYSTERECTOMY     partial   BREAST BIOPSY     BREAST LUMPECTOMY Right 2000   RT   CERVICAL LAMINECTOMY  1986   SHOULDER SURGERY  rotator cuff repair   throidectomy  1988   For cancer    Social History   Occupational History   Occupation: Working full-time at [employer].  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

## 2021-09-13 ENCOUNTER — Telehealth: Payer: Self-pay | Admitting: Orthopaedic Surgery

## 2021-09-13 ENCOUNTER — Emergency Department (INDEPENDENT_AMBULATORY_CARE_PROVIDER_SITE_OTHER): Payer: Medicare HMO

## 2021-09-13 ENCOUNTER — Telehealth: Payer: Self-pay | Admitting: Radiology

## 2021-09-13 ENCOUNTER — Emergency Department
Admission: EM | Admit: 2021-09-13 | Discharge: 2021-09-13 | Disposition: A | Discharge status: Home / Self Care | Payer: Medicare HMO | Source: Home / Self Care

## 2021-09-13 ENCOUNTER — Other Ambulatory Visit: Payer: Self-pay

## 2021-09-13 DIAGNOSIS — M79602 Pain in left arm: Secondary | ICD-10-CM

## 2021-09-13 DIAGNOSIS — M48061 Spinal stenosis, lumbar region without neurogenic claudication: Secondary | ICD-10-CM

## 2021-09-13 DIAGNOSIS — M25712 Osteophyte, left shoulder: Secondary | ICD-10-CM | POA: Diagnosis not present

## 2021-09-13 DIAGNOSIS — M79622 Pain in left upper arm: Secondary | ICD-10-CM

## 2021-09-13 DIAGNOSIS — W19XXXA Unspecified fall, initial encounter: Secondary | ICD-10-CM

## 2021-09-13 DIAGNOSIS — M79652 Pain in left thigh: Secondary | ICD-10-CM | POA: Diagnosis not present

## 2021-09-13 DIAGNOSIS — M25552 Pain in left hip: Secondary | ICD-10-CM | POA: Diagnosis not present

## 2021-09-13 DIAGNOSIS — M25512 Pain in left shoulder: Secondary | ICD-10-CM

## 2021-09-13 DIAGNOSIS — S4992XA Unspecified injury of left shoulder and upper arm, initial encounter: Secondary | ICD-10-CM | POA: Diagnosis not present

## 2021-09-13 DIAGNOSIS — M25562 Pain in left knee: Secondary | ICD-10-CM

## 2021-09-13 MED ORDER — HYDROCODONE-ACETAMINOPHEN 5-325 MG PO TABS: 1.0000 | ORAL_TABLET | Freq: Four times a day (QID) | ORAL | 0 refills | Status: DC | PRN

## 2021-09-13 NOTE — Telephone Encounter (Signed)
Patient called. She fell. Would like to know if she could come Tuesday to see Dr. Lorin Mercy. Her call back number is [phone]

## 2021-09-13 NOTE — Telephone Encounter (Signed)
I called, worked patient in to be seen. She is currently at Urgent Care being seen about her arm.

## 2021-09-13 NOTE — ED Triage Notes (Signed)
Pt states that she fell and injured her left arm, leg and knee. X1 day

## 2021-09-13 NOTE — ED Provider Notes (Signed)
[name]    CSN: [id number] Arrival date & time: 09/13/21  1443      History   Chief Complaint Chief Complaint  Patient presents with   Arm Injury    Left arm injury x1 day   Leg Injury    Left leg and knee injury x1 day    HPI Janee Ureste is a 81 y.o. female.   HPI 81 year old female presents with left arm injury and left leg/left knee injury each for 1 day secondary to fall.  PMH significant for cancer and HTN.  Past Medical History:  Diagnosis Date   Cancer [facility])    Hx RT breast infiltrated ductal CA previously on tamoxifen and armidex    Herpes simplex    lt eye   Hypertension    Kidney stones    history   MVP (mitral valve prolapse)    asymptomatic   Personal history of chemotherapy    Personal history of radiation therapy     Patient Active Problem List   Diagnosis Date Noted   Lumbar foraminal stenosis 09/12/2021   Weight loss 07/30/2021   Restrictive lung disease 05/15/2021   S/P stroke due to cerebrovascular disease 09/17/2018   Primary osteoarthritis of left hip 07/08/2017   Primary osteoarthritis of both knees 02/25/2017   Lumbar spondylosis 12/03/2016   Internal derangement of left knee 10/09/2016   History of breast cancer 11/09/2012   Benign hematuria 07/05/2010   POSTMENOPAUSAL STATUS 03/29/2010   IMPAIRED FASTING GLUCOSE 12/11/2008   LEG PAIN, BILATERAL 01/20/2008   HIP PAIN, RIGHT 01/05/2008   Disorder of bone and cartilage 02/26/2007   Hypothyroidism 04/28/2006   Essential hypertension 04/28/2006   MITRAL VALVE DISORDER 04/28/2006    Past Surgical History:  Procedure Laterality Date   ABDOMINAL HYSTERECTOMY     partial   BREAST BIOPSY     BREAST LUMPECTOMY Right 2000   RT   CERVICAL LAMINECTOMY  1986   SHOULDER SURGERY     rotator cuff repair   throidectomy  1988   For cancer     OB History   No obstetric history on file.      Home Medications    Prior to Admission medications   Medication Sig Start  Date End Date Taking? Authorizing Provider  Ascorbic Acid (VITAMIN C) 100 MG tablet Take 100 mg by mouth daily.   Yes [provider]  aspirin EC 81 MG tablet Take 81 mg by mouth daily.   Yes [provider]  atorvastatin (LIPITOR) 80 MG tablet Take 1 tablet (80 mg total) by mouth daily. 04/03/21  Yes Hali Marry, MD  cholecalciferol (VITAMIN D3) 25 MCG (1000 UNIT) tablet Take 4,000 Units by mouth daily.   Yes [provider]  clindamycin (CLEOCIN) 300 MG capsule Take 300 mg by mouth every 6 (six) hours. 07/27/21  Yes [provider]  Coenzyme Q10 (COQ10 PO) Take by mouth.   Yes [provider]  DHA-EPA-Vit B6-B12-Folic Acid (CARDIOVID PLUS PO) Take by mouth.   Yes [provider]  Ferrous Sulfate (IRON PO) Take by mouth.   Yes [provider]  ibuprofen (ADVIL) 800 MG tablet TAKE ONE TABLET EVERY 8 HOURS AS NEEDED 12/10/20  Yes Hali Marry, MD  levothyroxine (SYNTHROID) 75 MCG tablet TAKE ONE (1) TABLET BY MOUTH EACH DAY 03/18/21  Yes Hali Marry, MD  metoprolol succinate (TOPROL-XL) 25 MG 24 hr tablet Take 1 tablet (25 mg total) by mouth at bedtime.  04/03/21  Yes Hali Marry, MD  Omega-3 Fatty Acids (OMEGA 3 500 PO) Take by mouth.   Yes [provider]  TURMERIC PO Take by mouth.   Yes [provider]  valACYclovir (VALTREX) 500 MG tablet TAKE ONE (1) TABLET BY MOUTH EACH DAY 04/03/21  Yes Hali Marry, MD  Zinc Sulfate (ZINC 15 PO) Take by mouth.   Yes [provider]    Family History Family History  Problem Relation Age of Onset   Breast cancer Mother     Social History Social History   Tobacco Use   Smoking status: Never   Smokeless tobacco: Never  Substance Use Topics   Alcohol use: No   Drug use: Never     Allergies   Streptomycin, Amlodipine, Codeine, Elemental sulfur, Penicillins, Sulfa antibiotics, Sulfonamide derivatives, Benicar  [olmesartan], Hctz [hydrochlorothiazide], and Lisinopril   Review of Systems Review of Systems  Musculoskeletal:        Left shoulder pain, left arm pain and left knee pain for 1 day secondary to fall    Physical Exam Triage Vital Signs ED Triage Vitals  Enc Vitals Group     BP --      Pulse Rate 09/13/21 1455 67     Resp 09/13/21 1455 18     Temp 09/13/21 1455 97.9 F (36.6 C)     Temp Source 09/13/21 1455 Oral     SpO2 09/13/21 1455 99 %     Weight 09/13/21 1453 110 lb (49.9 kg)     Height 09/13/21 1453 5' (1.524 m)     Head Circumference --      Peak Flow --      Pain Score 09/13/21 1453 3     Pain Loc --      Pain Edu? --      Excl. in [place]? --    No data found.  Updated Vital Signs Pulse 67    Temp 97.9 F (36.6 C) (Oral)    Resp 18    Ht 5' (1.524 m)    Wt 110 lb (49.9 kg)    SpO2 99%    BMI 21.48 kg/m     Physical Exam Vitals and nursing note reviewed.  Constitutional:      General: She is not in acute distress.    Appearance: Normal appearance. She is normal weight. She is not ill-appearing.  HENT:     Head: Normocephalic and atraumatic.     Mouth/Throat:     Mouth: Mucous membranes are moist.     Pharynx: Oropharynx is clear.  Eyes:     Extraocular Movements: Extraocular movements intact.     Conjunctiva/sclera: Conjunctivae normal.     Pupils: Pupils are equal, round, and reactive to light.  Cardiovascular:     Rate and Rhythm: Normal rate and regular rhythm.     Pulses: Normal pulses.     Heart sounds: Normal heart sounds.  Pulmonary:     Effort: Pulmonary effort is normal.     Breath sounds: Normal breath sounds.  Musculoskeletal:     Cervical back: Normal range of motion and neck supple.     Comments: Left shoulder: TTP over [place] joint, with mild deformity noted humerus riding high, exam limited due to left shoulder pain.  left arm: TTP over superior lateral aspect  Left knee: TTP over medial aspect of patella, with mild soft tissue swelling  noted  Skin:    General: Skin is warm and dry.  Neurological:     General: No focal deficit present.     Mental Status: She is alert and oriented to person, place, and time. Mental status is at baseline.     UC Treatments / Results  Labs (all labs ordered are listed, but only abnormal results are displayed) Labs Reviewed - No data to display  EKG   Radiology DG Shoulder Left  Result Date: 09/13/2021 CLINICAL DATA:  Fall.  Left humerus injury. EXAM: LEFT SHOULDER - 2+ VIEW; LEFT HUMERUS - 2+ VIEW COMPARISON:  None. FINDINGS: Left shoulder: Moderate acromioclavicular joint space narrowing and peripheral osteophytosis. The humeral head is high-riding. Moderate to severe glenohumeral joint space narrowing with moderate peripheral degenerative osteophytosis. Within the limitations of diffuse decreased bone mineralization no definite acute fracture is seen. Left humerus: No acute fracture is identified within the more distal aspect of the left humerus. Mild dextrocurvature of the mid to upper thoracic spine with eccentric left disc space narrowing. IMPRESSION:: IMPRESSION: 1. Moderate acromioclavicular and glenohumeral osteoarthritis. 2. High-riding humeral head suspicious for full-thickness superior rotator cuff tear. 3. Within the limitations of diffuse decreased bone mineralization no definite acute fracture is seen. Electronically Signed   By: Yvonne Kendall M.D.   On: 09/13/2021 15:54   DG Knee Complete 4 Views Left  Result Date: 09/13/2021 CLINICAL DATA:  Left knee and hip pain after fall. EXAM: LEFT KNEE - COMPLETE 4+ VIEW COMPARISON:  None. FINDINGS: Mild medial compartment joint space narrowing. Minimal peripheral medial compartment degenerative osteophytosis. No joint effusion. No acute fracture or dislocation. IMPRESSION: Mild medial compartment osteoarthritis.  No acute fracture. Electronically Signed   By: Yvonne Kendall M.D.   On: 09/13/2021 15:55   DG Humerus Left  Result Date:  09/13/2021 CLINICAL DATA:  Fall.  Left humerus injury. EXAM: LEFT SHOULDER - 2+ VIEW; LEFT HUMERUS - 2+ VIEW COMPARISON:  None. FINDINGS: Left shoulder: Moderate acromioclavicular joint space narrowing and peripheral osteophytosis. The humeral head is high-riding. Moderate to severe glenohumeral joint space narrowing with moderate peripheral degenerative osteophytosis. Within the limitations of diffuse decreased bone mineralization no definite acute fracture is seen. Left humerus: No acute fracture is identified within the more distal aspect of the left humerus. Mild dextrocurvature of the mid to upper thoracic spine with eccentric left disc space narrowing. IMPRESSION:: IMPRESSION: 1. Moderate acromioclavicular and glenohumeral osteoarthritis. 2. High-riding humeral head suspicious for full-thickness superior rotator cuff tear. 3. Within the limitations of diffuse decreased bone mineralization no definite acute fracture is seen. Electronically Signed   By: Yvonne Kendall M.D.   On: 09/13/2021 15:54   DG Femur Min 2 Views Left  Result Date: 09/13/2021 CLINICAL DATA:  Pain after fall EXAM: LEFT FEMUR 2 VIEWS COMPARISON:  Pelvis and left hip radiographs 07/08/2017 FINDINGS: Mild left femoroacetabular joint space narrowing. Moderate heterotopic bone formation of the superior aspect of the left greater trochanter unchanged. Mild medial compartment of the knee joint space narrowing. No acute fracture is seen. No dislocation. IMPRESSION: Mild left femoroacetabular osteoarthritis. No acute fracture is seen within the left femur. Electronically Signed   By: Yvonne Kendall M.D.   On: 09/13/2021 15:58    Procedures Procedures (including critical care time)  Medications Ordered in UC Medications - No data to display  Initial Impression / Assessment and Plan / UC Course  I have reviewed the triage vital signs and the nursing notes.  Pertinent labs & imaging results that were available during my care of the patient  were reviewed by  me and considered in my medical decision making (see chart for details).     MDM: 1.  Fall, initial encounter; 2.  Left arm pain-left humerus x-ray was negative for acute process; 3.  Left knee pain-left knee x-ray revealed mild medial compartment osteoarthritis with no underlying fracture.  Left femur revealed mild left femoroacetabular osteoarthritis arthritis; 4.  Left shoulder pain-left shoulder x-ray revealed moderate AC and GH osteoarthritis; high riding humeral head suspicious of full-thickness superior rotator cuff tear.  Rx'd Norco and Celebrex. Advised/informed patient of x-ray results today.  Advised patient that she will need to follow-up with orthopedic provider regarding left shoulder x-ray results.  [place] provider contact information included with this AVS today.  Advised patient to wear left shoulder sling 24/7 until being evaluated by orthopedic provider.  Advised patient to take medication as directed with food to completion.  Advised patient may use pain medicine for breakthrough pain.  Advised patient if unable to get in with orthopedic provider today please go to [facility] for further evaluation. Final Clinical Impressions(s) / UC Diagnoses   Final diagnoses:  Fall, initial encounter  Left arm pain  Acute pain of left knee  Acute pain of left shoulder     Discharge Instructions      Advised/informed patient of x-ray results today.  Advised patient that she will need to follow-up with orthopedic provider regarding left shoulder x-ray results.  [place] provider contact information included with this AVS today.  Advised patient to wear left shoulder sling 24/7 until being evaluated by orthopedic provider.  Advised patient to take medication as directed with food to completion.  Advised patient may use pain medicine for breakthrough pain.     ED Prescriptions   None    I have reviewed the PDMP during this encounter.    Eliezer Lofts, [place] 09/13/21 1639

## 2021-09-13 NOTE — Telephone Encounter (Signed)
I called pt , new L5-S1 foraminal stenosis on her MRI, lets set her up for ESI with [facility]. thanks  she can ROV several weeks after her ESI thanks    Referral to Dr. Ernestina Patches entered.

## 2021-09-13 NOTE — Discharge Instructions (Addendum)
Advised/informed patient of x-ray results today.  Advised patient that she will need to follow-up with orthopedic provider regarding left shoulder x-ray results.  [place] provider contact information included with this AVS today.  Advised patient to wear left shoulder sling 24/7 until being evaluated by orthopedic provider.  Advised patient may take pain medication (Norco) every 6 hours for left shoulder pain.

## 2021-09-14 ENCOUNTER — Emergency Department (HOSPITAL_COMMUNITY): Payer: Medicare HMO

## 2021-09-14 ENCOUNTER — Other Ambulatory Visit: Payer: Self-pay

## 2021-09-14 ENCOUNTER — Emergency Department (HOSPITAL_COMMUNITY)
Admission: EM | Admit: 2021-09-14 | Discharge: 2021-09-14 | Disposition: A | Discharge status: Home / Self Care | Payer: Medicare HMO | Attending: Emergency Medicine | Admitting: Emergency Medicine

## 2021-09-14 ENCOUNTER — Encounter (HOSPITAL_COMMUNITY): Payer: Self-pay | Admitting: Emergency Medicine

## 2021-09-14 DIAGNOSIS — W101XXA Fall (on)(from) sidewalk curb, initial encounter: Secondary | ICD-10-CM | POA: Diagnosis not present

## 2021-09-14 DIAGNOSIS — Z79899 Other long term (current) drug therapy: Secondary | ICD-10-CM | POA: Insufficient documentation

## 2021-09-14 DIAGNOSIS — Z853 Personal history of malignant neoplasm of breast: Secondary | ICD-10-CM | POA: Insufficient documentation

## 2021-09-14 DIAGNOSIS — S0990XA Unspecified injury of head, initial encounter: Secondary | ICD-10-CM | POA: Insufficient documentation

## 2021-09-14 DIAGNOSIS — J9811 Atelectasis: Secondary | ICD-10-CM | POA: Diagnosis not present

## 2021-09-14 DIAGNOSIS — M2578 Osteophyte, vertebrae: Secondary | ICD-10-CM | POA: Diagnosis not present

## 2021-09-14 DIAGNOSIS — M75102 Unspecified rotator cuff tear or rupture of left shoulder, not specified as traumatic: Secondary | ICD-10-CM

## 2021-09-14 DIAGNOSIS — S46012A Strain of muscle(s) and tendon(s) of the rotator cuff of left shoulder, initial encounter: Secondary | ICD-10-CM | POA: Diagnosis not present

## 2021-09-14 DIAGNOSIS — Z7982 Long term (current) use of aspirin: Secondary | ICD-10-CM | POA: Insufficient documentation

## 2021-09-14 DIAGNOSIS — R0789 Other chest pain: Secondary | ICD-10-CM | POA: Insufficient documentation

## 2021-09-14 DIAGNOSIS — I10 Essential (primary) hypertension: Secondary | ICD-10-CM | POA: Insufficient documentation

## 2021-09-14 DIAGNOSIS — S52515A Nondisplaced fracture of left radial styloid process, initial encounter for closed fracture: Secondary | ICD-10-CM | POA: Diagnosis not present

## 2021-09-14 DIAGNOSIS — S6992XA Unspecified injury of left wrist, hand and finger(s), initial encounter: Secondary | ICD-10-CM | POA: Diagnosis present

## 2021-09-14 DIAGNOSIS — M419 Scoliosis, unspecified: Secondary | ICD-10-CM | POA: Diagnosis not present

## 2021-09-14 DIAGNOSIS — S62102A Fracture of unspecified carpal bone, left wrist, initial encounter for closed fracture: Secondary | ICD-10-CM

## 2021-09-14 DIAGNOSIS — M25532 Pain in left wrist: Secondary | ICD-10-CM | POA: Diagnosis not present

## 2021-09-14 DIAGNOSIS — I517 Cardiomegaly: Secondary | ICD-10-CM | POA: Diagnosis not present

## 2021-09-14 DIAGNOSIS — R233 Spontaneous ecchymoses: Secondary | ICD-10-CM | POA: Diagnosis not present

## 2021-09-14 MED ORDER — IBUPROFEN 800 MG PO TABS
800.0000 mg | ORAL_TABLET | Freq: Once | ORAL | Status: AC
  Administered 2021-09-14: 800 mg via ORAL
  Filled 2021-09-14: qty 1

## 2021-09-14 NOTE — ED Triage Notes (Addendum)
Pt states she tripped over a curb yesterday and fell.  Seen at [facility] in [place].  Reports increased L rib pain and L wrist/hand pain with bruising this morning.  Pt states unable to use R arm for BP due to history of breast CA and unable to use L arm due to pain.  Pt taken straight to treatment room and will have vitals in room once changed into gown.

## 2021-09-14 NOTE — ED Notes (Signed)
Called ortho tech 

## 2021-09-14 NOTE — ED Provider Notes (Signed)
[facility] EMERGENCY DEPARTMENT Provider Note   CSN: [id number] Arrival date & time: 09/14/21  1001     History  Chief Complaint  Patient presents with   Lytle Michaels    Kathleen [name] is a 81 y.o. female with medical history of right-sided breast cancer, hypertension, MVP.  Patient presents to ED for evaluation after suffering ground-level mechanical fall day prior.  Patient states that she tripped on a curb and landed on the left side of her body.  Patient states that she presented to urgent care in [place], had imaging studies that included shoulder x-ray, humerus x-ray, knee x-ray, hip x-ray on the left side.  The chart was reviewed for this encounter which assisted me in acquiring helpful information. These imaging studies were significant for supposed left-sided rotator cuff tear.  All other imaging studies were unremarkable.  Patient presents to ED today due to worsening of her left shoulder pain.  Patient also complaining of left wrist pain and left chest wall pain that she states was not imaged day prior at urgent care.  Patient states that the provider at urgent care "did not even touch me".  Patient reports that she was supposed to be provided with a sling however a sling was never given to her prior to her being discharged from the urgent care.  Patient is also noted to be on blood thinners and she is unsure if she struck her head however she does have ecchymosis beneath her left orbit.  Patient endorsing left wrist pain, left chest wall pain.  Patient denies loss of consciousness, nausea, vomiting, headache, weakness, dizziness, lightheadedness.   Fall Pertinent negatives include no abdominal pain and no headaches.      Home Medications Prior to Admission medications   Medication Sig Start Date End Date Taking? Authorizing Provider  Ascorbic Acid (VITAMIN C) 100 MG tablet Take 100 mg by mouth daily.    [provider]  aspirin EC 81 MG tablet Take 81 mg  by mouth daily.    [provider]  atorvastatin (LIPITOR) 80 MG tablet Take 1 tablet (80 mg total) by mouth daily. 04/03/21   Hali Marry, MD  cholecalciferol (VITAMIN D3) 25 MCG (1000 UNIT) tablet Take 4,000 Units by mouth daily.    [provider]  clindamycin (CLEOCIN) 300 MG capsule Take 300 mg by mouth every 6 (six) hours. 07/27/21   [provider]  Coenzyme Q10 (COQ10 PO) Take by mouth.    [provider]  DHA-EPA-Vit B6-B12-Folic Acid (CARDIOVID PLUS PO) Take by mouth.    [provider]  Ferrous Sulfate (IRON PO) Take by mouth.    [provider]  HYDROcodone-acetaminophen (NORCO) 5-325 MG tablet Take 1 tablet by mouth every 6 (six) hours as needed for moderate pain. 09/13/21   Eliezer Lofts, FNP  ibuprofen (ADVIL) 800 MG tablet TAKE ONE TABLET EVERY 8 HOURS AS NEEDED 12/10/20   Hali Marry, MD  levothyroxine (SYNTHROID) 75 MCG tablet TAKE ONE (1) TABLET BY MOUTH EACH DAY 03/18/21   Hali Marry, MD  metoprolol succinate (TOPROL-XL) 25 MG 24 hr tablet Take 1 tablet (25 mg total) by mouth at bedtime. 04/03/21   Hali Marry, MD  Omega-3 Fatty Acids (OMEGA 3 500 PO) Take by mouth.    [provider]  TURMERIC PO Take by mouth.    [provider]  valACYclovir (VALTREX) 500 MG tablet TAKE ONE (1) TABLET BY MOUTH EACH DAY 04/03/21   Metheney,  Rene Kocher, MD  Zinc Sulfate (ZINC 15 PO) Take by mouth.    [provider]      Allergies    Streptomycin, Amlodipine, Codeine, Elemental sulfur, Penicillins, Sulfa antibiotics, Sulfonamide derivatives, Benicar [olmesartan], Hctz [hydrochlorothiazide], and Lisinopril    Review of Systems   Review of Systems  Gastrointestinal:  Negative for abdominal pain, nausea and vomiting.  Musculoskeletal:  Positive for myalgias. Negative for neck pain and neck stiffness.       Left-sided chest wall pain, left wrist pain  Neurological:  Negative  for dizziness, syncope, weakness, light-headedness and headaches.   Physical Exam Updated Vital Signs BP 131/66    Pulse 66    Temp 98.7 F (37.1 C) (Oral)    Resp 16    SpO2 99%  Physical Exam Vitals and nursing note reviewed.  Constitutional:      General: She is not in acute distress.    Appearance: She is not ill-appearing, toxic-appearing or diaphoretic.  HENT:     Head: Normocephalic.     Comments: Ecchymosis noted underneath left orbit    Nose: Nose normal.     Mouth/Throat:     Mouth: Mucous membranes are moist.  Eyes:     Extraocular Movements: Extraocular movements intact.     Conjunctiva/sclera: Conjunctivae normal.     Pupils: Pupils are equal, round, and reactive to light.  Neck:     Comments: Patient has full active range of motion in neck.  No C-spine step-off, deformity, tenderness Cardiovascular:     Rate and Rhythm: Normal rate and regular rhythm.  Pulmonary:     Effort: Pulmonary effort is normal.     Breath sounds: Normal breath sounds. No wheezing.  Abdominal:     General: Abdomen is flat.     Palpations: Abdomen is soft.     Tenderness: There is no abdominal tenderness.  Musculoskeletal:     Right shoulder: Normal.     Left shoulder: Swelling and tenderness present. Decreased range of motion.     Right wrist: Normal.     Left wrist: Swelling, tenderness and snuff box tenderness present. No deformity, effusion or lacerations. Decreased range of motion.     Cervical back: Normal range of motion and neck supple. No rigidity or tenderness.     Comments: Patient has exquisite tenderness to the left wrist.  She is unable to actively or let me passively range the wrist.  There is ecchymosis overlying the dorsal aspect of the patient's left wrist.  Patient has less than 2-second capillary refill on the left, 2+ radial pulse.  Patient also has exquisite tenderness to her left shoulder.  There is ecchymosis overlying, no deformity noted.  The patient is unable to  range her left shoulder actively.  The patient is able to passively range her left shoulder.  Skin:    General: Skin is warm and dry.     Capillary Refill: Capillary refill takes less than 2 seconds.  Neurological:     Mental Status: She is alert and oriented to person, place, and time.     GCS: GCS eye subscore is 4. GCS verbal subscore is 5. GCS motor subscore is 6.     Cranial Nerves: Cranial nerves 2-12 are intact. No cranial nerve deficit.     Sensory: Sensation is intact. No sensory deficit.     Motor: Motor function is intact. No weakness.     Coordination: Coordination is intact. Heel to [facility] Test normal.  ED Results / Procedures / Treatments   Labs (all labs ordered are listed, but only abnormal results are displayed) Labs Reviewed - No data to display  EKG None  Radiology DG Ribs Unilateral W/Chest Left  Result Date: 09/14/2021 CLINICAL DATA:  Fall. Pain in the LEFT wrist. Pain LATERAL side of the chest. EXAM: LEFT RIBS AND CHEST - 3+ VIEW COMPARISON:  05/17/2021 FINDINGS: Heart is enlarged. Mild prominence of interstitial markings is stable. Surgical clips overlie the RIGHT breast and axilla. There is focal atelectasis at the RIGHT lung base. No pneumothorax. S shaped scoliosis of the thoracolumbar spine. No acute rib fracture identified on oblique views. IMPRESSION: Stable cardiomegaly. Minimal RIGHT LOWER lobe atelectasis. No acute fracture or pneumothorax. Electronically Signed   By: Nolon Nations M.D.   On: 09/14/2021 11:38   DG Wrist Complete Left  Result Date: 09/14/2021 CLINICAL DATA:  Fall a few days ago with persistent left wrist pain. EXAM: LEFT WRIST - COMPLETE 3+ VIEW COMPARISON:  None. FINDINGS: There is a nondisplaced fracture through the base of the radial styloid. No additional carpal bone fractures or dislocation. Osteoarthritis present involving the radiocarpal joint and first [place] joint. IMPRESSION: Nondisplaced fracture of the radial styloid.  Electronically Signed   By: Aletta Edouard M.D.   On: 09/14/2021 11:43   CT Head Wo Contrast  Result Date: 09/14/2021 CLINICAL DATA:  Head trauma, moderate-severe fall on thinners EXAM: CT HEAD WITHOUT CONTRAST TECHNIQUE: Contiguous axial images were obtained from the base of the skull through the vertex without intravenous contrast. RADIATION DOSE REDUCTION: This exam was performed according to the departmental dose-optimization program which includes automated exposure control, adjustment of the mA and/or kV according to patient size and/or use of iterative reconstruction technique. COMPARISON:  2019 FINDINGS: Brain: There is no acute intracranial hemorrhage, mass effect, or edema. Gray-white differentiation is preserved. Chronic appearing small vessel infarcts of the basal ganglia bilaterally. Other patchy hypoattenuation in the supratentorial white matter probably reflects similar chronic microvascular ischemic changes. There is no extra-axial fluid collection. Ventricles and sulci are stable in size and configuration. Vascular: There is mild atherosclerotic calcification at the skull base. Skull: Calvarium is unremarkable. Sinuses/Orbits: No acute finding. Other: None. IMPRESSION: No evidence of acute intracranial injury. Electronically Signed   By: Macy Mis M.D.   On: 09/14/2021 12:06   CT Cervical Spine Wo Contrast  Result Date: 09/14/2021 CLINICAL DATA:  Neck trauma (Age >= 65y) EXAM: CT CERVICAL SPINE WITHOUT CONTRAST TECHNIQUE: Multidetector CT imaging of the cervical spine was performed without intravenous contrast. Multiplanar CT image reconstructions were also generated. RADIATION DOSE REDUCTION: This exam was performed according to the departmental dose-optimization program which includes automated exposure control, adjustment of the mA and/or kV according to patient size and/or use of iterative reconstruction technique. COMPARISON:  None. FINDINGS: Alignment: Mild degenerative  listhesis. Skull base and vertebrae: No acute fracture. Degenerative endplate irregularity. Soft tissues and spinal canal: No prevertebral fluid or swelling. No visible canal hematoma. Disc levels: Multilevel degenerative changes are present including disc space narrowing, endplate osteophytes, and facet and uncovertebral hypertrophy. Other: Unremarkable. IMPRESSION: No acute cervical spine fracture. Electronically Signed   By: Macy Mis M.D.   On: 09/14/2021 12:10   DG Shoulder Left  Result Date: 09/13/2021 CLINICAL DATA:  Fall.  Left humerus injury. EXAM: LEFT SHOULDER - 2+ VIEW; LEFT HUMERUS - 2+ VIEW COMPARISON:  None. FINDINGS: Left shoulder: Moderate acromioclavicular joint space narrowing and peripheral osteophytosis. The humeral head is high-riding. Moderate  to severe glenohumeral joint space narrowing with moderate peripheral degenerative osteophytosis. Within the limitations of diffuse decreased bone mineralization no definite acute fracture is seen. Left humerus: No acute fracture is identified within the more distal aspect of the left humerus. Mild dextrocurvature of the mid to upper thoracic spine with eccentric left disc space narrowing. IMPRESSION:: IMPRESSION: 1. Moderate acromioclavicular and glenohumeral osteoarthritis. 2. High-riding humeral head suspicious for full-thickness superior rotator cuff tear. 3. Within the limitations of diffuse decreased bone mineralization no definite acute fracture is seen. Electronically Signed   By: Yvonne Kendall M.D.   On: 09/13/2021 15:54   DG Knee Complete 4 Views Left  Result Date: 09/13/2021 CLINICAL DATA:  Left knee and hip pain after fall. EXAM: LEFT KNEE - COMPLETE 4+ VIEW COMPARISON:  None. FINDINGS: Mild medial compartment joint space narrowing. Minimal peripheral medial compartment degenerative osteophytosis. No joint effusion. No acute fracture or dislocation. IMPRESSION: Mild medial compartment osteoarthritis.  No acute fracture.  Electronically Signed   By: Yvonne Kendall M.D.   On: 09/13/2021 15:55   DG Humerus Left  Result Date: 09/13/2021 CLINICAL DATA:  Fall.  Left humerus injury. EXAM: LEFT SHOULDER - 2+ VIEW; LEFT HUMERUS - 2+ VIEW COMPARISON:  None. FINDINGS: Left shoulder: Moderate acromioclavicular joint space narrowing and peripheral osteophytosis. The humeral head is high-riding. Moderate to severe glenohumeral joint space narrowing with moderate peripheral degenerative osteophytosis. Within the limitations of diffuse decreased bone mineralization no definite acute fracture is seen. Left humerus: No acute fracture is identified within the more distal aspect of the left humerus. Mild dextrocurvature of the mid to upper thoracic spine with eccentric left disc space narrowing. IMPRESSION:: IMPRESSION: 1. Moderate acromioclavicular and glenohumeral osteoarthritis. 2. High-riding humeral head suspicious for full-thickness superior rotator cuff tear. 3. Within the limitations of diffuse decreased bone mineralization no definite acute fracture is seen. Electronically Signed   By: Yvonne Kendall M.D.   On: 09/13/2021 15:54   DG Femur Min 2 Views Left  Result Date: 09/13/2021 CLINICAL DATA:  Pain after fall EXAM: LEFT FEMUR 2 VIEWS COMPARISON:  Pelvis and left hip radiographs 07/08/2017 FINDINGS: Mild left femoroacetabular joint space narrowing. Moderate heterotopic bone formation of the superior aspect of the left greater trochanter unchanged. Mild medial compartment of the knee joint space narrowing. No acute fracture is seen. No dislocation. IMPRESSION: Mild left femoroacetabular osteoarthritis. No acute fracture is seen within the left femur. Electronically Signed   By: Yvonne Kendall M.D.   On: 09/13/2021 15:58    Procedures Procedures    Medications Ordered in ED Medications  ibuprofen (ADVIL) tablet 800 mg (800 mg Oral Given 09/14/21 1049)    ED Course/ Medical Decision Making/ A&P                           Medical  Decision Making Amount and/or Complexity of Data Reviewed Radiology: ordered.  Risk Prescription drug management.   81 year old female presents to ED for evaluation of left wrist and chest wall pain after being involved in ground-level mechanical fall day prior.  Patient is currently anticoagulated due to stroke. Patient seen at [facility] urgent care day prior, had imaging studies concerning for rotator cuff tear.  Patient here today complaining of left wrist pain along with chest wall pain.  Patient denies being imaged prior to these injuries.  On exam, the patient has tenderness diffusely to her left wrist.  She is unable to range the left wrist actively  or passively.  There is ecchymosis and swelling over the left wrist.  Patient has 2+ radial pulse, she is neurovascularly intact in her left hand.  She has less than 2-second capillary refill.  Patient worked up utilizing following imaging studies interpreted by me: CT head, CT C-spine, left rib x-ray, left wrist x-ray CT head shows no herniation, mass effect, midline shift or intracranial abnormality/hemorrhaging CT C-spine shows no fracture Left rib x-ray shows mild right lower lobe atelectasis.  No abnormalities noted on left Patient left wrist x-ray shows radial styloid fracture, nondisplaced.  Patient placed in thumb spica splint on left, provided with shoulder sling for left shoulder.  Patient also provided with incentive spirometer and coached on splinting techniques as well as deep breathing exercises.  The patient states that she has follow-up in 2 days with her orthopedist for an unrelated complaint.   At this time, the patient is stable for discharge.  Patient provided with return precautions and she voiced understanding.  Patient had all of her questions answered to her satisfaction.  The patient has been encouraged to follow-up with orthopedist on Tuesday.  The patient was placed in a thumb spica splint as well as a left  shoulder sling.  The patient was offered pain medication, she deferred this as she states that she already has some at home.  I have advised the patient to ice her wrist initially and then provide heat for it.  I have instructed her to use NSAIDs therapy to reduce swelling inflammation.  Patient also provided with incentive spirometer and coached on deep breathing exercises.  The patient is stable for discharge at this time.   Final Clinical Impression(s) / ED Diagnoses Final diagnoses:  Closed fracture of left wrist, initial encounter  Tear of left rotator cuff, unspecified tear extent, unspecified whether traumatic    Rx / DC Orders ED Discharge Orders     None         Lawana Chambers 09/14/21 1305    Luna Fuse, MD 09/15/21 [phone]

## 2021-09-14 NOTE — Discharge Instructions (Addendum)
Please follow-up with your orthopedist on Tuesday as we discussed Please return to the ED with any new or worsening signs or symptoms such as decreased pulse/feeling in your left hand Please continue to treat your pain utilizing ibuprofen.  600 800 mg every 6 hours.  You may also treat your pain with the pain medication that you received at urgent care yesterday, please only take this medication if you are not planning on driving or operating heavy machinery, if you are planning on going to bed soon Please read the attached informational packets I have included that provide background information on your injury Please use the incentive spirometer you have been provided.  Please use this 10 times an hour.

## 2021-09-14 NOTE — ED Notes (Signed)
Pt transported to imaging.

## 2021-09-14 NOTE — Progress Notes (Signed)
Orthopedic Tech Progress Note Patient Details:  Sammi Stolarz 1941-02-01 [id number]  Ortho Devices Type of Ortho Device: Thumb spica splint, Arm sling Splint Material: Fiberglass Ortho Device/Splint Location: Left thumb Ortho Device/Splint Interventions: Application   Post Interventions Patient Tolerated: Well  Linus Salmons Nikaela Coyne 09/14/2021, 12:57 PM

## 2021-09-17 ENCOUNTER — Encounter: Payer: Self-pay | Admitting: Orthopaedic Surgery

## 2021-09-17 ENCOUNTER — Ambulatory Visit: Payer: Medicare HMO | Admitting: Orthopaedic Surgery

## 2021-09-17 ENCOUNTER — Other Ambulatory Visit: Payer: Self-pay

## 2021-09-17 DIAGNOSIS — S52513A Displaced fracture of unspecified radial styloid process, initial encounter for closed fracture: Secondary | ICD-10-CM

## 2021-09-17 DIAGNOSIS — S40012A Contusion of left shoulder, initial encounter: Secondary | ICD-10-CM | POA: Insufficient documentation

## 2021-09-17 DIAGNOSIS — S52515A Nondisplaced fracture of left radial styloid process, initial encounter for closed fracture: Secondary | ICD-10-CM

## 2021-09-17 HISTORY — DX: Displaced fracture of unspecified radial styloid process, initial encounter for closed fracture: S52.513A

## 2021-09-17 NOTE — Progress Notes (Signed)
Office Visit Note   Patient: Kathleen [name]           Date of Birth: December 24, 1940           MRN: [id number] Visit Date: 09/17/2021              Requested by: Hali Marry, [address] [place],  [place] [id number] PCP: Hali Marry, MD   Assessment & Plan: Visit Diagnoses:  1. Closed nondisplaced fracture of styloid process of left radius, initial encounter   2. Contusion of left shoulder, initial encounter            With possible RC tear acute, from fall  Plan: NOT GLOBAL.  Return 1 week for wrist splint removal and application of a radial thumb gutter splint with Velcro.  We discussed 6 weeks usual healing time.  She may require diagnostic imaging of her shoulder we will see how she does with this.  Work slip given no work x6 weeks.  X-rays were reviewed shoulder neck CT, wrist radiographs left.  Rib films femur left.  Left knee.  Pathophysiology discussed rationale for treatment discussed.  Nonoperative treatment course  Follow-Up Instructions: Return in about 1 week (around 09/24/2021), or if symptoms worsen or fail to improve.   Orders:  No orders of the defined types were placed in this encounter.  No orders of the defined types were placed in this encounter.     Procedures: No procedures performed   Clinical Data: No additional findings.   Subjective: Chief Complaint  Patient presents with   Left Shoulder - Pain    Fall 09/12/2021   Left Wrist - Pain, Fracture    Fall 09/12/2021    HPI 81 year old female was seen last week for back and unfortunately had a fall on 09/12/2021 with a left radial styloid fracture and left shoulder injury.  Patient's had pain with attempted abduction.  X-ray showed possibly 2 mm high riding head.  She has hydrocodone and ibuprofen is in a sling and then a thumb spica splint for radial styloid fracture.  She has some swelling in her fingertips.  And history of breast cancer on the right with lymph node  removed.  She was given 30 hydrocodone from the ER.  Review of Systems still has plenty of pain medication.  Updated unchanged from last week.   Objective: Vital Signs: Ht 5' (1.524 m)    Wt 110 lb (49.9 kg)    BMI 21.48 kg/m   Physical Exam Constitutional:      Appearance: She is well-developed.  HENT:     Head: Normocephalic.     Right Ear: External ear normal.     Left Ear: External ear normal. There is no impacted cerumen.  Eyes:     Pupils: Pupils are equal, round, and reactive to light.  Neck:     Thyroid: No thyromegaly.     Trachea: No tracheal deviation.  Cardiovascular:     Rate and Rhythm: Normal rate.  Pulmonary:     Effort: Pulmonary effort is normal.  Abdominal:     Palpations: Abdomen is soft.  Musculoskeletal:     Cervical back: No rigidity.  Skin:    General: Skin is warm and dry.  Neurological:     Mental Status: She is alert and oriented to person, place, and time.  Psychiatric:        Behavior: Behavior normal.    Ortho Exam mild swelling/previous fingerbreadth touching  fingertip to distal palmar crease due to digit swelling.  Thumb spica splint well padded.  Tenderness around the shoulder.  Lateral sensation of the deltoid is intact.  Specialty Comments:  No specialty comments available.  Imaging: No results found. Narrative & Impression  CLINICAL DATA:  Fall a few days ago with persistent left wrist pain.   EXAM: LEFT WRIST - COMPLETE 3+ VIEW   COMPARISON:  None.   FINDINGS: There is a nondisplaced fracture through the base of the radial styloid. No additional carpal bone fractures or dislocation. Osteoarthritis present involving the radiocarpal joint and first [place] joint.   IMPRESSION: Nondisplaced fracture of the radial styloid.     Electronically Signed   By: Aletta Edouard M.D.   On: 09/14/2021 11:43     PMFS History: Patient Active Problem List   Diagnosis Date Noted   Radial styloid fracture 09/17/2021   Contusion of  left shoulder 09/17/2021   Lumbar foraminal stenosis 09/12/2021   Weight loss 07/30/2021   Restrictive lung disease 05/15/2021   S/P stroke due to cerebrovascular disease 09/17/2018   Primary osteoarthritis of left hip 07/08/2017   Primary osteoarthritis of both knees 02/25/2017   Lumbar spondylosis 12/03/2016   Internal derangement of left knee 10/09/2016   History of breast cancer 11/09/2012   Benign hematuria 07/05/2010   POSTMENOPAUSAL STATUS 03/29/2010   IMPAIRED FASTING GLUCOSE 12/11/2008   LEG PAIN, BILATERAL 01/20/2008   HIP PAIN, RIGHT 01/05/2008   Disorder of bone and cartilage 02/26/2007   Hypothyroidism 04/28/2006   Essential hypertension 04/28/2006   MITRAL VALVE DISORDER 04/28/2006   Past Medical History:  Diagnosis Date   Cancer ([place])    Hx RT breast infiltrated ductal CA previously on tamoxifen and armidex    Herpes simplex    lt eye   Hypertension    Kidney stones    history   MVP (mitral valve prolapse)    asymptomatic   Personal history of chemotherapy    Personal history of radiation therapy     Family History  Problem Relation Age of Onset   Breast cancer Mother     Past Surgical History:  Procedure Laterality Date   ABDOMINAL HYSTERECTOMY     partial   BREAST BIOPSY     BREAST LUMPECTOMY Right 2000   RT   CERVICAL LAMINECTOMY  1986   SHOULDER SURGERY     rotator cuff repair   throidectomy  1988   For cancer    Social History   Occupational History   Occupation: Working full-time at [employer].  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

## 2021-09-19 ENCOUNTER — Ambulatory Visit (INDEPENDENT_AMBULATORY_CARE_PROVIDER_SITE_OTHER): Payer: Medicare HMO | Admitting: Family Medicine

## 2021-09-19 ENCOUNTER — Encounter: Payer: Self-pay | Admitting: Family Medicine

## 2021-09-19 ENCOUNTER — Ambulatory Visit (INDEPENDENT_AMBULATORY_CARE_PROVIDER_SITE_OTHER): Payer: Medicare HMO

## 2021-09-19 ENCOUNTER — Other Ambulatory Visit: Payer: Self-pay

## 2021-09-19 VITALS — BP 152/83 | HR 67 | Resp 18 | Ht 60.0 in | Wt 110.0 lb

## 2021-09-19 DIAGNOSIS — I517 Cardiomegaly: Secondary | ICD-10-CM | POA: Diagnosis not present

## 2021-09-19 DIAGNOSIS — R0781 Pleurodynia: Secondary | ICD-10-CM

## 2021-09-19 DIAGNOSIS — M19041 Primary osteoarthritis, right hand: Secondary | ICD-10-CM | POA: Diagnosis not present

## 2021-09-19 DIAGNOSIS — M7989 Other specified soft tissue disorders: Secondary | ICD-10-CM

## 2021-09-19 DIAGNOSIS — M79644 Pain in right finger(s): Secondary | ICD-10-CM

## 2021-09-19 DIAGNOSIS — S6991XA Unspecified injury of right wrist, hand and finger(s), initial encounter: Secondary | ICD-10-CM | POA: Diagnosis not present

## 2021-09-19 NOTE — Progress Notes (Signed)
Established Patient Office Visit  Subjective:  Patient ID: Kathleen [name], female    DOB: April 26, 1941  Age: 81 y.o. MRN: [id number]  CC:  Chief Complaint  Patient presents with   ER Follow up     For fall, broken left arm. Patient would like to discuss pain in abdomen when breathing in. Patient would like to discuss x rays.     HPI Kathleen [name] presents for left rib pain.  She does have a radial stylus fracture on her left wrist.  She did see Ortho and they have done a cast for it.  She is to follow-up in about 2 weeks to have that redone.  They said it would likely be 4 to 6 weeks for it to heal.  She is getting a lot of swelling and bruising in her hand.  But she has not really been elevating it.  She says she came in today because she has been having some left rib pain.  They did do an x-ray and there was no sign of a fracture.  But she says that when she woke up this morning the pain was actually worse.  She was given an inspirometer and was told to use it to help from getting PNA.  No fevers or chills etc.  Past Medical History:  Diagnosis Date   Cancer ([place])    Hx RT breast infiltrated ductal CA previously on tamoxifen and armidex    Herpes simplex    lt eye   Hypertension    Kidney stones    history   MVP (mitral valve prolapse)    asymptomatic   Personal history of chemotherapy    Personal history of radiation therapy     Past Surgical History:  Procedure Laterality Date   ABDOMINAL HYSTERECTOMY     partial   BREAST BIOPSY     BREAST LUMPECTOMY Right 2000   RT   CERVICAL LAMINECTOMY  1986   SHOULDER SURGERY     rotator cuff repair   throidectomy  1988   For cancer     Family History  Problem Relation Age of Onset   Breast cancer Mother     Social History   Socioeconomic History   Marital status: Married    Spouse name: Patrick Jupiter   Number of children: 1   Years of education: 12   Highest education level: 12th grade  Occupational History   Occupation:  Working full-time at [employer].  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  Other Topics Concern   Not on file  Social History Narrative   Lives with her husband. She has one adopted daughter and two grandchildren. She has been working a lot but she enjoys Psychiatric nurse.    Social Determinants of Health   Financial Resource Strain: Low Risk    Difficulty of Paying Living Expenses: Not hard at all  Food Insecurity: No Food Insecurity   Worried About Charity fundraiser in the Last Year: Never true   [place] in the Last Year: Never true  Transportation Needs: No Transportation Needs   Lack of Transportation (Medical): No   Lack of Transportation (Non-Medical): No  Physical Activity:  Inactive   Days of Exercise per Week: 0 days   Minutes of Exercise per Session: 0 min  Stress: No Stress Concern Present   Feeling of Stress : Only a little  Social Connections: Moderately Isolated   Frequency of Communication with Friends and Family: More than three times a week   Frequency of Social Gatherings with Friends and Family: Three times a week   Attends Religious Services: Never   Active Member of Clubs or Organizations: No   Attends Archivist Meetings: Never   Marital Status: Married  Human resources officer Violence: Not At Risk   Fear of Current or Ex-Partner: No   Emotionally Abused: No   Physically Abused: No   Sexually Abused: No    Outpatient Medications Prior to Visit  Medication Sig Dispense Refill   Ascorbic Acid (VITAMIN C) 100 MG tablet Take 100 mg by mouth daily.     aspirin EC 81 MG tablet Take 81 mg by mouth daily.     atorvastatin (LIPITOR) 80 MG tablet Take 1 tablet (80 mg total) by mouth  daily. 90 tablet 3   cholecalciferol (VITAMIN D3) 25 MCG (1000 UNIT) tablet Take 4,000 Units by mouth daily.     clindamycin (CLEOCIN) 300 MG capsule Take 300 mg by mouth every 6 (six) hours.     Coenzyme Q10 (COQ10 PO) Take by mouth.     DHA-EPA-Vit B6-B12-Folic Acid (CARDIOVID PLUS PO) Take by mouth.     Ferrous Sulfate (IRON PO) Take by mouth.     HYDROcodone-acetaminophen (NORCO) 5-325 MG tablet Take 1 tablet by mouth every 6 (six) hours as needed for moderate pain. 30 tablet 0   ibuprofen (ADVIL) 800 MG tablet TAKE ONE TABLET EVERY 8 HOURS AS NEEDED 60 tablet 1   levothyroxine (SYNTHROID) 75 MCG tablet TAKE ONE (1) TABLET BY MOUTH EACH DAY 90 tablet 2   metoprolol succinate (TOPROL-XL) 25 MG 24 hr tablet Take 1 tablet (25 mg total) by mouth at bedtime. 90 tablet 1   Omega-3 Fatty Acids (OMEGA 3 500 PO) Take by mouth.     TURMERIC PO Take by mouth.     valACYclovir (VALTREX) 500 MG tablet TAKE ONE (1) TABLET BY MOUTH EACH DAY 30 tablet 6   Zinc Sulfate (ZINC 15 PO) Take by mouth.     No facility-administered medications prior to visit.    Allergies  Allergen Reactions   Streptomycin Other (See Comments)    Passed out   Amlodipine Other (See Comments)    Dizziness on $RemoveBefo'5mg'gKevzOrbjpZ$     Codeine     Other reaction(s): Other (See Comments)   Elemental Sulfur     Other reaction(s): Other (See Comments) Pt not sure   Penicillins    Sulfa Antibiotics    Sulfonamide Derivatives    Benicar [Olmesartan] Other (See Comments)    Diizzy   Hctz [Hydrochlorothiazide] Other (See Comments)    Vertigo, dizziness.    Lisinopril Other (See Comments)    lightheaded    ROS Review of Systems    Objective:    Physical Exam Vitals reviewed.  Constitutional:      Appearance: She is well-developed.  HENT:     Head: Normocephalic and atraumatic.  Eyes:     Conjunctiva/sclera: Conjunctivae normal.  Cardiovascular:     Rate and Rhythm: Normal rate.  Pulmonary:     Effort: Pulmonary effort is  normal.  Musculoskeletal:     Comments: Tender over the left anterior ribs but no popping  or clicking.  No bruising over the skin.  She does have some significant swelling over the PIP joint on the middle finger of the right hand.  She says its been there for quite some time.  He also has significant swelling and bruising over the dorsum of the left hand I did check the bandage and its not too tight she really just needs to work on trying to elevate that hand at home for at least 20 to 30 minutes 3 times a day and then at night as well.  Skin:    General: Skin is dry.  Neurological:     Mental Status: She is alert and oriented to person, place, and time.  Psychiatric:        Behavior: Behavior normal.    BP (!) 152/83 (BP Location: Right Arm)    Pulse 67    Resp 18    Ht 5' (1.524 m)    Wt 110 lb (49.9 kg)    SpO2 99%    BMI 21.48 kg/m  Wt Readings from Last 3 Encounters:  09/19/21 110 lb (49.9 kg)  09/17/21 110 lb (49.9 kg)  09/13/21 110 lb (49.9 kg)     Health Maintenance Due  Topic Date Due   Zoster Vaccines- Shingrix (1 of 2) Never done   COVID-19 Vaccine (4 - Booster for Pfizer series) 09/04/2020   MAMMOGRAM  08/23/2021    There are no preventive care reminders to display for this patient.  Lab Results  Component Value Date   TSH 2.24 07/30/2021   Lab Results  Component Value Date   WBC 7.5 04/03/2021   HGB 13.9 04/03/2021   HCT 41.5 04/03/2021   MCV 91.2 04/03/2021   PLT 225 04/03/2021   Lab Results  Component Value Date   NA 140 04/03/2021   K 4.1 04/03/2021   CO2 30 04/03/2021   GLUCOSE 101 (H) 04/03/2021   BUN 22 04/03/2021   CREATININE 0.85 04/03/2021   BILITOT 0.8 04/03/2021   ALKPHOS 58 02/23/2017   AST 35 04/03/2021   ALT 23 07/03/2021   PROT 6.8 04/03/2021   ALBUMIN 4.0 02/23/2017   CALCIUM 9.6 04/03/2021   EGFR 69 04/03/2021   Lab Results  Component Value Date   CHOL 141 04/03/2021   Lab Results  Component Value Date   HDL 60 04/03/2021    Lab Results  Component Value Date   LDLCALC 68 04/03/2021   Lab Results  Component Value Date   TRIG 46 04/03/2021   Lab Results  Component Value Date   CHOLHDL 2.4 04/03/2021   Lab Results  Component Value Date   HGBA1C 6.0 (H) 04/03/2021      Assessment & Plan:   Problem List Items Addressed This Visit   None Visit Diagnoses     Rib pain on left side    -  Primary   Relevant Orders   DG Ribs Unilateral W/Chest Left   Swelling of right middle finger       Relevant Orders   DG Finger Middle Right      Rib pain on the left side.  We will get repeat x-ray today just to rule out fracture since she has had an increase in pain suspect bone contusion.  We will call the results once available.  She can put a little gentle pressure on that area if needed for coughing sneezing etc.  Continue with inspirometer.  Swelling of right middle finger we will get a plain  film as well suspect chronic synovitis from osteoarthritis versus rheumatoid arthritis.  We will call with results once available.  She has significant swelling in her left hand does not look like the bandages too tight but she has not been keeping it elevated so we had a discussion about some different ways to do that while she is sitting in reclining or in bed or just getting that elbow up.  Unfortunately she has damaged that left rotator cuff so it is made it very difficult for her to actually lift that left arm plus with the heaviness of the cast.  No orders of the defined types were placed in this encounter.   Follow-up: No follow-ups on file.    Beatrice Lecher, MD

## 2021-09-23 NOTE — Progress Notes (Signed)
Call patient: She has moderate arthritis in the MCP joint on that middle finger as well as the DIP joint which is the one closest to the fingernail.  Most of her swelling is actually over the PIP but the joint space actually looks pretty good there so I still think it is a lot of synovitis which is where the lining of the joint actually makes extra fluid to cause swelling.  But the joint itself actually does not look too bad right there.  It is just some mild arthritis at that specific location.

## 2021-09-23 NOTE — Progress Notes (Signed)
Call patient: X-ray of ribs show no side of acute rib fractures so I really do think it is just a bone contusion or bone bruise.  You can apply just a little bit of weight to the rib if you need to cough or sneeze.

## 2021-09-30 ENCOUNTER — Telehealth: Payer: Self-pay | Admitting: Orthopaedic Surgery

## 2021-09-30 NOTE — Telephone Encounter (Signed)
Pt called requesting a return call from [place]. Pt states she has paperwork for [employer]. Please call pt at [phone] ?

## 2021-10-01 ENCOUNTER — Other Ambulatory Visit: Payer: Self-pay

## 2021-10-01 ENCOUNTER — Ambulatory Visit: Payer: Medicare HMO | Admitting: Orthopaedic Surgery

## 2021-10-01 DIAGNOSIS — S52515D Nondisplaced fracture of left radial styloid process, subsequent encounter for closed fracture with routine healing: Secondary | ICD-10-CM

## 2021-10-01 NOTE — Progress Notes (Signed)
? ?Office Visit Note ?  ?Patient: Kathleen [name]           ?Date of Birth: [birth year]           ?MRN: [id number] ?Visit Date: 10/01/2021 ?             ?Requested by: Hali Marry, MD ?[address] ?[address] ?[place],  [place] [address] ?PCP: Hali Marry, MD ? ? ?Assessment & Plan: ?Visit Diagnoses:  ?1. Closed nondisplaced fracture of styloid process of left radius with routine healing, subsequent encounter   ?2.      Fall with shoulder contusion.  Chest x-ray showed some high riding of the humeral head. ? ?Plan: Thumb radial gutter splint applied for her left nondisplaced radial styloid fracture.  On return visit we will obtain three-view x-rays of her left shoulder since she has had pain with inability to abduct and chest x-ray at the time of her fall which showed no pneumothorax but did show some high riding of the humeral head on the left.  She had previous rotator cuff repair by Dr. Suszanne Conners on the right shoulder in the past years ago.  Will obtain left shoulder x-rays and left wrist x-rays on return visit.  Work slip given no work x5 weeks due to wrist fracture.  Recheck 4 weeks. ? ?Follow-Up Instructions: No follow-ups on file.  ? ?Orders:  ?No orders of the defined types were placed in this encounter. ? ?No orders of the defined types were placed in this encounter. ? ? ? ? Procedures: ?No procedures performed ? ? ?Clinical Data: ?No additional findings. ? ? ?Subjective: ?Chief Complaint  ?Patient presents with  ? Left Wrist - Follow-up  ? ? ?HPI 81 year old female follow-up fall with nondisplaced radial styloid fracture.  Splint is removed radial gutter splint applied.  She could take it off to gently wash her hand.  Recheck 4 weeks repeat x-rays left wrist on return. ? ?Review of Systems updated unchanged. ? ? ?Objective: ?Vital Signs: There were no vitals taken for this visit. ? ?Physical Exam ?Constitutional:   ?   Appearance: She is well-developed.  ?HENT:  ?   Head: Normocephalic.  ?    Right Ear: External ear normal.  ?   Left Ear: External ear normal. There is no impacted cerumen.  ?Eyes:  ?   Pupils: Pupils are equal, round, and reactive to light.  ?Neck:  ?   Thyroid: No thyromegaly.  ?   Trachea: No tracheal deviation.  ?Cardiovascular:  ?   Rate and Rhythm: Normal rate.  ?Pulmonary:  ?   Effort: Pulmonary effort is normal.  ?Abdominal:  ?   Palpations: Abdomen is soft.  ?Musculoskeletal:  ?   Cervical back: No rigidity.  ?Skin: ?   General: Skin is warm and dry.  ?Neurological:  ?   Mental Status: She is alert and oriented to person, place, and time.  ?Psychiatric:     ?   Behavior: Behavior normal.  ? ? ?Ortho Exam patient has bruising over the volar aspect some over the thenar area and tenderness of the radial styloid.  She has mild swelling of her fingers and lacks several centimeters reaching fingertips to distal palmar crease. ? ?Specialty Comments:  ?No specialty comments available. ? ?Imaging: ?No results found. ? ? ?PMFS History: ?Patient Active Problem List  ? Diagnosis Date Noted  ? Radial styloid fracture 09/17/2021  ? Contusion of left shoulder 09/17/2021  ? Lumbar foraminal stenosis 09/12/2021  ?  Weight loss 07/30/2021  ? Restrictive lung disease 05/15/2021  ? S/P stroke due to cerebrovascular disease 09/17/2018  ? Primary osteoarthritis of left hip 07/08/2017  ? Primary osteoarthritis of both knees 02/25/2017  ? Lumbar spondylosis 12/03/2016  ? Internal derangement of left knee 10/09/2016  ? History of breast cancer 11/09/2012  ? Benign hematuria 07/05/2010  ? POSTMENOPAUSAL STATUS 03/29/2010  ? IMPAIRED FASTING GLUCOSE 12/11/2008  ? LEG PAIN, BILATERAL 01/20/2008  ? HIP PAIN, RIGHT 01/05/2008  ? Disorder of bone and cartilage 02/26/2007  ? Hypothyroidism 04/28/2006  ? Essential hypertension 04/28/2006  ? MITRAL VALVE DISORDER 04/28/2006  ? ?Past Medical History:  ?Diagnosis Date  ? Cancer [facility])   ? Hx RT breast infiltrated ductal CA previously on tamoxifen and armidex   ?  Herpes simplex   ? lt eye  ? Hypertension   ? Kidney stones   ? history  ? MVP (mitral valve prolapse)   ? asymptomatic  ? Personal history of chemotherapy   ? Personal history of radiation therapy   ?  ?Family History  ?Problem Relation Age of Onset  ? Breast cancer Mother   ?  ?Past Surgical History:  ?Procedure Laterality Date  ? ABDOMINAL HYSTERECTOMY    ? partial  ? BREAST BIOPSY    ? BREAST LUMPECTOMY Right 2000  ? RT  ? CERVICAL LAMINECTOMY  1986  ? SHOULDER SURGERY    ? rotator cuff repair  ? throidectomy  1988  ? For cancer   ? ?Social History  ? ?Occupational History  ? Occupation: Working full-time at [employer].  ?Tobacco Use  ? Smoking status: Never  ? Smokeless tobacco: Never  ?Substance and Sexual Activity  ? Alcohol use: No  ? Drug use: Never  ? Sexual activity: Not on file  ?  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  ? ? ? ? ? ? ?

## 2021-10-10 ENCOUNTER — Telehealth: Payer: Self-pay | Admitting: Orthopaedic Surgery

## 2021-10-10 NOTE — Telephone Encounter (Signed)
Duplicate message in chart.  

## 2021-10-10 NOTE — Telephone Encounter (Signed)
Pt came by to speak with Caryl Pina or [place],  ? ?Gwinda Passe is in [place], I suggested that the pt leave the paperwork, and I would insure that [facility] received it and gave a call back to the pt. ? ?Gwinda Passe is in [place] today and pt is aware  ? ? ?

## 2021-10-10 NOTE — Telephone Encounter (Signed)
Pt is here sitting in the waiting room --sent a teams back to [place] ?

## 2021-10-10 NOTE — Telephone Encounter (Signed)
Pt called requesting a call from [facility] about some forms that Dr. Lorin Mercy and Gwinda Passe filled out. Please call pt at [phone]. ?

## 2021-10-11 NOTE — Telephone Encounter (Signed)
I emailed the one page FMLA  paperwork back that had not been completed.  I spoke with patient and advised. ?

## 2021-10-11 NOTE — Telephone Encounter (Signed)
Pt is calling back

## 2021-10-11 NOTE — Telephone Encounter (Signed)
Form completed.

## 2021-10-11 NOTE — Telephone Encounter (Signed)
Pt called and would like to know if form can be emailed to her daughter.  ? ?Email: [email or web address] ? ?[phone] ?

## 2021-10-23 DIAGNOSIS — C44729 Squamous cell carcinoma of skin of left lower limb, including hip: Secondary | ICD-10-CM | POA: Diagnosis not present

## 2021-10-23 DIAGNOSIS — D485 Neoplasm of uncertain behavior of skin: Secondary | ICD-10-CM | POA: Diagnosis not present

## 2021-10-29 ENCOUNTER — Ambulatory Visit: Payer: Medicare HMO | Admitting: Orthopaedic Surgery

## 2021-10-29 ENCOUNTER — Ambulatory Visit: Payer: Medicare HMO

## 2021-10-29 ENCOUNTER — Encounter: Payer: Self-pay | Admitting: Orthopaedic Surgery

## 2021-10-29 VITALS — BP 156/73 | HR 42 | Ht 60.0 in | Wt 110.0 lb

## 2021-10-29 DIAGNOSIS — S52515D Nondisplaced fracture of left radial styloid process, subsequent encounter for closed fracture with routine healing: Secondary | ICD-10-CM | POA: Diagnosis not present

## 2021-10-29 DIAGNOSIS — S40012A Contusion of left shoulder, initial encounter: Secondary | ICD-10-CM

## 2021-10-29 NOTE — Progress Notes (Signed)
? ?Office Visit Note ?  ?Patient: Kathleen [name]           ?Date of Birth: [birth year]           ?MRN: [id number] ?Visit Date: 10/29/2021 ?             ?Requested by: Hali Marry, MD ?[address] ?[address] ?[place],  [place] [address] ?PCP: Hali Marry, MD ? ? ?Assessment & Plan: ?Visit Diagnoses:  ?1. Closed nondisplaced fracture of styloid process of left radius with routine healing, subsequent encounter   ?2. Contusion of left shoulder, initial encounter   ? ? ?Plan: Patient's already had surgery in the past on the right shoulder for rotator cuff repair by Dr.Sypher.  With her fall and left radial styloid fracture she is had her arm immobilized and shoulder is gotten stiffer.  We will set up for some physical therapy work slip given no work x3 weeks recheck 3 weeks.  We can make a determination about work resumption at that point.  I discussed with her she likely has a chronic tear of the rotator cuff of the left shoulder since she has high riding head on previous radiographs of her shoulder.  Prior to fall she had some discomfort with outstretched and overhead activity but is fairly well-tolerated.  She does not make progress with therapy then we will need to proceed with left shoulder MRI imaging.  Recheck 3 weeks. ? ?Follow-Up Instructions: No follow-ups on file.  ? ?Orders:  ?Orders Placed This Encounter  ?Procedures  ? XR Wrist Complete Left  ? XR Shoulder Left  ? ?No orders of the defined types were placed in this encounter. ? ? ? ? Procedures: ?No procedures performed ? ? ?Clinical Data: ?No additional findings. ? ? ?Subjective: ?Chief Complaint  ?Patient presents with  ? Left Wrist - Fracture, Follow-up  ?  Fall 09/12/2021  ? Left Shoulder - Pain  ?  Fall 09/12/2021  ? ? ?HPI follow-up radial styloid fracture and shoulder contusion.  Plain radiographs suggest chronic rotator cuff tear with some high riding of the head on some images.  Fall date was 09/12/2021.  X-rays show styloid  fracture is healed she can discontinue the thumb gutter splint.  We will work on some physical therapy at [facility]: Outpatient therapy clinic.  She can loosen up her left wrist with gentle range of motion use a splint only if she has increased soreness. ? ?Review of Systems updated unchanged. ? ? ?Objective: ?Vital Signs: BP (!) 156/73   Pulse (!) 42   Ht 5' (1.524 m)   Wt 110 lb (49.9 kg)   BMI 21.48 kg/m?  ? ?Physical Exam ?Constitutional:   ?   Appearance: She is well-developed.  ?HENT:  ?   Head: Normocephalic.  ?   Right Ear: External ear normal.  ?   Left Ear: External ear normal. There is no impacted cerumen.  ?Eyes:  ?   Pupils: Pupils are equal, round, and reactive to light.  ?Neck:  ?   Thyroid: No thyromegaly.  ?   Trachea: No tracheal deviation.  ?Cardiovascular:  ?   Rate and Rhythm: Normal rate.  ?Pulmonary:  ?   Effort: Pulmonary effort is normal.  ?Abdominal:  ?   Palpations: Abdomen is soft.  ?Musculoskeletal:  ?   Cervical back: No rigidity.  ?Skin: ?   General: Skin is warm and dry.  ?Neurological:  ?   Mental Status: She is  alert and oriented to person, place, and time.  ?Psychiatric:     ?   Behavior: Behavior normal.  ? ? ?Ortho Exam mild residual edema of the wrist.  No tenderness to fracture site.  Positive drop arm test left shoulder positive impingement.  Passively I can get her to 110 degrees to 120 with some pain. ? ?Specialty Comments:  ?No specialty comments available. ? ?Imaging: ?No results found. ? ? ?PMFS History: ?Patient Active Problem List  ? Diagnosis Date Noted  ? Radial styloid fracture 09/17/2021  ? Contusion of left shoulder 09/17/2021  ? Lumbar foraminal stenosis 09/12/2021  ? Weight loss 07/30/2021  ? Restrictive lung disease 05/15/2021  ? S/P stroke due to cerebrovascular disease 09/17/2018  ? Primary osteoarthritis of left hip 07/08/2017  ? Primary osteoarthritis of both knees 02/25/2017  ? Lumbar spondylosis 12/03/2016  ? Internal derangement of left  knee 10/09/2016  ? History of breast cancer 11/09/2012  ? Benign hematuria 07/05/2010  ? POSTMENOPAUSAL STATUS 03/29/2010  ? IMPAIRED FASTING GLUCOSE 12/11/2008  ? LEG PAIN, BILATERAL 01/20/2008  ? HIP PAIN, RIGHT 01/05/2008  ? Disorder of bone and cartilage 02/26/2007  ? Hypothyroidism 04/28/2006  ? Essential hypertension 04/28/2006  ? MITRAL VALVE DISORDER 04/28/2006  ? ?Past Medical History:  ?Diagnosis Date  ? Cancer [facility])   ? Hx RT breast infiltrated ductal CA previously on tamoxifen and armidex   ? Herpes simplex   ? lt eye  ? Hypertension   ? Kidney stones   ? history  ? MVP (mitral valve prolapse)   ? asymptomatic  ? Personal history of chemotherapy   ? Personal history of radiation therapy   ?  ?Family History  ?Problem Relation Age of Onset  ? Breast cancer Mother   ?  ?Past Surgical History:  ?Procedure Laterality Date  ? ABDOMINAL HYSTERECTOMY    ? partial  ? BREAST BIOPSY    ? BREAST LUMPECTOMY Right 2000  ? RT  ? CERVICAL LAMINECTOMY  1986  ? SHOULDER SURGERY    ? rotator cuff repair  ? throidectomy  1988  ? For cancer   ? ?Social History  ? ?Occupational History  ? Occupation: Working full-time at [employer].  ?Tobacco Use  ? Smoking status: Never  ? Smokeless tobacco: Never  ?Substance and Sexual Activity  ? Alcohol use: No  ? Drug use: Never  ? Sexual activity: Not on file  ?  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  ? ? ? ? ? ? ?

## 2021-10-31 ENCOUNTER — Ambulatory Visit: Payer: Medicare HMO | Attending: Family Medicine | Admitting: Physical Therapy

## 2021-10-31 ENCOUNTER — Other Ambulatory Visit: Payer: Self-pay | Admitting: Family Medicine

## 2021-10-31 DIAGNOSIS — M25632 Stiffness of left wrist, not elsewhere classified: Secondary | ICD-10-CM | POA: Insufficient documentation

## 2021-10-31 DIAGNOSIS — M25612 Stiffness of left shoulder, not elsewhere classified: Secondary | ICD-10-CM | POA: Insufficient documentation

## 2021-10-31 DIAGNOSIS — M6281 Muscle weakness (generalized): Secondary | ICD-10-CM | POA: Insufficient documentation

## 2021-10-31 DIAGNOSIS — M25512 Pain in left shoulder: Secondary | ICD-10-CM | POA: Diagnosis not present

## 2021-10-31 DIAGNOSIS — R29898 Other symptoms and signs involving the musculoskeletal system: Secondary | ICD-10-CM | POA: Diagnosis not present

## 2021-10-31 DIAGNOSIS — R600 Localized edema: Secondary | ICD-10-CM | POA: Insufficient documentation

## 2021-10-31 DIAGNOSIS — S52515D Nondisplaced fracture of left radial styloid process, subsequent encounter for closed fracture with routine healing: Secondary | ICD-10-CM | POA: Diagnosis present

## 2021-10-31 DIAGNOSIS — R293 Abnormal posture: Secondary | ICD-10-CM | POA: Insufficient documentation

## 2021-10-31 DIAGNOSIS — M5441 Lumbago with sciatica, right side: Secondary | ICD-10-CM | POA: Diagnosis not present

## 2021-10-31 DIAGNOSIS — G8929 Other chronic pain: Secondary | ICD-10-CM | POA: Diagnosis present

## 2021-10-31 DIAGNOSIS — S40012A Contusion of left shoulder, initial encounter: Secondary | ICD-10-CM | POA: Insufficient documentation

## 2021-10-31 DIAGNOSIS — I10 Essential (primary) hypertension: Secondary | ICD-10-CM

## 2021-10-31 DIAGNOSIS — M25532 Pain in left wrist: Secondary | ICD-10-CM | POA: Diagnosis not present

## 2021-10-31 NOTE — Therapy (Signed)
[place] [facility] ?[address] ?[place], [place], [address] ?Phone: [phone]   Fax:  [phone] ? ?Physical Therapy Evaluation ? ?Patient Details  ?Name: Kathleen [name] ?MRN: [id number] ?Date of Birth: Jan 16, 1941 ?Referring Provider (PT): Kathleen Killings, MD ? ? ?Encounter Date: 10/31/2021 ? ? PT End of Session - 10/31/21 1253   ? ? Visit Number 1   ? Number of Visits 12   ? Date for PT Re-Evaluation 12/12/21   ? Authorization Type [employer]   ? Progress Note Due on Visit 10   ? PT Start Time 1150   ? PT Stop Time 1230   ? PT Time Calculation (min) 40 min   ? Activity Tolerance Patient tolerated treatment well   ? Behavior During Therapy [facility] for tasks assessed/performed   ? ?  ?  ? ?  ? ? ?Past Medical History:  ?Diagnosis Date  ? Cancer [facility])   ? Hx RT breast infiltrated ductal CA previously on tamoxifen and armidex   ? Herpes simplex   ? lt eye  ? Hypertension   ? Kidney stones   ? history  ? MVP (mitral valve prolapse)   ? asymptomatic  ? Personal history of chemotherapy   ? Personal history of radiation therapy   ? ? ?Past Surgical History:  ?Procedure Laterality Date  ? ABDOMINAL HYSTERECTOMY    ? partial  ? BREAST BIOPSY    ? BREAST LUMPECTOMY Right 2000  ? RT  ? CERVICAL LAMINECTOMY  1986  ? SHOULDER SURGERY    ? rotator cuff repair  ? throidectomy  1988  ? For cancer   ? ? ?There were no vitals filed for this visit. ? ? ? Subjective Assessment - 10/31/21 1150   ? ? Subjective Pt states fall occurred Feb 2023. Pt reports she broke her hand and it's been in a splint for 6 weeks. Pt also states she also tore her L RTC muscles. Pt notes arthritis in her hands as well.   ? Pertinent History stroke 2018; cervical disc sx ~ 30 yrs ago; arthritis; scoliosis; HTN   ? How long can you sit comfortably? n/a   ? How long can you stand comfortably? n/a   ? How long can you walk comfortably? n/a   ? Diagnostic tests anterolisthesis lumbar spine; ankle xray (-)   ?  Patient Stated Goals Get stronger, use left hand and arm with less pain   ? Currently in Pain? Yes   ? Pain Score 6    ? Pain Location Shoulder   ? Pain Orientation Left   ? Pain Type Acute pain   ? Pain Onset More than a month ago   ? Aggravating Factors  Moving shoulder   ? Multiple Pain Sites Yes   ? Pain Location Wrist   ? Pain Orientation Left   ? Pain Type Acute pain   ? Pain Radiating Towards up thumb   ? ?  ?  ? ?  ? ? ? ? ? [facility] PT Assessment - 10/31/21 0001   ? ?  ? Assessment  ? Medical Diagnosis S52.515D (ICD-10-CM) - Closed nondisplaced fracture of styloid process of left radius with routine healing, subsequent encounter  S40.012A (ICD-10-CM) - Contusion of left shoulder, initial encounter  Fall with left shoulder impingement 09/12/2021, left radial styloid fracture healed   ? Referring Provider (PT) Kathleen Killings, MD   ? Hand Dominance Right   ? Prior Therapy Here for  LBP and R ankle   ?  ? Precautions  ? Precautions None   ?  ? Balance Screen  ? Has the patient fallen in the past 6 months Yes   ? How many times? 1   ? Has the patient had a decrease in activity level because of a fear of falling?  Yes   ? Is the patient reluctant to leave their home because of a fear of falling?  Yes   ?  ? Prior Function  ? Vocation Full time employment   ? 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 Has to lift 40#, climb up ladder and lift over head   ? Leisure household chores; cooking; cleaning   ?  ? Observation/Other Assessments  ? Focus on Therapeutic Outcomes (FOTO)  n/a   ?  ? Posture/Postural Control  ? Posture Comments head forward; shoulders rounded; upper body shifted and tipped to Rt; wear pattern on shoe more on the Rt foot   ?  ? AROM  ? AROM Assessment Site Wrist;Thumb;Shoulder   ? Right/Left Shoulder Left   ? Left Shoulder Flexion 45 Degrees   limited by pain  ? Left Shoulder ABduction 60 Degrees   limited by pain  ? Left Shoulder Internal Rotation 95 Degrees   to belly  ? Left  Shoulder External Rotation 50 Degrees   ? Right/Left Wrist Left   ? Left Wrist Extension 10 Degrees   ? Left Wrist Flexion 8 Degrees   ? Left Wrist Radial Deviation 11 Degrees   ? Left Wrist Ulnar Deviation 11 Degrees   ? Right/Left Thumb Left   ? Left Thumb Opposition --   Limited throughout  ?  ? Strength  ? Overall Strength Comments Deferred testing due to pain with AROM of shoulder, wrist and hand   ? ?  ?  ? ?  ? ? ? ? ? ? ? ? ? ? ? ? ? ?Objective measurements completed on examination: See above findings.  ? ? ? ? ? ? ? ? ? ? ? ? ? ? PT Education - 10/31/21 1239   ? ? Education Details Exam findings, POC, HEP   ? Person(s) Educated Patient   ? Methods Explanation;Demonstration;Tactile cues;Verbal cues;Handout   ? Comprehension Verbalized understanding;Returned demonstration;Verbal cues required;Tactile cues required   ? ?  ?  ? ?  ? ? ? ? ? ? PT Long Term Goals - 10/31/21 1254   ? ?  ? PT LONG TERM GOAL #1  ? Title Pt will be ind with HEP and progressing her exercises at home   ? Time 6   ? Period Weeks   ? Status New   ? Target Date 12/12/21   ?  ? PT LONG TERM GOAL #2  ? Title Pt will be able to elevate L shoulder >100 deg for overhead activity   ? Time 6   ? Period Weeks   ? Status New   ? Target Date 12/12/21   ?  ? PT LONG TERM GOAL #3  ? Title Pt will be able to lift and carry at least 40# per her work requirements   ? Time 6   ? Period Weeks   ? Status New   ? Target Date 12/12/21   ?  ? PT LONG TERM GOAL #4  ? Title Pt will demo L = R wrist/hand AROM for improved ability to open jars and bottles   ? Time 6   ?  Period Weeks   ? Status New   ? Target Date 12/12/21   ? ?  ?  ? ?  ? ? ? ? ? ? ? ? ? Plan - 10/31/21 1242   ? ? Clinical Impression Statement [name] is an 81 y/o F presenting to OPPT s/p fall on L UE leading to L radial styloid fracture. Pt notes she may also have a L RTC tear but she will get imaging to confirm this. PMH also indicates arthritis in both hands. Pt has been immobilized in a  left thumb radial gutter splint for the last month or so. Ortho note states that this fracture is now fully healed. On assessment, pt demos learned disuse of L UE with very limited finger and wrist ROM. Pain with thumb extension, flexion/opposition and abduction. Pt with also very limited L shoulder ROM, painful with elevation. Did not attempt to assess MMT this session due to painful movements with just AROM. Pt continues to heavily guard L UE and requires cueing to relax her arms. Initiated hand/wrist/shoulder exercises. Pt would benefit from PT to address these issues for return to PLOF.   ? Personal Factors and Comorbidities Age;Fitness;Time since onset of injury/illness/exacerbation;Profession   ? Examination-Activity Limitations Carry;Lift;Hygiene/Grooming;Toileting;Dressing;Reach Overhead;Bathing   ? Examination-Participation Restrictions Cleaning;Community Activity;Driving;Meal Prep;Yard Work;Laundry   ? Stability/Clinical Decision Making Stable/Uncomplicated   ? Rehab Potential Good   ? PT Frequency 2x / week   ? PT Duration 6 weeks   ? PT Treatment/Interventions ADLs/Self Care Home Management;Aquatic Therapy;Cryotherapy;Electrical Stimulation;Iontophoresis '4mg'$ /ml Dexamethasone;Moist Heat;Ultrasound;Functional mobility training;Therapeutic activities;Therapeutic exercise;Neuromuscular re-education;Patient/family education;Manual techniques;Dry needling;Taping;Passive range of motion;Vasopneumatic Device   ? PT Next Visit Plan Review exercises as needed. Gentle manual/PROM/mobs for wrist, fingers and shoulder. Initiate strengthening if able with wrist and shoulder isometrics. Trial table AAROM for shoulder.   ? PT Home Exercise Plan Access Code [email or web address]   ? Consulted and Agree with Plan of Care Patient   ? ?  ?  ? ?  ? ? ?Patient will benefit from skilled therapeutic intervention in order to improve the following deficits and impairments:  Decreased range of motion, Increased fascial restricitons, Decreased  activity tolerance, Pain, Increased muscle spasms, Hypomobility, Impaired flexibility, Improper body mechanics, Decreased mobility, Decreased strength, Postural dysfunction, Impaired UE functional use, Incr

## 2021-10-31 NOTE — Patient Instructions (Signed)
Access Code: [email or web address] ?URL: [email or web address] ?Date: 10/31/2021 ?Prepared by: Estill Bamberg April Thurnell Garbe ? ?Exercises ?- Shoulder External Rotation and Scapular Retraction  - 1 x daily - 7 x weekly - 2 sets - 10 reps ?- Seated Scapular Retraction  - 1 x daily - 7 x weekly - 2 sets - 10 reps ?- Wrist Flexion Extension AROM with Fingers Curled and Palm Down  - 1 x daily - 7 x weekly - 2 sets - 10 reps ?- Composite Flexion with Putty  - 1 x daily - 7 x weekly - 2 sets - 10 reps ?- Finger Spreading  - 1 x daily - 7 x weekly - 2 sets - 10 reps ?- Plank on Counter  - 1 x daily - 7 x weekly - 1 sets - 10 reps - 5 sec hold ?

## 2021-11-05 ENCOUNTER — Ambulatory Visit: Payer: Medicare HMO | Admitting: Physical Therapy

## 2021-11-05 ENCOUNTER — Encounter: Payer: Self-pay | Admitting: Family Medicine

## 2021-11-05 ENCOUNTER — Ambulatory Visit (INDEPENDENT_AMBULATORY_CARE_PROVIDER_SITE_OTHER): Payer: Medicare HMO | Admitting: Family Medicine

## 2021-11-05 VITALS — BP 157/92 | HR 46 | Resp 16 | Ht 60.0 in | Wt 108.0 lb

## 2021-11-05 DIAGNOSIS — E039 Hypothyroidism, unspecified: Secondary | ICD-10-CM

## 2021-11-05 DIAGNOSIS — E559 Vitamin D deficiency, unspecified: Secondary | ICD-10-CM | POA: Diagnosis not present

## 2021-11-05 DIAGNOSIS — R600 Localized edema: Secondary | ICD-10-CM

## 2021-11-05 DIAGNOSIS — M5441 Lumbago with sciatica, right side: Secondary | ICD-10-CM | POA: Diagnosis not present

## 2021-11-05 DIAGNOSIS — M25612 Stiffness of left shoulder, not elsewhere classified: Secondary | ICD-10-CM

## 2021-11-05 DIAGNOSIS — M899 Disorder of bone, unspecified: Secondary | ICD-10-CM

## 2021-11-05 DIAGNOSIS — M6281 Muscle weakness (generalized): Secondary | ICD-10-CM

## 2021-11-05 DIAGNOSIS — M949 Disorder of cartilage, unspecified: Secondary | ICD-10-CM | POA: Diagnosis not present

## 2021-11-05 DIAGNOSIS — M25632 Stiffness of left wrist, not elsewhere classified: Secondary | ICD-10-CM | POA: Diagnosis not present

## 2021-11-05 DIAGNOSIS — M25532 Pain in left wrist: Secondary | ICD-10-CM

## 2021-11-05 DIAGNOSIS — R300 Dysuria: Secondary | ICD-10-CM | POA: Diagnosis not present

## 2021-11-05 DIAGNOSIS — R293 Abnormal posture: Secondary | ICD-10-CM | POA: Diagnosis not present

## 2021-11-05 DIAGNOSIS — R5383 Other fatigue: Secondary | ICD-10-CM | POA: Diagnosis not present

## 2021-11-05 DIAGNOSIS — M25512 Pain in left shoulder: Secondary | ICD-10-CM | POA: Diagnosis not present

## 2021-11-05 DIAGNOSIS — D509 Iron deficiency anemia, unspecified: Secondary | ICD-10-CM | POA: Diagnosis not present

## 2021-11-05 DIAGNOSIS — R29898 Other symptoms and signs involving the musculoskeletal system: Secondary | ICD-10-CM | POA: Diagnosis not present

## 2021-11-05 DIAGNOSIS — R634 Abnormal weight loss: Secondary | ICD-10-CM

## 2021-11-05 LAB — POCT URINALYSIS DIP (CLINITEK)
Bilirubin, UA: NEGATIVE
Glucose, UA: NEGATIVE mg/dL
Ketones, POC UA: NEGATIVE mg/dL
Leukocytes, UA: NEGATIVE
Nitrite, UA: NEGATIVE
POC PROTEIN,UA: NEGATIVE
Spec Grav, UA: 1.02 (ref 1.010–1.025)
Urobilinogen, UA: 0.2 E.U./dL
pH, UA: 6 (ref 5.0–8.0)

## 2021-11-05 NOTE — Therapy (Signed)
[place] ?[facility] ?[address] ?[place], [place], [address] ?Phone: [phone]   Fax:  [phone] ? ?Physical Therapy Treatment ? ?Patient Details  ?Name: Kathleen [name] ?MRN: [id number] ?Date of Birth: [birth year] ?Referring Provider (PT): Marybelle Killings, MD ? ? ?Encounter Date: 11/05/2021 ? ? PT End of Session - 11/05/21 1153   ? ? Visit Number 2   ? Number of Visits 12   ? Date for PT Re-Evaluation 12/12/21   ? Authorization Type [employer]   ? Authorization - Visit Number 2   ? Progress Note Due on Visit 10   ? PT Start Time 1150   ? PT Stop Time 1235   ? PT Time Calculation (min) 45 min   ? Activity Tolerance Patient tolerated treatment well   ? Behavior During Therapy [facility] for tasks assessed/performed   ? ?  ?  ? ?  ? ? ?Past Medical History:  ?Diagnosis Date  ? Cancer [facility])   ? Hx RT breast infiltrated ductal CA previously on tamoxifen and armidex   ? Herpes simplex   ? lt eye  ? Hypertension   ? Kidney stones   ? history  ? MVP (mitral valve prolapse)   ? asymptomatic  ? Personal history of chemotherapy   ? Personal history of radiation therapy   ? ? ?Past Surgical History:  ?Procedure Laterality Date  ? ABDOMINAL HYSTERECTOMY    ? partial  ? BREAST BIOPSY    ? BREAST LUMPECTOMY Right 2000  ? RT  ? CERVICAL LAMINECTOMY  1986  ? SHOULDER SURGERY    ? rotator cuff repair  ? throidectomy  1988  ? For cancer   ? ? ?There were no vitals filed for this visit. ? ? Subjective Assessment - 11/05/21 1244   ? ? Subjective Pt notes some improvement with her hand. Continued L shoulder pain with movement   ? Pertinent History stroke 2018; cervical disc sx ~ 30 yrs ago; arthritis; scoliosis; HTN   ? How long can you sit comfortably? n/a   ? How long can you stand comfortably? n/a   ? How long can you walk comfortably? n/a   ? Diagnostic tests anterolisthesis lumbar spine; ankle xray (-)   ? Patient Stated Goals Get stronger, use left hand and arm with less pain   ?  Currently in Pain? Yes   ? Pain Score 6    ? Pain Location Shoulder   ? Pain Orientation Left   ? Pain Descriptors / Indicators Sore;Burning   ? Pain Type Acute pain   ? Pain Onset More than a month ago   ? ?  ?  ? ?  ? ? ? ? ? [facility] PT Assessment - 11/05/21 0001   ? ?  ? Assessment  ? Medical Diagnosis S52.515D (ICD-10-CM) - Closed nondisplaced fracture of styloid process of left radius with routine healing, subsequent encounter  S40.012A (ICD-10-CM) - Contusion of left shoulder, initial encounter  Fall with left shoulder impingement 09/12/2021, left radial styloid fracture healed   ? Referring Provider (PT) Marybelle Killings, MD   ? Hand Dominance Right   ? Prior Therapy Here for LBP and R ankle   ? ?  ?  ? ?  ? ? ? ? ? ? ? ? ? ? ? ? ? ? ? ? [place] [facility] PT Treatment/Exercise - 11/05/21 0001   ? ?  ? Hand Exercises  ? Other Hand Exercises Grip and extend  2x10 handmaster   ?  ? Wrist Exercises  ? Wrist Radial Deviation AROM;20 reps;Left   ? Wrist Ulnar Deviation AROM;Left;20 reps   ?  ? Manual Therapy  ? Manual Therapy Joint mobilization   ? Manual therapy comments PROM 1st CMC and MCP flexion/opposition   ? Joint Mobilization L 1st MCP and CMC grade III mob into abduction and ext   ? Soft tissue mobilization STM flexor and abductor pollicis   ? ?  ?  ? ?  ? ? ? ? ? ? ? ? ? ? PT Education - 11/05/21 1244   ? ? Education Details Updates in HEP   ? Person(s) Educated Patient   ? Methods Explanation;Demonstration;Tactile cues;Verbal cues;Handout   ? Comprehension Verbalized understanding;Returned demonstration;Verbal cues required;Tactile cues required   ? ?  ?  ? ?  ? ? ? ? ? ? PT Long Term Goals - 10/31/21 1254   ? ?  ? PT LONG TERM GOAL #1  ? Title Pt will be ind with HEP and progressing her exercises at home   ? Time 6   ? Period Weeks   ? Status New   ? Target Date 12/12/21   ?  ? PT LONG TERM GOAL #2  ? Title Pt will be able to elevate L shoulder >100 deg for overhead activity   ? Time 6   ? Period Weeks   ? Status New    ? Target Date 12/12/21   ?  ? PT LONG TERM GOAL #3  ? Title Pt will be able to lift and carry at least 40# per her work requirements   ? Time 6   ? Period Weeks   ? Status New   ? Target Date 12/12/21   ?  ? PT LONG TERM GOAL #4  ? Title Pt will demo L = R wrist/hand AROM for improved ability to open jars and bottles   ? Time 6   ? Period Weeks   ? Status New   ? Target Date 12/12/21   ? ?  ?  ? ?  ? ? ? ? ? ? ? ? Plan - 11/05/21 1245   ? ? Clinical Impression Statement Treatment session focused on improving hand and thumb movement. Increased abduction/extension and opposition after gentle mobs and manual work. Continued to work on wrist AROM with focus on decreasing her ulnar deviation as this may be increasing her distal radius/scaphoid pain.   ? Personal Factors and Comorbidities Age;Fitness;Time since onset of injury/illness/exacerbation;Profession   ? Examination-Activity Limitations Carry;Lift;Hygiene/Grooming;Toileting;Dressing;Reach Overhead;Bathing   ? Examination-Participation Restrictions Cleaning;Community Activity;Driving;Meal Prep;Yard Work;Laundry   ? Stability/Clinical Decision Making Stable/Uncomplicated   ? Rehab Potential Good   ? PT Frequency 2x / week   ? PT Duration 6 weeks   ? PT Treatment/Interventions ADLs/Self Care Home Management;Aquatic Therapy;Cryotherapy;Electrical Stimulation;Iontophoresis '4mg'$ /ml Dexamethasone;Moist Heat;Ultrasound;Functional mobility training;Therapeutic activities;Therapeutic exercise;Neuromuscular re-education;Patient/family education;Manual techniques;Dry needling;Taping;Passive range of motion;Vasopneumatic Device   ? PT Next Visit Plan Review exercises as needed. Gentle manual/PROM/mobs for wrist, fingers and shoulder. Initiate strengthening if able with wrist and shoulder isometrics. Continue table AAROM for shoulder.   ? PT Home Exercise Plan Access Code [email or web address]   ? Consulted and Agree with Plan of Care Patient   ? ?  ?  ? ?  ? ? ?Patient will benefit from  skilled therapeutic intervention in order to improve the following deficits and impairments:  Decreased range of motion, Increased fascial restricitons, Decreased activity tolerance, Pain, Increased muscle  spasms, Hypomobility, Impaired flexibility, Improper body mechanics, Decreased mobility, Decreased strength, Postural dysfunction, Impaired UE functional use, Increased edema ? ?Visit Diagnosis: ?Stiffness of left wrist, not elsewhere classified ? ?Pain in left wrist ? ?Acute pain of left shoulder ? ?Stiffness of left shoulder, not elsewhere classified ? ?Muscle weakness (generalized) ? ?Localized edema ? ? ? ? ?Problem List ?Patient Active Problem List  ? Diagnosis Date Noted  ? Radial styloid fracture 09/17/2021  ? Contusion of left shoulder 09/17/2021  ? Lumbar foraminal stenosis 09/12/2021  ? Weight loss 07/30/2021  ? Restrictive lung disease 05/15/2021  ? S/P stroke due to cerebrovascular disease 09/17/2018  ? Primary osteoarthritis of left hip 07/08/2017  ? Primary osteoarthritis of both knees 02/25/2017  ? Lumbar spondylosis 12/03/2016  ? Internal derangement of left knee 10/09/2016  ? History of breast cancer 11/09/2012  ? Benign hematuria 07/05/2010  ? POSTMENOPAUSAL STATUS 03/29/2010  ? IMPAIRED FASTING GLUCOSE 12/11/2008  ? LEG PAIN, BILATERAL 01/20/2008  ? HIP PAIN, RIGHT 01/05/2008  ? Disorder of bone and cartilage 02/26/2007  ? Hypothyroidism 04/28/2006  ? Essential hypertension 04/28/2006  ? MITRAL VALVE DISORDER 04/28/2006  ? ? ?Darrel Baroni April Gordy Levan, PT, DPT ?11/05/2021, 12:51 PM ? ?[place] ?[facility] ?[address] ?[place], [place], [address] ?Phone: [phone]   Fax:  [phone] ? ?Name: Jayley Hustead ?MRN: [id number] ?Date of Birth: 10-08-1940 ? ? ? ?

## 2021-11-05 NOTE — Assessment & Plan Note (Signed)
Am concerned about her weight loss and I am concerned that we have not been able to get her in with a nutritionist.  She will call us back with the name of the 1 in [facility] I think this would be incredibly important to get her in with someone who can create a strategy for foods that she likes to really get her calories up we also discussed the importance of eating at least 3 meals a day and a snack.  She has not been consistent with that as she just does not have much of an appetite. ?

## 2021-11-05 NOTE — Assessment & Plan Note (Signed)
With her weight loss plan to recheck TSH.  Did look great in January but I do want to follow it carefully. ?

## 2021-11-05 NOTE — Assessment & Plan Note (Signed)
With her significant weight loss she is at high risk for recurring fracture.  Would like to recheck her vitamin D levels today to make sure they are adequate for bone healing. ?

## 2021-11-05 NOTE — Progress Notes (Signed)
? ?Established Patient Office Visit ? ?Subjective   ?Patient ID: Kathleen [name], female    DOB: 07-29-1940  Age: 81 y.o. MRN: [id number] ? ?Chief Complaint  ?Patient presents with  ? Urinary Tract Infection  ?  Blood in urine, fatigue for 1 week   ? Discuss Cysts on Kidney  ?  Patient would like to know if anything needs to be done about the cysts on her kidney.   ? ? ?HPI ? ?She has a history of recurrent UTIs and noticed about a week and a half ago some blood in her urine she says she just noticed it that 1 day she has not noticed it since was but was concerned and wanted to have it evaluated.  She does have microscopic hematuria which is more chronic and has had work-up in the past. ? ?She is also just felt more tired and fatigued.  She fell at the end of February and injured her left shoulder and fractured her left wrist.  She is now in physical therapy and has been 10-2 sessions thus far.  She has not been able to work, and this has been stressful. ? ?She has noticed a little bit more pain on her left side just below her rib cage between her hip and her ribs.  She says its bothered her so much 1 day while she was out shopping that she had to cut the trip short and go home.  It is worse particularly with prolonged standing and feels better if she sits down and rests. ? ?She is also been particularly concerned about her weight loss.  It has been going on for several months at this point.  She has been gradually losing weight and admits that she has not been keeping up her nutrition.  We had actually referred her to a nutritionist in January, 3 months ago, but they were booked out she was unable to get an appointment.  She is getting the name of a nutritionist in [facility] from a friend and would like to be referred there she will let us know as soon as she gets the name. ? ? ?   ?ROS ? ?  ?Objective:  ?  ? ?BP (!) 157/92   Pulse (!) 46   Resp 16   Ht 5' (1.524 m)   Wt 108 lb (49 kg)   SpO2 97%   BMI 21.09  kg/m?  ? ? ?Physical Exam ?Vitals and nursing note reviewed.  ?Constitutional:   ?   Appearance: She is well-developed.  ?HENT:  ?   Head: Normocephalic and atraumatic.  ?Cardiovascular:  ?   Rate and Rhythm: Normal rate and regular rhythm.  ?   Heart sounds: Normal heart sounds.  ?Pulmonary:  ?   Effort: Pulmonary effort is normal.  ?   Comments: Diffuse coarse breath sounds with a few rhonchi in the right upper lobe. ?Skin: ?   General: Skin is warm and dry.  ?Neurological:  ?   Mental Status: She is alert and oriented to person, place, and time.  ?Psychiatric:     ?   Behavior: Behavior normal.  ? ? ? ?Results for orders placed or performed in visit on 11/05/21  ?POCT URINALYSIS DIP (CLINITEK)  ?Result Value Ref Range  ? Color, UA yellow yellow  ? Clarity, UA cloudy (A) clear  ? Glucose, UA negative negative mg/dL  ? Bilirubin, UA negative negative  ? Ketones, POC UA negative negative mg/dL  ? Spec Grav,  UA 1.020 1.010 - 1.025  ? Blood, UA moderate (A) negative  ? pH, UA 6.0 5.0 - 8.0  ? POC PROTEIN,UA negative negative, trace  ? Urobilinogen, UA 0.2 0.2 or 1.0 E.U./dL  ? Nitrite, UA Negative Negative  ? Leukocytes, UA Negative Negative  ? ? ? ? ?The ASCVD Risk score (Arnett DK, et al., 2019) failed to calculate for the following reasons: ?  The 2019 ASCVD risk score is only valid for ages 63 to 61 ? ?  ?Assessment & Plan:  ? ?Problem List Items Addressed This Visit   ? ?  ? Endocrine  ? Hypothyroidism  ?  With her weight loss plan to recheck TSH.  Did look great in January but I do want to follow it carefully. ? ?  ?  ? Relevant Orders  ? CBC  ? COMPLETE METABOLIC PANEL WITH GFR  ? Fe+TIBC+Fer  ? VITAMIN D 25 Hydroxy (Vit-D Deficiency, Fractures)  ? TSH  ?  ? Musculoskeletal and Integument  ? Disorder of bone and cartilage  ?  With her significant weight loss she is at high risk for recurring fracture.  Would like to recheck her vitamin D levels today to make sure they are adequate for bone healing. ? ?  ?  ?  Relevant Orders  ? CBC  ? COMPLETE METABOLIC PANEL WITH GFR  ? Fe+TIBC+Fer  ? VITAMIN D 25 Hydroxy (Vit-D Deficiency, Fractures)  ? TSH  ?  ? Other  ? Weight loss  ?  Am concerned about her weight loss and I am concerned that we have not been able to get her in with a nutritionist.  She will call us back with the name of the 1 in [facility] I think this would be incredibly important to get her in with someone who can create a strategy for foods that she likes to really get her calories up we also discussed the importance of eating at least 3 meals a day and a snack.  She has not been consistent with that as she just does not have much of an appetite. ? ?  ?  ? ?Other Visit Diagnoses   ? ? Dysuria    -  Primary  ? Relevant Orders  ? POCT URINALYSIS DIP (CLINITEK) (Completed)  ? Urine Culture  ? Urinalysis, microscopic only  ? CBC  ? COMPLETE METABOLIC PANEL WITH GFR  ? Fe+TIBC+Fer  ? VITAMIN D 25 Hydroxy (Vit-D Deficiency, Fractures)  ? TSH  ? Other fatigue      ? Relevant Orders  ? CBC  ? COMPLETE METABOLIC PANEL WITH GFR  ? Fe+TIBC+Fer  ? VITAMIN D 25 Hydroxy (Vit-D Deficiency, Fractures)  ? TSH  ? ?  ? ? ?Hematuria-we will send for microscopic evaluation as well as a urine culture we will call with results once available. ? ?Left side pain-unclear etiology it sounds like it is probably actually coming from her back. ? ?Return in about 1 month (around 12/05/2021) for fatigue.  ? ? ?Beatrice Lecher, MD ? ?

## 2021-11-06 ENCOUNTER — Telehealth: Payer: Self-pay | Admitting: Orthopaedic Surgery

## 2021-11-06 LAB — IRON,TIBC AND FERRITIN PANEL
%SAT: 26 % (calc) (ref 16–45)
Ferritin: 38 ng/mL (ref 16–288)
Iron: 81 ug/dL (ref 45–160)
TIBC: 314 mcg/dL (calc) (ref 250–450)

## 2021-11-06 LAB — COMPLETE METABOLIC PANEL WITH GFR
AG Ratio: 2 (calc) (ref 1.0–2.5)
ALT: 12 U/L (ref 6–29)
AST: 19 U/L (ref 10–35)
Albumin: 4.1 g/dL (ref 3.6–5.1)
Alkaline phosphatase (APISO): 65 U/L (ref 37–153)
BUN/Creatinine Ratio: 43 (calc) — ABNORMAL HIGH (ref 6–22)
BUN: 32 mg/dL — ABNORMAL HIGH (ref 7–25)
CO2: 28 mmol/L (ref 20–32)
Calcium: 9.4 mg/dL (ref 8.6–10.4)
Chloride: 104 mmol/L (ref 98–110)
Creat: 0.75 mg/dL (ref 0.60–0.95)
Globulin: 2.1 g/dL (calc) (ref 1.9–3.7)
Glucose, Bld: 97 mg/dL (ref 65–99)
Potassium: 3.8 mmol/L (ref 3.5–5.3)
Sodium: 141 mmol/L (ref 135–146)
Total Bilirubin: 0.7 mg/dL (ref 0.2–1.2)
Total Protein: 6.2 g/dL (ref 6.1–8.1)
eGFR: 80 mL/min/{1.73_m2} (ref >=60)

## 2021-11-06 LAB — CBC
HCT: 39 % (ref 35.0–45.0)
Hemoglobin: 13.2 g/dL (ref 11.7–15.5)
MCH: 31.3 pg (ref 27.0–33.0)
MCHC: 33.8 g/dL (ref 32.0–36.0)
MCV: 92.4 fL (ref 80.0–100.0)
MPV: 12.5 fL (ref 7.5–12.5)
Platelets: 164 10*3/uL (ref 140–400)
RBC: 4.22 10*6/uL (ref 3.80–5.10)
RDW: 12.2 % (ref 11.0–15.0)
WBC: 7.5 10*3/uL (ref 3.8–10.8)

## 2021-11-06 LAB — TSH: TSH: 0.51 mIU/L (ref 0.40–4.50)

## 2021-11-06 LAB — VITAMIN D 25 HYDROXY (VIT D DEFICIENCY, FRACTURES): Vit D, 25-Hydroxy: 33 ng/mL (ref 30–100)

## 2021-11-06 NOTE — Progress Notes (Signed)
Call patient: There is a fair number of red blood cells in her urine normally she just shows up with a few.  So would like to repeat this in 1 week just to see if it is transient or if it is persisting.  She does look a little dry on her blood work so really encouraged her to try to increase her fluid intake to stay hydrated.  Liver function looks great.  Blood count is normal no sign of anemia or infection.  Iron is looking a little better.  The ferritin was 32 and is now up to 38 we would like to get it above 40 so continue taking iron supplements.  And then we will plan to recheck again in about  4 months.  Him and he still on the lower end.  How much vitamin D is she taking daily?  It is important for bone health to reduce risk for fractures.  Her thyroid level has dropped a little bit meaning that she is overmedicated but that is probably because she has had significant weight loss without actually adjusting the thyroid medication dose.  So would like to have her take a half of a tab 1 day a week and a whole tab the other 6 days a week and then lets plan to recheck it again in about 3 months.  If she is able to put some weight back on we can probably go back to a whole tab daily.

## 2021-11-06 NOTE — Telephone Encounter (Signed)
Pt is calling to speak to Collier Bullock regarding forms  ?

## 2021-11-06 NOTE — Telephone Encounter (Signed)
I called, patient is not there. Her husband will have her call me back. ?

## 2021-11-06 NOTE — Telephone Encounter (Signed)
Pt called again

## 2021-11-07 ENCOUNTER — Encounter: Payer: Self-pay | Admitting: Physical Therapy

## 2021-11-07 ENCOUNTER — Ambulatory Visit: Payer: Medicare HMO | Admitting: Physical Therapy

## 2021-11-07 DIAGNOSIS — R600 Localized edema: Secondary | ICD-10-CM

## 2021-11-07 DIAGNOSIS — M25612 Stiffness of left shoulder, not elsewhere classified: Secondary | ICD-10-CM

## 2021-11-07 DIAGNOSIS — M6281 Muscle weakness (generalized): Secondary | ICD-10-CM | POA: Diagnosis not present

## 2021-11-07 DIAGNOSIS — G8929 Other chronic pain: Secondary | ICD-10-CM

## 2021-11-07 DIAGNOSIS — M25532 Pain in left wrist: Secondary | ICD-10-CM | POA: Diagnosis not present

## 2021-11-07 DIAGNOSIS — R293 Abnormal posture: Secondary | ICD-10-CM | POA: Diagnosis not present

## 2021-11-07 DIAGNOSIS — R29898 Other symptoms and signs involving the musculoskeletal system: Secondary | ICD-10-CM

## 2021-11-07 DIAGNOSIS — M25512 Pain in left shoulder: Secondary | ICD-10-CM

## 2021-11-07 DIAGNOSIS — M25632 Stiffness of left wrist, not elsewhere classified: Secondary | ICD-10-CM | POA: Diagnosis not present

## 2021-11-07 DIAGNOSIS — M5441 Lumbago with sciatica, right side: Secondary | ICD-10-CM | POA: Diagnosis not present

## 2021-11-07 LAB — URINE CULTURE
MICRO NUMBER:: 13283190
Result:: NO GROWTH
SPECIMEN QUALITY:: ADEQUATE

## 2021-11-07 LAB — URINALYSIS, MICROSCOPIC ONLY
Bacteria, UA: NONE SEEN /HPF
Hyaline Cast: NONE SEEN /LPF
Squamous Epithelial / HPF: NONE SEEN /HPF (ref <=5)
WBC, UA: NONE SEEN /HPF (ref 0–5)

## 2021-11-07 NOTE — Telephone Encounter (Signed)
I spoke with patient. She is having a difficult time with her employer and the work note that was given. New note emailed to [email or web address] and [email or web address] per patient request. ?

## 2021-11-07 NOTE — Progress Notes (Signed)
Call patient: Final urine culture is negative.

## 2021-11-07 NOTE — Therapy (Signed)
[place] ?[facility] ?[address] ?[place], [place], [address] ?Phone: [phone]   Fax:  [phone] ? ?Physical Therapy Treatment ? ?Patient Details  ?Name: Kathleen [name] ?MRN: [id number] ?Date of Birth: December 24, 1940 ?Referring Provider (PT): Marybelle Killings, MD ? ? ?Encounter Date: 11/07/2021 ? ? PT End of Session - 11/07/21 1450   ? ? Visit Number 3   ? Number of Visits 12   ? Date for PT Re-Evaluation 12/12/21   ? Authorization Type [employer]   ? Authorization - Visit Number 3   ? Progress Note Due on Visit 10   ? PT Start Time 1450   ? PT Stop Time 1535   ? PT Time Calculation (min) 45 min   ? Activity Tolerance Patient tolerated treatment well   ? Behavior During Therapy [facility] for tasks assessed/performed   ? ?  ?  ? ?  ? ? ?Past Medical History:  ?Diagnosis Date  ? Cancer [facility])   ? Hx RT breast infiltrated ductal CA previously on tamoxifen and armidex   ? Herpes simplex   ? lt eye  ? Hypertension   ? Kidney stones   ? history  ? MVP (mitral valve prolapse)   ? asymptomatic  ? Personal history of chemotherapy   ? Personal history of radiation therapy   ? ? ?Past Surgical History:  ?Procedure Laterality Date  ? ABDOMINAL HYSTERECTOMY    ? partial  ? BREAST BIOPSY    ? BREAST LUMPECTOMY Right 2000  ? RT  ? CERVICAL LAMINECTOMY  1986  ? SHOULDER SURGERY    ? rotator cuff repair  ? throidectomy  1988  ? For cancer   ? ? ?There were no vitals filed for this visit. ? ? Subjective Assessment - 11/07/21 1450   ? ? Subjective Pt is trying to use hand more. Much better since Tuesday. Pt also reporting marked left back pain to the point of feeling nauseous when she has been standing/walking too long.   ? Pertinent History stroke 2018; cervical disc sx ~ 30 yrs ago; arthritis; scoliosis; HTN   ? Patient Stated Goals Get stronger, use left hand and arm with less pain   ? Currently in Pain? Yes   ? Pain Score 2    ? Pain Location Shoulder   ? Pain Orientation Left   ? Pain  Descriptors / Indicators Aching   ? Pain Type Acute pain   ? Pain Score 4   ? Pain Location Wrist   ? Pain Orientation Left   ? Pain Descriptors / Indicators Sharp   ? Pain Type Acute pain   ? ?  ?  ? ?  ? ? ? ? ? ? ? ? ? ? ? ? ? ? ? ? ? ? ? ? [facility] Adult PT Treatment/Exercise - 11/07/21 0001   ? ?  ? Self-Care  ? Self-Care Other Self-Care Comments   ? Other Self-Care Comments  MFR with ball to palmar surface of hand; TPR with ball in sitting and standing to left QL   ?  ? Hand Exercises  ? MCPJ Flexion AROM;Left;10 reps   ? MCPJ Flexion Limitations Also ABD x 10 digit 1   ? Thumb Opposition to each finger x 2 reps   ? Digit Abduction/Adduction x 5   ? Other Hand Exercises active full hand opposition and release with stretch   ?  ? Wrist Exercises  ? Wrist Radial Deviation AROM;20 reps;Left   ?  Wrist Ulnar Deviation AROM;Left;20 reps   ? Other wrist exercises reviewed table shoulder slides for flex and ABD for pt to do at home   ?  ? Manual Therapy  ? Manual Therapy Joint mobilization;Soft tissue mobilization   ? Manual therapy comments skilled palpation and monitoring of soft tissues during DN   ? Joint Mobilization L 1st MCP and CMC grade III mob into abduction and ext   ? Soft tissue mobilization STM flexor, abductor pollicis, palmar surface of hand and 1st interossei   ? ?  ?  ? ?  ? ? ? Trigger Point Dry Needling - 11/07/21 0001   ? ? Consent Given? Yes   ? Education Handout Provided Previously provided   ? Muscles Treated Wrist/Hand Opponens pollicis;Flexor pollicis brevis   ? Dry Needling Comments left   ? Other Dry Needling left 1st interossei with twitch response   ? Opponens pollicis Response Twitch response elicited;Palpable increased muscle length   ? Flexor pollicis brevis Response Palpable increased muscle length   ? ?  ?  ? ?  ? ? ? ? ? ? ? ? PT Education - 11/07/21 1548   ? ? Education Details MFR using ball - see self care   ? Person(s) Educated Patient   ? Methods Explanation;Demonstration   ?  Comprehension Verbalized understanding;Returned demonstration   ? ?  ?  ? ?  ? ? ? ? ? ? PT Long Term Goals - 10/31/21 1254   ? ?  ? PT LONG TERM GOAL #1  ? Title Pt will be ind with HEP and progressing her exercises at home   ? Time 6   ? Period Weeks   ? Status New   ? Target Date 12/12/21   ?  ? PT LONG TERM GOAL #2  ? Title Pt will be able to elevate L shoulder >100 deg for overhead activity   ? Time 6   ? Period Weeks   ? Status New   ? Target Date 12/12/21   ?  ? PT LONG TERM GOAL #3  ? Title Pt will be able to lift and carry at least 40# per her work requirements   ? Time 6   ? Period Weeks   ? Status New   ? Target Date 12/12/21   ?  ? PT LONG TERM GOAL #4  ? Title Pt will demo L = R wrist/hand AROM for improved ability to open jars and bottles   ? Time 6   ? Period Weeks   ? Status New   ? Target Date 12/12/21   ? ?  ?  ? ?  ? ? ? ? ? ? ? ? Plan - 11/07/21 1548   ? ? Clinical Impression Statement Kathleen [name] reports improvement in use of hand since last treatment. We did an initial trial of TPDN with excellent results. She was able to open her hand fully and actively move her thumb more freely and with less pain including with opposition. She also reported marked low back pain at times when she has been standing and walking a lot to the point of feeling nauseous. She has marked pain in her QL with palpation. MFR with ball was given for HEP.   ? PT Treatment/Interventions ADLs/Self Care Home Management;Aquatic Therapy;Cryotherapy;Electrical Stimulation;Iontophoresis '4mg'$ /ml Dexamethasone;Moist Heat;Ultrasound;Functional mobility training;Therapeutic activities;Therapeutic exercise;Neuromuscular re-education;Patient/family education;Manual techniques;Dry needling;Taping;Passive range of motion;Vasopneumatic Device   ? PT Next Visit Plan Review exercises as needed. Gentle manual/PROM/mobs for wrist, fingers  and shoulder. Initiate strengthening if able with wrist and shoulder isometrics. Continue table AAROM for  shoulder.   ? ?  ?  ? ?  ? ? ?Patient will benefit from skilled therapeutic intervention in order to improve the following deficits and impairments:  Decreased range of motion, Increased fascial restricitons, Decreased activity tolerance, Pain, Increased muscle spasms, Hypomobility, Impaired flexibility, Improper body mechanics, Decreased mobility, Decreased strength, Postural dysfunction, Impaired UE functional use, Increased edema ? ?Visit Diagnosis: ?Stiffness of left wrist, not elsewhere classified ? ?Pain in left wrist ? ?Acute pain of left shoulder ? ?Stiffness of left shoulder, not elsewhere classified ? ?Muscle weakness (generalized) ? ?Localized edema ? ?Chronic bilateral low back pain with right-sided sciatica ? ?Other symptoms and signs involving the musculoskeletal system ? ?Abnormal posture ? ? ? ? ?Problem List ?Patient Active Problem List  ? Diagnosis Date Noted  ? Radial styloid fracture 09/17/2021  ? Contusion of left shoulder 09/17/2021  ? Lumbar foraminal stenosis 09/12/2021  ? Weight loss 07/30/2021  ? Restrictive lung disease 05/15/2021  ? S/P stroke due to cerebrovascular disease 09/17/2018  ? Primary osteoarthritis of left hip 07/08/2017  ? Primary osteoarthritis of both knees 02/25/2017  ? Lumbar spondylosis 12/03/2016  ? Internal derangement of left knee 10/09/2016  ? History of breast cancer 11/09/2012  ? Benign hematuria 07/05/2010  ? POSTMENOPAUSAL STATUS 03/29/2010  ? IMPAIRED FASTING GLUCOSE 12/11/2008  ? LEG PAIN, BILATERAL 01/20/2008  ? HIP PAIN, RIGHT 01/05/2008  ? Disorder of bone and cartilage 02/26/2007  ? Hypothyroidism 04/28/2006  ? Essential hypertension 04/28/2006  ? MITRAL VALVE DISORDER 04/28/2006  ? ? ?Madelyn Flavors, PT ?11/07/2021, 3:54 PM ? ?[place] ?[facility] ?[place] ?[place], [place], [address] ?Phone: [phone]   Fax:  [phone] ? ?Name: Kathleen [name] ?MRN: [id number] ?Date of Birth: [birth year] ? ? ? ?

## 2021-11-09 IMAGING — DX DG CHEST 2V
2 series · 2 of 2 positions shown · non-contrast
Comparison: 05/28/2007

CLINICAL DATA: Several weeks of wheezing.

EXAM:
CHEST - 2 VIEW

[chest pa]
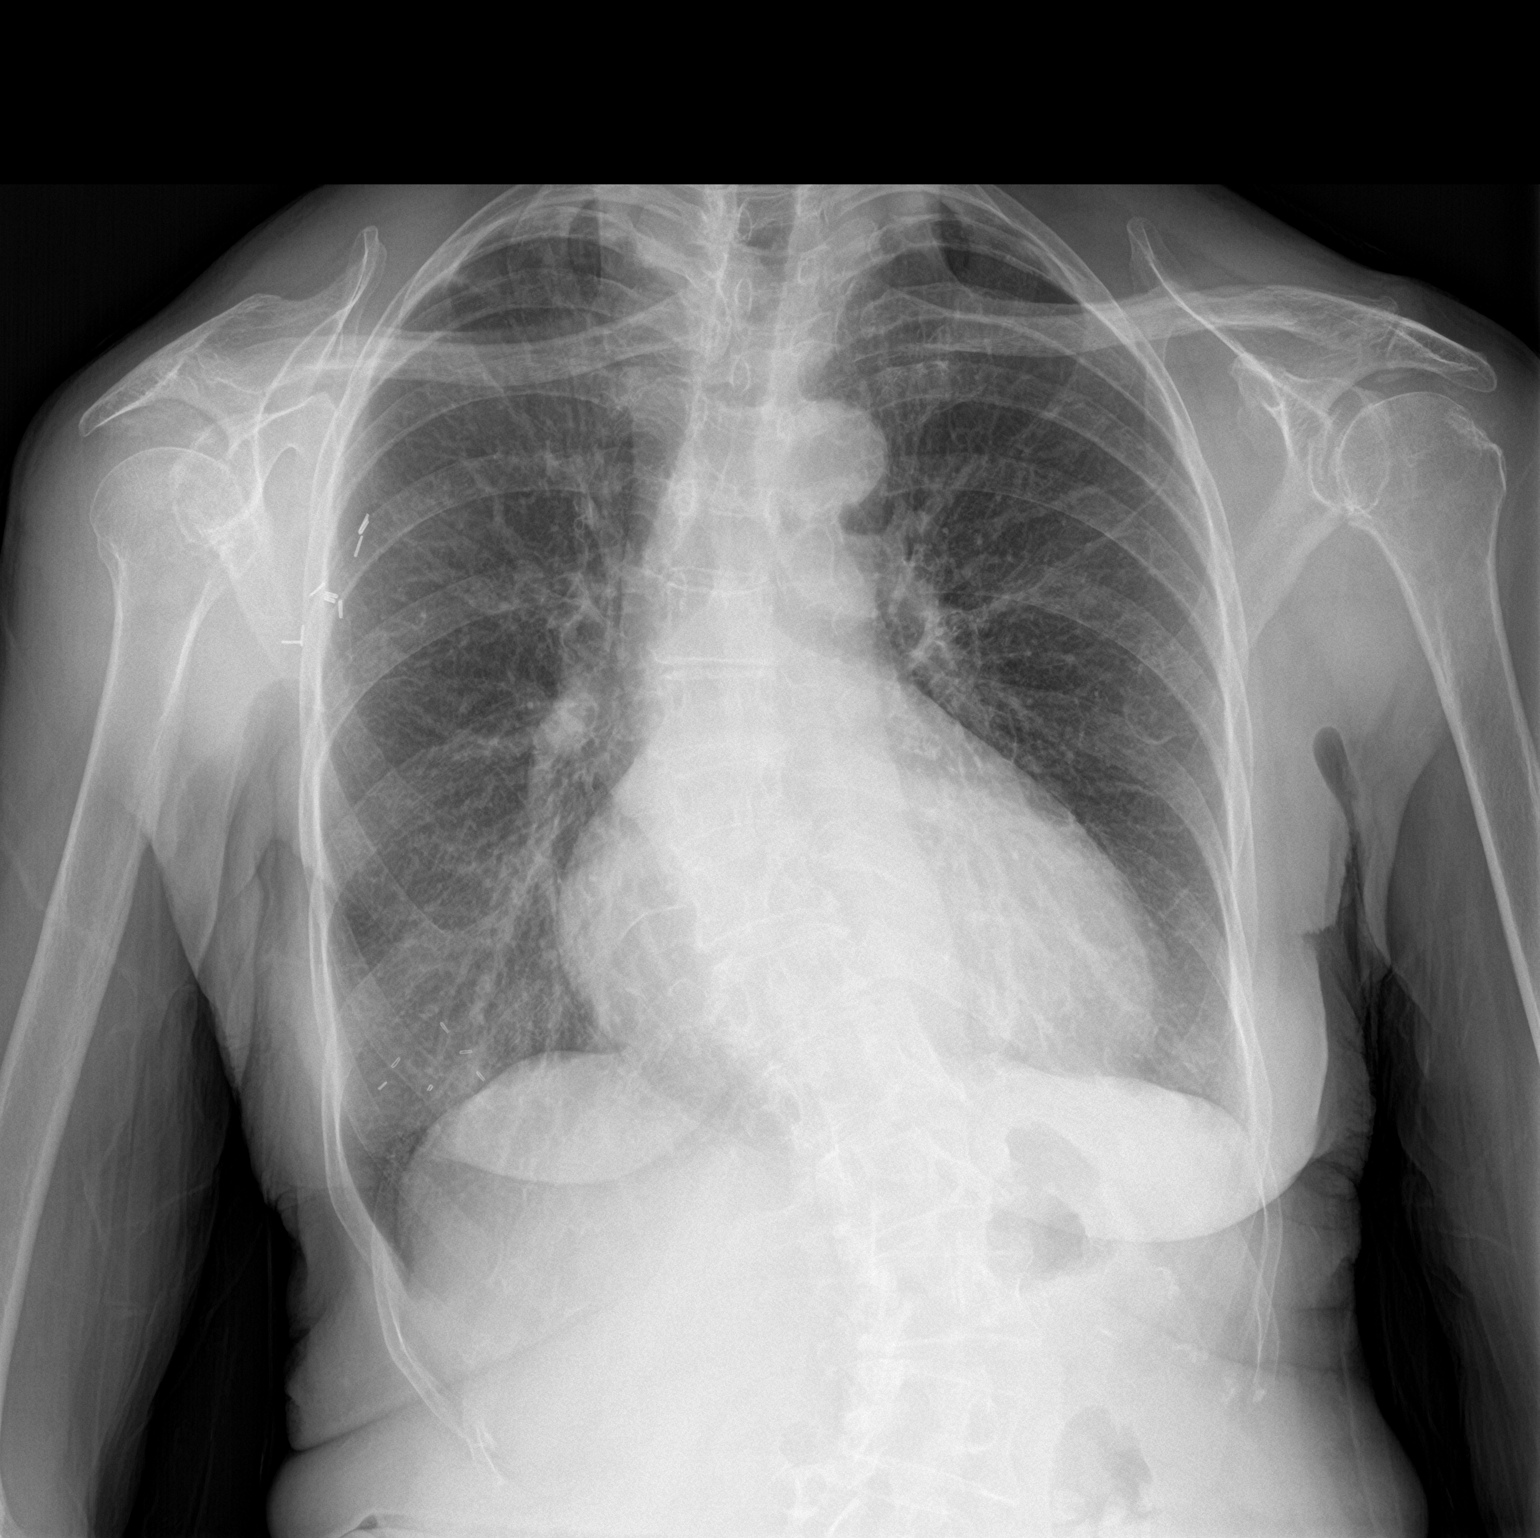

[chest lat]
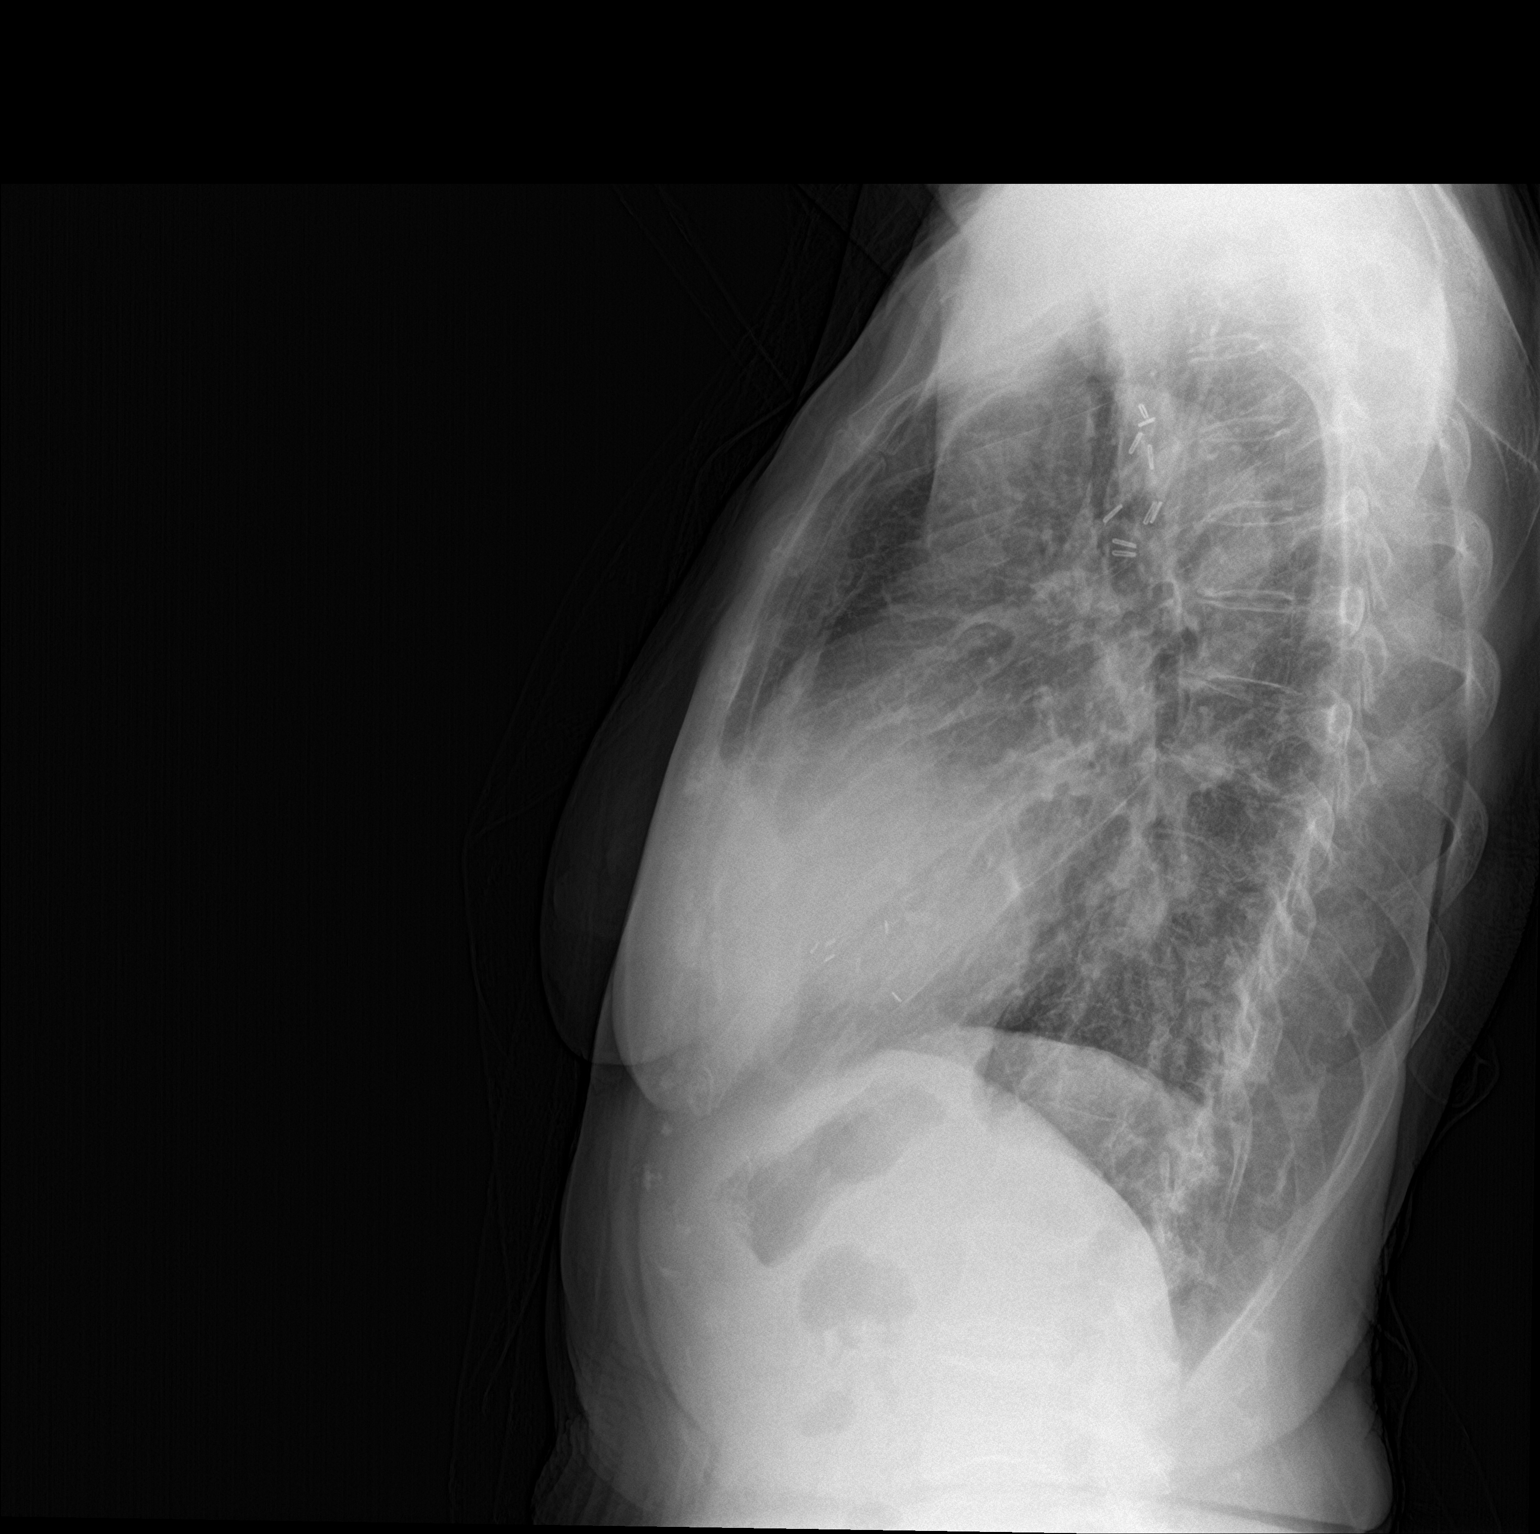

[2 of 2 positions shown; findings below may reference images not displayed]

FINDINGS: Lungs are adequately inflated without focal airspace consolidation
or effusion. Borderline cardiomegaly. Surgical clips over the right
lower chest and axilla. Moderate biphasic curvature of the
thoracolumbar spine which appears worse.
IMPRESSION: 1. No acute cardiopulmonary disease.
2. Borderline cardiomegaly.

## 2021-11-11 ENCOUNTER — Other Ambulatory Visit: Payer: Self-pay

## 2021-11-11 ENCOUNTER — Ambulatory Visit: Payer: Medicare HMO | Admitting: Physical Therapy

## 2021-11-11 DIAGNOSIS — R293 Abnormal posture: Secondary | ICD-10-CM | POA: Diagnosis not present

## 2021-11-11 DIAGNOSIS — M6281 Muscle weakness (generalized): Secondary | ICD-10-CM | POA: Diagnosis not present

## 2021-11-11 DIAGNOSIS — M25532 Pain in left wrist: Secondary | ICD-10-CM

## 2021-11-11 DIAGNOSIS — M25512 Pain in left shoulder: Secondary | ICD-10-CM

## 2021-11-11 DIAGNOSIS — R600 Localized edema: Secondary | ICD-10-CM | POA: Diagnosis not present

## 2021-11-11 DIAGNOSIS — M25632 Stiffness of left wrist, not elsewhere classified: Secondary | ICD-10-CM

## 2021-11-11 DIAGNOSIS — R319 Hematuria, unspecified: Secondary | ICD-10-CM | POA: Diagnosis not present

## 2021-11-11 DIAGNOSIS — R29898 Other symptoms and signs involving the musculoskeletal system: Secondary | ICD-10-CM | POA: Diagnosis not present

## 2021-11-11 DIAGNOSIS — M5441 Lumbago with sciatica, right side: Secondary | ICD-10-CM | POA: Diagnosis not present

## 2021-11-11 DIAGNOSIS — M25612 Stiffness of left shoulder, not elsewhere classified: Secondary | ICD-10-CM

## 2021-11-11 NOTE — Therapy (Signed)
[place] ?[facility] ?[address] ?[place], [place], [address] ?Phone: [phone]   Fax:  [phone] ? ?Physical Therapy Treatment ? ?Patient Details  ?Name: Kathleen [name] ?MRN: [id number] ?Date of Birth: [birth year] ?Referring Provider (PT): Marybelle Killings, MD ? ? ?Encounter Date: 11/11/2021 ? ? PT End of Session - 11/11/21 1348   ? ? Visit Number 4   ? Number of Visits 12   ? Date for PT Re-Evaluation 12/12/21   ? Authorization Type [employer]   ? Authorization - Visit Number 4   ? Progress Note Due on Visit 10   ? PT Start Time 1348   ? PT Stop Time 1430   ? PT Time Calculation (min) 42 min   ? Activity Tolerance Patient tolerated treatment well   ? Behavior During Therapy [facility] for tasks assessed/performed   ? ?  ?  ? ?  ? ? ?Past Medical History:  ?Diagnosis Date  ? Cancer [facility])   ? Hx RT breast infiltrated ductal CA previously on tamoxifen and armidex   ? Herpes simplex   ? lt eye  ? Hypertension   ? Kidney stones   ? history  ? MVP (mitral valve prolapse)   ? asymptomatic  ? Personal history of chemotherapy   ? Personal history of radiation therapy   ? ? ?Past Surgical History:  ?Procedure Laterality Date  ? ABDOMINAL HYSTERECTOMY    ? partial  ? BREAST BIOPSY    ? BREAST LUMPECTOMY Right 2000  ? RT  ? CERVICAL LAMINECTOMY  1986  ? SHOULDER SURGERY    ? rotator cuff repair  ? throidectomy  1988  ? For cancer   ? ? ?There were no vitals filed for this visit. ? ? Subjective Assessment - 11/11/21 1349   ? ? Subjective Pt states she feels she has a bladder infection. Pt reports continued L low back soreness. Pt reports soreness and bruising from the TPDN.   ? Pertinent History stroke 2018; cervical disc sx ~ 30 yrs ago; arthritis; scoliosis; HTN   ? How long can you sit comfortably? n/a   ? How long can you stand comfortably? n/a   ? How long can you walk comfortably? n/a   ? Diagnostic tests anterolisthesis lumbar spine; ankle xray (-)   ? Patient Stated Goals  Get stronger, use left hand and arm with less pain   ? ?  ?  ? ?  ? ? ? ? ? [facility] PT Assessment - 11/11/21 0001   ? ?  ? Assessment  ? Medical Diagnosis S52.515D (ICD-10-CM) - Closed nondisplaced fracture of styloid process of left radius with routine healing, subsequent encounter  S40.012A (ICD-10-CM) - Contusion of left shoulder, initial encounter  Fall with left shoulder impingement 09/12/2021, left radial styloid fracture healed   ? Referring Provider (PT) Marybelle Killings, MD   ? ?  ?  ? ?  ? ? ? ? ? ? ? ? ? ? ? ? ? ? ? ? [facility] Adult PT Treatment/Exercise - 11/11/21 0001   ? ?  ? Shoulder Exercises: Seated  ? Retraction Strengthening;20 reps   ? External Rotation Strengthening;Left;20 reps   ? Flexion AAROM;10 reps   ? Flexion Limitations table   ? Abduction AAROM;10 reps   ? ABduction Limitations table   ?  ? Shoulder Exercises: Isometric Strengthening  ? Flexion 5X5"   ? Extension 5X5"   ? ABduction 5X5"   ?  ?  Hand Exercises  ? MCPJ Extension Strengthening;Left;10 reps   ?  ? Manual Therapy  ? Joint Mobilization L 1st MCP and CMC grade III mob into abduction and ext; wrist flexion/ext   ? Soft tissue mobilization STM adductor pollicis and abductor pollicis   ? ?  ?  ? ?  ? ? ? ? ? ? ? ? ? ? ? ? ? ? ? PT Long Term Goals - 10/31/21 1254   ? ?  ? PT LONG TERM GOAL #1  ? Title Pt will be ind with HEP and progressing her exercises at home   ? Time 6   ? Period Weeks   ? Status New   ? Target Date 12/12/21   ?  ? PT LONG TERM GOAL #2  ? Title Pt will be able to elevate L shoulder >100 deg for overhead activity   ? Time 6   ? Period Weeks   ? Status New   ? Target Date 12/12/21   ?  ? PT LONG TERM GOAL #3  ? Title Pt will be able to lift and carry at least 40# per her work requirements   ? Time 6   ? Period Weeks   ? Status New   ? Target Date 12/12/21   ?  ? PT LONG TERM GOAL #4  ? Title Pt will demo L = R wrist/hand AROM for improved ability to open jars and bottles   ? Time 6   ? Period Weeks   ? Status New   ? Target  Date 12/12/21   ? ?  ?  ? ?  ? ? ? ? ? ? ? ? Plan - 11/11/21 1422   ? ? Clinical Impression Statement Increased bruising noted after TPDN. Pt notes increased thumb soreness and swelling due to this. Session continued to work on gentle wrist and thumb ROM and manual work. Initiated shoulder isometrics. Most of pt's pain is during active shoulder abd.   ? Personal Factors and Comorbidities Age;Fitness;Time since onset of injury/illness/exacerbation;Profession   ? Examination-Activity Limitations Carry;Lift;Hygiene/Grooming;Toileting;Dressing;Reach Overhead;Bathing   ? Examination-Participation Restrictions Cleaning;Community Activity;Driving;Meal Prep;Yard Work;Laundry   ? Stability/Clinical Decision Making Stable/Uncomplicated   ? PT Treatment/Interventions ADLs/Self Care Home Management;Aquatic Therapy;Cryotherapy;Electrical Stimulation;Iontophoresis '4mg'$ /ml Dexamethasone;Moist Heat;Ultrasound;Functional mobility training;Therapeutic activities;Therapeutic exercise;Neuromuscular re-education;Patient/family education;Manual techniques;Dry needling;Taping;Passive range of motion;Vasopneumatic Device   ? PT Next Visit Plan Review exercises as needed. Gentle manual/PROM/mobs for wrist, fingers and shoulder. Initiate strengthening if able with wrist and shoulder isometrics. Continue table AAROM for shoulder.   ? PT Home Exercise Plan Access Code [email or web address]   ? Consulted and Agree with Plan of Care Patient   ? ?  ?  ? ?  ? ? ?Patient will benefit from skilled therapeutic intervention in order to improve the following deficits and impairments:  Decreased range of motion, Increased fascial restricitons, Decreased activity tolerance, Pain, Increased muscle spasms, Hypomobility, Impaired flexibility, Improper body mechanics, Decreased mobility, Decreased strength, Postural dysfunction, Impaired UE functional use, Increased edema ? ?Visit Diagnosis: ?Stiffness of left wrist, not elsewhere classified ? ?Pain in left wrist ? ?Acute  pain of left shoulder ? ?Stiffness of left shoulder, not elsewhere classified ? ? ? ? ?Problem List ?Patient Active Problem List  ? Diagnosis Date Noted  ? Radial styloid fracture 09/17/2021  ? Contusion of left shoulder 09/17/2021  ? Lumbar foraminal stenosis 09/12/2021  ? Weight loss 07/30/2021  ? Restrictive lung disease 05/15/2021  ? S/P stroke due to cerebrovascular disease 09/17/2018  ?  Primary osteoarthritis of left hip 07/08/2017  ? Primary osteoarthritis of both knees 02/25/2017  ? Lumbar spondylosis 12/03/2016  ? Internal derangement of left knee 10/09/2016  ? History of breast cancer 11/09/2012  ? Benign hematuria 07/05/2010  ? POSTMENOPAUSAL STATUS 03/29/2010  ? IMPAIRED FASTING GLUCOSE 12/11/2008  ? LEG PAIN, BILATERAL 01/20/2008  ? HIP PAIN, RIGHT 01/05/2008  ? Disorder of bone and cartilage 02/26/2007  ? Hypothyroidism 04/28/2006  ? Essential hypertension 04/28/2006  ? MITRAL VALVE DISORDER 04/28/2006  ? ? ?Caellum Mancil April Gordy Levan, PT, DPT ?11/11/2021, 2:30 PM ? ?[place] [facility] ?[place], [place], [address] ?Phone: [phone]   Fax:  [phone] ? ?Name: Kathleen [name] ?MRN: [id number] ?Date of Birth: 03/11/1941 ? ? ? ?

## 2021-11-11 NOTE — Progress Notes (Signed)
Ordered labs per results.  

## 2021-11-12 DIAGNOSIS — H04123 Dry eye syndrome of bilateral lacrimal glands: Secondary | ICD-10-CM | POA: Diagnosis not present

## 2021-11-12 DIAGNOSIS — H26491 Other secondary cataract, right eye: Secondary | ICD-10-CM | POA: Diagnosis not present

## 2021-11-12 DIAGNOSIS — H35372 Puckering of macula, left eye: Secondary | ICD-10-CM | POA: Diagnosis not present

## 2021-11-12 LAB — URINALYSIS, ROUTINE W REFLEX MICROSCOPIC
Bilirubin Urine: NEGATIVE
Glucose, UA: NEGATIVE
Hgb urine dipstick: NEGATIVE
Ketones, ur: NEGATIVE
Leukocytes,Ua: NEGATIVE
Nitrite: NEGATIVE
Protein, ur: NEGATIVE
Specific Gravity, Urine: 1.021 (ref 1.001–1.035)
pH: 5.5 (ref 5.0–8.0)

## 2021-11-12 NOTE — Progress Notes (Signed)
Call patient: Urine sample looks great it looks clear.  No blood in the urine this time.

## 2021-11-14 ENCOUNTER — Ambulatory Visit: Payer: Medicare HMO | Admitting: Physical Therapy

## 2021-11-14 DIAGNOSIS — M25512 Pain in left shoulder: Secondary | ICD-10-CM

## 2021-11-14 DIAGNOSIS — M6281 Muscle weakness (generalized): Secondary | ICD-10-CM

## 2021-11-14 DIAGNOSIS — M25632 Stiffness of left wrist, not elsewhere classified: Secondary | ICD-10-CM | POA: Diagnosis not present

## 2021-11-14 DIAGNOSIS — R29898 Other symptoms and signs involving the musculoskeletal system: Secondary | ICD-10-CM | POA: Diagnosis not present

## 2021-11-14 DIAGNOSIS — R293 Abnormal posture: Secondary | ICD-10-CM | POA: Diagnosis not present

## 2021-11-14 DIAGNOSIS — M25612 Stiffness of left shoulder, not elsewhere classified: Secondary | ICD-10-CM | POA: Diagnosis not present

## 2021-11-14 DIAGNOSIS — M5441 Lumbago with sciatica, right side: Secondary | ICD-10-CM | POA: Diagnosis not present

## 2021-11-14 DIAGNOSIS — R600 Localized edema: Secondary | ICD-10-CM | POA: Diagnosis not present

## 2021-11-14 DIAGNOSIS — M25532 Pain in left wrist: Secondary | ICD-10-CM | POA: Diagnosis not present

## 2021-11-14 NOTE — Therapy (Signed)
[place] ?[facility] ?[address] ?[place], [place], [address] ?Phone: [phone]   Fax:  [phone] ? ?Physical Therapy Treatment ? ?Patient Details  ?Name: Kathleen [name] ?MRN: [id number] ?Date of Birth: Jan 12, 1941 ?Referring Provider (PT): Marybelle Killings, MD ? ? ?Encounter Date: 11/14/2021 ? ? PT End of Session - 11/14/21 1413   ? ? Visit Number 5   ? Number of Visits 12   ? Date for PT Re-Evaluation 12/12/21   ? Authorization Type [employer]   ? Authorization - Visit Number 5   ? Progress Note Due on Visit 10   ? PT Start Time 1410   Late arrival  ? PT Stop Time 1445   ? PT Time Calculation (min) 35 min   ? Activity Tolerance Patient tolerated treatment well   ? Behavior During Therapy [facility] for tasks assessed/performed   ? ?  ?  ? ?  ? ? ?Past Medical History:  ?Diagnosis Date  ? Cancer [facility])   ? Hx RT breast infiltrated ductal CA previously on tamoxifen and armidex   ? Herpes simplex   ? lt eye  ? Hypertension   ? Kidney stones   ? history  ? MVP (mitral valve prolapse)   ? asymptomatic  ? Personal history of chemotherapy   ? Personal history of radiation therapy   ? ? ?Past Surgical History:  ?Procedure Laterality Date  ? ABDOMINAL HYSTERECTOMY    ? partial  ? BREAST BIOPSY    ? BREAST LUMPECTOMY Right 2000  ? RT  ? CERVICAL LAMINECTOMY  1986  ? SHOULDER SURGERY    ? rotator cuff repair  ? throidectomy  1988  ? For cancer   ? ? ?There were no vitals filed for this visit. ? ? Subjective Assessment - 11/14/21 1416   ? ? Subjective Pt reports some continued soreness and stiffness in her hand. Pt notes increased pain in dorsal forearm.   ? Pertinent History stroke 2018; cervical disc sx ~ 30 yrs ago; arthritis; scoliosis; HTN   ? How long can you sit comfortably? n/a   ? How long can you stand comfortably? n/a   ? How long can you walk comfortably? n/a   ? Diagnostic tests anterolisthesis lumbar spine; ankle xray (-)   ? Patient Stated Goals Get stronger, use left  hand and arm with less pain   ? Currently in Pain? Yes   ? ?  ?  ? ?  ? ? ? ? ? ? ? ? ? ? ? ? ? ? ? ? ? ? ? ? [facility] Adult PT Treatment/Exercise - 11/14/21 0001   ? ?  ? Shoulder Exercises: Pulleys  ? Flexion 2 minutes   ? Scaption 2 minutes   ? ABduction 2 minutes   ?  ? Wrist Exercises  ? Wrist Flexion AROM;Strengthening;Left;10 reps   ? Bar Weights/Barbell (Wrist Flexion) 1 lb   ? Wrist Flexion Limitations AROM first and then with 1 lb weight   ? Wrist Extension AROM;Left;10 reps;Strengthening   ? Bar Weights/Barbell (Wrist Extension) 1 lb   ? Wrist Extension Limitations AROM first and then with 1 lb weight   ? Other wrist exercises wrist flexor stretch x30 sec, wrist extensor stretch x30 sec   ? Other wrist exercises yellow therabar 2x10 "U" x10 "n"; pronation 2x10 1#, supination 2x10 1#   ?  ? Manual Therapy  ? Joint Mobilization L 1st MCP and CMC grade III mob into abduction  and ext; wrist flexion/ext   ? Soft tissue mobilization STM & TPR, wrist/hand flexors/extensors, adductor pollicis and abductor pollicis   ? ?  ?  ? ?  ? ? ? ? ? ? ? ? ? ? ? ? ? ? ? PT Long Term Goals - 10/31/21 1254   ? ?  ? PT LONG TERM GOAL #1  ? Title Pt will be ind with HEP and progressing her exercises at home   ? Time 6   ? Period Weeks   ? Status New   ? Target Date 12/12/21   ?  ? PT LONG TERM GOAL #2  ? Title Pt will be able to elevate L shoulder >100 deg for overhead activity   ? Time 6   ? Period Weeks   ? Status New   ? Target Date 12/12/21   ?  ? PT LONG TERM GOAL #3  ? Title Pt will be able to lift and carry at least 40# per her work requirements   ? Time 6   ? Period Weeks   ? Status New   ? Target Date 12/12/21   ?  ? PT LONG TERM GOAL #4  ? Title Pt will demo L = R wrist/hand AROM for improved ability to open jars and bottles   ? Time 6   ? Period Weeks   ? Status New   ? Target Date 12/12/21   ? ?  ?  ? ?  ? ? ? ? ? ? ? ? Plan - 11/14/21 1447   ? ? Clinical Impression Statement Pt maintains thumb adducted -- discussed with  pt that it's good to practice her thumb exercises but she also needs to work on extending her thumb and hand. Improved thumb extension/abduction after manual work and TPR of thumb adductor. Focus today primarily on forearm strengthening -- tends to compensate with shoulder use.   ? Personal Factors and Comorbidities Age;Fitness;Time since onset of injury/illness/exacerbation;Profession   ? Examination-Activity Limitations Carry;Lift;Hygiene/Grooming;Toileting;Dressing;Reach Overhead;Bathing   ? Examination-Participation Restrictions Cleaning;Community Activity;Driving;Meal Prep;Yard Work;Laundry   ? Stability/Clinical Decision Making Stable/Uncomplicated   ? PT Treatment/Interventions ADLs/Self Care Home Management;Aquatic Therapy;Cryotherapy;Electrical Stimulation;Iontophoresis '4mg'$ /ml Dexamethasone;Moist Heat;Ultrasound;Functional mobility training;Therapeutic activities;Therapeutic exercise;Neuromuscular re-education;Patient/family education;Manual techniques;Dry needling;Taping;Passive range of motion;Vasopneumatic Device   ? PT Next Visit Plan Review exercises as needed. Gentle manual/PROM/mobs for wrist, fingers and shoulder. Initiate strengthening if able with wrist and shoulder isometrics. Continue table AAROM for shoulder.   ? PT Home Exercise Plan Access Code [email or web address]   ? Consulted and Agree with Plan of Care Patient   ? ?  ?  ? ?  ? ? ?Patient will benefit from skilled therapeutic intervention in order to improve the following deficits and impairments:  Decreased range of motion, Increased fascial restricitons, Decreased activity tolerance, Pain, Increased muscle spasms, Hypomobility, Impaired flexibility, Improper body mechanics, Decreased mobility, Decreased strength, Postural dysfunction, Impaired UE functional use, Increased edema ? ?Visit Diagnosis: ?Stiffness of left wrist, not elsewhere classified ? ?Pain in left wrist ? ?Acute pain of left shoulder ? ?Stiffness of left shoulder, not elsewhere  classified ? ?Muscle weakness (generalized) ? ?Localized edema ? ?Other symptoms and signs involving the musculoskeletal system ? ?Abnormal posture ? ? ? ? ?Problem List ?Patient Active Problem List  ? Diagnosis Date Noted  ? Radial styloid fracture 09/17/2021  ? Contusion of left shoulder 09/17/2021  ? Lumbar foraminal stenosis 09/12/2021  ? Weight loss 07/30/2021  ? Restrictive lung disease 05/15/2021  ? S/P stroke  due to cerebrovascular disease 09/17/2018  ? Primary osteoarthritis of left hip 07/08/2017  ? Primary osteoarthritis of both knees 02/25/2017  ? Lumbar spondylosis 12/03/2016  ? Internal derangement of left knee 10/09/2016  ? History of breast cancer 11/09/2012  ? Benign hematuria 07/05/2010  ? POSTMENOPAUSAL STATUS 03/29/2010  ? IMPAIRED FASTING GLUCOSE 12/11/2008  ? LEG PAIN, BILATERAL 01/20/2008  ? HIP PAIN, RIGHT 01/05/2008  ? Disorder of bone and cartilage 02/26/2007  ? Hypothyroidism 04/28/2006  ? Essential hypertension 04/28/2006  ? MITRAL VALVE DISORDER 04/28/2006  ? ? ?Kathleen [name] April Gordy Levan, PT, DPT ?11/14/2021, 4:59 PM ? ?[place] ?[facility] ?[address] ?[place], [place], [address] ?Phone: [phone]   Fax:  [phone] ? ?Name: Kathleen [name] ?MRN: [id number] ?Date of Birth: 1941/03/21 ? ? ? ?

## 2021-11-19 ENCOUNTER — Ambulatory Visit: Payer: Medicare HMO | Admitting: Orthopaedic Surgery

## 2021-11-19 ENCOUNTER — Encounter: Payer: Self-pay | Admitting: Orthopaedic Surgery

## 2021-11-19 ENCOUNTER — Telehealth: Payer: Self-pay | Admitting: Radiology

## 2021-11-19 VITALS — BP 137/80 | HR 71 | Ht 60.0 in | Wt 108.0 lb

## 2021-11-19 DIAGNOSIS — S52515D Nondisplaced fracture of left radial styloid process, subsequent encounter for closed fracture with routine healing: Secondary | ICD-10-CM | POA: Diagnosis not present

## 2021-11-19 DIAGNOSIS — G5702 Lesion of sciatic nerve, left lower limb: Secondary | ICD-10-CM | POA: Diagnosis not present

## 2021-11-19 DIAGNOSIS — M7542 Impingement syndrome of left shoulder: Secondary | ICD-10-CM | POA: Diagnosis not present

## 2021-11-19 MED ORDER — IBUPROFEN 800 MG PO TABS: ORAL_TABLET | ORAL | 1 refills | Status: DC

## 2021-11-19 NOTE — Telephone Encounter (Signed)
New note emailed to [email or web address] and [email or web address] per patient request. 

## 2021-11-19 NOTE — Progress Notes (Signed)
? ?Office Visit Note ?  ?Patient: Kathleen [name]           ?Date of Birth: 1940/11/24           ?MRN: [id number] ?Visit Date: 11/19/2021 ?             ?Requested by: Hali Marry, MD ?[address] ?[address] ?[place],  [place] [address] ?PCP: Hali Marry, MD ? ? ?Assessment & Plan: ?Visit Diagnoses: Healed left distal radius radial styloid fracture. ? ?2.  Left shoulder impingement with high riding head likely chronic rotator cuff tear. ? ? ?Plan: Patient can continue to work on wrist range of motion exercises finger range of motion exercises and left shoulder range of motion exercises.  I will recheck her in 3 weeks.  She has persistent problems with the shoulder will need to consider MRI scan but I discussed with her in detail that with high riding head she likely has chronic significant large rotator cuff tear similar to what she had with her opposite right shoulder. ? ?Follow-Up Instructions: Return in about 3 weeks (around 12/10/2021).  ? ?Orders:  ?No orders of the defined types were placed in this encounter. ? ?Meds ordered this encounter  ?Medications  ? ibuprofen (ADVIL) 800 MG tablet  ?  Sig: TAKE ONE TABLET EVERY 8 HOURS AS NEEDED  ?  Dispense:  60 tablet  ?  Refill:  1  ? ? ? ? Procedures: ?No procedures performed ? ? ?Clinical Data: ?No additional findings. ? ? ?Subjective: ?Chief Complaint  ?Patient presents with  ? Left Shoulder - Follow-up, Pain  ? Left Wrist - Follow-up, Fracture  ?  Fall 09/12/2021  ? ? ?HPI 81 year old female returns for follow-up post fall on 09/12/2021 with left distal radius fracture.  Patient had problems with shoulder stiffness of her left shoulder and x-rays that showed high riding head suggesting rotator cuff tear.  She had previous history of opposite right shoulder surgery by Dr. Suszanne Conners many years ago.  We had discussed based on radiographs she likely has a chronic rotator cuff tear of the left shoulder with high riding head.  She is requesting a refill  of ibuprofen which was sent in.  Patient's had physical therapy x3 weeks and she has made some progress.  Still has difficulty touching thumb to fingers with some stiffness but no triggering of the thumb. ? ?Review of Systems updated unchanged.  No history of gout. ? ? ?Objective: ?Vital Signs: BP 137/80   Pulse 71   Ht 5' (1.524 m)   Wt 108 lb (49 kg)   BMI 21.09 kg/m?  ? ?Physical Exam ?Constitutional:   ?   Appearance: She is well-developed.  ?HENT:  ?   Head: Normocephalic.  ?   Right Ear: External ear normal.  ?   Left Ear: External ear normal. There is no impacted cerumen.  ?Eyes:  ?   Pupils: Pupils are equal, round, and reactive to light.  ?Neck:  ?   Thyroid: No thyromegaly.  ?   Trachea: No tracheal deviation.  ?Cardiovascular:  ?   Rate and Rhythm: Normal rate.  ?Pulmonary:  ?   Effort: Pulmonary effort is normal.  ?Abdominal:  ?   Palpations: Abdomen is soft.  ?Musculoskeletal:  ?   Cervical back: No rigidity.  ?Skin: ?   General: Skin is warm and dry.  ?Neurological:  ?   Mental Status: She is alert and oriented to person, place, and time.  ?  Psychiatric:     ?   Behavior: Behavior normal.  ? ? ?Ortho Exam no motion at the distal radius fracture site.  Positive drop arm test left shoulder positive impingement.  Passively she flexes to 120 degrees with some discomfort.  Good cervical range of motion.  No tenderness over the carpal canal ulnar nerve at the elbow is normal. ? ?Specialty Comments:  ?No specialty comments available. ? ?Imaging: ?No results found. ? ? ?PMFS History: ?Patient Active Problem List  ? Diagnosis Date Noted  ? Impingement syndrome of left shoulder 12/02/2021  ? Radial styloid fracture 09/17/2021  ? Contusion of left shoulder 09/17/2021  ? Lumbar foraminal stenosis 09/12/2021  ? Weight loss 07/30/2021  ? Restrictive lung disease 05/15/2021  ? S/P stroke due to cerebrovascular disease 09/17/2018  ? Primary osteoarthritis of left hip 07/08/2017  ? Primary osteoarthritis of both  knees 02/25/2017  ? Lumbar spondylosis 12/03/2016  ? Internal derangement of left knee 10/09/2016  ? History of breast cancer 11/09/2012  ? Benign hematuria 07/05/2010  ? POSTMENOPAUSAL STATUS 03/29/2010  ? IMPAIRED FASTING GLUCOSE 12/11/2008  ? LEG PAIN, BILATERAL 01/20/2008  ? HIP PAIN, RIGHT 01/05/2008  ? Disorder of bone and cartilage 02/26/2007  ? Hypothyroidism 04/28/2006  ? Essential hypertension 04/28/2006  ? MITRAL VALVE DISORDER 04/28/2006  ? ?Past Medical History:  ?Diagnosis Date  ? Cancer [facility])   ? Hx RT breast infiltrated ductal CA previously on tamoxifen and armidex   ? Herpes simplex   ? lt eye  ? Hypertension   ? Kidney stones   ? history  ? MVP (mitral valve prolapse)   ? asymptomatic  ? Personal history of chemotherapy   ? Personal history of radiation therapy   ?  ?Family History  ?Problem Relation Age of Onset  ? Breast cancer Mother   ?  ?Past Surgical History:  ?Procedure Laterality Date  ? ABDOMINAL HYSTERECTOMY    ? partial  ? BREAST BIOPSY    ? BREAST LUMPECTOMY Right 2000  ? RT  ? CERVICAL LAMINECTOMY  1986  ? SHOULDER SURGERY    ? rotator cuff repair  ? throidectomy  1988  ? For cancer   ? ?Social History  ? ?Occupational History  ? Occupation: Working full-time at [employer].  ?Tobacco Use  ? Smoking status: Never  ? Smokeless tobacco: Never  ?Substance and Sexual Activity  ? Alcohol use: No  ? Drug use: Never  ? Sexual activity: Not on file  ?  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  ? ? ? ? ? ? ?

## 2021-11-21 ENCOUNTER — Telehealth: Payer: Self-pay | Admitting: Orthopaedic Surgery

## 2021-11-21 ENCOUNTER — Ambulatory Visit: Payer: Medicare HMO | Attending: Family Medicine | Admitting: Physical Therapy

## 2021-11-21 DIAGNOSIS — R29898 Other symptoms and signs involving the musculoskeletal system: Secondary | ICD-10-CM | POA: Diagnosis not present

## 2021-11-21 DIAGNOSIS — R600 Localized edema: Secondary | ICD-10-CM | POA: Insufficient documentation

## 2021-11-21 DIAGNOSIS — R293 Abnormal posture: Secondary | ICD-10-CM | POA: Diagnosis not present

## 2021-11-21 DIAGNOSIS — M6281 Muscle weakness (generalized): Secondary | ICD-10-CM | POA: Diagnosis not present

## 2021-11-21 DIAGNOSIS — M25532 Pain in left wrist: Secondary | ICD-10-CM | POA: Insufficient documentation

## 2021-11-21 DIAGNOSIS — M25612 Stiffness of left shoulder, not elsewhere classified: Secondary | ICD-10-CM | POA: Insufficient documentation

## 2021-11-21 DIAGNOSIS — M25512 Pain in left shoulder: Secondary | ICD-10-CM | POA: Diagnosis not present

## 2021-11-21 DIAGNOSIS — M25632 Stiffness of left wrist, not elsewhere classified: Secondary | ICD-10-CM | POA: Diagnosis not present

## 2021-11-21 NOTE — Therapy (Signed)
[place] ?[facility] ?[address] ?[place], [place], [address] ?Phone: [phone]   Fax:  [phone] ? ?Physical Therapy Treatment ? ?Patient Details  ?Name: Kathleen [name] ?MRN: [id number] ?Date of Birth: 1940/12/17 ?Referring Provider (PT): Marybelle Killings, MD ? ? ?Encounter Date: 11/21/2021 ? ? PT End of Session - 11/21/21 1311   ? ? Visit Number 6   ? Number of Visits 12   ? Date for PT Re-Evaluation 12/12/21   ? Authorization Type [employer]   ? Authorization - Visit Number 6   ? Progress Note Due on Visit 10   ? PT Start Time 1155   ? PT Stop Time 1235   ? PT Time Calculation (min) 40 min   ? Activity Tolerance Patient tolerated treatment well   ? Behavior During Therapy [facility] for tasks assessed/performed   ? ?  ?  ? ?  ? ? ?Past Medical History:  ?Diagnosis Date  ? Cancer [facility])   ? Hx RT breast infiltrated ductal CA previously on tamoxifen and armidex   ? Herpes simplex   ? lt eye  ? Hypertension   ? Kidney stones   ? history  ? MVP (mitral valve prolapse)   ? asymptomatic  ? Personal history of chemotherapy   ? Personal history of radiation therapy   ? ? ?Past Surgical History:  ?Procedure Laterality Date  ? ABDOMINAL HYSTERECTOMY    ? partial  ? BREAST BIOPSY    ? BREAST LUMPECTOMY Right 2000  ? RT  ? CERVICAL LAMINECTOMY  1986  ? SHOULDER SURGERY    ? rotator cuff repair  ? throidectomy  1988  ? For cancer   ? ? ?There were no vitals filed for this visit. ? ? Subjective Assessment - 11/21/21 1311   ? ? 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 Does note improvement in her hand and forearm.   ? Pertinent History stroke 2018; cervical disc sx ~ 30 yrs ago; arthritis; scoliosis; HTN   ? How long can you sit comfortably? n/a   ? How long can you stand comfortably? n/a   ? How long can you walk comfortably? n/a   ? Diagnostic tests anterolisthesis lumbar spine; ankle xray (-)   ? Patient Stated Goals Get  stronger, use left hand and arm with less pain   ? Currently in Pain? Yes   ? Pain Score 2    ? Pain Location Shoulder   ? Pain Orientation Left   ? Pain Descriptors / Indicators Aching   ? Pain Type Acute pain   ? Pain Score 2   ? Pain Location Wrist   ? Pain Orientation Left   ? Pain Descriptors / Indicators Sharp   ? Pain Type Acute pain   ? ?  ?  ? ?  ? ? ? ? ? ? ? ? ? ? ? ? ? ? ? ? ? ? ? ? [facility] Adult PT Treatment/Exercise - 11/21/21 0001   ? ?  ? Shoulder Exercises: Standing  ? Other Standing Exercises shoulder flexion AAROM bending forward at counter   ? Other Standing Exercises "L" stretch with R lateral flexion x30 sec   ?  ? Shoulder Exercises: Pulleys  ? Flexion 2 minutes   ? Scaption 2 minutes   ? ABduction 2 minutes   ?  ? Hand Exercises  ? MCPJ Flexion AROM;Left;10 reps   ? MCPJ  Extension AROM;10 reps   ? Other Hand Exercises marble pick up 2x20; pouring marbles from one cup to another x5 reps   ? Other Hand Exercises twisting nut and bolts x10 twists CW & CCW   ?  ? Wrist Exercises  ? Other wrist exercises yellow therabar: twisting forward and backward 2x10   ? ?  ?  ? ?  ? ? ? ? ? ? ? ? ? ? ? ? ? ? ? PT Long Term Goals - 10/31/21 1254   ? ?  ? PT LONG TERM GOAL #1  ? Title Pt will be ind with HEP and progressing her exercises at home   ? Time 6   ? Period Weeks   ? Status New   ? Target Date 12/12/21   ?  ? PT LONG TERM GOAL #2  ? Title Pt will be able to elevate L shoulder >100 deg for overhead activity   ? Time 6   ? Period Weeks   ? Status New   ? Target Date 12/12/21   ?  ? PT LONG TERM GOAL #3  ? Title Pt will be able to lift and carry at least 40# per her work requirements   ? Time 6   ? Period Weeks   ? Status New   ? Target Date 12/12/21   ?  ? PT LONG TERM GOAL #4  ? Title Pt will demo L = R wrist/hand AROM for improved ability to open jars and bottles   ? Time 6   ? Period Weeks   ? Status New   ? Target Date 12/12/21   ? ?  ?  ? ?  ? ? ? ? ? ? ? ? Plan - 11/21/21 1314   ? ? Clinical  Impression Statement Improved hand and thumb ext/abduction. Worked primarily on strengthening pt's pinch, grip, and fine motor grasping. 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 Reinforced importance of maintaining shoulder motion -- will progress her strengthening   ? Personal Factors and Comorbidities Age;Fitness;Time since onset of injury/illness/exacerbation;Profession   ? Examination-Activity Limitations Carry;Lift;Hygiene/Grooming;Toileting;Dressing;Reach Overhead;Bathing   ? Examination-Participation Restrictions Cleaning;Community Activity;Driving;Meal Prep;Yard Work;Laundry   ? Stability/Clinical Decision Making Stable/Uncomplicated   ? PT Treatment/Interventions ADLs/Self Care Home Management;Aquatic Therapy;Cryotherapy;Electrical Stimulation;Iontophoresis '4mg'$ /ml Dexamethasone;Moist Heat;Ultrasound;Functional mobility training;Therapeutic activities;Therapeutic exercise;Neuromuscular re-education;Patient/family education;Manual techniques;Dry needling;Taping;Passive range of motion;Vasopneumatic Device   ? PT Next Visit Plan Review exercises as needed. Gentle manual/PROM/mobs for wrist, fingers and shoulder. Initiate strengthening if able with wrist and shoulder isometrics. Continue table AAROM for shoulder.   ? PT Home Exercise Plan Access Code [email or web address]   ? Consulted and Agree with Plan of Care Patient   ? ?  ?  ? ?  ? ? ?Patient will benefit from skilled therapeutic intervention in order to improve the following deficits and impairments:  Decreased range of motion, Increased fascial restricitons, Decreased activity tolerance, Pain, Increased muscle spasms, Hypomobility, Impaired flexibility, Improper body mechanics, Decreased mobility, Decreased strength, Postural dysfunction, Impaired UE functional use, Increased edema ? ?Visit Diagnosis: ?Stiffness of left wrist, not elsewhere classified ? ?Pain in left wrist ? ?Acute pain of left shoulder ? ?Stiffness of  left shoulder, not elsewhere classified ? ?Muscle weakness (generalized) ? ?Localized edema ? ? ? ? ?Problem List ?Patient Active Problem List  ? Diagnosis Date Noted  ? Radial styloid fracture 09/17/2021  ? Contusion of left shoulder 09/17/2021  ? Lumbar foraminal stenosis  09/12/2021  ? Weight loss 07/30/2021  ? Restrictive lung disease 05/15/2021  ? S/P stroke due to cerebrovascular disease 09/17/2018  ? Primary osteoarthritis of left hip 07/08/2017  ? Primary osteoarthritis of both knees 02/25/2017  ? Lumbar spondylosis 12/03/2016  ? Internal derangement of left knee 10/09/2016  ? History of breast cancer 11/09/2012  ? Benign hematuria 07/05/2010  ? POSTMENOPAUSAL STATUS 03/29/2010  ? IMPAIRED FASTING GLUCOSE 12/11/2008  ? LEG PAIN, BILATERAL 01/20/2008  ? HIP PAIN, RIGHT 01/05/2008  ? Disorder of bone and cartilage 02/26/2007  ? Hypothyroidism 04/28/2006  ? Essential hypertension 04/28/2006  ? MITRAL VALVE DISORDER 04/28/2006  ? ? ?Massa Pe April Gordy Levan, PT, DPT ?11/21/2021, 2:47 PM ? ? ?[facility] ?[address] ?[place], [place], [address] ?Phone: [phone]   Fax:  [phone] ? ?Name: Kathleen [name] ?MRN: [id number] ?Date of Birth: 12/10/1940 ? ? ? ?

## 2021-11-21 NOTE — Telephone Encounter (Signed)
Please call the patient regarding paperwork --her employer is given her a fit  ?

## 2021-11-22 NOTE — Telephone Encounter (Signed)
I called patient. Her employer is requesting paperwork be completed again. She will bring to front desk for me. Please do not forward to [employer], but get to me.  ?Thanks. ?

## 2021-11-26 ENCOUNTER — Telehealth: Payer: Self-pay | Admitting: Family Medicine

## 2021-11-26 ENCOUNTER — Ambulatory Visit: Payer: Medicare HMO | Admitting: Physical Therapy

## 2021-11-26 DIAGNOSIS — R29898 Other symptoms and signs involving the musculoskeletal system: Secondary | ICD-10-CM | POA: Diagnosis not present

## 2021-11-26 DIAGNOSIS — M25532 Pain in left wrist: Secondary | ICD-10-CM | POA: Diagnosis not present

## 2021-11-26 DIAGNOSIS — R42 Dizziness and giddiness: Secondary | ICD-10-CM

## 2021-11-26 DIAGNOSIS — M25632 Stiffness of left wrist, not elsewhere classified: Secondary | ICD-10-CM

## 2021-11-26 DIAGNOSIS — M25612 Stiffness of left shoulder, not elsewhere classified: Secondary | ICD-10-CM | POA: Diagnosis not present

## 2021-11-26 DIAGNOSIS — R600 Localized edema: Secondary | ICD-10-CM

## 2021-11-26 DIAGNOSIS — M25512 Pain in left shoulder: Secondary | ICD-10-CM

## 2021-11-26 DIAGNOSIS — R293 Abnormal posture: Secondary | ICD-10-CM | POA: Diagnosis not present

## 2021-11-26 DIAGNOSIS — M6281 Muscle weakness (generalized): Secondary | ICD-10-CM

## 2021-11-26 NOTE — Telephone Encounter (Signed)
Patient notified

## 2021-11-26 NOTE — Therapy (Signed)
[facility] ?[place] ?[place], [place], [address] ?Phone: [phone]   Fax:  [phone] ? ?Physical Therapy Treatment ? ?Patient Details  ?Name: Kathleen [name] ?MRN: [id number] ?Date of Birth: June 27, 1941 ?Referring Provider (PT): Marybelle Killings, MD ? ? ?Encounter Date: 11/26/2021 ? ? PT End of Session - 11/26/21 1454   ? ? Visit Number 7   ? Number of Visits 12   ? Date for PT Re-Evaluation 12/12/21   ? Authorization Type [employer]   ? Authorization - Visit Number 7   ? Progress Note Due on Visit 10   ? PT Start Time 1450   ? PT Stop Time 1530   ? PT Time Calculation (min) 40 min   ? Activity Tolerance Patient tolerated treatment well   ? Behavior During Therapy [facility] for tasks assessed/performed   ? ?  ?  ? ?  ? ? ?Past Medical History:  ?Diagnosis Date  ? Cancer [facility])   ? Hx RT breast infiltrated ductal CA previously on tamoxifen and armidex   ? Herpes simplex   ? lt eye  ? Hypertension   ? Kidney stones   ? history  ? MVP (mitral valve prolapse)   ? asymptomatic  ? Personal history of chemotherapy   ? Personal history of radiation therapy   ? ? ?Past Surgical History:  ?Procedure Laterality Date  ? ABDOMINAL HYSTERECTOMY    ? partial  ? BREAST BIOPSY    ? BREAST LUMPECTOMY Right 2000  ? RT  ? CERVICAL LAMINECTOMY  1986  ? SHOULDER SURGERY    ? rotator cuff repair  ? throidectomy  1988  ? For cancer   ? ? ?There were no vitals filed for this visit. ? ? Subjective Assessment - 11/26/21 1455   ? ? Subjective Pt reports her L hand is sore but is not sure how much of that is her arthritis. 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 Pt continues to report L flank pain.   ? Pertinent History stroke 2018; cervical disc sx ~ 30 yrs ago; arthritis; scoliosis; HTN   ? How long can you sit comfortably? n/a   ? How long can you stand comfortably? n/a   ? How long can you walk comfortably? n/a   ? Diagnostic tests  anterolisthesis lumbar spine; ankle xray (-)   ? Patient Stated Goals Get stronger, use left hand and arm with less pain   ? Currently in Pain? Yes   ? Pain Score 2    ? Pain Location Shoulder   ? Pain Orientation Left   ? Pain Descriptors / Indicators Aching   ? Pain Type Acute pain   ? Pain Onset More than a month ago   ? ?  ?  ? ?  ? ? ? ? ? [facility] PT Assessment - 11/26/21 0001   ? ?  ? Assessment  ? Medical Diagnosis S52.515D (ICD-10-CM) - Closed nondisplaced fracture of styloid process of left radius with routine healing, subsequent encounter  S40.012A (ICD-10-CM) - Contusion of left shoulder, initial encounter  Fall with left shoulder impingement 09/12/2021, left radial styloid fracture healed   ? Referring Provider (PT) Marybelle Killings, MD   ? ?  ?  ? ?  ? ? ? ? ? ? ? ? ? ? ? ? ? ? ? ? [place] [facility] Adult PT Treatment/Exercise - 11/26/21 0001   ? ?  ? Therapeutic Activites   ?  Therapeutic Activities Other Therapeutic Activities   ? 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  ? Shoulder Exercises: Seated  ? Row Strengthening;Both;20 reps;Theraband   ? Theraband Level (Shoulder Row) Level 1 (Yellow)   ? External Rotation Strengthening;Left;20 reps   ? Theraband Level (Shoulder External Rotation) Level 1 (Yellow)   ?  ? Shoulder Exercises: Standing  ? Flexion AAROM;Left;10 reps   ? Other Standing Exercises scaption wall wash x10   ?  ? Shoulder Exercises: Pulleys  ? Flexion 1 minute   ? Scaption 1 minute   ?  ? Hand Exercises  ? MCPJ Extension 10 reps   ? Other Hand Exercises thumb ext and abd 2x10   ?  ? Wrist Exercises  ? Other wrist exercises yellow therabar: twisting forward and backward x10   ?  ? Manual Therapy  ? Joint Mobilization L 1st MCP and CMC grade III mob into abduction and ext   ? Soft tissue mobilization STM & TPR adductor pollicis and abductor pollicis   ? ?  ?  ? ?  ? ? ? ? ? ? ? ? ? ? ? ? ? ? ? PT Long Term Goals -  10/31/21 1254   ? ?  ? PT LONG TERM GOAL #1  ? Title Pt will be ind with HEP and progressing her exercises at home   ? Time 6   ? Period Weeks   ? Status New   ? Target Date 12/12/21   ?  ? PT LONG TERM GOAL #2  ? Title Pt will be able to elevate L shoulder >100 deg for overhead activity   ? Time 6   ? Period Weeks   ? Status New   ? Target Date 12/12/21   ?  ? PT LONG TERM GOAL #3  ? Title Pt will be able to lift and carry at least 40# per her work requirements   ? Time 6   ? Period Weeks   ? Status New   ? Target Date 12/12/21   ?  ? PT LONG TERM GOAL #4  ? Title Pt will demo L = R wrist/hand AROM for improved ability to open jars and bottles   ? Time 6   ? Period Weeks   ? Status New   ? Target Date 12/12/21   ? ?  ?  ? ?  ? ? ? ? ? ? ? ? Plan - 11/26/21 1529   ? ? Clinical Impression Statement Continued to work on thumb ext/abduction and manual work. Pt is demonstrating improving hand/wrist motion but she remains sore. Rest of session primarily worked on progressing her shoulder exercises. Fatigues easily. 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   ? Personal Factors and Comorbidities Age;Fitness;Time since onset of injury/illness/exacerbation;Profession   ? Examination-Activity Limitations Carry;Lift;Hygiene/Grooming;Toileting;Dressing;Reach Overhead;Bathing   ? Examination-Participation Restrictions Cleaning;Community Activity;Driving;Meal Prep;Yard Work;Laundry   ? Stability/Clinical Decision Making Stable/Uncomplicated   ? PT Treatment/Interventions ADLs/Self Care Home Management;Aquatic Therapy;Cryotherapy;Electrical Stimulation;Iontophoresis '4mg'$ /ml Dexamethasone;Moist Heat;Ultrasound;Functional mobility training;Therapeutic activities;Therapeutic exercise;Neuromuscular re-education;Patient/family education;Manual techniques;Dry needling;Taping;Passive range of motion;Vasopneumatic Device   ? PT Next Visit Plan Review exercises as needed. Gentle manual/PROM/mobs for wrist,  fingers and shoulder. Initiate strengthening if able with wrist and shoulder isometrics. Continue table AAROM for shoulder.   ? PT Home Exercise Plan Access Code [email or web address]   ? Consulted and Agree with  Plan of Care Patient   ? ?  ?  ? ?  ? ? ?Patient will benefit from skilled therapeutic intervention in order to improve the following deficits and impairments:  Decreased range of motion, Increased fascial restricitons, Decreased activity tolerance, Pain, Increased muscle spasms, Hypomobility, Impaired flexibility, Improper body mechanics, Decreased mobility, Decreased strength, Postural dysfunction, Impaired UE functional use, Increased edema ? ?Visit Diagnosis: ?Stiffness of left wrist, not elsewhere classified ? ?Pain in left wrist ? ?Acute pain of left shoulder ? ?Stiffness of left shoulder, not elsewhere classified ? ?Muscle weakness (generalized) ? ?Localized edema ? ? ? ? ?Problem List ?Patient Active Problem List  ? Diagnosis Date Noted  ? Radial styloid fracture 09/17/2021  ? Contusion of left shoulder 09/17/2021  ? Lumbar foraminal stenosis 09/12/2021  ? Weight loss 07/30/2021  ? Restrictive lung disease 05/15/2021  ? S/P stroke due to cerebrovascular disease 09/17/2018  ? Primary osteoarthritis of left hip 07/08/2017  ? Primary osteoarthritis of both knees 02/25/2017  ? Lumbar spondylosis 12/03/2016  ? Internal derangement of left knee 10/09/2016  ? History of breast cancer 11/09/2012  ? Benign hematuria 07/05/2010  ? POSTMENOPAUSAL STATUS 03/29/2010  ? IMPAIRED FASTING GLUCOSE 12/11/2008  ? LEG PAIN, BILATERAL 01/20/2008  ? HIP PAIN, RIGHT 01/05/2008  ? Disorder of bone and cartilage 02/26/2007  ? Hypothyroidism 04/28/2006  ? Essential hypertension 04/28/2006  ? MITRAL VALVE DISORDER 04/28/2006  ? ? ?Jama Krichbaum April Gordy Levan, PT, DPT ?11/26/2021, 4:50 PM ? ?[place] [facility] ?[place] ?[place], [place], [address] ?Phone: [phone]   Fax:   [phone] ? ?Name: Kathleen [name] ?MRN: [id number] ?Date of Birth: 1941-03-02 ? ? ? ?

## 2021-11-26 NOTE — Telephone Encounter (Signed)
Please call pt and inform her that the referral has been placed.  ?

## 2021-11-26 NOTE — Telephone Encounter (Signed)
Patient came in to office and request a referral for her VERTIGO (physical therapy). Patient would like to start therapy asap. lmr ?

## 2021-11-27 DIAGNOSIS — C44729 Squamous cell carcinoma of skin of left lower limb, including hip: Secondary | ICD-10-CM | POA: Diagnosis not present

## 2021-12-02 DIAGNOSIS — M7542 Impingement syndrome of left shoulder: Secondary | ICD-10-CM

## 2021-12-02 HISTORY — DX: Impingement syndrome of left shoulder: M75.42

## 2021-12-03 ENCOUNTER — Ambulatory Visit: Payer: Medicare HMO | Admitting: Physical Therapy

## 2021-12-04 ENCOUNTER — Encounter: Payer: Self-pay | Admitting: Physician Assistant

## 2021-12-04 ENCOUNTER — Ambulatory Visit (INDEPENDENT_AMBULATORY_CARE_PROVIDER_SITE_OTHER): Payer: Medicare HMO | Admitting: Physician Assistant

## 2021-12-04 VITALS — BP 136/67 | HR 71 | Temp 98.0°F | Ht 60.0 in | Wt 108.0 lb

## 2021-12-04 DIAGNOSIS — R5381 Other malaise: Secondary | ICD-10-CM

## 2021-12-04 DIAGNOSIS — R109 Unspecified abdominal pain: Secondary | ICD-10-CM | POA: Insufficient documentation

## 2021-12-04 DIAGNOSIS — R634 Abnormal weight loss: Secondary | ICD-10-CM | POA: Diagnosis not present

## 2021-12-04 DIAGNOSIS — R310 Gross hematuria: Secondary | ICD-10-CM | POA: Diagnosis not present

## 2021-12-04 DIAGNOSIS — R319 Hematuria, unspecified: Secondary | ICD-10-CM | POA: Diagnosis not present

## 2021-12-04 DIAGNOSIS — R5383 Other fatigue: Secondary | ICD-10-CM

## 2021-12-04 DIAGNOSIS — R1030 Lower abdominal pain, unspecified: Secondary | ICD-10-CM

## 2021-12-04 DIAGNOSIS — M545 Low back pain, unspecified: Secondary | ICD-10-CM | POA: Diagnosis not present

## 2021-12-04 HISTORY — DX: Gross hematuria: R31.0

## 2021-12-04 LAB — POCT URINALYSIS DIP (CLINITEK)
Bilirubin, UA: NEGATIVE
Glucose, UA: NEGATIVE mg/dL
Ketones, POC UA: NEGATIVE mg/dL
Leukocytes, UA: NEGATIVE
Nitrite, UA: NEGATIVE
Spec Grav, UA: 1.02 (ref 1.010–1.025)
Urobilinogen, UA: 0.2 E.U./dL
pH, UA: 5.5 (ref 5.0–8.0)

## 2021-12-04 MED ORDER — CEFTRIAXONE SODIUM 1 G IJ SOLR
1.0000 g | Freq: Once | INTRAMUSCULAR | Status: AC
  Administered 2021-12-04: 1 g via INTRAMUSCULAR

## 2021-12-04 NOTE — Patient Instructions (Addendum)
Will give IM shot of rocephin.  ?Will get labs today.  ?Will send urine off for culture and microscopic.  ?If continues to be no infection need to consider CT of pelvis to look at bladder ?

## 2021-12-04 NOTE — Progress Notes (Signed)
Will you also add microscopic.

## 2021-12-04 NOTE — Progress Notes (Signed)
? ?Acute Office Visit ? ?Subjective:  ? ?  ?Patient ID: Kathleen [name], female    DOB: 10-02-1940, 81 y.o.   MRN: [id number] ? ?Chief Complaint  ?Patient presents with  ? Hematuria  ? ? ?HPI ?Patient is in today for evaluation of gross blood in urine for 1 week. She has had intermittent microscopic hematuria for years but not like this. She is also having lower abdominal pain worse on left and radiating into left low back. 4/23 she was having dysuria but urine culture showed no growth. She denies any fever, chills. She does feel exteremly tired and has had weight loss that has been concerning for her. She has no appetite and nauseated but no vomiting. No history of smoking. She feels completely "worn down". She is used to being active but has no motivation to do anything.  ?.. ?Active Ambulatory Problems  ?  Diagnosis Date Noted  ? Hypothyroidism 04/28/2006  ? Essential hypertension 04/28/2006  ? MITRAL VALVE DISORDER 04/28/2006  ? HIP PAIN, RIGHT 01/05/2008  ? LEG PAIN, BILATERAL 01/20/2008  ? Disorder of bone and cartilage 02/26/2007  ? IMPAIRED FASTING GLUCOSE 12/11/2008  ? POSTMENOPAUSAL STATUS 03/29/2010  ? Benign hematuria 07/05/2010  ? History of breast cancer 11/09/2012  ? Lumbar spondylosis 12/03/2016  ? Primary osteoarthritis of both knees 02/25/2017  ? Primary osteoarthritis of left hip 07/08/2017  ? Internal derangement of left knee 10/09/2016  ? S/P stroke due to cerebrovascular disease 09/17/2018  ? Restrictive lung disease 05/15/2021  ? Weight loss 07/30/2021  ? Lumbar foraminal stenosis 09/12/2021  ? Radial styloid fracture 09/17/2021  ? Contusion of left shoulder 09/17/2021  ? Impingement syndrome of left shoulder 12/02/2021  ? Gross hematuria 12/04/2021  ? Left flank pain 12/04/2021  ? ?Resolved Ambulatory Problems  ?  Diagnosis Date Noted  ? SHOULDER PAIN, LEFT 01/05/2008  ? Piriformis syndrome of left side 08/15/2010  ? Plantar fasciitis, right 05/20/2013  ? Right lumbar radiculitis 05/20/2013  ?  Concussion with no loss of consciousness 05/24/2013  ? Abdominal pain, left lower quadrant 05/26/2013  ? Cerumen impaction 05/31/2013  ? Corn of foot 11/26/2016  ? ?Past Medical History:  ?Diagnosis Date  ? Cancer [facility])   ? Herpes simplex   ? Hypertension   ? Kidney stones   ? MVP (mitral valve prolapse)   ? Personal history of chemotherapy   ? Personal history of radiation therapy   ? ? ? ?ROS ? ?See HPI.  ?   ?Objective:  ?  ?BP 136/67   Pulse 71   Temp 98 ?F (36.7 ?C) (Oral)   Ht 5' (1.524 m)   Wt 108 lb (49 kg)   SpO2 100%   BMI 21.09 kg/m?  ?BP Readings from Last 3 Encounters:  ?12/04/21 136/67  ?11/19/21 137/80  ?11/05/21 (!) 157/92  ? ?Wt Readings from Last 3 Encounters:  ?12/04/21 108 lb (49 kg)  ?11/19/21 108 lb (49 kg)  ?11/05/21 108 lb (49 kg)  ? ? .. ?Results for orders placed or performed in visit on 12/04/21  ?POCT URINALYSIS DIP (CLINITEK)  ?Result Value Ref Range  ? Color, UA yellow yellow  ? Clarity, UA clear clear  ? Glucose, UA negative negative mg/dL  ? Bilirubin, UA negative negative  ? Ketones, POC UA negative negative mg/dL  ? Spec Grav, UA 1.020 1.010 - 1.025  ? Blood, UA large (A) negative  ? pH, UA 5.5 5.0 - 8.0  ? POC PROTEIN,UA trace negative, trace  ?  Urobilinogen, UA 0.2 0.2 or 1.0 E.U./dL  ? Nitrite, UA Negative Negative  ? Leukocytes, UA Negative Negative  ? ? ? ?Physical Exam ?Vitals reviewed.  ?Constitutional:   ?   Appearance: Normal appearance.  ?HENT:  ?   Head: Normocephalic.  ?Cardiovascular:  ?   Rate and Rhythm: Normal rate and regular rhythm.  ?   Pulses: Normal pulses.  ?Pulmonary:  ?   Effort: Pulmonary effort is normal.  ?Abdominal:  ?   General: Bowel sounds are normal. There is no distension.  ?   Palpations: Abdomen is soft. There is no mass.  ?   Tenderness: There is abdominal tenderness. There is no right CVA tenderness, left CVA tenderness, guarding or rebound.  ?   Hernia: No hernia is present.  ?   Comments: More tenderness over the suprapubic area to  palpation.   ?Musculoskeletal:  ?   Right lower leg: No edema.  ?   Left lower leg: No edema.  ?Skin: ?   Coloration: Skin is pale.  ?Neurological:  ?   Mental Status: She is alert and oriented to person, place, and time.  ?Psychiatric:     ?   Mood and Affect: Mood normal.  ? ? ? ?   ?Assessment & Plan:  ?..Kathleen [name] was seen today for hematuria. ? ?Diagnoses and all orders for this visit: ? ?Gross hematuria ?-     CBC with Differential/Platelet ?-     COMPLETE METABOLIC PANEL WITH GFR ?-     cefTRIAXone (ROCEPHIN) injection 1 g ?-     Urine Microscopic ? ?Hematuria, unspecified type ?-     POCT URINALYSIS DIP (CLINITEK) ?-     Urine Culture ?-     cefTRIAXone (ROCEPHIN) injection 1 g ?-     Urine Microscopic ? ?Acute bilateral low back pain without sciatica ?-     POCT URINALYSIS DIP (CLINITEK) ?-     Urine Culture ?-     cefTRIAXone (ROCEPHIN) injection 1 g ?-     Urine Microscopic ? ?Lower abdominal pain ?-     POCT URINALYSIS DIP (CLINITEK) ?-     Urine Culture ?-     CBC with Differential/Platelet ?-     COMPLETE METABOLIC PANEL WITH GFR ?-     cefTRIAXone (ROCEPHIN) injection 1 g ?-     Urine Microscopic ? ?Left flank pain ?-     CBC with Differential/Platelet ?-     COMPLETE METABOLIC PANEL WITH GFR ?-     cefTRIAXone (ROCEPHIN) injection 1 g ?-     Urine Microscopic ? ?Malaise and fatigue ?-     CBC with Differential/Platelet ?-     COMPLETE METABOLIC PANEL WITH GFR ? ?Weight loss ?-     CBC with Differential/Platelet ?-     COMPLETE METABOLIC PANEL WITH GFR ? ? ? ?Keep appt with Dr. Madilyn Fireman tomorrow ? ?Weight has been stable over the last month ?UA shows large blood ?Culture and microscopic ordered ?1 gram of rocephin given today  ?CBC and CMP ordered today ?Will wait on any oral abx until cultures come back ?No infection on last urinalysis in April 2023 ?With malaise/weight loss/blood in urine concerned about bladder cancer ?Suggest a CT of abdomen/pelvis if no cause of bleeding found ? ? ?Iran Planas,  PA-C ? ? ?

## 2021-12-05 ENCOUNTER — Ambulatory Visit (INDEPENDENT_AMBULATORY_CARE_PROVIDER_SITE_OTHER): Payer: Medicare HMO | Admitting: Family Medicine

## 2021-12-05 ENCOUNTER — Encounter: Payer: Self-pay | Admitting: Family Medicine

## 2021-12-05 VITALS — BP 144/64 | HR 71 | Resp 16 | Ht 60.0 in | Wt 109.0 lb

## 2021-12-05 DIAGNOSIS — J358 Other chronic diseases of tonsils and adenoids: Secondary | ICD-10-CM

## 2021-12-05 DIAGNOSIS — H6123 Impacted cerumen, bilateral: Secondary | ICD-10-CM

## 2021-12-05 DIAGNOSIS — R310 Gross hematuria: Secondary | ICD-10-CM

## 2021-12-05 DIAGNOSIS — R5383 Other fatigue: Secondary | ICD-10-CM | POA: Diagnosis not present

## 2021-12-05 DIAGNOSIS — H938X3 Other specified disorders of ear, bilateral: Secondary | ICD-10-CM | POA: Diagnosis not present

## 2021-12-05 DIAGNOSIS — R1030 Lower abdominal pain, unspecified: Secondary | ICD-10-CM

## 2021-12-05 LAB — COMPLETE METABOLIC PANEL WITH GFR
AG Ratio: 1.9 (calc) (ref 1.0–2.5)
ALT: 17 U/L (ref 6–29)
AST: 22 U/L (ref 10–35)
Albumin: 4.2 g/dL (ref 3.6–5.1)
Alkaline phosphatase (APISO): 72 U/L (ref 37–153)
BUN: 25 mg/dL (ref 7–25)
CO2: 24 mmol/L (ref 20–32)
Calcium: 9.2 mg/dL (ref 8.6–10.4)
Chloride: 106 mmol/L (ref 98–110)
Creat: 0.78 mg/dL (ref 0.60–0.95)
Globulin: 2.2 g/dL (calc) (ref 1.9–3.7)
Glucose, Bld: 87 mg/dL (ref 65–99)
Potassium: 4.1 mmol/L (ref 3.5–5.3)
Sodium: 141 mmol/L (ref 135–146)
Total Bilirubin: 0.8 mg/dL (ref 0.2–1.2)
Total Protein: 6.4 g/dL (ref 6.1–8.1)
eGFR: 76 mL/min/{1.73_m2} (ref >=60)

## 2021-12-05 LAB — CBC WITH DIFFERENTIAL/PLATELET
Absolute Monocytes: 697 cells/uL (ref 200–950)
Basophils Absolute: 32 cells/uL (ref 0–200)
Basophils Relative: 0.4 %
Eosinophils Absolute: 81 cells/uL (ref 15–500)
Eosinophils Relative: 1 %
HCT: 40.8 % (ref 35.0–45.0)
Hemoglobin: 13.9 g/dL (ref 11.7–15.5)
Lymphs Abs: 1960 cells/uL (ref 850–3900)
MCH: 31.7 pg (ref 27.0–33.0)
MCHC: 34.1 g/dL (ref 32.0–36.0)
MCV: 93.2 fL (ref 80.0–100.0)
MPV: 12.3 fL (ref 7.5–12.5)
Monocytes Relative: 8.6 %
Neutro Abs: 5330 cells/uL (ref 1500–7800)
Neutrophils Relative %: 65.8 %
Platelets: 166 10*3/uL (ref 140–400)
RBC: 4.38 10*6/uL (ref 3.80–5.10)
RDW: 12.2 % (ref 11.0–15.0)
Total Lymphocyte: 24.2 %
WBC: 8.1 10*3/uL (ref 3.8–10.8)

## 2021-12-05 MED ORDER — CEFTRIAXONE SODIUM 1 G IJ SOLR
1.0000 g | Freq: Once | INTRAMUSCULAR | Status: AC
  Administered 2021-12-05: 1 g via INTRAMUSCULAR

## 2021-12-05 NOTE — Progress Notes (Signed)
You will see this patient today. I saw her yesterday with gross hematuria and just not feeling well. Pending urine culture. Concerned about bladder carcinoma with her malaise/weight loss/hematuria.

## 2021-12-05 NOTE — Progress Notes (Addendum)
Acute Office Visit  Subjective:     Patient ID: Kathleen [name], female    DOB: 11-Sep-1940, 81 y.o.   MRN: [id number]  Chief Complaint  Patient presents with   Follow-up    1 month     HPI Patient is in today for fatigue and gross hematuria for at least a week and a half.  From visit yesterday with Iran Planas, PA: "Patient is in today for evaluation of gross blood in urine for 1 week. She has had intermittent microscopic hematuria for years but not like this. She is also having lower abdominal pain worse on left and radiating into left low back. 4/23 she was having dysuria but urine culture showed no growth. She denies any fever, chills. She does feel exteremly tired and has had weight loss that has been concerning for her. She has no appetite and nauseated but no vomiting. No history of smoking. She feels completely "worn down". She is used to being active but has no motivation to do anything."  Urine microalbumin and culture both sent. No results yet   She is still seeing blood in her urine.  No pain. She has been having weight loss. She is having lower abd pain but no dysuria.    Still struggling with vertigo.  Especially in the mornings when she first gets up.  But she is also still having some balance issues.  Reports that her ears feel like they are stopped up.  She is not necessarily having cracking or popping but just feels like her hearing goes in and out a little bit.  Is also been dealing with some left low back pain just above the hip area that has been on and off.  She says at times it becomes so painful she is a most nauseated.  She was actually seeing the orthopedist earlier this year for back pain but it was in a different location.  In fact she was actually scheduled for injections and that is around the time she fell and so she never made it.  But again she says that pain is actually better now just over this left lower area.  ROS      Objective:    BP (!) 144/64    Pulse 71   Resp 16   Ht 5' (1.524 m)   Wt 109 lb (49.4 kg)   SpO2 100%   BMI 21.29 kg/m    Physical Exam Constitutional:      Appearance: She is well-developed.  HENT:     Head: Normocephalic and atraumatic.     Comments: Tonsillar stones.     Right Ear: Ear canal and external ear normal.     Left Ear: Ear canal and external ear normal.     Ears:     Comments: Both canals are narrow and TMs are blocked by cerumen bilaterally.    Nose: Nose normal.  Eyes:     Conjunctiva/sclera: Conjunctivae normal.     Pupils: Pupils are equal, round, and reactive to light.  Neck:     Thyroid: No thyromegaly.  Cardiovascular:     Rate and Rhythm: Normal rate and regular rhythm.     Heart sounds: Normal heart sounds.  Pulmonary:     Effort: Pulmonary effort is normal.     Breath sounds: Normal breath sounds. No wheezing.  Musculoskeletal:     Cervical back: Neck supple.  Lymphadenopathy:     Cervical: No cervical adenopathy.  Skin:    General:  Skin is warm and dry.  Neurological:     Mental Status: She is alert and oriented to person, place, and time.    No results found for any visits on 12/05/21.      Assessment & Plan:   Problem List Items Addressed This Visit       Genitourinary   Gross hematuria   Relevant Orders   Ambulatory referral to Urology   CT ABDOMEN PELVIS W WO CONTRAST   Other Visit Diagnoses     Other fatigue    -  Primary   Relevant Orders   Ambulatory referral to Urology   CT ABDOMEN PELVIS W WO CONTRAST   Ear fullness, bilateral       Tonsil stone       Lower abdominal pain       Bilateral impacted cerumen           Gross hematuria-was having her microscopic review would be back today but is not back yet.  We will call with those results once available she is still having persistent gross hematuria so Georgina Peer go ahead and move forward with CT abdomen pelvis with and without contrast for further work-up.  I am also going to place referral to  urology she may end up needing a cystoscopy.  As we discussed that we are still waiting for the urine culture which usually takes 3 days to come back we discussed that we could give another injection of Rocephin to cover for kidney infection.  She says she does feel little less tired today so maybe it is helping.  Tonsillar stones -she says they are annoying.  We discussed salt water gargles using a Waterpik if needed avoiding picking or traumatizing the area.  We discussed that there really is not any further treatment that could be offered but I am always happy to refer her to ENT if she would like.  Cerumen impaction-she is getting some fullness in her ear that is affecting her hearing.  Irrigation performed today.  Vertigo-we have added that onto her treatment regimen down the hall.  She was feeling so poorly earlier this week she was unable to make her appointment.  Left low back pain-again we will get CT of the abdomen and pelvis.  Just to rule out anything internal.  That we did discuss that it does not do a great job of looking at her spine.  So if it continues and the CT is reassuring then she may need to follow back up with her orthopedist.   Ceruminosis is noted. Indication: Cerumen impaction of the ear(s) Medical necessity statement: On physical examination, cerumen impairs clinically significant portions of the external auditory canal, and tympanic membrane. Noted obstructive, copious cerumen that cannot be removed without magnification and instrumentation Consent: Discussed benefits and risks of procedure and verbal consent obtained Procedure: Patient was prepped for the procedure. Utilized an otoscope to assess and take note of the ear canal, the tympanic membrane, and the presence, amount, and placement of the cerumen. Gentle water irrigation and soft plastic curette was utilized to remove cerumen.  Post procedure examination: shows cerumen was completely removed. Patient tolerated  procedure well. The patient is made aware that they may experience temporary vertigo, temporary hearing loss, and temporary discomfort. If these symptom last for more than 24 hours to call the clinic or proceed to the ED.     Meds ordered this encounter  Medications   cefTRIAXone (ROCEPHIN) injection 1 g    Order  Specific Question:   Antibiotic Indication:    Answer:   UTI    Return in about 2 weeks (around 12/19/2021) for Mood.  Beatrice Lecher, MD

## 2021-12-06 LAB — URINALYSIS, MICROSCOPIC ONLY
Bacteria, UA: NONE SEEN /HPF
Squamous Epithelial / HPF: NONE SEEN /HPF (ref <=5)
WBC, UA: NONE SEEN /HPF (ref 0–5)

## 2021-12-06 LAB — URINE CULTURE
MICRO NUMBER:: 13414563
Result:: NO GROWTH
SPECIMEN QUALITY:: ADEQUATE

## 2021-12-09 ENCOUNTER — Ambulatory Visit (INDEPENDENT_AMBULATORY_CARE_PROVIDER_SITE_OTHER): Payer: Medicare HMO

## 2021-12-09 DIAGNOSIS — R319 Hematuria, unspecified: Secondary | ICD-10-CM | POA: Diagnosis not present

## 2021-12-09 DIAGNOSIS — N281 Cyst of kidney, acquired: Secondary | ICD-10-CM | POA: Diagnosis not present

## 2021-12-09 DIAGNOSIS — R5383 Other fatigue: Secondary | ICD-10-CM | POA: Diagnosis not present

## 2021-12-09 DIAGNOSIS — I700 Atherosclerosis of aorta: Secondary | ICD-10-CM | POA: Diagnosis not present

## 2021-12-09 DIAGNOSIS — K5730 Diverticulosis of large intestine without perforation or abscess without bleeding: Secondary | ICD-10-CM | POA: Diagnosis not present

## 2021-12-09 DIAGNOSIS — R310 Gross hematuria: Secondary | ICD-10-CM

## 2021-12-09 MED ORDER — IOHEXOL 300 MG/ML  SOLN
100.0000 mL | Freq: Once | INTRAMUSCULAR | Status: AC | PRN
  Administered 2021-12-09: 100 mL via INTRAVENOUS

## 2021-12-09 NOTE — Progress Notes (Signed)
No growth on urine culture to indicate bacterial infection.

## 2021-12-09 NOTE — Progress Notes (Signed)
Call pt: Imaging shows no stones or excess fluid on the kidneys. No mass on kidneys.  There is a lot of stool showing constipation. Recommend  a laxative like miralax daily until soft stool. Next step would be Urology referral. We have already placed the referral.   They also saw just a little fluid around the heart so recommended we get your echo scan updated.  If you are ok with that we can order.

## 2021-12-10 ENCOUNTER — Ambulatory Visit: Payer: Medicare HMO | Admitting: Physical Therapy

## 2021-12-10 DIAGNOSIS — M25532 Pain in left wrist: Secondary | ICD-10-CM | POA: Diagnosis not present

## 2021-12-10 DIAGNOSIS — R293 Abnormal posture: Secondary | ICD-10-CM

## 2021-12-10 DIAGNOSIS — M25632 Stiffness of left wrist, not elsewhere classified: Secondary | ICD-10-CM

## 2021-12-10 DIAGNOSIS — R600 Localized edema: Secondary | ICD-10-CM

## 2021-12-10 DIAGNOSIS — M6281 Muscle weakness (generalized): Secondary | ICD-10-CM

## 2021-12-10 DIAGNOSIS — R29898 Other symptoms and signs involving the musculoskeletal system: Secondary | ICD-10-CM

## 2021-12-10 DIAGNOSIS — M25512 Pain in left shoulder: Secondary | ICD-10-CM | POA: Diagnosis not present

## 2021-12-10 DIAGNOSIS — M25612 Stiffness of left shoulder, not elsewhere classified: Secondary | ICD-10-CM | POA: Diagnosis not present

## 2021-12-10 NOTE — Therapy (Signed)
[place], [place], [address] Phone: [phone]   Fax:  [phone]  Physical Therapy Treatment  Patient Details  Name: Kathleen [name] MRN: [id number] Date of Birth: 1941-01-21 Referring Provider (PT): Marybelle Killings, MD   Encounter Date: 12/10/2021   PT End of Session - 12/10/21 1527     Visit Number 8    Number of Visits 12    Date for PT Re-Evaluation 12/12/21    Authorization Type [employer]    Authorization - Visit Number 8    Progress Note Due on Visit 10    PT Start Time 1400    PT Stop Time 8786    PT Time Calculation (min) 45 min    Activity Tolerance Patient tolerated treatment well    Behavior During Therapy [facility] for tasks assessed/performed             Past Medical History:  Diagnosis Date   Cancer ([place])    Hx RT breast infiltrated ductal CA previously on tamoxifen and armidex    Herpes simplex    lt eye   Hypertension    Kidney stones    history   MVP (mitral valve prolapse)    asymptomatic   Personal history of chemotherapy    Personal history of radiation therapy     Past Surgical History:  Procedure Laterality Date   ABDOMINAL HYSTERECTOMY     partial   BREAST BIOPSY     BREAST LUMPECTOMY Right 2000   RT   CERVICAL LAMINECTOMY  1986   SHOULDER SURGERY     rotator cuff repair   throidectomy  1988   For cancer     There were no vitals filed for this visit.   Subjective Assessment - 12/10/21 1528     Subjective Pt reports L hand continues to remain sore with continued use. Pt states she has been feeling too fatigued to work on all of her exercises -- particularly her shoulder exercises.    Pertinent History stroke 2018; cervical disc sx ~ 30 yrs ago; arthritis; scoliosis; HTN    How long can you sit comfortably? n/a    How long can you stand comfortably? n/a    How long can you walk comfortably? n/a    Diagnostic tests anterolisthesis lumbar spine;  ankle xray (-)    Patient Stated Goals Get stronger, use left hand and arm with less pain    Pain Onset More than a month ago                [facility] PT Assessment - 12/10/21 0001       Assessment   Medical Diagnosis S52.515D (ICD-10-CM) - Closed nondisplaced fracture of styloid process of left radius with routine healing, subsequent encounter  S40.012A (ICD-10-CM) - Contusion of left shoulder, initial encounter  Fall with left shoulder impingement 09/12/2021, left radial styloid fracture healed    Referring Provider (PT) Marybelle Killings, MD                           [facility] Adult PT Treatment/Exercise - 12/10/21 0001       Shoulder Exercises: Standing   Flexion AAROM;Left;10 reps    ABduction AAROM;Left;10 reps   scaption     Hand Exercises   MCPJ Flexion AROM;Left;10 reps    MCPJ Extension 10 reps    MCPJ Extension Limitations x2 sets    Opposition AROM;10 reps  Other Hand Exercises soft handmaster extensions 2x10      Wrist Exercises   Other wrist exercises yellow therabar: twisting forward and backward x10      Modalities   Modalities Ultrasound      Ultrasound   Ultrasound Location abductor/adductor pollicis    Ultrasound Parameters 3.3 mHz, 100% duty    Ultrasound Goals Pain      Manual Therapy   Joint Mobilization L 1st MCP and CMC grade III mob into abduction and ext    Soft tissue mobilization STM & TPR adductor pollicis and abductor pollicis                          PT Long Term Goals - 12/10/21 1526       PT LONG TERM GOAL #1   Title Pt will be ind with HEP and progressing her exercises at home    Time 6    Period Weeks    Status On-going    Target Date 12/12/21      PT LONG TERM GOAL #2   Title Pt will be able to elevate L shoulder >100 deg for overhead activity    Baseline Flexion ~90 deg, abduction unable to actively move without pain and increased guarding    Time 6    Period Weeks    Status Partially Met     Target Date 12/12/21      PT LONG TERM GOAL #3   Title Pt will be able to lift and carry at least 40# per her work requirements    Baseline continues to guard    Time 6    Period Weeks    Status Not Met    Target Date 12/12/21      PT LONG TERM GOAL #4   Title Pt will demo L = R wrist/hand AROM for improved ability to open jars and bottles    Baseline L=R wrist AROM; L thumb remains limited in abduction more than R    Time 6    Period Weeks    Status On-going    Target Date 12/12/21                   Plan - 12/10/21 1523     Clinical Impression Statement Trial of [place] for pt's continued adductor pollicis pain and continued thumb stiffness with fair results (pt reports less pain with thumb movement). Continued to work on improving on finger extension and abduction. Pt has not been as compliant with performing her shoulder AAROM. Continued pain with any active abduction, less pain with shoulder flexion but continues to fatigue easily. Pt tender to palpation along supraspinatus. Pt would benefit from continued PT to work on her L UE impairments. Pt deferred vestibular assessment and treatment today -- would like to be assessed and checked for this on next session.    Personal Factors and Comorbidities Age;Fitness;Time since onset of injury/illness/exacerbation;Profession    Examination-Activity Limitations Carry;Lift;Hygiene/Grooming;Toileting;Dressing;Reach Overhead;Bathing    Examination-Participation Restrictions Cleaning;Community Activity;Driving;Meal Prep;Dorita Sciara    Stability/Clinical Decision Making Stable/Uncomplicated    PT Treatment/Interventions ADLs/Self Care Home Management;Aquatic Therapy;Cryotherapy;Electrical Stimulation;Iontophoresis 52m/ml Dexamethasone;Moist Heat;Ultrasound;Functional mobility training;Therapeutic activities;Therapeutic exercise;Neuromuscular re-education;Patient/family education;Manual techniques;Dry needling;Taping;Passive range of  motion;Vasopneumatic Device    PT Next Visit Plan Review exercises as needed. Gentle manual/PROM/mobs for wrist, fingers and shoulder. Initiate strengthening if able with wrist and shoulder isometrics. Continue table/wall AAROM for shoulder.    PT Home Exercise Plan Access Code [email or web address]  Consulted and Agree with Plan of Care Patient             Patient will benefit from skilled therapeutic intervention in order to improve the following deficits and impairments:  Decreased range of motion, Increased fascial restricitons, Decreased activity tolerance, Pain, Increased muscle spasms, Hypomobility, Impaired flexibility, Improper body mechanics, Decreased mobility, Decreased strength, Postural dysfunction, Impaired UE functional use, Increased edema  Visit Diagnosis: No diagnosis found.     Problem List Patient Active Problem List   Diagnosis Date Noted   Gross hematuria 12/04/2021   Left flank pain 12/04/2021   Impingement syndrome of left shoulder 12/02/2021   Radial styloid fracture 09/17/2021   Contusion of left shoulder 09/17/2021   Lumbar foraminal stenosis 09/12/2021   Weight loss 07/30/2021   Restrictive lung disease 05/15/2021   S/P stroke due to cerebrovascular disease 09/17/2018   Primary osteoarthritis of left hip 07/08/2017   Primary osteoarthritis of both knees 02/25/2017   Lumbar spondylosis 12/03/2016   Internal derangement of left knee 10/09/2016   History of breast cancer 11/09/2012   Benign hematuria 07/05/2010   POSTMENOPAUSAL STATUS 03/29/2010   IMPAIRED FASTING GLUCOSE 12/11/2008   LEG PAIN, BILATERAL 01/20/2008   HIP PAIN, RIGHT 01/05/2008   Disorder of bone and cartilage 02/26/2007   Hypothyroidism 04/28/2006   Essential hypertension 04/28/2006   MITRAL VALVE DISORDER 04/28/2006    [facility] April Beatrix Shipper [place], [place] 12/10/2021, 3:30 PM  [facility] [place] [address] [place], [place], [address] Phone:  [phone]   Fax:  [phone]  Name: Heddy Vidana MRN: [id number] Date of Birth: 1941-05-16

## 2021-12-10 NOTE — Progress Notes (Signed)
Perfect! Ok to place referral

## 2021-12-11 ENCOUNTER — Ambulatory Visit: Payer: Medicare HMO | Admitting: Orthopaedic Surgery

## 2021-12-11 ENCOUNTER — Telehealth: Payer: Self-pay | Admitting: Radiology

## 2021-12-11 ENCOUNTER — Other Ambulatory Visit: Payer: Self-pay | Admitting: Neurology

## 2021-12-11 ENCOUNTER — Encounter: Payer: Self-pay | Admitting: Orthopaedic Surgery

## 2021-12-11 VITALS — BP 134/71 | Ht 60.0 in | Wt 109.0 lb

## 2021-12-11 DIAGNOSIS — M7542 Impingement syndrome of left shoulder: Secondary | ICD-10-CM | POA: Diagnosis not present

## 2021-12-11 DIAGNOSIS — S52515D Nondisplaced fracture of left radial styloid process, subsequent encounter for closed fracture with routine healing: Secondary | ICD-10-CM

## 2021-12-11 DIAGNOSIS — R9389 Abnormal findings on diagnostic imaging of other specified body structures: Secondary | ICD-10-CM

## 2021-12-11 DIAGNOSIS — Q249 Congenital malformation of heart, unspecified: Secondary | ICD-10-CM

## 2021-12-11 NOTE — Progress Notes (Unsigned)
Office Visit Note   Patient: Kathleen [name]           Date of Birth: Mar 06, 1941           MRN: [id number] Visit Date: 12/11/2021              Requested by: Hali Marry, [place],  [place] [address] PCP: Hali Marry, MD   Assessment & Plan: Visit Diagnoses:  1. Closed nondisplaced fracture of styloid process of left radius with routine healing, subsequent encounter   2. Impingement syndrome of left shoulder     Plan: Work slip given no work times  4 weeks.  MRI scan left shoulder indicated failure to progress with physical therapy with persistent supraspinatus weakness and x-ray showing high riding head consistent with supraspinatus tear.  Office follow-up after MRI scan.  She remains out of work.  Follow-Up Instructions: No follow-ups on file.   Orders:  No orders of the defined types were placed in this encounter.  No orders of the defined types were placed in this encounter.     Procedures: No procedures performed   Clinical Data: No additional findings.   Subjective: Chief Complaint  Patient presents with   Left Shoulder - Follow-up    Fall 09/12/2021   Left Wrist - Follow-up    Fall 09/12/2021    HPI 81 year old female returns post left wrist fracture fall date 09/12/2021.  She has had ongoing problems with her shoulder with the weakness with abduction positive drop arm test and had had previous rotator cuff surgery on her opposite right shoulder by Dr.  Daylene Katayama many years ago.  She did about 3 months of therapy then right shoulder is doing better.  Therapist is noted persistent weakness with abduction and as I discussed with her when she had previous x-ray of her left shoulder she has slight high riding humeral head consistent with likely supraspinatus tear.  Patient states she has problems getting dressed opening doors due to her left shoulder weakness and pain.  Patient remains out of work.  Review of Systems  updated unchanged.   Objective: Vital Signs: BP 134/71   Ht 5' (1.524 m)   Wt 109 lb (49.4 kg)   BMI 21.29 kg/m   Physical Exam Constitutional:      Appearance: She is well-developed.  HENT:     Head: Normocephalic.     Right Ear: External ear normal.     Left Ear: External ear normal. There is no impacted cerumen.  Eyes:     Pupils: Pupils are equal, round, and reactive to light.  Neck:     Thyroid: No thyromegaly.     Trachea: No tracheal deviation.  Cardiovascular:     Rate and Rhythm: Normal rate.  Pulmonary:     Effort: Pulmonary effort is normal.  Abdominal:     Palpations: Abdomen is soft.  Musculoskeletal:     Cervical back: No rigidity.  Skin:    General: Skin is warm and dry.  Neurological:     Mental Status: She is alert and oriented to person, place, and time.  Psychiatric:        Behavior: Behavior normal.    Ortho Exam good cervical range of motion positive drop arm test on the left negative on the right positive impingement left shoulder.  Subscap external rotation is strong.  Fingertips lack passively a few millimeters touching distal palmar crease.  No triggering of the digits.  Opposite right hand shows well-healed scar from trigger finger release.  Specialty Comments:  No specialty comments available.  Imaging: No results found.   PMFS History: Patient Active Problem List   Diagnosis Date Noted   Gross hematuria 12/04/2021   Left flank pain 12/04/2021   Impingement syndrome of left shoulder 12/02/2021   Radial styloid fracture 09/17/2021   Contusion of left shoulder 09/17/2021   Lumbar foraminal stenosis 09/12/2021   Weight loss 07/30/2021   Restrictive lung disease 05/15/2021   S/P stroke due to cerebrovascular disease 09/17/2018   Primary osteoarthritis of left hip 07/08/2017   Primary osteoarthritis of both knees 02/25/2017   Lumbar spondylosis 12/03/2016   Internal derangement of left knee 10/09/2016   History of breast cancer  11/09/2012   Benign hematuria 07/05/2010   POSTMENOPAUSAL STATUS 03/29/2010   IMPAIRED FASTING GLUCOSE 12/11/2008   LEG PAIN, BILATERAL 01/20/2008   HIP PAIN, RIGHT 01/05/2008   Disorder of bone and cartilage 02/26/2007   Hypothyroidism 04/28/2006   Essential hypertension 04/28/2006   MITRAL VALVE DISORDER 04/28/2006   Past Medical History:  Diagnosis Date   Cancer ([place])    Hx RT breast infiltrated ductal CA previously on tamoxifen and armidex    Herpes simplex    lt eye   Hypertension    Kidney stones    history   MVP (mitral valve prolapse)    asymptomatic   Personal history of chemotherapy    Personal history of radiation therapy     Family History  Problem Relation Age of Onset   Breast cancer Mother     Past Surgical History:  Procedure Laterality Date   ABDOMINAL HYSTERECTOMY     partial   BREAST BIOPSY     BREAST LUMPECTOMY Right 2000   RT   CERVICAL LAMINECTOMY  1986   SHOULDER SURGERY     rotator cuff repair   throidectomy  1988   For cancer    Social History   Occupational History   Occupation: Working full-time at [employer].  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

## 2021-12-11 NOTE — Telephone Encounter (Signed)
New note emailed to [email or web address] and [email or web address] per patient request. 

## 2021-12-12 ENCOUNTER — Encounter: Payer: Self-pay | Admitting: Family Medicine

## 2021-12-12 ENCOUNTER — Telehealth: Payer: Self-pay | Admitting: Family Medicine

## 2021-12-12 ENCOUNTER — Ambulatory Visit: Payer: Medicare HMO | Admitting: Physical Therapy

## 2021-12-12 DIAGNOSIS — M25532 Pain in left wrist: Secondary | ICD-10-CM

## 2021-12-12 DIAGNOSIS — M6281 Muscle weakness (generalized): Secondary | ICD-10-CM | POA: Diagnosis not present

## 2021-12-12 DIAGNOSIS — R29898 Other symptoms and signs involving the musculoskeletal system: Secondary | ICD-10-CM | POA: Diagnosis not present

## 2021-12-12 DIAGNOSIS — R293 Abnormal posture: Secondary | ICD-10-CM | POA: Diagnosis not present

## 2021-12-12 DIAGNOSIS — M25612 Stiffness of left shoulder, not elsewhere classified: Secondary | ICD-10-CM

## 2021-12-12 DIAGNOSIS — M25632 Stiffness of left wrist, not elsewhere classified: Secondary | ICD-10-CM | POA: Diagnosis not present

## 2021-12-12 DIAGNOSIS — M25512 Pain in left shoulder: Secondary | ICD-10-CM

## 2021-12-12 DIAGNOSIS — R600 Localized edema: Secondary | ICD-10-CM | POA: Diagnosis not present

## 2021-12-12 MED ORDER — LORAZEPAM 0.5 MG PO TABS: 0.5000 mg | ORAL_TABLET | Freq: Once | ORAL | 0 refills | Status: DC | PRN

## 2021-12-12 NOTE — Therapy (Signed)
[facility] [place] [address] [place], [place], [address] Phone: [phone]   Fax:  [phone]  Physical Therapy Treatment  Patient Details  Name: Kathleen [name] MRN: [id number] Date of Birth: 13-Dec-1940 Referring Provider (PT): Eldred Manges, MD   Encounter Date: 12/12/2021   PT End of Session - 12/12/21 1619     Visit Number 9    Number of Visits 12    Date for PT Re-Evaluation 12/12/21    Authorization Type [employer] Medicare    Authorization - Visit Number 9    Progress Note Due on Visit 10    PT Start Time 1619    PT Stop Time 1700    PT Time Calculation (min) 41 min    Activity Tolerance Patient tolerated treatment well    Behavior During Therapy [facility] for tasks assessed/performed             Past Medical History:  Diagnosis Date   Cancer ([facility])    Hx RT breast infiltrated ductal CA previously on tamoxifen and armidex    Herpes simplex    lt eye   Hypertension    Kidney stones    history   MVP (mitral valve prolapse)    asymptomatic   Personal history of chemotherapy    Personal history of radiation therapy     Past Surgical History:  Procedure Laterality Date   ABDOMINAL HYSTERECTOMY     partial   BREAST BIOPSY     BREAST LUMPECTOMY Right 2000   RT   CERVICAL LAMINECTOMY  1986   SHOULDER SURGERY     rotator cuff repair   throidectomy  1988   For cancer     There were no vitals filed for this visit.   Subjective Assessment - 12/12/21 1657     Subjective Pt saw Dr. Ophelia Charter. He ordered MRI, pt is to get MRI done this Saturday. Dr. Ophelia Charter also states to not perform any TPDN. Reports continued dizziness. Agreeable to vestibular assessment today. States that her hand/thumb did not hurt the night after using ultra sound but did not increased soreness/pain the next day.    Pertinent History stroke 2018; cervical disc sx ~ 30 yrs ago; arthritis; scoliosis; HTN    How long can you sit comfortably? n/a     How long can you stand comfortably? n/a    How long can you walk comfortably? n/a    Diagnostic tests anterolisthesis lumbar spine; ankle xray (-)    Patient Stated Goals Get stronger, use left hand and arm with less pain    Pain Onset More than a month ago                     Vestibular Assessment - 12/12/21 0001       Symptom Behavior   Type of Dizziness  Spinning;Unsteady with head/body turns;"World moves"    Frequency of Dizziness daily    Duration of Dizziness a few seconds    Symptom Nature Motion provoked    Aggravating Factors Lying supine;Sit to stand;Supine to sit    Relieving Factors Closing eyes    Progression of Symptoms No change since onset    History of similar episodes Has had BPPV a few years ago      Positional Testing   Dix-Hallpike Dix-Hallpike Right;Dix-Hallpike Left      Dix-Hallpike Right   Dix-Hallpike Right Duration 8 sec    Dix-Hallpike Right Symptoms Upbeat, right rotatory nystagmus  Dix-Hallpike Left   Dix-Hallpike Left Duration <5 sec    Dix-Hallpike Left Symptoms No nystagmus   Unable to visualize; pt reports mild symptoms                      Vestibular Treatment/Exercise - 12/12/21 0001       Vestibular Treatment/Exercise   Vestibular Treatment Provided Canalith Repositioning    Canalith Repositioning Epley Manuever Right;Epley Manuever Left       EPLEY MANUEVER RIGHT   Number of Reps  1    Overall Response Improved Symptoms       EPLEY MANUEVER LEFT   Number of Reps  1    Overall Response  Improved Symptoms                    PT Education - 12/12/21 1703     Education Details Discussed BPPV. Discussed continuing her shoulder AAROM and isometric exercises.    Person(s) Educated Patient    Methods Explanation;Demonstration    Comprehension Verbalized understanding;Returned demonstration;Verbal cues required;Tactile cues required              Previous:  PT Long Term Goals - 12/10/21  1526       PT LONG TERM GOAL #1   Title Pt will be ind with HEP and progressing her exercises at home    Time 6    Period Weeks    Status On-going    Target Date 12/12/21      PT LONG TERM GOAL #2   Title Pt will be able to elevate L shoulder >100 deg for overhead activity    Baseline Flexion ~90 deg, abduction unable to actively move without pain and increased guarding    Time 6    Period Weeks    Status Partially Met    Target Date 12/12/21      PT LONG TERM GOAL #3   Title Pt will be able to lift and carry at least 40# per her work requirements    Baseline continues to guard    Time 6    Period Weeks    Status Not Met    Target Date 12/12/21      PT LONG TERM GOAL #4   Title Pt will demo L = R wrist/hand AROM for improved ability to open jars and bottles    Baseline L=R wrist AROM; L thumb remains limited in abduction more than R    Time 6    Period Weeks    Status On-going    Target Date 12/12/21            Revised:  PT Long Term Goals - 12/12/21 1705       PT LONG TERM GOAL #1   Title Pt will be ind with HEP and progressing her exercises at home    Baseline Not consistent with her exercises 12/10/21    Time 6    Period Weeks    Status On-going    Target Date 01/23/22      PT LONG TERM GOAL #2   Title Pt will be able to elevate L shoulder >100 deg for overhead activity    Baseline Flexion ~90 deg, abduction unable to actively move without pain and increased guarding    Time 6    Period Weeks    Status Partially Met    Target Date 12/12/21      PT LONG TERM GOAL #3   Title Pt  will be able to lift and carry at least 40# per her work requirements    Baseline Pt is to see ortho after MRI in regards to her L shoulder and lifting (will possibly need surgery)    Time 6    Period Weeks    Status Deferred    Target Date 12/12/21      PT LONG TERM GOAL #4   Title Pt will demo L = R wrist/hand AROM for improved ability to open jars and bottles    Baseline  L=R wrist AROM; L thumb remains limited in abduction more than R, unable to open jars    Time 6    Period Weeks    Status On-going    Target Date 01/23/22      PT LONG TERM GOAL #5   Title Pt will have no symptoms in all canalith positions to demo resolution of her BPPV    Time 6    Period Weeks    Status New    Target Date 01/23/22                   Plan - 12/12/21 1700     Clinical Impression Statement New referral for BPPV. Session focused primarily on assessing pt for BPPV. Found R Dix-hallpike to be (+). Performed Epley maneuver accordingly. Mild symptoms with L Marye Round as well but unable to visualize nystagmus. Performed Epley anyways. Pt too tired after performing canalith repositioning for any other exercises.    Personal Factors and Comorbidities Age;Fitness;Time since onset of injury/illness/exacerbation;Profession    Examination-Activity Limitations Carry;Lift;Hygiene/Grooming;Toileting;Dressing;Reach Overhead;Bathing    Examination-Participation Restrictions Cleaning;Community Activity;Driving;Meal Prep;Valla Leaver [facility]    Stability/Clinical Decision Making Stable/Uncomplicated    PT Frequency 2x / week    PT Duration 6 weeks    PT Treatment/Interventions ADLs/Self Care Home Management;Aquatic Therapy;Cryotherapy;Electrical Stimulation;Iontophoresis 4mg /ml Dexamethasone;Moist Heat;Ultrasound;Functional mobility training;Therapeutic activities;Therapeutic exercise;Neuromuscular re-education;Patient/family education;Manual techniques;Dry needling;Taping;Passive range of motion;Vasopneumatic Device;Vestibular    PT Next Visit Plan Review exercises as needed. Gentle manual/PROM/mobs for wrist, fingers and shoulder. Initiate strengthening if able with wrist and shoulder isometrics. Continue table/wall AAROM for shoulder.    PT Home Exercise Plan Access Code [email or web address]    Consulted and Agree with Plan of Care Patient             Patient will benefit from skilled  therapeutic intervention in order to improve the following deficits and impairments:  Decreased range of motion, Increased fascial restricitons, Decreased activity tolerance, Pain, Increased muscle spasms, Hypomobility, Impaired flexibility, Improper body mechanics, Decreased mobility, Decreased strength, Postural dysfunction, Impaired UE functional use, Increased edema  Visit Diagnosis: Stiffness of left wrist, not elsewhere classified  Acute pain of left shoulder  Pain in left wrist  Stiffness of left shoulder, not elsewhere classified  Muscle weakness (generalized)     Problem List Patient Active Problem List   Diagnosis Date Noted   Gross hematuria 12/04/2021   Left flank pain 12/04/2021   Impingement syndrome of left shoulder 12/02/2021   Radial styloid fracture 09/17/2021   Contusion of left shoulder 09/17/2021   Lumbar foraminal stenosis 09/12/2021   Weight loss 07/30/2021   Restrictive lung disease 05/15/2021   S/P stroke due to cerebrovascular disease 09/17/2018   Primary osteoarthritis of left hip 07/08/2017   Primary osteoarthritis of both knees 02/25/2017   Lumbar spondylosis 12/03/2016   Internal derangement of left knee 10/09/2016   History of breast cancer 11/09/2012   Benign hematuria 07/05/2010   POSTMENOPAUSAL STATUS 03/29/2010  IMPAIRED FASTING GLUCOSE 12/11/2008   LEG PAIN, BILATERAL 01/20/2008   HIP PAIN, RIGHT 01/05/2008   Disorder of bone and cartilage 02/26/2007   Hypothyroidism 04/28/2006   Essential hypertension 04/28/2006   MITRAL VALVE DISORDER 04/28/2006    [facility] [address] PT, DPT 12/12/2021, 5:09 PM  [facility] [place], [place], [address] Phone: [phone]   Fax:  [phone]  Name: Kathleen [name] MRN: [id number] Date of Birth: 1940-07-27

## 2021-12-12 NOTE — Telephone Encounter (Signed)
Anziety med sent to take before MRI  Meds ordered this encounter  Medications   LORazepam (ATIVAN) 0.5 MG tablet    Sig: Take 1 tablet (0.5 mg total) by mouth once as needed for up to 1 dose for anxiety. Before procedure    Dispense:  2 tablet    Refill:  0

## 2021-12-13 ENCOUNTER — Telehealth: Payer: Self-pay | Admitting: Cardiovascular Disease

## 2021-12-13 NOTE — Telephone Encounter (Signed)
I called pt to sch referral appt.  She stated she would like to see if Dr Burt Knack would take her on as a new pt.  Her husband [name] sees him and she would like to as well   Best number for pt is [phone]

## 2021-12-13 NOTE — Telephone Encounter (Signed)
Called pt to sch   Wayen   Bet number [phone]

## 2021-12-13 NOTE — Telephone Encounter (Signed)
Pt contacted me in regards to needing to establish with Cardiology per Dr Madilyn Fireman.  The pt just had a CT 5/22 (in Epic) and was noted to have a small pericardial effusion and an echocardiogram was recommended. She would like to see Dr Burt Knack since he has taken care of her husband, Arden "wayne" Earleen Newport.  I have scheduled the pt for evaluation with Dr Burt Knack on 12/20/2021.

## 2021-12-14 ENCOUNTER — Ambulatory Visit (INDEPENDENT_AMBULATORY_CARE_PROVIDER_SITE_OTHER): Payer: Medicare HMO

## 2021-12-14 DIAGNOSIS — S46112A Strain of muscle, fascia and tendon of long head of biceps, left arm, initial encounter: Secondary | ICD-10-CM | POA: Diagnosis not present

## 2021-12-14 DIAGNOSIS — M75122 Complete rotator cuff tear or rupture of left shoulder, not specified as traumatic: Secondary | ICD-10-CM | POA: Diagnosis not present

## 2021-12-14 DIAGNOSIS — M25512 Pain in left shoulder: Secondary | ICD-10-CM | POA: Diagnosis not present

## 2021-12-14 DIAGNOSIS — M7542 Impingement syndrome of left shoulder: Secondary | ICD-10-CM | POA: Diagnosis not present

## 2021-12-14 DIAGNOSIS — M19012 Primary osteoarthritis, left shoulder: Secondary | ICD-10-CM | POA: Diagnosis not present

## 2021-12-17 ENCOUNTER — Encounter: Payer: Self-pay | Admitting: Physical Therapy

## 2021-12-19 ENCOUNTER — Ambulatory Visit: Payer: Medicare HMO | Admitting: Family Medicine

## 2021-12-20 ENCOUNTER — Encounter: Payer: Self-pay | Admitting: Cardiovascular Disease

## 2021-12-20 ENCOUNTER — Ambulatory Visit: Payer: Medicare HMO | Admitting: Cardiovascular Disease

## 2021-12-20 VITALS — BP 148/82 | HR 71 | Ht 60.0 in | Wt 111.0 lb

## 2021-12-20 DIAGNOSIS — I3139 Other pericardial effusion (noninflammatory): Secondary | ICD-10-CM

## 2021-12-20 DIAGNOSIS — R9431 Abnormal electrocardiogram [ECG] [EKG]: Secondary | ICD-10-CM | POA: Diagnosis not present

## 2021-12-20 NOTE — Progress Notes (Signed)
Cardiology Office Note:    Date:  12/20/2021   ID:  Kathleen [name], DOB 03-10-1941, MRN [id number]  PCP:  Kathleen Marry, MD   [place] Prov[facility] Cardiologist:  Sherren Mocha, MD     Referring MD: Kathleen [name], *   Chief Complaint  Patient presents with   Pericardial Effusion    History of Present Illness:    Kathleen [name] is a 81 y.o. female referred for evaluation of pericardial effusion by Dr. Madilyn Fireman.  The patient recently underwent a CT scan of the abdomen and pelvis to evaluate gross hematuria.  She was incidentally found to have a small pericardial effusion which is new from remote prior imaging.  The patient is here alone today.  She is currently doing well and denies any recent chest pain, chest pressure, orthopnea, PND, heart palpitations, or leg swelling.  She does have generalized fatigue.  She specifically denies any recent fevers or chills.  She has had no international travel.  She denies skin rash.  She has been slow down after a mechanical fall and left arm injury a few months ago.  Past Medical History:  Diagnosis Date   Cancer ([place])    Hx RT breast infiltrated ductal CA previously on tamoxifen and armidex    Herpes simplex    lt eye   Hypertension    Kidney stones    history   MVP (mitral valve prolapse)    asymptomatic   Personal history of chemotherapy    Personal history of radiation therapy     Past Surgical History:  Procedure Laterality Date   ABDOMINAL HYSTERECTOMY     partial   BREAST BIOPSY     BREAST LUMPECTOMY Right 2000   RT   CERVICAL LAMINECTOMY  1986   SHOULDER SURGERY     rotator cuff repair   throidectomy  1988   For cancer     Current Medications: Current Meds  Medication Sig   Ascorbic Acid (VITAMIN C) 100 MG tablet Take 100 mg by mouth daily.   aspirin EC 81 MG tablet Take 81 mg by mouth daily.   atorvastatin (LIPITOR) 80 MG tablet Take 1 tablet (80 mg total) by mouth daily.   cholecalciferol (VITAMIN  D3) 25 MCG (1000 UNIT) tablet Take 4,000 Units by mouth daily.   Coenzyme Q10 (COQ10 PO) Take 1 capsule by mouth daily.   Ferrous Sulfate (IRON PO) Take 1 capsule by mouth daily.   ibuprofen (ADVIL) 800 MG tablet TAKE ONE TABLET EVERY 8 HOURS AS NEEDED   levothyroxine (SYNTHROID) 75 MCG tablet TAKE ONE (1) TABLET BY MOUTH EACH DAY   metoprolol succinate (TOPROL-XL) 25 MG 24 hr tablet TAKE ONE (1) TABLET BY MOUTH EVERY DAY   TURMERIC PO Take 1 capsule by mouth daily.   valACYclovir (VALTREX) 500 MG tablet TAKE ONE (1) TABLET BY MOUTH EACH DAY   Zinc Sulfate (ZINC 15 PO) Take 1 tablet by mouth daily.     Allergies:   Streptomycin, Amlodipine, Codeine, Elemental sulfur, Penicillins, Sulfa antibiotics, Sulfonamide derivatives, Benicar [olmesartan], Hctz [hydrochlorothiazide], and Lisinopril   Social History   Socioeconomic History   Marital status: Married    Spouse name: Patrick Jupiter   Number of children: 1   Years of education: 12   Highest education level: 12th grade  Occupational History   Occupation: Working full-time at [employer].  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  Other Topics Concern   Not on file  Social History Narrative   Lives with her husband. She has one adopted daughter and two grandchildren. She has been working a lot but she enjoys Psychiatric nurse.    Social Determinants of Health   Financial Resource Strain: Low Risk    Difficulty of Paying Living Expenses: Not hard at all  Food Insecurity: No Food Insecurity   Worried About Charity fundraiser in the Last Year: Never true   [place] in the Last Year: Never true  Transportation Needs: No Transportation Needs   Lack of Transportation  (Medical): No   Lack of Transportation (Non-Medical): No  Physical Activity: Inactive   Days of Exercise per Week: 0 days   Minutes of Exercise per Session: 0 min  Stress: No Stress Concern Present   Feeling of Stress : Only a little  Social Connections: Moderately Isolated   Frequency of Communication with Friends and Family: More than three times a week   Frequency of Social Gatherings with Friends and Family: Three times a week   Attends Religious Services: Never   Active Member of Clubs or Organizations: No   Attends Archivist Meetings: Never   Marital Status: Married     Family History: The patient's family history includes Breast cancer in her mother.  ROS:   Please see the history of present illness.    All other systems reviewed and are negative.  EKGs/Labs/Other Studies Reviewed:    The following studies were reviewed today: CT scan 12/09/2021: IMPRESSION: 1. No hydronephrosis.  No renal, ureteral or bladder calculi. 2. No suspicious renal mass. 3. Colonic diverticulosis without findings of acute diverticulitis. 4. Moderate volume of formed stool throughout the colon suggestive of constipation, with fecalized loops of distal small bowel as can be seen with slow transit. 5. Small pericardial effusion is new from remote prior imaging, consider further evaluation with echocardiography. 6.  Aortic Atherosclerosis (ICD10-I70.0).  EKG:  EKG is ordered today.  The ekg ordered today demonstrates normal sinus rhythm 71 bpm, frequent PVCs, left axis deviation, age-indeterminate septal infarct.  Recent Labs: 11/05/2021: TSH 0.51 12/04/2021: ALT 17; BUN 25; Creat 0.78; Hemoglobin 13.9; Platelets 166; Potassium 4.1; Sodium 141  Recent Lipid Panel    Component Value Date/Time   CHOL 141 04/03/2021 0000   TRIG 46 04/03/2021 0000   TRIG 79 07/25/2009 0000   HDL 60 04/03/2021 0000   CHOLHDL 2.4 04/03/2021 0000   VLDL 13 04/16/2016 0958   LDLCALC 68 04/03/2021 0000      Risk Assessment/Calculations:           Physical Exam:    VS:  BP (!) 148/82   Pulse 71   Ht 5' (1.524 m)   Wt 111 lb (50.3 kg)   SpO2 98%   BMI 21.68 kg/m     Wt Readings from Last 3 Encounters:  12/20/21 111 lb (50.3 kg)  12/11/21 109 lb (49.4 kg)  12/05/21 109 lb (49.4 kg)     GEN:  Well nourished, well developed in no acute distress HEENT: Normal NECK: No JVD; No carotid bruits LYMPHATICS: No lymphadenopathy CARDIAC: RRR, no murmurs, rubs, gallops RESPIRATORY:  Clear to auscultation without rales, wheezing or rhonchi  ABDOMEN: Soft,  non-tender, non-distended MUSCULOSKELETAL:  No edema; No deformity  SKIN: Warm and dry NEUROLOGIC:  Alert and oriented x 3 PSYCHIATRIC:  Normal affect   ASSESSMENT:    1. Pericardial effusion   2. Nonspecific abnormal electrocardiogram (ECG) (EKG)    PLAN:    In order of problems listed above:  The patient CT scan is reviewed and demonstrates a small pericardial effusion.  I am hopeful that this is not a clinically significant finding as the patient does not appear to have any cardiac-related symptoms at this time.  I think it is important to evaluate this finding further with an echocardiogram to assess whether this is a physiologic, trivial effusion or potentially a pathologic problem representing a larger effusion.  In addition, will assess LV wall motion as the patient has an abnormal EKG with old septal infarct pattern which may be a nonspecific finding considering her lack of any chest pain or cardiac symptoms.  We will contact her for follow-up once her echocardiogram is completed.  She does not appear to have any systemic symptoms at this time.     Medication Adjustments/Labs and Tests Ordered: Current medicines are reviewed at length with the patient today.  Concerns regarding medicines are outlined above.  Orders Placed This Encounter  Procedures   EKG 12-Lead   ECHOCARDIOGRAM COMPLETE   No orders of the defined  types were placed in this encounter.   Patient Instructions  Medication Instructions:  Your physician recommends that you continue on your current medications as directed. Please refer to the Current Medication list given to you today.  *If you need a refill on your cardiac medications before your next appointment, please call your pharmacy*   Lab Work: NONE If you have labs (blood work) drawn today and your tests are completely normal, you will receive your results only by: [place] (if you have MyChart) OR A paper copy in the mail If you have any lab test that is abnormal or we need to change your treatment, we will call you to review the results.   Testing/Procedures: ECHO Your physician has requested that you have an echocardiogram. Echocardiography is a painless test that uses sound waves to create images of your heart. It provides your doctor with information about the size and shape of your heart and how well your heart's chambers and valves are working. This procedure takes approximately one hour. There are no restrictions for this procedure.  Follow-Up: At [facility], you and your health needs are our priority.  As part of our continuing mission to provide you with exceptional heart care, we have created designated Provider Care Teams.  These Care Teams include your primary Cardiologist (physician) and Advanced Practice Providers (APPs -  Physician Assistants and Nurse Practitioners) who all work together to provide you with the care you need, when you need it.  We recommend signing up for the patient portal called "MyChart".  Sign up information is provided on this After Visit Summary.  MyChart is used to connect with patients for Virtual Visits (Telemedicine).  Patients are able to view lab/test results, encounter notes, upcoming appointments, etc.  Non-urgent messages can be sent to your provider as well.   To learn more about what you can do with MyChart, go to  [email or web address].    Your next appointment:   1 year(s)  The format for your next appointment:   In Person  Provider:   Sherren Mocha, MD1}   Important Information About Sugar  Signed, Sherren Mocha, MD  12/20/2021 12:40 PM    [place]

## 2021-12-20 NOTE — Patient Instructions (Signed)
Medication Instructions:  Your physician recommends that you continue on your current medications as directed. Please refer to the Current Medication list given to you today.  *If you need a refill on your cardiac medications before your next appointment, please call your pharmacy*   Lab Work: NONE If you have labs (blood work) drawn today and your tests are completely normal, you will receive your results only by: [place] (if you have MyChart) OR A paper copy in the mail If you have any lab test that is abnormal or we need to change your treatment, we will call you to review the results.   Testing/Procedures: ECHO Your physician has requested that you have an echocardiogram. Echocardiography is a painless test that uses sound waves to create images of your heart. It provides your doctor with information about the size and shape of your heart and how well your heart's chambers and valves are working. This procedure takes approximately one hour. There are no restrictions for this procedure.  Follow-Up: At [facility], you and your health needs are our priority.  As part of our continuing mission to provide you with exceptional heart care, we have created designated Provider Care Teams.  These Care Teams include your primary Cardiologist (physician) and Advanced Practice Providers (APPs -  Physician Assistants and Nurse Practitioners) who all work together to provide you with the care you need, when you need it.  We recommend signing up for the patient portal called "MyChart".  Sign up information is provided on this After Visit Summary.  MyChart is used to connect with patients for Virtual Visits (Telemedicine).  Patients are able to view lab/test results, encounter notes, upcoming appointments, etc.  Non-urgent messages can be sent to your provider as well.   To learn more about what you can do with MyChart, go to [email or web address].    Your next appointment:   1  year(s)  The format for your next appointment:   In Person  Provider:   Sherren Mocha, MD1}   Important Information About Sugar

## 2021-12-24 ENCOUNTER — Ambulatory Visit: Payer: Medicare HMO | Attending: Family Medicine | Admitting: Physical Therapy

## 2021-12-24 DIAGNOSIS — R42 Dizziness and giddiness: Secondary | ICD-10-CM | POA: Insufficient documentation

## 2021-12-24 DIAGNOSIS — M6281 Muscle weakness (generalized): Secondary | ICD-10-CM | POA: Insufficient documentation

## 2021-12-24 NOTE — Therapy (Addendum)
[place] [place], [place], [address] Phone: [phone]   Fax:  [phone]  Physical Therapy Treatment and Discharge  Patient Details  Name: Kathleen [name] MRN: [id number] Date of Birth: [birth year] Referring Provider (PT): Marybelle Killings, MD  PHYSICAL THERAPY DISCHARGE SUMMARY  Visits from Start of Care: 10  Current functional level related to goals / functional outcomes: See below   Remaining deficits: Continued pain and limited shoulder ROM.    Education / Equipment: Holding PT for shoulder in lieu of medical reassessment   Patient agrees to discharge. Patient goals were partially met. Patient is being discharged due to a change in medical status.   Encounter Date: 12/24/2021   PT End of Session - 12/24/21 1058     Visit Number 10    Number of Visits 12    Date for PT Re-Evaluation 01/23/22    Authorization Type [employer]    Authorization - Visit Number 10    Progress Note Due on Visit 10    PT Start Time 1020    PT Stop Time 1055    PT Time Calculation (min) 35 min    Activity Tolerance Patient tolerated treatment well    Behavior During Therapy WFL for tasks assessed/performed             Past Medical History:  Diagnosis Date   Cancer ([place])    Hx RT breast infiltrated ductal CA previously on tamoxifen and armidex    Herpes simplex    lt eye   Hypertension    Kidney stones    history   MVP (mitral valve prolapse)    asymptomatic   Personal history of chemotherapy    Personal history of radiation therapy     Past Surgical History:  Procedure Laterality Date   ABDOMINAL HYSTERECTOMY     partial   BREAST BIOPSY     BREAST LUMPECTOMY Right 2000   RT   CERVICAL LAMINECTOMY  1986   SHOULDER SURGERY     rotator cuff repair   throidectomy  1988   For cancer     There were no vitals filed for this visit.   Subjective Assessment - 12/24/21 1026     Subjective Pt  states that she no longer has spinning sensation but more "foggy." Saw cardiologist and had EKG. L shoulder continues to give problems. Pt is to see ortho on Tuesday. Pt reports her left hand has been feeling better. It's still tender to touch but better overall.    Pertinent History stroke 2018; cervical disc sx ~ 30 yrs ago; arthritis; scoliosis; HTN    How long can you sit comfortably? n/a    How long can you stand comfortably? n/a    How long can you walk comfortably? n/a    Diagnostic tests anterolisthesis lumbar spine; ankle xray (-)    Patient Stated Goals Get stronger, use left hand and arm with less pain    Currently in Pain? No/denies    Pain Onset More than a month ago                     Vestibular Assessment - 12/24/21 0001       Positional Testing   Horizontal Canal Testing Horizontal Canal Right;Horizontal Canal Left      Dix-Hallpike Right   Dix-Hallpike Right Duration 0    Dix-Hallpike Right Symptoms --   "just a fog"     Dix-Hallpike Left  Dix-Hallpike Left Duration 0    Dix-Hallpike Left Symptoms --   "just a fog"     Horizontal Canal Right   Horizontal Canal Right Duration 0      Horizontal Canal Left   Horizontal Canal Left Duration 0                       Vestibular Treatment/Exercise - 12/24/21 0001       Vestibular Treatment/Exercise   Vestibular Treatment Provided Gaze    Gaze Exercises X1 Viewing Horizontal;X1 Viewing Vertical;Eye/Head Exercise Horizontal;Eye/Head Exercise Vertical       EPLEY MANUEVER RIGHT   Number of Reps  1    Response Details  performed prophylactically due to pt "feeling fog"       EPLEY MANUEVER LEFT   Number of Reps  1     RESPONSE DETAILS LEFT performed prophylactically due to pt "feeling fog"      X1 Viewing Horizontal   Foot Position seated    Reps 2    Comments x20 sec      X1 Viewing Vertical   Foot Position seated    Reps 2    Comments x20 sec      Eye/Head Exercise Horizontal    Foot Position seated    Reps 2    Comments x20 sec saccades & smooth pursuit      Eye/Head Exercise Vertical   Foot Position seated    Reps 2    Comments x20 sec saccades & smooth pursuit                    PT Education - 12/24/21 1101     Education Details Discussed BPPV management/vestibular exercises. Discussed continuing her shoulder exercises.    Person(s) Educated Patient    Methods Explanation;Demonstration    Comprehension Verbalized understanding;Returned demonstration;Verbal cues required;Tactile cues required                 PT Long Term Goals - 12/24/21 1059       PT LONG TERM GOAL #1   Title Pt will be ind with HEP and progressing her exercises at home    Baseline Not consistent with her exercises 12/10/21    Time 6    Period Weeks    Status On-going    Target Date 01/23/22      PT LONG TERM GOAL #2   Title Pt will be able to elevate L shoulder >100 deg for overhead activity    Baseline Flexion ~90 deg, abduction unable to actively move without pain and increased guarding    Time 6    Period Weeks    Status Partially Met    Target Date 12/12/21      PT LONG TERM GOAL #3   Title Pt will be able to lift and carry at least 40# per her work requirements    Baseline Pt is to see ortho after MRI in regards to her L shoulder and lifting (will possibly need surgery)    Time 6    Period Weeks    Status Deferred    Target Date 12/12/21      PT LONG TERM GOAL #4   Title Pt will demo L = R wrist/hand AROM for improved ability to open jars and bottles    Baseline L=R wrist AROM; L thumb remains limited in abduction more than R, unable to open jars    Time 6    Period  Weeks    Status On-going    Target Date 01/23/22      PT LONG TERM GOAL #5   Title Pt will have no symptoms in all canalith positions to demo resolution of her BPPV    Time 6    Period Weeks    Status Achieved    Target Date 01/23/22                   Plan -  12/24/21 1059     Clinical Impression Statement No s/s of BPPV this session. Pt currently feeling "fog". Initiated gaze stabilization exercises. Encouraged pt to perform her exercises at home as she has not been consistent and has been feeling too fatigued.    Personal Factors and Comorbidities Age;Fitness;Time since onset of injury/illness/exacerbation;Profession    Examination-Activity Limitations Carry;Lift;Hygiene/Grooming;Toileting;Dressing;Reach Overhead;Bathing    Examination-Participation Restrictions Cleaning;Community Activity;Driving;Meal Prep;[facility]    Stability/Clinical Decision Making Stable/Uncomplicated    PT Frequency 2x / week    PT Duration 6 weeks    PT Treatment/Interventions ADLs/Self Care Home Management;Aquatic Therapy;Cryotherapy;Electrical Stimulation;Iontophoresis 53m/ml Dexamethasone;Moist Heat;Ultrasound;Functional mobility training;Therapeutic activities;Therapeutic exercise;Neuromuscular re-education;Patient/family education;Manual techniques;Dry needling;Taping;Passive range of motion;Vasopneumatic Device;Vestibular    PT Next Visit Plan Review exercises as needed. Gentle manual/PROM/mobs for wrist, fingers and shoulder. Initiate strengthening if able with wrist and shoulder isometrics. Continue table/wall AAROM for shoulder.    PT Home Exercise Plan Access Code [email or web address]    Consulted and Agree with Plan of Care Patient             Patient will benefit from skilled therapeutic intervention in order to improve the following deficits and impairments:  Decreased range of motion, Increased fascial restricitons, Decreased activity tolerance, Pain, Increased muscle spasms, Hypomobility, Impaired flexibility, Improper body mechanics, Decreased mobility, Decreased strength, Postural dysfunction, Impaired UE functional use, Increased edema  Visit Diagnosis: Dizziness and giddiness  Muscle weakness (generalized)     Problem List Patient Active Problem  List   Diagnosis Date Noted   Gross hematuria 12/04/2021   Left flank pain 12/04/2021   Impingement syndrome of left shoulder 12/02/2021   Radial styloid fracture 09/17/2021   Contusion of left shoulder 09/17/2021   Lumbar foraminal stenosis 09/12/2021   Weight loss 07/30/2021   Restrictive lung disease 05/15/2021   S/P stroke due to cerebrovascular disease 09/17/2018   Primary osteoarthritis of left hip 07/08/2017   Primary osteoarthritis of both knees 02/25/2017   Lumbar spondylosis 12/03/2016   Internal derangement of left knee 10/09/2016   History of breast cancer 11/09/2012   Benign hematuria 07/05/2010   POSTMENOPAUSAL STATUS 03/29/2010   IMPAIRED FASTING GLUCOSE 12/11/2008   LEG PAIN, BILATERAL 01/20/2008   HIP PAIN, RIGHT 01/05/2008   Disorder of bone and cartilage 02/26/2007   Hypothyroidism 04/28/2006   Essential hypertension 04/28/2006   MITRAL VALVE DISORDER 04/28/2006    GChrist[facility] - WinnsboroApril Ma L Leelan Rajewski, PT, DPT 12/24/2021, 11:02 AM  [facility] [address]Phone: [phone]  Fax:  [phone] Name: Kathleen ManwarrenMRN: [id number] of Birth: 12/02/1940-08-07

## 2021-12-31 ENCOUNTER — Ambulatory Visit: Payer: Medicare HMO | Admitting: Orthopaedic Surgery

## 2021-12-31 ENCOUNTER — Telehealth: Payer: Self-pay | Admitting: Orthopaedic Surgery

## 2021-12-31 DIAGNOSIS — M75122 Complete rotator cuff tear or rupture of left shoulder, not specified as traumatic: Secondary | ICD-10-CM

## 2021-12-31 NOTE — Progress Notes (Unsigned)
   Office Visit Note   Patient: Kathleen [name]           Date of Birth: 12-Jan-1941           MRN: [id number] Visit Date: 12/31/2021              Requested by: Hali Marry, [place],  [place] [address] PCP: Hali Marry, MD   Assessment & Plan: Visit Diagnoses: No diagnosis found.  Plan: ***  Follow-Up Instructions: No follow-ups on file.   Orders:  No orders of the defined types were placed in this encounter.  No orders of the defined types were placed in this encounter.     Procedures: No procedures performed   Clinical Data: No additional findings.   Subjective: Chief Complaint  Patient presents with   Left Shoulder - Follow-up    HPI  Review of Systems   Objective: Vital Signs: BP (!) 143/74   Pulse 79   Physical Exam  Ortho Exam  Specialty Comments:  No specialty comments available.  Imaging: No results found.   PMFS History: Patient Active Problem List   Diagnosis Date Noted   Gross hematuria 12/04/2021   Left flank pain 12/04/2021   Impingement syndrome of left shoulder 12/02/2021   Radial styloid fracture 09/17/2021   Contusion of left shoulder 09/17/2021   Lumbar foraminal stenosis 09/12/2021   Weight loss 07/30/2021   Restrictive lung disease 05/15/2021   S/P stroke due to cerebrovascular disease 09/17/2018   Primary osteoarthritis of left hip 07/08/2017   Primary osteoarthritis of both knees 02/25/2017   Lumbar spondylosis 12/03/2016   Internal derangement of left knee 10/09/2016   History of breast cancer 11/09/2012   Benign hematuria 07/05/2010   POSTMENOPAUSAL STATUS 03/29/2010   IMPAIRED FASTING GLUCOSE 12/11/2008   LEG PAIN, BILATERAL 01/20/2008   HIP PAIN, RIGHT 01/05/2008   Disorder of bone and cartilage 02/26/2007   Hypothyroidism 04/28/2006   Essential hypertension 04/28/2006   MITRAL VALVE DISORDER 04/28/2006   Past Medical History:  Diagnosis Date   Cancer ([place])     Hx RT breast infiltrated ductal CA previously on tamoxifen and armidex    Herpes simplex    lt eye   Hypertension    Kidney stones    history   MVP (mitral valve prolapse)    asymptomatic   Personal history of chemotherapy    Personal history of radiation therapy     Family History  Problem Relation Age of Onset   Breast cancer Mother     Past Surgical History:  Procedure Laterality Date   ABDOMINAL HYSTERECTOMY     partial   BREAST BIOPSY     BREAST LUMPECTOMY Right 2000   RT   CERVICAL LAMINECTOMY  1986   SHOULDER SURGERY     rotator cuff repair   throidectomy  1988   For cancer    Social History   Occupational History   Occupation: Working full-time at [employer].  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

## 2021-12-31 NOTE — Telephone Encounter (Signed)
Patient would need a work note stating she cannot come back to work on 06/26 her next appt with dr dean is not until 07/03 make note like last time with restriction and make the return date 07/07 until Marlou Sa sees her and says otherwise

## 2022-01-01 DIAGNOSIS — M75122 Complete rotator cuff tear or rupture of left shoulder, not specified as traumatic: Secondary | ICD-10-CM | POA: Insufficient documentation

## 2022-01-01 HISTORY — DX: Complete rotator cuff tear or rupture of left shoulder, not specified as traumatic: M75.122

## 2022-01-08 ENCOUNTER — Ambulatory Visit (HOSPITAL_COMMUNITY): Payer: Medicare HMO | Attending: Cardiology

## 2022-01-08 DIAGNOSIS — I3139 Other pericardial effusion (noninflammatory): Secondary | ICD-10-CM | POA: Diagnosis not present

## 2022-01-08 LAB — ECHOCARDIOGRAM COMPLETE
Area-P 1/2: 4.2 cm2
S' Lateral: 3.45 cm

## 2022-01-14 ENCOUNTER — Other Ambulatory Visit: Payer: Self-pay | Admitting: Family Medicine

## 2022-01-15 ENCOUNTER — Ambulatory Visit (INDEPENDENT_AMBULATORY_CARE_PROVIDER_SITE_OTHER): Payer: Medicare HMO | Admitting: Family Medicine

## 2022-01-15 ENCOUNTER — Encounter: Payer: Self-pay | Admitting: Family Medicine

## 2022-01-15 VITALS — BP 132/67 | HR 62 | Resp 18 | Ht 60.0 in | Wt 110.0 lb

## 2022-01-15 DIAGNOSIS — S90422A Blister (nonthermal), left great toe, initial encounter: Secondary | ICD-10-CM | POA: Diagnosis not present

## 2022-01-15 MED ORDER — MUPIROCIN 2 % EX OINT: TOPICAL_OINTMENT | CUTANEOUS | 0 refills | Status: DC

## 2022-01-15 NOTE — Addendum Note (Signed)
Addended by: Izora Gala on: 01/15/2022 01:44 PM   Modules accepted: Orders

## 2022-01-15 NOTE — Progress Notes (Signed)
   Acute Office Visit  Subjective:     Patient ID: Kathleen [name], female    DOB: Sep 23, 1940, 81 y.o.   MRN: [id number]  Chief Complaint  Patient presents with   Foot wound    Patient states she has a blister on the inner left big toe for 2 days. Patient states it itches and painful.     HPI Patient is in today for Patient states she has a blister on the inner left big toe for 2 days. Patient states it itches and painful. Not know trauma, bite or shoe irritation. Usually wears open shoes with no pressure on that area.  No fever. She also noticed she had a red spot between 2 toes on her same foot that seem to be drying up.    ROS      Objective:    BP 132/67 (BP Location: Left Arm)   Pulse 62   Resp 18   Ht 5' (1.524 m)   Wt 110 lb (49.9 kg)   SpO2 94%   BMI 21.48 kg/m    Physical Exam Vitals reviewed.  Constitutional:      Appearance: She is well-developed.  HENT:     Head: Normocephalic and atraumatic.  Eyes:     Conjunctiva/sclera: Conjunctivae normal.  Cardiovascular:     Rate and Rhythm: Normal rate.  Pulmonary:     Effort: Pulmonary effort is normal.  Musculoskeletal:     Comments: She has a large 1.3  cm yellow blister  on medial left foot near the base of great toe. There is about 1 cm of surrounding erythema. No streaking.    Skin:    General: Skin is dry.     Coloration: Skin is not pale.  Neurological:     Mental Status: She is alert and oriented to person, place, and time.  Psychiatric:        Behavior: Behavior normal.     No results found for any visits on 01/15/22.      Assessment & Plan:   Problem List Items Addressed This Visit   None Visit Diagnoses     Blister of left great toe, initial encounter    -  Primary      Blister - area cleaned with alcohol and lanced.  Culture obtained. Unclear of cause.  Ok to continue topical anti-itch cream. Also given mupirocin ointment. Monitor for spreading redness.    Meds ordered this encounter   Medications   mupirocin ointment (BACTROBAN) 2 %    Sig: Apply to inside of each nares daily for 10 days then twice a week for maintenance.    Dispense:  30 g    Refill:  0    She also wanted me to know that she has a consult with orthopedic specialist for her left shoulder to discuss possible surgery as an option she.  She has had persistent weakness and decreased range of motion.  Return if symptoms worsen or fail to improve.  Beatrice Lecher, MD

## 2022-01-17 NOTE — Progress Notes (Signed)
Hi Simi,   So far the culture hasn't grown anything out.

## 2022-01-20 ENCOUNTER — Ambulatory Visit (INDEPENDENT_AMBULATORY_CARE_PROVIDER_SITE_OTHER): Payer: Medicare HMO | Admitting: Orthopedic Surgery

## 2022-01-20 ENCOUNTER — Telehealth: Payer: Self-pay

## 2022-01-20 DIAGNOSIS — M12812 Other specific arthropathies, not elsewhere classified, left shoulder: Secondary | ICD-10-CM

## 2022-01-20 DIAGNOSIS — M25512 Pain in left shoulder: Secondary | ICD-10-CM

## 2022-01-20 DIAGNOSIS — G8929 Other chronic pain: Secondary | ICD-10-CM

## 2022-01-20 LAB — WOUND CULTURE
MICRO NUMBER:: 13588000
RESULT:: NO GROWTH
SPECIMEN QUALITY:: ADEQUATE

## 2022-01-20 NOTE — Telephone Encounter (Signed)
New note emailed to [email or web address] and [email or web address] per patient request.

## 2022-01-23 ENCOUNTER — Telehealth: Payer: Self-pay | Admitting: Cardiology

## 2022-01-23 NOTE — Telephone Encounter (Signed)
Hi! This patient called the [facility] office today about her recent echocardiogram results looking at her pericardial effusion. Sounds like she is awaiting shoulder surgery and cannot schedule until she has been cleared. I did not review with her as I was unsure what the plan would be.  Thanks guys! JM

## 2022-01-24 ENCOUNTER — Telehealth (HOSPITAL_COMMUNITY): Payer: Self-pay | Admitting: *Deleted

## 2022-01-24 ENCOUNTER — Telehealth: Payer: Self-pay | Admitting: Orthopedic Surgery

## 2022-01-24 ENCOUNTER — Telehealth: Payer: Self-pay

## 2022-01-24 ENCOUNTER — Encounter: Payer: Self-pay | Admitting: Cardiovascular Disease

## 2022-01-24 DIAGNOSIS — R931 Abnormal findings on diagnostic imaging of heart and coronary circulation: Secondary | ICD-10-CM

## 2022-01-24 DIAGNOSIS — R9431 Abnormal electrocardiogram [ECG] [EKG]: Secondary | ICD-10-CM

## 2022-01-24 MED ORDER — LOSARTAN POTASSIUM 25 MG PO TABS: 25.0000 mg | ORAL_TABLET | Freq: Every day | ORAL | 3 refills | Status: DC

## 2022-01-24 NOTE — Telephone Encounter (Signed)
Spoke with patient who understands and agrees to plan. Losartan sent to pharmacy on file at this time and Coronary CT placed. Instructions will be mailed out to her today for the test. Will route to Cardiac Imaging nurse navigators to inform that patient is needing this done ASAP so that she can move forward with her shoulder surgery (she's unable to work until surgery is complete).

## 2022-01-24 NOTE — Telephone Encounter (Signed)
Pt is wanting to go to [facility] #[phone]  [address]  To have her CT Scan of the shoulder - regarding the surgery

## 2022-01-24 NOTE — Telephone Encounter (Signed)
-----   Message from Sherren Mocha, MD sent at 01/24/2022  1:17 PM EDT ----- Study reviewed.  Patient has regional wall motion abnormalities in the pattern of coronary artery disease, LAD territory.  She did not report any angina or shortness of breath at the time of her recent evaluation.  However, with her abnormal EKG, reduced LV function, and regional wall motion abnormality seen on echo, I would recommend an ischemic assessment with a gated coronary CTA scan.  Please arrange follow-up after her CT scan is completed.  Would also add losartan 25 mg daily to her medical regimen in the setting of LV dysfunction with LVEF 40 to 45%.  Coronary CTA scan should be completed before any noncardiac surgery is done.

## 2022-01-24 NOTE — Telephone Encounter (Signed)
Got a message to urgently schedule this patient's cardiac CT. Reached to patient to offer a spot on Tuesday. Appointment made for July 11 at 1:30pm Patient is aware to arrive at 1pm for test. She takes metorpolol succinate nightly and is aware of her NPO status for test. She denies an allergy to IV dye.  Gordy Clement RN Navigator Cardiac Imaging [facility] [phone] Office [phone] Cell

## 2022-01-25 ENCOUNTER — Encounter: Payer: Self-pay | Admitting: Orthopedic Surgery

## 2022-01-25 NOTE — Progress Notes (Signed)
Office Visit Note   Patient: Kathleen [name]           Date of Birth: Dec 19, 1940           MRN: [id number] Visit Date: 01/20/2022 Requested by: Hali Marry, [address] [place],  [place] [address] PCP: Hali Marry, MD  Subjective: Chief Complaint  Patient presents with   Left Shoulder - Pain    HPI: Kathleen [name] is an 81 year old patient with left shoulder pain.  She has had left shoulder pain since a fall 09/13/2021.  Has not had an injection.  Did try physical therapy but it did not help.  The pain wakes her from sleep at night.  She is right-hand dominant.  Reports decreased range of motion actively and passively along with difficulties with activities of daily living.  She still is working 40 hours a week at [employer].  She has been there 20 years.  She has to do lifting up to 40 pounds.  Has a history of neck surgery which was performed by her report from the back.  Also has a history of stroke in 2018.  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  She reports having prior right shoulder rotator cuff tear repair done by Dr. Edyth Gunnels.  No prior left shoulder surgery.  MRI scan of the left shoulder is reviewed.  It shows massive full-thickness tear of the entire supraspinatus and majority of the infraspinatus tendon as well as the superior 10 mm of the subscapularis tendon insertion with moderate humeral head and superior glenoid cartilage thinning.  Long head of the biceps tendon also ruptured.  There is severe supraspinatus severe anterior infraspinatus and moderate superior subscapularis muscle atrophy.  Notably Nakaiya also has had a heart echocardiogram last week which shows pericardial fluid but no cardiac tamponade.              ROS: All systems reviewed are negative as they relate to the chief complaint within the history of present illness.  Patient denies  fevers or chills.   Assessment & Plan: Visit Diagnoses:  1. Chronic left shoulder pain   2. Rotator cuff  arthropathy of left shoulder     Plan: Impression is left shoulder rotator cuff tear and rotator cuff arthropathy following a fall.  Tendons do not really look repairable and the muscle atrophy is another strike against considering potential repair.  Her best bet for predictable shoulder function and pain relief would be reverse shoulder replacement.  Thin cut CT scan for preoperative patient specific instrumentation and planning his order.  Note for out of work for 4 weeks pending CT scan and cardiac evaluation written.  We will see how her cardiac evaluation goes in terms of the fluid around her heart and plan potentially for reverse replacement sometime in the near future.  The risk and benefits of surgical intervention are discussed with the patient including not limited to infection nerve vessel damage instability as well as incomplete pain relief and incomplete restoration of function.  Did use models to show her how the reverse replacement works as well as the [employer] that be used intraoperatively.  Plan to see her back after the CT scan.  Follow-Up Instructions: No follow-ups on file.   Orders:  Orders Placed This Encounter  Procedures   CT SHOULDER LEFT WO CONTRAST   No orders of the defined types were placed in this encounter.     Procedures: No procedures performed  Clinical Data: No additional findings.  Objective: Vital Signs: There were no vitals taken for this visit.  Physical Exam:   Constitutional: Patient appears well-developed HEENT:  Head: Normocephalic Eyes:EOM are normal Neck: Normal range of motion Cardiovascular: Normal rate Pulmonary/chest: Effort normal Neurologic: Patient is alert Skin: Skin is warm Psychiatric: Patient has normal mood and affect   Ortho Exam: Ortho exam demonstrates passive range of motion on the right of 85/100/160.  On the left motion is 85/100/160.  Actively she has about 90 degrees of forward flexion on the left and  140 on the right.  Abduction is only about 60 on the left compared to more than 90 on the right.  She does have weakness to infraspinatus supraspinatus and subscap muscle testing on the left at 4- out of 5 compared to the right at 5 out of 5 for all 3 muscles.  Coarse grinding and crepitus is present with passive range of motion of that left shoulder.  Specialty Comments:  No specialty comments available.  Imaging: No results found.   PMFS History: Patient Active Problem List   Diagnosis Date Noted   Complete tear of left rotator cuff 01/01/2022   Gross hematuria 12/04/2021   Left flank pain 12/04/2021   Impingement syndrome of left shoulder 12/02/2021   Radial styloid fracture 09/17/2021   Contusion of left shoulder 09/17/2021   Lumbar foraminal stenosis 09/12/2021   Weight loss 07/30/2021   Restrictive lung disease 05/15/2021   S/P stroke due to cerebrovascular disease 09/17/2018   Primary osteoarthritis of left hip 07/08/2017   Primary osteoarthritis of both knees 02/25/2017   Lumbar spondylosis 12/03/2016   Internal derangement of left knee 10/09/2016   History of breast cancer 11/09/2012   Benign hematuria 07/05/2010   POSTMENOPAUSAL STATUS 03/29/2010   IMPAIRED FASTING GLUCOSE 12/11/2008   LEG PAIN, BILATERAL 01/20/2008   HIP PAIN, RIGHT 01/05/2008   Disorder of bone and cartilage 02/26/2007   Hypothyroidism 04/28/2006   Essential hypertension 04/28/2006   MITRAL VALVE DISORDER 04/28/2006   Past Medical History:  Diagnosis Date   Cancer ([place])    Hx RT breast infiltrated ductal CA previously on tamoxifen and armidex    Herpes simplex    lt eye   Hypertension    Kidney stones    history   MVP (mitral valve prolapse)    asymptomatic   Personal history of chemotherapy    Personal history of radiation therapy     Family History  Problem Relation Age of Onset   Breast cancer Mother     Past Surgical History:  Procedure Laterality Date   ABDOMINAL HYSTERECTOMY      partial   BREAST BIOPSY     BREAST LUMPECTOMY Right 2000   RT   CERVICAL LAMINECTOMY  1986   SHOULDER SURGERY     rotator cuff repair   throidectomy  1988   For cancer    Social History   Occupational History   Occupation: Working full-time at [employer].  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

## 2022-01-28 ENCOUNTER — Ambulatory Visit (HOSPITAL_COMMUNITY)
Admission: RE | Admit: 2022-01-28 | Discharge: 2022-01-28 | Disposition: A | Discharge status: Home / Self Care | Payer: Medicare HMO | Source: Ambulatory Visit | Attending: Cardiovascular Disease | Admitting: Cardiovascular Disease

## 2022-01-28 DIAGNOSIS — I2510 Atherosclerotic heart disease of native coronary artery without angina pectoris: Secondary | ICD-10-CM | POA: Diagnosis not present

## 2022-01-28 DIAGNOSIS — R931 Abnormal findings on diagnostic imaging of heart and coronary circulation: Secondary | ICD-10-CM | POA: Insufficient documentation

## 2022-01-28 DIAGNOSIS — R9431 Abnormal electrocardiogram [ECG] [EKG]: Secondary | ICD-10-CM | POA: Insufficient documentation

## 2022-01-28 MED ORDER — METOPROLOL TARTRATE 5 MG/5ML IV SOLN
INTRAVENOUS | Status: AC
  Administered 2022-01-28: 5 mg via INTRAVENOUS
  Filled 2022-01-28: qty 20

## 2022-01-28 MED ORDER — NITROGLYCERIN 0.4 MG SL SUBL: 0.8000 mg | SUBLINGUAL_TABLET | Freq: Once | SUBLINGUAL | Status: DC

## 2022-01-28 MED ORDER — NITROGLYCERIN 0.4 MG SL SUBL
SUBLINGUAL_TABLET | SUBLINGUAL | Status: AC
  Filled 2022-01-28: qty 2

## 2022-01-28 MED ORDER — IOHEXOL 350 MG/ML SOLN
100.0000 mL | Freq: Once | INTRAVENOUS | Status: AC | PRN
  Administered 2022-01-28: 100 mL via INTRAVENOUS

## 2022-01-28 MED ORDER — METOPROLOL TARTRATE 5 MG/5ML IV SOLN: 10.0000 mg | INTRAVENOUS | Status: DC | PRN

## 2022-02-03 ENCOUNTER — Encounter: Payer: Self-pay | Admitting: Cardiovascular Disease

## 2022-02-03 ENCOUNTER — Ambulatory Visit: Payer: Medicare HMO | Admitting: Cardiovascular Disease

## 2022-02-03 VITALS — BP 104/62 | HR 67 | Ht 60.0 in | Wt 111.4 lb

## 2022-02-03 DIAGNOSIS — I3139 Other pericardial effusion (noninflammatory): Secondary | ICD-10-CM

## 2022-02-03 DIAGNOSIS — I493 Ventricular premature depolarization: Secondary | ICD-10-CM

## 2022-02-03 DIAGNOSIS — I2510 Atherosclerotic heart disease of native coronary artery without angina pectoris: Secondary | ICD-10-CM | POA: Diagnosis not present

## 2022-02-03 DIAGNOSIS — R9431 Abnormal electrocardiogram [ECG] [EKG]: Secondary | ICD-10-CM | POA: Diagnosis not present

## 2022-02-03 DIAGNOSIS — Z01810 Encounter for preprocedural cardiovascular examination: Secondary | ICD-10-CM

## 2022-02-03 MED ORDER — CLOPIDOGREL BISULFATE 75 MG PO TABS: 75.0000 mg | ORAL_TABLET | Freq: Every day | ORAL | 3 refills | Status: DC

## 2022-02-03 NOTE — Patient Instructions (Signed)
Medication Instructions:  STOP Losartan START Plavix (Clopidogrel) '75mg'$  daily *If you need a refill on your cardiac medications before your next appointment, please call your pharmacy*   Lab Work: CBC, BMET today If you have labs (blood work) drawn today and your tests are completely normal, you will receive your results only by: [place] (if you have MyChart) OR A paper copy in the mail If you have any lab test that is abnormal or we need to change your treatment, we will call you to review the results.   Testing/Procedures: L & R Heart catheterization Your physician has requested that you have a cardiac catheterization. Cardiac catheterization is used to diagnose and/or treat various heart conditions. Doctors may recommend this procedure for a number of different reasons. The most common reason is to evaluate chest pain. Chest pain can be a symptom of coronary artery disease (CAD), and cardiac catheterization can show whether plaque is narrowing or blocking your heart's arteries. This procedure is also used to evaluate the valves, as well as measure the blood flow and oxygen levels in different parts of your heart. For further information please visit [email or web address]. Please follow instruction sheet, as given.  Follow-Up: At [facility], you and your health needs are our priority.  As part of our continuing mission to provide you with exceptional heart care, we have created designated Provider Care Teams.  These Care Teams include your primary Cardiologist (physician) and Advanced Practice Providers (APPs -  Physician Assistants and Nurse Practitioners) who all work together to provide you with the care you need, when you need it.  We recommend signing up for the patient portal called "MyChart".  Sign up information is provided on this After Visit Summary.  MyChart is used to connect with patients for Virtual Visits (Telemedicine).  Patients are able to view lab/test results,  encounter notes, upcoming appointments, etc.  Non-urgent messages can be sent to your provider as well.   To learn more about what you can do with MyChart, go to [email or web address].    Your next appointment:   1 year(s)  The format for your next appointment:   In Person  Provider:   Sherren Mocha, MD     Other Instructions  [place] OFFICE [address] [place], [place] [address] Dept: [phone] Loc: [phone]  Kenndra Morris  02/03/2022  You are scheduled for a Cardiac Catheterization on Monday, July 24 with Dr. Sherren Mocha.  1. Please arrive at the Main Entrance A at [facility]: [place], [place] [address] at 5:30 AM (This time is two hours before your procedure to ensure your preparation). Free valet parking service is available.   Special note: Every effort is made to have your procedure done on time. Please understand that emergencies sometimes delay scheduled procedures.  2. Diet: Do not eat solid foods after midnight.  You may have clear liquids until 5 AM upon the day of the procedure.  3. Labs: TODAY  4. Medication instructions in preparation for your procedure:   Contrast Allergy: No  On the morning of your procedure, take Aspirin '81mg'$  and Plavix 75g and any morning medicines NOT listed above.  You may use sips of water.  5. Plan to go home the same day, you will only stay overnight if medically necessary. 6. You MUST have a responsible adult to drive you home. 7. An adult MUST be with you the first  24 hours after you arrive home. 8. Bring a current list of your medications, and the last time and date medication taken. 9. Bring ID and current insurance cards. 10.Please wear clothes that are easy to get on and off and wear slip-on shoes.  Thank you for allowing [place] to care for you!   -- [facility]    Important Information About Sugar

## 2022-02-03 NOTE — H&P (View-Only) (Signed)
Cardiology Office Note:    Date:  02/03/2022   ID:  Kathleen [name], DOB May 26, 1941, MRN [id number]  PCP:  Hali Marry, MD   [facility] Cardiologist:  Sherren Mocha, MD     Referring MD: Hali Marry, *   Chief Complaint  Patient presents with   Coronary Artery Disease    History of Present Illness:    Kathleen [name] is a 81 y.o. female returning for follow-up evaluation after a coronary CTA demonstrated high-grade proximal LAD stenosis.  The patient was initially seen in December 20, 2021 for evaluation of a pericardial effusion, newly diagnosed when the patient underwent a CT scan of the abdomen and pelvis to evaluate gross hematuria.  She did not have any major pathology found in kidneys or bladder.  She was treated for urinary tract infection with IV Rocephin.  She has had some very mild hematuria since that but no major problems reported.  She denies any chest pain or pressure.  However, she complains of marked fatigue and generalized weakness.  She had a fall in February of this year and has felt poorly ever since that time.  She does not ever recall having prolonged chest discomfort or any typical angina with exertion.  She does have mild shortness of breath with activity and clearly describes exercise intolerance with a functional class III limitation.  As part of her evaluation, she had an echocardiogram to reassess her pericardial effusion.  This demonstrated a moderate pericardial effusion but also showed LV dysfunction with LVEF 40 to 45% with both global and segmental regional wall motion abnormalities consistent with LAD territory infarction.  This correlated well with her EKG which showed an old septal infarct.  I recommended a CT coronary angiogram which demonstrated severe proximal LAD stenosis and the patient presents today for further discussion.  Past Medical History:  Diagnosis Date   Cancer ([place])    Hx RT breast infiltrated ductal CA  previously on tamoxifen and armidex    Herpes simplex    lt eye   Hypertension    Kidney stones    history   MVP (mitral valve prolapse)    asymptomatic   Personal history of chemotherapy    Personal history of radiation therapy     Past Surgical History:  Procedure Laterality Date   ABDOMINAL HYSTERECTOMY     partial   BREAST BIOPSY     BREAST LUMPECTOMY Right 2000   RT   CERVICAL LAMINECTOMY  1986   SHOULDER SURGERY     rotator cuff repair   throidectomy  1988   For cancer     Current Medications: Current Meds  Medication Sig   Ascorbic Acid (VITAMIN C) 100 MG tablet Take 100 mg by mouth daily.   aspirin EC 81 MG tablet Take 81 mg by mouth daily.   atorvastatin (LIPITOR) 80 MG tablet Take 1 tablet (80 mg total) by mouth daily.   cholecalciferol (VITAMIN D3) 25 MCG (1000 UNIT) tablet Take 4,000 Units by mouth daily.   clopidogrel (PLAVIX) 75 MG tablet Take 1 tablet (75 mg total) by mouth daily.   Coenzyme Q10 (COQ10 PO) Take 1 capsule by mouth daily.   Ferrous Sulfate (IRON PO) Take 1 capsule by mouth daily.   ibuprofen (ADVIL) 800 MG tablet TAKE ONE TABLET EVERY 8 HOURS AS NEEDED   levothyroxine (SYNTHROID) 75 MCG tablet TAKE ONE (1) TABLET BY MOUTH EACH DAY   metoprolol succinate (TOPROL-XL) 25 MG 24 hr tablet  TAKE ONE (1) TABLET BY MOUTH EVERY DAY   mupirocin ointment (BACTROBAN) 2 % Apply to inside of each nares daily for 10 days then twice a week for maintenance.   TURMERIC PO Take 1 capsule by mouth daily.   valACYclovir (VALTREX) 500 MG tablet TAKE ONE (1) TABLET BY MOUTH EACH DAY   Zinc Sulfate (ZINC 15 PO) Take 1 tablet by mouth daily.   [DISCONTINUED] losartan (COZAAR) 25 MG tablet Take 1 tablet (25 mg total) by mouth daily.     Allergies:   Streptomycin, Amlodipine, Codeine, Elemental sulfur, Penicillins, Sulfa antibiotics, Sulfonamide derivatives, Benicar [olmesartan], Hctz [hydrochlorothiazide], and Lisinopril   Social History   Socioeconomic History    Marital status: Married    Spouse name: Patrick Jupiter   Number of children: 1   Years of education: 12   Highest education level: 12th grade  Occupational History   Occupation: Working full-time at [employer].  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  Other Topics Concern   Not on file  Social History Narrative   Lives with her husband. She has one adopted daughter and two grandchildren. She has been working a lot but she enjoys Psychiatric nurse.    Social Determinants of Health   Financial Resource Strain: Low Risk  (04/12/2021)   Overall Financial Resource Strain (CARDIA)    Difficulty of Paying Living Expenses: Not hard at all  Food Insecurity: No Food Insecurity (04/12/2021)   Hunger Vital Sign    Worried About Running Out of Food in the Last Year: Never true    Ran Out of Food in the Last Year: Never true  Transportation Needs: No Transportation Needs (04/12/2021)   PRAPARE - Hydrologist (Medical): No    Lack of Transportation (Non-Medical): No  Physical Activity: Inactive (04/12/2021)   Exercise Vital Sign    Days of Exercise per Week: 0 days    Minutes of Exercise per Session: 0 min  Stress: No Stress Concern Present (04/12/2021)   [place]    Feeling of Stress : Only a little  Social Connections: Moderately Isolated (04/12/2021)   Social Connection and Isolation Panel [NHANES]    Frequency of Communication with Friends and Family: More than three times a week    Frequency of Social Gatherings with Friends and Family: Three times a week    Attends Religious Services: Never    Active Member of Clubs or Organizations: No     Attends Archivist Meetings: Never    Marital Status: Married     Family History: The patient's family history includes Breast cancer in her mother.  ROS:   Please see the history of present illness.    All other systems reviewed and are negative.  EKGs/Labs/Other Studies Reviewed:    The following studies were reviewed today: Echo 01/08/2022:  1. Mild diffuse hypokinesis with focal wall motion abnormalities noted.  Left ventricular ejection fraction, by estimation, is 40 to 45%. The left  ventricle has mildly decreased function. The left ventricle demonstrates  regional wall motion abnormalities   (see scoring diagram/findings for description). Left ventricular  diastolic function could not  be evaluated. There is moderate hypokinesis  of the left ventricular, mid-apical anteroseptal wall, anterior wall,  anterolateral wall and apical segment.   2. Right ventricular systolic function is normal. The right ventricular  size is normal. There is moderately elevated pulmonary artery systolic  pressure. The estimated right ventricular systolic pressure is 97.6 mmHg.   3. Left atrial size was moderately dilated.   4. Right atrial size was mildly dilated.   5. Moderate pericardial effusion. The pericardial effusion is  circumferential. There is no evidence of cardiac tamponade.   6. The mitral valve is abnormal. Mild to moderate mitral valve  regurgitation.   7. Tricuspid valve regurgitation is moderate.   8. The aortic valve is tricuspid. Aortic valve regurgitation is trivial.  No aortic stenosis is present.   9. The inferior vena cava is normal in size with greater than 50%  respiratory variability, suggesting right atrial pressure of 3 mmHg.   Comparison(s): Prior images unable to be directly viewed, comparison made  by report only. Changes from prior study are noted. Prior EF/wall motion  reported as normal.   Conclusion(s)/Recommendation(s): Globally reduced LVEF, with  additional  focal wall motion abnormalities apically (most prominent anteroseptal,  anterior, and anterolateral segments apex-mid region). Moderately elevated  PA pressures. MR and TR as noted.   CTA Heart: IMPRESSION: 1. Obstructive CAD, with mixed plaque in the proximal LAD causing severe (70-99%) stenosis. Cardiac catheterization recommended   2. Coronary calcium score of 300. This was 67th percentile for age and sex matched control.   3.  Normal coronary origins with left dominance   4. Artifact due to PVC during acquisition. With ECG editing, coronary arteries are interpretable except for distal LCX   5. Moderate pericardial effusion measuring up to 1.5cm adjacent to right atrium   6.  PFO   CAD-RADS 4 Severe stenosis. (70-99% or > 50% left main). Cardiac catheterization is recommended. Consider symptom-guided anti-ischemic pharmacotherapy as well as risk factor modification per guideline directed care.  EKG:  EKG is ordered today.  The ekg ordered today demonstrates normal sinus rhythm 67 bpm, occasional PVC, age-indeterminate anteroseptal infarct, T wave abnormality consider lateral ischemia.  Recent Labs: 11/05/2021: TSH 0.51 12/04/2021: ALT 17; BUN 25; Creat 0.78; Hemoglobin 13.9; Platelets 166; Potassium 4.1; Sodium 141  Recent Lipid Panel    Component Value Date/Time   CHOL 141 04/03/2021 0000   TRIG 46 04/03/2021 0000   TRIG 79 07/25/2009 0000   HDL 60 04/03/2021 0000   CHOLHDL 2.4 04/03/2021 0000   VLDL 13 04/16/2016 0958   LDLCALC 68 04/03/2021 0000     Risk Assessment/Calculations:           Physical Exam:    VS:  BP 104/62   Pulse 67   Ht 5' (1.524 m)   Wt 111 lb 6.4 oz (50.5 kg)   SpO2 95%   BMI 21.76 kg/m     Wt Readings from Last 3 Encounters:  02/03/22 111 lb 6.4 oz (50.5 kg)  01/15/22 110 lb (49.9 kg)  12/20/21 111 lb (50.3 kg)     GEN:  Well nourished, well developed in no acute distress HEENT: Normal NECK: No JVD; No carotid  bruits LYMPHATICS: No lymphadenopathy CARDIAC: RRR, no murmurs, rubs, gallops RESPIRATORY:  Clear to auscultation without rales, wheezing or rhonchi  ABDOMEN: Soft, non-tender, non-distended MUSCULOSKELETAL:  No edema; No deformity  SKIN: Warm and dry NEUROLOGIC:  Alert and oriented x 3 PSYCHIATRIC:  Normal affect   ASSESSMENT:  1. Pericardial effusion   2. Coronary artery disease involving native coronary artery of native heart without angina pectoris   3. Pre-procedural cardiovascular examination    PLAN:    In order of problems listed above:  This is demonstrated to be mild to moderate on echo and CT studies.  Considering her symptoms of fatigue and weakness, will assess further with invasive hemodynamics at the time of her cardiac catheterization (see discussion below).  Anticipate that this will require serial imaging. The patient has severe stenosis of the proximal LAD.  She has left dominant coronary anatomy with no significant left main disease.  EKG and echo studies suggest LAD territory ischemia or infarction with moderate hypokinesis of the apex, anterolateral wall, and anteroseptal walls.  RV function is normal but there is moderately elevated PA systolic pressure of 50 mmHg.  Reviewed treatment options.  Through a shared decision-making discussion, we decided on definitive evaluation with catheter angiography and possible PCI.  Again, I think the patient should have a right and left heart catheterization with findings of pulmonary hypertension and pericardial effusion on echo assessment.  She should have coronary angiography because of severe LAD stenosis demonstrated on CT coronary angiography.  I suspect she has myocardial viability with only hypokinesis demonstrated in the LAD territory with no description of any akinetic segments.  She will be started on clopidogrel 75 mg daily beginning today.  She will continue aspirin 81 mg daily.  She understands that there may be some  increased risk of further hematuria, but this risk is outweighed by needing to treat severe proximal LAD disease.  We will follow her closely with her response to clopidogrel which she will be started today. I have reviewed the risks, indications, and alternatives to cardiac catheterization, possible angioplasty, and stenting with the patient. Risks include but are not limited to bleeding, infection, vascular injury, stroke, myocardial infection, arrhythmia, kidney injury, radiation-related injury in the case of prolonged fluoroscopy use, emergency cardiac surgery, and death. The patient understands the risks of serious complication is 1-2 in 0017 with diagnostic cardiac cath and 1-2% or less with angioplasty/stenting.        Shared Decision Making/Informed Consent The risks [stroke (1 in 1000), death (1 in 1000), kidney failure [usually temporary] (1 in 500), bleeding (1 in 200), allergic reaction [possibly serious] (1 in 200)], benefits (diagnostic support and management of coronary artery disease) and alternatives of a cardiac catheterization were discussed in detail with Ms. Earleen Newport and she is willing to proceed.    Medication Adjustments/Labs and Tests Ordered: Current medicines are reviewed at length with the patient today.  Concerns regarding medicines are outlined above.  Orders Placed This Encounter  Procedures   CBC   Basic metabolic panel   EKG [id number]   Meds ordered this encounter  Medications   clopidogrel (PLAVIX) 75 MG tablet    Sig: Take 1 tablet (75 mg total) by mouth daily.    Dispense:  90 tablet    Refill:  3    Patient Instructions  Medication Instructions:  STOP Losartan START Plavix (Clopidogrel) '75mg'$  daily *If you need a refill on your cardiac medications before your next appointment, please call your pharmacy*   Lab Work: CBC, BMET today If you have labs (blood work) drawn today and your tests are completely normal, you will receive your results only  by: [facility] (if you have MyChart) OR A paper copy in the mail If you have any lab test that is abnormal or  we need to change your treatment, we will call you to review the results.   Testing/Procedures: L & R Heart catheterization Your physician has requested that you have a cardiac catheterization. Cardiac catheterization is used to diagnose and/or treat various heart conditions. Doctors may recommend this procedure for a number of different reasons. The most common reason is to evaluate chest pain. Chest pain can be a symptom of coronary artery disease (CAD), and cardiac catheterization can show whether plaque is narrowing or blocking your heart's arteries. This procedure is also used to evaluate the valves, as well as measure the blood flow and oxygen levels in different parts of your heart. For further information please visit [email or web address]. Please follow instruction sheet, as given.  Follow-Up: At [facility], you and your health needs are our priority.  As part of our continuing mission to provide you with exceptional heart care, we have created designated Provider Care Teams.  These Care Teams include your primary Cardiologist (physician) and Advanced Practice Providers (APPs -  Physician Assistants and Nurse Practitioners) who all work together to provide you with the care you need, when you need it.  We recommend signing up for the patient portal called "MyChart".  Sign up information is provided on this After Visit Summary.  MyChart is used to connect with patients for Virtual Visits (Telemedicine).  Patients are able to view lab/test results, encounter notes, upcoming appointments, etc.  Non-urgent messages can be sent to your provider as well.   To learn more about what you can do with MyChart, go to [email or web address].    Your next appointment:   1 year(s)  The format for your next appointment:   In Person  Provider:   Sherren Mocha, MD     Other  Instructions  [facility] [address] [place], [place] [address] Dept: [phone] Loc: [phone]  Adina Puzzo  02/03/2022  You are scheduled for a Cardiac Catheterization on Monday, July 24 with Dr. Sherren Mocha.  1. Please arrive at the Main Entrance A at [facility]: [place], [place] [address] at 5:30 AM (This time is two hours before your procedure to ensure your preparation). Free valet parking service is available.   Special note: Every effort is made to have your procedure done on time. Please understand that emergencies sometimes delay scheduled procedures.  2. Diet: Do not eat solid foods after midnight.  You may have clear liquids until 5 AM upon the day of the procedure.  3. Labs: TODAY  4. Medication instructions in preparation for your procedure:   Contrast Allergy: No  On the morning of your procedure, take Aspirin '81mg'$  and Plavix 75g and any morning medicines NOT listed above.  You may use sips of water.  5. Plan to go home the same day, you will only stay overnight if medically necessary. 6. You MUST have a responsible adult to drive you home. 7. An adult MUST be with you the first 24 hours after you arrive home. 8. Bring a current list of your medications, and the last time and date medication taken. 9. Bring ID and current insurance cards. 10.Please wear clothes that are easy to get on and off and wear slip-on shoes.  Thank you for allowing [place] to care for you!   -- [facility]   Important Information About Sugar         Signed, Legrand Como  Burt Knack, MD  02/03/2022 5:31 PM    [place]

## 2022-02-03 NOTE — Progress Notes (Signed)
Cardiology Office Note:    Date:  02/03/2022   ID:  Kathleen [name], DOB [birth year], MRN [id number]  PCP:  Hali Marry, MD   [facility] Providers Cardiologist:  Sherren Mocha, MD     Referring MD: Hali Marry, *   Chief Complaint  Patient presents with   Coronary Artery Disease    History of Present Illness:    Kathleen [name] is a 81 y.o. female returning for follow-up evaluation after a coronary CTA demonstrated high-grade proximal LAD stenosis.  The patient was initially seen in December 20, 2021 for evaluation of a pericardial effusion, newly diagnosed when the patient underwent a CT scan of the abdomen and pelvis to evaluate gross hematuria.  She did not have any major pathology found in kidneys or bladder.  She was treated for urinary tract infection with IV Rocephin.  She has had some very mild hematuria since that but no major problems reported.  She denies any chest pain or pressure.  However, she complains of marked fatigue and generalized weakness.  She had a fall in February of this year and has felt poorly ever since that time.  She does not ever recall having prolonged chest discomfort or any typical angina with exertion.  She does have mild shortness of breath with activity and clearly describes exercise intolerance with a functional class III limitation.  As part of her evaluation, she had an echocardiogram to reassess her pericardial effusion.  This demonstrated a moderate pericardial effusion but also showed LV dysfunction with LVEF 40 to 45% with both global and segmental regional wall motion abnormalities consistent with LAD territory infarction.  This correlated well with her EKG which showed an old septal infarct.  I recommended a CT coronary angiogram which demonstrated severe proximal LAD stenosis and the patient presents today for further discussion.  Past Medical History:  Diagnosis Date   Cancer ([place])    Hx RT breast infiltrated ductal CA  previously on tamoxifen and armidex    Herpes simplex    lt eye   Hypertension    Kidney stones    history   MVP (mitral valve prolapse)    asymptomatic   Personal history of chemotherapy    Personal history of radiation therapy     Past Surgical History:  Procedure Laterality Date   ABDOMINAL HYSTERECTOMY     partial   BREAST BIOPSY     BREAST LUMPECTOMY Right 2000   RT   CERVICAL LAMINECTOMY  1986   SHOULDER SURGERY     rotator cuff repair   throidectomy  1988   For cancer     Current Medications: Current Meds  Medication Sig   Ascorbic Acid (VITAMIN C) 100 MG tablet Take 100 mg by mouth daily.   aspirin EC 81 MG tablet Take 81 mg by mouth daily.   atorvastatin (LIPITOR) 80 MG tablet Take 1 tablet (80 mg total) by mouth daily.   cholecalciferol (VITAMIN D3) 25 MCG (1000 UNIT) tablet Take 4,000 Units by mouth daily.   clopidogrel (PLAVIX) 75 MG tablet Take 1 tablet (75 mg total) by mouth daily.   Coenzyme Q10 (COQ10 PO) Take 1 capsule by mouth daily.   Ferrous Sulfate (IRON PO) Take 1 capsule by mouth daily.   ibuprofen (ADVIL) 800 MG tablet TAKE ONE TABLET EVERY 8 HOURS AS NEEDED   levothyroxine (SYNTHROID) 75 MCG tablet TAKE ONE (1) TABLET BY MOUTH EACH DAY   metoprolol succinate (TOPROL-XL) 25 MG 24 hr tablet  TAKE ONE (1) TABLET BY MOUTH EVERY DAY   mupirocin ointment (BACTROBAN) 2 % Apply to inside of each nares daily for 10 days then twice a week for maintenance.   TURMERIC PO Take 1 capsule by mouth daily.   valACYclovir (VALTREX) 500 MG tablet TAKE ONE (1) TABLET BY MOUTH EACH DAY   Zinc Sulfate (ZINC 15 PO) Take 1 tablet by mouth daily.   [DISCONTINUED] losartan (COZAAR) 25 MG tablet Take 1 tablet (25 mg total) by mouth daily.     Allergies:   Streptomycin, Amlodipine, Codeine, Elemental sulfur, Penicillins, Sulfa antibiotics, Sulfonamide derivatives, Benicar [olmesartan], Hctz [hydrochlorothiazide], and Lisinopril   Social History   Socioeconomic History    Marital status: Married    Spouse name: Patrick Jupiter   Number of children: 1   Years of education: 12   Highest education level: 12th grade  Occupational History   Occupation: Working full-time at [employer].  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  Other Topics Concern   Not on file  Social History Narrative   Lives with her husband. She has one adopted daughter and two grandchildren. She has been working a lot but she enjoys Psychiatric nurse.    Social Determinants of Health   Financial Resource Strain: Low Risk  (04/12/2021)   Overall Financial Resource Strain (CARDIA)    Difficulty of Paying Living Expenses: Not hard at all  Food Insecurity: No Food Insecurity (04/12/2021)   Hunger Vital Sign    Worried About Running Out of Food in the Last Year: Never true    Ran Out of Food in the Last Year: Never true  Transportation Needs: No Transportation Needs (04/12/2021)   PRAPARE - Hydrologist (Medical): No    Lack of Transportation (Non-Medical): No  Physical Activity: Inactive (04/12/2021)   Exercise Vital Sign    Days of Exercise per Week: 0 days    Minutes of Exercise per Session: 0 min  Stress: No Stress Concern Present (04/12/2021)   [place]    Feeling of Stress : Only a little  Social Connections: Moderately Isolated (04/12/2021)   Social Connection and Isolation Panel [NHANES]    Frequency of Communication with Friends and Family: More than three times a week    Frequency of Social Gatherings with Friends and Family: Three times a week    Attends Religious Services: Never    Active Member of Clubs or Organizations: No     Attends Archivist Meetings: Never    Marital Status: Married     Family History: The patient's family history includes Breast cancer in her mother.  ROS:   Please see the history of present illness.    All other systems reviewed and are negative.  EKGs/Labs/Other Studies Reviewed:    The following studies were reviewed today: Echo 01/08/2022:  1. Mild diffuse hypokinesis with focal wall motion abnormalities noted.  Left ventricular ejection fraction, by estimation, is 40 to 45%. The left  ventricle has mildly decreased function. The left ventricle demonstrates  regional wall motion abnormalities   (see scoring diagram/findings for description). Left ventricular  diastolic function could not  be evaluated. There is moderate hypokinesis  of the left ventricular, mid-apical anteroseptal wall, anterior wall,  anterolateral wall and apical segment.   2. Right ventricular systolic function is normal. The right ventricular  size is normal. There is moderately elevated pulmonary artery systolic  pressure. The estimated right ventricular systolic pressure is 40.9 mmHg.   3. Left atrial size was moderately dilated.   4. Right atrial size was mildly dilated.   5. Moderate pericardial effusion. The pericardial effusion is  circumferential. There is no evidence of cardiac tamponade.   6. The mitral valve is abnormal. Mild to moderate mitral valve  regurgitation.   7. Tricuspid valve regurgitation is moderate.   8. The aortic valve is tricuspid. Aortic valve regurgitation is trivial.  No aortic stenosis is present.   9. The inferior vena cava is normal in size with greater than 50%  respiratory variability, suggesting right atrial pressure of 3 mmHg.   Comparison(s): Prior images unable to be directly viewed, comparison made  by report only. Changes from prior study are noted. Prior EF/wall motion  reported as normal.   Conclusion(s)/Recommendation(s): Globally reduced LVEF, with  additional  focal wall motion abnormalities apically (most prominent anteroseptal,  anterior, and anterolateral segments apex-mid region). Moderately elevated  PA pressures. MR and TR as noted.   CTA Heart: IMPRESSION: 1. Obstructive CAD, with mixed plaque in the proximal LAD causing severe (70-99%) stenosis. Cardiac catheterization recommended   2. Coronary calcium score of 300. This was 67th percentile for age and sex matched control.   3.  Normal coronary origins with left dominance   4. Artifact due to PVC during acquisition. With ECG editing, coronary arteries are interpretable except for distal LCX   5. Moderate pericardial effusion measuring up to 1.5cm adjacent to right atrium   6.  PFO   CAD-RADS 4 Severe stenosis. (70-99% or > 50% left main). Cardiac catheterization is recommended. Consider symptom-guided anti-ischemic pharmacotherapy as well as risk factor modification per guideline directed care.  EKG:  EKG is ordered today.  The ekg ordered today demonstrates normal sinus rhythm 67 bpm, occasional PVC, age-indeterminate anteroseptal infarct, T wave abnormality consider lateral ischemia.  Recent Labs: 11/05/2021: TSH 0.51 12/04/2021: ALT 17; BUN 25; Creat 0.78; Hemoglobin 13.9; Platelets 166; Potassium 4.1; Sodium 141  Recent Lipid Panel    Component Value Date/Time   CHOL 141 04/03/2021 0000   TRIG 46 04/03/2021 0000   TRIG 79 07/25/2009 0000   HDL 60 04/03/2021 0000   CHOLHDL 2.4 04/03/2021 0000   VLDL 13 04/16/2016 0958   LDLCALC 68 04/03/2021 0000     Risk Assessment/Calculations:           Physical Exam:    VS:  BP 104/62   Pulse 67   Ht 5' (1.524 m)   Wt 111 lb 6.4 oz (50.5 kg)   SpO2 95%   BMI 21.76 kg/m     Wt Readings from Last 3 Encounters:  02/03/22 111 lb 6.4 oz (50.5 kg)  01/15/22 110 lb (49.9 kg)  12/20/21 111 lb (50.3 kg)     GEN:  Well nourished, well developed in no acute distress HEENT: Normal NECK: No JVD; No carotid  bruits LYMPHATICS: No lymphadenopathy CARDIAC: RRR, no murmurs, rubs, gallops RESPIRATORY:  Clear to auscultation without rales, wheezing or rhonchi  ABDOMEN: Soft, non-tender, non-distended MUSCULOSKELETAL:  No edema; No deformity  SKIN: Warm and dry NEUROLOGIC:  Alert and oriented x 3 PSYCHIATRIC:  Normal affect   ASSESSMENT:  1. Pericardial effusion   2. Coronary artery disease involving native coronary artery of native heart without angina pectoris   3. Pre-procedural cardiovascular examination    PLAN:    In order of problems listed above:  This is demonstrated to be mild to moderate on echo and CT studies.  Considering her symptoms of fatigue and weakness, will assess further with invasive hemodynamics at the time of her cardiac catheterization (see discussion below).  Anticipate that this will require serial imaging. The patient has severe stenosis of the proximal LAD.  She has left dominant coronary anatomy with no significant left main disease.  EKG and echo studies suggest LAD territory ischemia or infarction with moderate hypokinesis of the apex, anterolateral wall, and anteroseptal walls.  RV function is normal but there is moderately elevated PA systolic pressure of 50 mmHg.  Reviewed treatment options.  Through a shared decision-making discussion, we decided on definitive evaluation with catheter angiography and possible PCI.  Again, I think the patient should have a right and left heart catheterization with findings of pulmonary hypertension and pericardial effusion on echo assessment.  She should have coronary angiography because of severe LAD stenosis demonstrated on CT coronary angiography.  I suspect she has myocardial viability with only hypokinesis demonstrated in the LAD territory with no description of any akinetic segments.  She will be started on clopidogrel 75 mg daily beginning today.  She will continue aspirin 81 mg daily.  She understands that there may be some  increased risk of further hematuria, but this risk is outweighed by needing to treat severe proximal LAD disease.  We will follow her closely with her response to clopidogrel which she will be started today. I have reviewed the risks, indications, and alternatives to cardiac catheterization, possible angioplasty, and stenting with the patient. Risks include but are not limited to bleeding, infection, vascular injury, stroke, myocardial infection, arrhythmia, kidney injury, radiation-related injury in the case of prolonged fluoroscopy use, emergency cardiac surgery, and death. The patient understands the risks of serious complication is 1-2 in 2637 with diagnostic cardiac cath and 1-2% or less with angioplasty/stenting.        Shared Decision Making/Informed Consent The risks [stroke (1 in 1000), death (1 in 1000), kidney failure [usually temporary] (1 in 500), bleeding (1 in 200), allergic reaction [possibly serious] (1 in 200)], benefits (diagnostic support and management of coronary artery disease) and alternatives of a cardiac catheterization were discussed in detail with Ms. Kathleen [name] and she is willing to proceed.    Medication Adjustments/Labs and Tests Ordered: Current medicines are reviewed at length with the patient today.  Concerns regarding medicines are outlined above.  Orders Placed This Encounter  Procedures   CBC   Basic metabolic panel   EKG 85-YIFO   Meds ordered this encounter  Medications   clopidogrel (PLAVIX) 75 MG tablet    Sig: Take 1 tablet (75 mg total) by mouth daily.    Dispense:  90 tablet    Refill:  3    Patient Instructions  Medication Instructions:  STOP Losartan START Plavix (Clopidogrel) '75mg'$  daily *If you need a refill on your cardiac medications before your next appointment, please call your pharmacy*   Lab Work: CBC, BMET today If you have labs (blood work) drawn today and your tests are completely normal, you will receive your results only  by: [place] (if you have MyChart) OR A paper copy in the mail If you have any lab test that is abnormal or  we need to change your treatment, we will call you to review the results.   Testing/Procedures: L & R Heart catheterization Your physician has requested that you have a cardiac catheterization. Cardiac catheterization is used to diagnose and/or treat various heart conditions. Doctors may recommend this procedure for a number of different reasons. The most common reason is to evaluate chest pain. Chest pain can be a symptom of coronary artery disease (CAD), and cardiac catheterization can show whether plaque is narrowing or blocking your heart's arteries. This procedure is also used to evaluate the valves, as well as measure the blood flow and oxygen levels in different parts of your heart. For further information please visit [email or web address]. Please follow instruction sheet, as given.  Follow-Up: At [facility], you and your health needs are our priority.  As part of our continuing mission to provide you with exceptional heart care, we have created designated Provider Care Teams.  These Care Teams include your primary Cardiologist (physician) and Advanced Practice Providers (APPs -  Physician Assistants and Nurse Practitioners) who all work together to provide you with the care you need, when you need it.  We recommend signing up for the patient portal called "MyChart".  Sign up information is provided on this After Visit Summary.  MyChart is used to connect with patients for Virtual Visits (Telemedicine).  Patients are able to view lab/test results, encounter notes, upcoming appointments, etc.  Non-urgent messages can be sent to your provider as well.   To learn more about what you can do with MyChart, go to [email or web address].    Your next appointment:   1 year(s)  The format for your next appointment:   In Person  Provider:   Sherren Mocha, MD     Other  Instructions  [place] OFFICE [address] [place], [place] [address] Dept: [phone] Loc: [phone]  Kathleen [name]  02/03/2022  You are scheduled for a Cardiac Catheterization on Monday, July 24 with Dr. Sherren Mocha.  1. Please arrive at the Main Entrance A at [facility]: [place], [place] [address] at 5:30 AM (This time is two hours before your procedure to ensure your preparation). Free valet parking service is available.   Special note: Every effort is made to have your procedure done on time. Please understand that emergencies sometimes delay scheduled procedures.  2. Diet: Do not eat solid foods after midnight.  You may have clear liquids until 5 AM upon the day of the procedure.  3. Labs: TODAY  4. Medication instructions in preparation for your procedure:   Contrast Allergy: No  On the morning of your procedure, take Aspirin '81mg'$  and Plavix 75g and any morning medicines NOT listed above.  You may use sips of water.  5. Plan to go home the same day, you will only stay overnight if medically necessary. 6. You MUST have a responsible adult to drive you home. 7. An adult MUST be with you the first 24 hours after you arrive home. 8. Bring a current list of your medications, and the last time and date medication taken. 9. Bring ID and current insurance cards. 10.Please wear clothes that are easy to get on and off and wear slip-on shoes.  Thank you for allowing [place] to care for you!   -- [facility]   Important Information About Sugar         Signed, Legrand Como  Burt Knack, MD  02/03/2022 5:31 PM    [place]

## 2022-02-04 LAB — BASIC METABOLIC PANEL
BUN/Creatinine Ratio: 38 — ABNORMAL HIGH (ref 12–28)
BUN: 34 mg/dL — ABNORMAL HIGH (ref 8–27)
CO2: 21 mmol/L (ref 20–29)
Calcium: 9.7 mg/dL (ref 8.7–10.3)
Chloride: 105 mmol/L (ref 96–106)
Creatinine, Ser: 0.89 mg/dL (ref 0.57–1.00)
Glucose: 92 mg/dL (ref 70–99)
Potassium: 4.5 mmol/L (ref 3.5–5.2)
Sodium: 145 mmol/L — ABNORMAL HIGH (ref 134–144)
eGFR: 65 mL/min/{1.73_m2} (ref >59)

## 2022-02-04 LAB — CBC
Hematocrit: 41.5 % (ref 34.0–46.6)
Hemoglobin: 13.6 g/dL (ref 11.1–15.9)
MCH: 30 pg (ref 26.6–33.0)
MCHC: 32.8 g/dL (ref 31.5–35.7)
MCV: 92 fL (ref 79–97)
Platelets: 178 10*3/uL (ref 150–450)
RBC: 4.53 x10E6/uL (ref 3.77–5.28)
RDW: 12.3 % (ref 11.7–15.4)
WBC: 8.5 10*3/uL (ref 3.4–10.8)

## 2022-02-05 ENCOUNTER — Other Ambulatory Visit: Payer: Self-pay | Admitting: Family Medicine

## 2022-02-05 ENCOUNTER — Encounter: Payer: Self-pay | Admitting: Cardiovascular Disease

## 2022-02-06 ENCOUNTER — Telehealth: Payer: Self-pay | Admitting: *Deleted

## 2022-02-06 NOTE — Telephone Encounter (Signed)
Reviewed procedure instructions with patient.  

## 2022-02-06 NOTE — Telephone Encounter (Signed)
Cardiac Catheterization scheduled at [facility] for: Monday February 10, 2022 7:30 AM Arrival time and place: [place] Entrance A at: 5:30 AM   Nothing to eat after midnight prior to procedure, clear liquids until 5 AM day of procedure.   Medication instructions: -Usual morning medications can be taken with sips of water including aspirin 81 mg and Plavix 75 mg  Confirmed patient has responsible adult to drive home post procedure and be with patient first 24 hours after arriving home.  Patient reports no new symptoms concerning for COVID-19 in the past 10 days.  Left message for patient to call back to review procedure instructions.

## 2022-02-06 NOTE — Telephone Encounter (Signed)
No answer, voicemail message. 

## 2022-02-07 ENCOUNTER — Telehealth: Payer: Self-pay | Admitting: Orthopedic Surgery

## 2022-02-07 NOTE — Telephone Encounter (Signed)
Patient called. She needs a note stating that she will be out of work for a few more weeks. Her call back number is [phone]

## 2022-02-07 NOTE — Telephone Encounter (Signed)
yes

## 2022-02-10 ENCOUNTER — Ambulatory Visit (HOSPITAL_COMMUNITY)
Admission: RE | Admit: 2022-02-10 | Discharge: 2022-02-11 | Disposition: A | Discharge status: Home / Self Care | Payer: Medicare HMO | Attending: Cardiovascular Disease | Admitting: Cardiovascular Disease

## 2022-02-10 ENCOUNTER — Other Ambulatory Visit: Payer: Self-pay

## 2022-02-10 ENCOUNTER — Ambulatory Visit (HOSPITAL_COMMUNITY)
Admission: RE | Disposition: A | Discharge status: Home / Self Care | Payer: Self-pay | Source: Home / Self Care | Attending: Cardiovascular Disease

## 2022-02-10 DIAGNOSIS — Z7982 Long term (current) use of aspirin: Secondary | ICD-10-CM | POA: Diagnosis not present

## 2022-02-10 DIAGNOSIS — I2510 Atherosclerotic heart disease of native coronary artery without angina pectoris: Secondary | ICD-10-CM | POA: Insufficient documentation

## 2022-02-10 DIAGNOSIS — I10 Essential (primary) hypertension: Secondary | ICD-10-CM | POA: Insufficient documentation

## 2022-02-10 DIAGNOSIS — Z7902 Long term (current) use of antithrombotics/antiplatelets: Secondary | ICD-10-CM | POA: Insufficient documentation

## 2022-02-10 DIAGNOSIS — R931 Abnormal findings on diagnostic imaging of heart and coronary circulation: Secondary | ICD-10-CM | POA: Diagnosis present

## 2022-02-10 DIAGNOSIS — Z955 Presence of coronary angioplasty implant and graft: Secondary | ICD-10-CM | POA: Diagnosis not present

## 2022-02-10 DIAGNOSIS — I2584 Coronary atherosclerosis due to calcified coronary lesion: Secondary | ICD-10-CM | POA: Diagnosis not present

## 2022-02-10 DIAGNOSIS — E039 Hypothyroidism, unspecified: Secondary | ICD-10-CM | POA: Insufficient documentation

## 2022-02-10 DIAGNOSIS — I3139 Other pericardial effusion (noninflammatory): Secondary | ICD-10-CM | POA: Diagnosis not present

## 2022-02-10 HISTORY — PX: CORONARY STENT INTERVENTION: CATH118234

## 2022-02-10 HISTORY — PX: RIGHT/LEFT HEART CATH AND CORONARY ANGIOGRAPHY: CATH118266

## 2022-02-10 LAB — POCT I-STAT EG7
Acid-Base Excess: 1 mmol/L (ref 0.0–2.0)
Bicarbonate: 26.6 mmol/L (ref 20.0–28.0)
Calcium, Ion: 1.24 mmol/L (ref 1.15–1.40)
HCT: 38 % (ref 36.0–46.0)
Hemoglobin: 12.9 g/dL (ref 12.0–15.0)
O2 Saturation: 67 %
Potassium: 3.7 mmol/L (ref 3.5–5.1)
Sodium: 144 mmol/L (ref 135–145)
TCO2: 28 mmol/L (ref 22–32)
pCO2, Ven: 44 mmHg (ref 44–60)
pH, Ven: 7.389 (ref 7.25–7.43)
pO2, Ven: 36 mmHg (ref 32–45)

## 2022-02-10 LAB — POCT I-STAT 7, (LYTES, BLD GAS, ICA,H+H)
Acid-Base Excess: 0 mmol/L (ref 0.0–2.0)
Bicarbonate: 25.3 mmol/L (ref 20.0–28.0)
Calcium, Ion: 1.24 mmol/L (ref 1.15–1.40)
HCT: 37 % (ref 36.0–46.0)
Hemoglobin: 12.6 g/dL (ref 12.0–15.0)
O2 Saturation: 94 %
Potassium: 3.6 mmol/L (ref 3.5–5.1)
Sodium: 143 mmol/L (ref 135–145)
TCO2: 27 mmol/L (ref 22–32)
pCO2 arterial: 41 mmHg (ref 32–48)
pH, Arterial: 7.398 (ref 7.35–7.45)
pO2, Arterial: 71 mmHg — ABNORMAL LOW (ref 83–108)

## 2022-02-10 LAB — POCT ACTIVATED CLOTTING TIME
Activated Clotting Time: 257 seconds
Activated Clotting Time: 275 seconds

## 2022-02-10 SURGERY — RIGHT/LEFT HEART CATH AND CORONARY ANGIOGRAPHY
Anesthesia: LOCAL

## 2022-02-10 MED ORDER — SODIUM CHLORIDE 0.9 % IV SOLN
INTRAVENOUS | Status: AC | PRN
  Administered 2022-02-10: 250 mL via INTRAVENOUS

## 2022-02-10 MED ORDER — HYDRALAZINE HCL 20 MG/ML IJ SOLN
INTRAMUSCULAR | Status: AC
  Filled 2022-02-10: qty 1

## 2022-02-10 MED ORDER — ATORVASTATIN CALCIUM 80 MG PO TABS
80.0000 mg | ORAL_TABLET | Freq: Every day | ORAL | Status: DC
  Administered 2022-02-10 – 2022-02-11 (×2): 80 mg via ORAL
  Filled 2022-02-10 (×3): qty 1

## 2022-02-10 MED ORDER — LEVOTHYROXINE SODIUM 75 MCG PO TABS
75.0000 ug | ORAL_TABLET | Freq: Every day | ORAL | Status: DC
  Administered 2022-02-11: 75 ug via ORAL
  Filled 2022-02-10: qty 1

## 2022-02-10 MED ORDER — CLOPIDOGREL BISULFATE 75 MG PO TABS: 75.0000 mg | ORAL_TABLET | ORAL | Status: DC

## 2022-02-10 MED ORDER — NITROGLYCERIN 1 MG/10 ML FOR IR/CATH LAB
INTRA_ARTERIAL | Status: AC
  Filled 2022-02-10: qty 10

## 2022-02-10 MED ORDER — ASPIRIN 81 MG PO CHEW: 81.0000 mg | CHEWABLE_TABLET | ORAL | Status: DC

## 2022-02-10 MED ORDER — IOHEXOL 350 MG/ML SOLN
INTRAVENOUS | Status: DC | PRN
  Administered 2022-02-10: 80 mL

## 2022-02-10 MED ORDER — SODIUM CHLORIDE 0.9 % WEIGHT BASED INFUSION
1.0000 mL/kg/h | INTRAVENOUS | Status: AC
  Administered 2022-02-10: 1 mL/kg/h via INTRAVENOUS

## 2022-02-10 MED ORDER — VERAPAMIL HCL 2.5 MG/ML IV SOLN
INTRAVENOUS | Status: AC
  Filled 2022-02-10: qty 2

## 2022-02-10 MED ORDER — SODIUM CHLORIDE 0.9 % IV SOLN: 250.0000 mL | INTRAVENOUS | Status: DC | PRN

## 2022-02-10 MED ORDER — MIDAZOLAM HCL 2 MG/2ML IJ SOLN
INTRAMUSCULAR | Status: AC
  Filled 2022-02-10: qty 2

## 2022-02-10 MED ORDER — ACETAMINOPHEN 325 MG PO TABS: 650.0000 mg | ORAL_TABLET | ORAL | Status: DC | PRN

## 2022-02-10 MED ORDER — CLOPIDOGREL BISULFATE 75 MG PO TABS
75.0000 mg | ORAL_TABLET | Freq: Every day | ORAL | Status: DC
  Administered 2022-02-11: 75 mg via ORAL
  Filled 2022-02-10: qty 1

## 2022-02-10 MED ORDER — LIDOCAINE HCL (PF) 1 % IJ SOLN
INTRAMUSCULAR | Status: DC | PRN
  Administered 2022-02-10: 5 mL
  Administered 2022-02-10: 10 mL

## 2022-02-10 MED ORDER — HEPARIN SODIUM (PORCINE) 1000 UNIT/ML IJ SOLN
INTRAMUSCULAR | Status: AC
  Filled 2022-02-10: qty 10

## 2022-02-10 MED ORDER — SODIUM CHLORIDE 0.9 % WEIGHT BASED INFUSION
3.0000 mL/kg/h | INTRAVENOUS | Status: DC
  Administered 2022-02-10: 3 mL/kg/h via INTRAVENOUS

## 2022-02-10 MED ORDER — SODIUM CHLORIDE 0.9% FLUSH: 3.0000 mL | INTRAVENOUS | Status: DC | PRN

## 2022-02-10 MED ORDER — METOPROLOL SUCCINATE ER 25 MG PO TB24
25.0000 mg | ORAL_TABLET | Freq: Every day | ORAL | Status: DC
  Administered 2022-02-10 – 2022-02-11 (×2): 25 mg via ORAL
  Filled 2022-02-10 (×3): qty 1

## 2022-02-10 MED ORDER — LABETALOL HCL 5 MG/ML IV SOLN: 10.0000 mg | INTRAVENOUS | Status: DC | PRN

## 2022-02-10 MED ORDER — SODIUM CHLORIDE 0.9% FLUSH
3.0000 mL | Freq: Two times a day (BID) | INTRAVENOUS | Status: DC
  Administered 2022-02-10 – 2022-02-11 (×2): 3 mL via INTRAVENOUS

## 2022-02-10 MED ORDER — HYDRALAZINE HCL 20 MG/ML IJ SOLN
10.0000 mg | INTRAMUSCULAR | Status: DC | PRN
  Administered 2022-02-10: 10 mg via INTRAVENOUS

## 2022-02-10 MED ORDER — FENTANYL CITRATE (PF) 100 MCG/2ML IJ SOLN
INTRAMUSCULAR | Status: DC | PRN
  Administered 2022-02-10 (×2): 25 ug via INTRAVENOUS

## 2022-02-10 MED ORDER — NITROGLYCERIN 1 MG/10 ML FOR IR/CATH LAB
INTRA_ARTERIAL | Status: DC | PRN
  Administered 2022-02-10: 150 ug
  Administered 2022-02-10: 100 ug

## 2022-02-10 MED ORDER — MIDAZOLAM HCL 2 MG/2ML IJ SOLN
INTRAMUSCULAR | Status: DC | PRN
  Administered 2022-02-10 (×2): 1 mg via INTRAVENOUS

## 2022-02-10 MED ORDER — HEPARIN SODIUM (PORCINE) 1000 UNIT/ML IJ SOLN
INTRAMUSCULAR | Status: DC | PRN
  Administered 2022-02-10: 4000 [IU] via INTRAVENOUS
  Administered 2022-02-10: 1500 [IU] via INTRAVENOUS

## 2022-02-10 MED ORDER — SODIUM CHLORIDE 0.9 % WEIGHT BASED INFUSION: 1.0000 mL/kg/h | INTRAVENOUS | Status: DC

## 2022-02-10 MED ORDER — FENTANYL CITRATE (PF) 100 MCG/2ML IJ SOLN
INTRAMUSCULAR | Status: AC
  Filled 2022-02-10: qty 2

## 2022-02-10 MED ORDER — HEPARIN (PORCINE) IN NACL 1000-0.9 UT/500ML-% IV SOLN
INTRAVENOUS | Status: DC | PRN
  Administered 2022-02-10 (×2): 500 mL

## 2022-02-10 MED ORDER — ASPIRIN 81 MG PO TBEC
81.0000 mg | DELAYED_RELEASE_TABLET | Freq: Every day | ORAL | Status: DC
  Administered 2022-02-11: 81 mg via ORAL
  Filled 2022-02-10 (×2): qty 1

## 2022-02-10 MED ORDER — SODIUM CHLORIDE 0.9% FLUSH
3.0000 mL | Freq: Two times a day (BID) | INTRAVENOUS | Status: DC
  Administered 2022-02-11 (×2): 3 mL via INTRAVENOUS

## 2022-02-10 MED ORDER — ONDANSETRON HCL 4 MG/2ML IJ SOLN: 4.0000 mg | Freq: Four times a day (QID) | INTRAMUSCULAR | Status: DC | PRN

## 2022-02-10 MED ORDER — LIDOCAINE HCL (PF) 1 % IJ SOLN
INTRAMUSCULAR | Status: AC
  Filled 2022-02-10: qty 30

## 2022-02-10 SURGICAL SUPPLY — 27 items
BALL SAPPHIRE NC24 2.75X22 (BALLOONS) ×2
BALLN SAPPHIRE 2.0X12 (BALLOONS) ×2
BALLOON SAPPHIRE 2.0X12 (BALLOONS) IMPLANT
BALLOON SAPPHIRE NC24 2.75X22 (BALLOONS) IMPLANT
CATH INFINITI 5FR MULTPACK ANG (CATHETERS) ×1 IMPLANT
CATH SWAN GANZ 7F STRAIGHT (CATHETERS) ×1 IMPLANT
CATH VISTA GUIDE 6FR XBLAD3.5 (CATHETERS) ×1 IMPLANT
CLOSURE PERCLOSE PROSTYLE (VASCULAR PRODUCTS) ×2 IMPLANT
GLIDESHEATH SLEND SS 6F .021 (SHEATH) ×1 IMPLANT
GUIDEWIRE INQWIRE 1.5J.035X260 (WIRE) IMPLANT
INQWIRE 1.5J .035X260CM (WIRE) ×2
KIT ENCORE 26 ADVANTAGE (KITS) ×1 IMPLANT
KIT HEART LEFT (KITS) ×2 IMPLANT
KIT MICROPUNCTURE NIT STIFF (SHEATH) ×1 IMPLANT
PACK CARDIAC CATHETERIZATION (CUSTOM PROCEDURE TRAY) ×2 IMPLANT
SHEATH GLIDE SLENDER 4/5FR (SHEATH) ×1 IMPLANT
SHEATH PINNACLE 5F 10CM (SHEATH) ×1 IMPLANT
SHEATH PINNACLE 6F 10CM (SHEATH) ×1 IMPLANT
SHEATH PINNACLE 7F 10CM (SHEATH) ×1 IMPLANT
SHEATH PROBE COVER 6X72 (BAG) ×1 IMPLANT
STENT SYNERGY XD 2.50X32 (Permanent Stent) IMPLANT
SYNERGY XD 2.50X32 (Permanent Stent) ×2 IMPLANT
TRANSDUCER W/STOPCOCK (MISCELLANEOUS) ×2 IMPLANT
TUBING CIL FLEX 10 FLL-RA (TUBING) ×2 IMPLANT
VALVE GUARDIAN II ~~LOC~~ HEMO (MISCELLANEOUS) ×1 IMPLANT
WIRE COUGAR XT STRL 190CM (WIRE) ×1 IMPLANT
WIRE EMERALD 3MM-J .035X150CM (WIRE) ×1 IMPLANT

## 2022-02-10 NOTE — Telephone Encounter (Signed)
Tried calling to advise done. No answer and no VM picked up to LM.

## 2022-02-10 NOTE — Progress Notes (Signed)
Purewick placed, tolerated well, safety maintained ° °

## 2022-02-10 NOTE — Care Management (Signed)
  Transition of Care [facility]) Screening Note   Patient Details  Name: Kathleen [name] Date of Birth: 1940/08/21   Transition of Care [facility]) CM/SW Contact:    Bethena Roys, RN Phone Number: 02/10/2022, 4:33 PM    Transition of Care Department [facility]) has reviewed the patient and no TOC needs have been identified at this time. We will continue to monitor patient advancement through interdisciplinary progression rounds. If new patient transition needs arise, please place a TOC consult.

## 2022-02-10 NOTE — Interval H&P Note (Signed)
Cath Lab Visit (complete for each Cath Lab visit)  Clinical Evaluation Leading to the Procedure:   ACS: No.  Non-ACS:    Anginal Classification: CCS II  Anti-ischemic medical therapy: Minimal Therapy (1 class of medications)  Non-Invasive Test Results: Intermediate-risk stress test findings: cardiac mortality 1-3%/year  Prior CABG: No previous CABG      History and Physical Interval Note:  02/10/2022 7:42 AM  Kathleen [name]  has presented today for surgery, with the diagnosis of LAD stenosis.  The various methods of treatment have been discussed with the patient and family. After consideration of risks, benefits and other options for treatment, the patient has consented to  Procedure(s): RIGHT/LEFT HEART CATH AND CORONARY ANGIOGRAPHY (N/A) as a surgical intervention.  The patient's history has been reviewed, patient examined, no change in status, stable for surgery.  I have reviewed the patient's chart and labs.  Questions were answered to the patient's satisfaction.     Sherren Mocha

## 2022-02-11 ENCOUNTER — Encounter (HOSPITAL_COMMUNITY): Payer: Self-pay | Admitting: Cardiovascular Disease

## 2022-02-11 DIAGNOSIS — Z7902 Long term (current) use of antithrombotics/antiplatelets: Secondary | ICD-10-CM | POA: Diagnosis not present

## 2022-02-11 DIAGNOSIS — I2584 Coronary atherosclerosis due to calcified coronary lesion: Secondary | ICD-10-CM | POA: Diagnosis not present

## 2022-02-11 DIAGNOSIS — I10 Essential (primary) hypertension: Secondary | ICD-10-CM | POA: Diagnosis not present

## 2022-02-11 DIAGNOSIS — R931 Abnormal findings on diagnostic imaging of heart and coronary circulation: Secondary | ICD-10-CM | POA: Diagnosis not present

## 2022-02-11 DIAGNOSIS — E039 Hypothyroidism, unspecified: Secondary | ICD-10-CM | POA: Diagnosis not present

## 2022-02-11 DIAGNOSIS — I2510 Atherosclerotic heart disease of native coronary artery without angina pectoris: Secondary | ICD-10-CM | POA: Diagnosis not present

## 2022-02-11 DIAGNOSIS — Z7982 Long term (current) use of aspirin: Secondary | ICD-10-CM | POA: Diagnosis not present

## 2022-02-11 DIAGNOSIS — I3139 Other pericardial effusion (noninflammatory): Secondary | ICD-10-CM | POA: Diagnosis not present

## 2022-02-11 DIAGNOSIS — Z955 Presence of coronary angioplasty implant and graft: Secondary | ICD-10-CM | POA: Diagnosis not present

## 2022-02-11 LAB — BASIC METABOLIC PANEL
Anion gap: 8 (ref 5–15)
BUN: 25 mg/dL — ABNORMAL HIGH (ref 8–23)
CO2: 26 mmol/L (ref 22–32)
Calcium: 9.1 mg/dL (ref 8.9–10.3)
Chloride: 108 mmol/L (ref 98–111)
Creatinine, Ser: 0.79 mg/dL (ref 0.44–1.00)
GFR, Estimated: >60 mL/min (ref >60)
Glucose, Bld: 100 mg/dL — ABNORMAL HIGH (ref 70–99)
Potassium: 3.4 mmol/L — ABNORMAL LOW (ref 3.5–5.1)
Sodium: 142 mmol/L (ref 135–145)

## 2022-02-11 LAB — CBC
HCT: 38.3 % (ref 36.0–46.0)
Hemoglobin: 12.8 g/dL (ref 12.0–15.0)
MCH: 30.5 pg (ref 26.0–34.0)
MCHC: 33.4 g/dL (ref 30.0–36.0)
MCV: 91.4 fL (ref 80.0–100.0)
Platelets: 149 10*3/uL — ABNORMAL LOW (ref 150–400)
RBC: 4.19 MIL/uL (ref 3.87–5.11)
RDW: 13.1 % (ref 11.5–15.5)
WBC: 8.4 10*3/uL (ref 4.0–10.5)
nRBC: 0 % (ref 0.0–0.2)

## 2022-02-11 MED ORDER — POTASSIUM CHLORIDE CRYS ER 20 MEQ PO TBCR
20.0000 meq | EXTENDED_RELEASE_TABLET | Freq: Once | ORAL | Status: AC
Start: 2022-02-11 — End: 2022-02-11
  Administered 2022-02-11: 20 meq via ORAL
  Filled 2022-02-11 (×2): qty 1

## 2022-02-11 MED FILL — Verapamil HCl IV Soln 2.5 MG/ML: INTRAVENOUS | Qty: 2 | Status: AC

## 2022-02-11 NOTE — Progress Notes (Signed)
Pt safely discharged. Discharge packet provided with teach-back method. VS wnL and as per flow. IVs removed, Pt verbalized understanding. All questions and concerns addressed. Awaiting on husband for transport.

## 2022-02-11 NOTE — Progress Notes (Addendum)
CARDIAC REHAB PHASE I   PRE:  Rate/Rhythm: 72 SR  BP:  Sitting: 152/62      SaO2: 99 RA  MODE:  Ambulation: 225 ft   POST:  Rate/Rhythm: 72 SR  BP:  Sitting: 132/57      SaO2: 99 RA  Pt tolerated walk well. Feels some generalized weakness from being in bed so long. No CP or SOB. Back to room in chair. Home education including site care, restrictions heart healthy diet, asa/Plavix medication importance, exercise guidelines, and CRP2. Pt verbalized understanding of education. Plans for home later today. CRP2 order placed.   [id number]    Vanessa Barbara, RN BSN 02/11/2022 9:40 AM

## 2022-02-11 NOTE — Discharge Instructions (Signed)

## 2022-02-11 NOTE — Discharge Summary (Signed)
Discharge Summary    Patient ID: Kathleen [name] MRN: [id number]; DOB: [birth year]  Admit date: 02/10/2022 Discharge date: 02/11/2022  PCP:  Hali Marry, MD   [facility] Providers Cardiologist:  Sherren Mocha, MD     Discharge Diagnoses    Principal Problem:   Abnormal cardiac CT angiography Active Problems:   Hypothyroidism   Essential hypertension  Diagnostic Studies/Procedures    Cath: 02/10/22    Ramus lesion is 40% stenosed.   Prox LAD to Mid LAD lesion is 95% stenosed.   Mid LAD to Dist LAD lesion is 50% stenosed.   A drug-eluting stent was successfully placed using a SYNERGY XD 2.50X32.   Post intervention, there is a 0% residual stenosis.   1.  Severe proximal LAD stenosis, treated successfully with PCI using a 2.5 x 32 mm Synergy DES 2.  Widely patent, dominant left circumflex with no significant stenosis 3.  Patent, nondominant RCA (small vessel) 4.  Essentially normal right heart hemodynamics, normal LVEDP, normal wedge pressure, with no hemodynamic evidence of cardiac tamponade  Recommendations: Aspirin and clopidogrel at least 6 months, aggressive risk reduction measures, overnight observation with plans for discharge tomorrow as long as no complications arise.  Diagnostic Dominance: Left  Intervention   _____________   History of Present Illness     Kathleen [name] is a 81 y.o. female with past medical history of hypertension, breast CA, and mitral valve prolapse who was recently seen in the office.  She was initially seen 12/20/2021 for evaluation of pericardial effusion which was newly diagnosed after patient underwent a CT scan of the abdomen and pelvis to evaluate for hematuria.  She had an echocardiogram to assess her pericardial effusion which showed a moderate pericardial effusion with LVEF of 40 to 45% with both global and segmental regional wall motion abnormalities consistent in the LAD territory.  Noted to have normal RV function but also  moderately elevated PA systolic pressure of 50 mmHg. This correlated with her EKG which showed an old septal infarct.  It was recommended that she undergo a CT coronary angiogram which demonstrated severe proximal LAD stenosis and cardiac catheterization was recommended.  She was continued on aspirin and started on Plavix 75 mg daily in preparation for cardiac catheterization.  Hospital Course     Underwent cardiac catheterization noted above with severe proximal LAD stenosis treated with PCI/DES x1.  Widely patent dominant left circumflex with no significant stenosis as well as patent nondominant RCA.  Essentially normal right heart pressures with normal LVEDP wedge pressure and no evidence of cardiac tamponade.  She will be continued on DAPT with aspirin/Plavix for at least 6 months. She needs left shoulder surgery, but instructed she will need to remain on DAPT for at least 6 months. No complications noted post cath. Able to ambulate with cardiac rehab, no chest pain.   General: Well developed, well nourished, female appearing in no acute distress. Head: Normocephalic, atraumatic.  Neck: Supple without bruits, JVD. Lungs:  Resp regular and unlabored, CTA. Heart: RRR, S1, S2, no S3, S4, or murmur; no rub. Abdomen: Soft, non-tender, non-distended with normoactive bowel sounds. No hepatomegaly. No rebound/guarding. No obvious abdominal masses. Extremities: No clubbing, cyanosis, edema. Distal pedal pulses are 2+ bilaterally. Right femoral cath site stable without bruising or hematoma Neuro: Alert and oriented X 3. Moves all extremities spontaneously. Psych: Normal affect.  Did the patient have an acute coronary syndrome (MI, NSTEMI, STEMI, etc) this admission?:  No  Did the patient have a percutaneous coronary intervention (stent / angioplasty)?:  Yes.     Cath/PCI Registry Performance & Quality Measures: Aspirin prescribed? - Yes ADP Receptor Inhibitor  (Plavix/Clopidogrel, Brilinta/Ticagrelor or Effient/Prasugrel) prescribed (includes medically managed patients)? - Yes High Intensity Statin (Lipitor 40-66m or Crestor 20-444m prescribed? - Yes For EF <40%, was ACEI/ARB prescribed? - Not Applicable (EF >/= 4060%For EF <40%, Aldosterone Antagonist (Spironolactone or Eplerenone) prescribed? - Not Applicable (EF >/= 4060%Cardiac Rehab Phase II ordered? - Yes       The patient will be scheduled for a TOC follow up appointment in 10-4 days.  A [facility] at the office where the patient should be seen for follow up.  _____________  Discharge Vitals Blood pressure (!) 132/57, pulse 74, temperature 97.7 F (36.5 C), temperature source Oral, resp. rate 17, height 5' (1.524 m), weight 48.5 kg, SpO2 99 %.  Filed Weights   02/10/22 0534  Weight: 48.5 kg    Labs & Radiologic Studies    CBC Recent Labs    02/10/22 0811 02/11/22 0157  WBC  --  8.4  HGB 12.9 12.8  HCT 38.0 38.3  MCV  --  91.4  PLT  --  14045  Basic Metabolic Panel Recent Labs    02/10/22 0811 02/11/22 0157  NA 144 142  K 3.7 3.4*  CL  --  108  CO2  --  26  GLUCOSE  --  100*  BUN  --  25*  CREATININE  --  0.79  CALCIUM  --  9.1   Liver Function Tests No results for input(s): "AST", "ALT", "ALKPHOS", "BILITOT", "PROT", "ALBUMIN" in the last 72 hours. No results for input(s): "LIPASE", "AMYLASE" in the last 72 hours. High Sensitivity Troponin:   No results for input(s): "TROPONINIHS" in the last 720 hours.  BNP Invalid input(s): "POCBNP" D-Dimer No results for input(s): "DDIMER" in the last 72 hours. Hemoglobin A1C No results for input(s): "HGBA1C" in the last 72 hours. Fasting Lipid Panel No results for input(s): "CHOL", "HDL", "LDLCALC", "TRIG", "CHOLHDL", "LDLDIRECT" in the last 72 hours. Thyroid Function Tests No results for input(s): "TSH", "T4TOTAL", "T3FREE", "THYROIDAB" in the last 72 hours.  Invalid  input(s): "FREET3" _____________  CARDIAC CATHETERIZATION  Result Date: 02/10/2022   Ramus lesion is 40% stenosed.   Prox LAD to Mid LAD lesion is 95% stenosed.   Mid LAD to Dist LAD lesion is 50% stenosed.   A drug-eluting stent was successfully placed using a SYNERGY XD 2.50X32.   Post intervention, there is a 0% residual stenosis. 1.  Severe proximal LAD stenosis, treated successfully with PCI using a 2.5 x 32 mm Synergy DES 2.  Widely patent, dominant left circumflex with no significant stenosis 3.  Patent, nondominant RCA (small vessel) 4.  Essentially normal right heart hemodynamics, normal LVEDP, normal wedge pressure, with no hemodynamic evidence of cardiac tamponade Recommendations: Aspirin and clopidogrel at least 6 months, aggressive risk reduction measures, overnight observation with plans for discharge tomorrow as long as no complications arise.   CT CORONARY MORPH W/CTA COR W/SCORE W/CA W/CM &/OR WO/CM  Addendum Date: 01/28/2022   ADDENDUM REPORT: 01/28/2022 16:27 ADDENDUM: OVER-READ INTERPRETATION  CT CHEST The following report is an over-read performed by radiologist Dr. [facility] on 01/28/2022. This over-read does not include interpretation of cardiac or coronary anatomy or pathology. The coronary calcium score and coronary CT angiography. Interpretation by  the cardiologist is attached. Imaging of the chest is focused on cardiac structures and excludes much of the chest on CT. COMPARISON: None. FINDINGS: Cardiovascular: Signs of pericardial effusion, small to moderate. Aortic atherosclerosis. See dedicated cardiac imaging report for additional details. Mediastinum/Nodes: No acute process or signs of adenopathy in the mediastinum. Lungs/Pleura: Basilar atelectasis. No effusion. No consolidation. Airways to the extent visualized are patent. Upper Abdomen: No acute process in the upper abdomen. Musculoskeletal: Spinal degenerative changes without acute or  destructive bone finding. IMPRESSION: 1. Signs of pericardial effusion, small to moderate. 2. Aortic atherosclerosis. Aortic Atherosclerosis (ICD10-I70.0). Electronically Signed   By: Zetta Bills M.D.   On: 01/28/2022 16:27   Result Date: 01/28/2022 CLINICAL DATA:  70W with systolic dysfunction on echo (EF 40-45%) EXAM: Cardiac/Coronary CTA TECHNIQUE: The patient was scanned on a [employer]. FINDINGS: A 100 kV prospective scan was triggered in the descending thoracic aorta at 111 HU's. Axial non-contrast 3 mm slices were carried out through the heart. The data set was analyzed on a dedicated work station and scored using the Capron. Gantry rotation speed was 250 msecs and collimation was .6 mm. 0.8 mg of sl NTG was given. The 3D data set was reconstructed in 5% intervals of the 35-75 % of the R-R cycle. Phases were analyzed on a dedicated work station using MPR, MIP and VRT modes. The patient received 80 cc of contrast. Coronary Arteries:  Normal coronary origin.  Left dominance. RCA is small and nondominant Left main is a large artery that gives rise to LAD and LCX arteries. LAD is a large vessel. Mixed plaque in proximal LAD causes severe (70-99%) stenosis. LCX is a dominant artery that gives rise to one large OM1 branch. Calcified plaque in proximal LCX causes 0-24% stenosis. Calcified plaque in OM1 causes 0-24% stenosis. Distal LCX uninterpretable due to artifact from PVC during acquisition. Other findings: Left Ventricle: Normal size Left Atrium: Normal size. PFO Pulmonary Veins: Normal configuration Right Ventricle: Normal size Right Atrium: Normal size Cardiac valves: Mild MAC Thoracic aorta: Normal size Pulmonary Arteries: Normal size Systemic Veins: Normal drainage Pericardium: Moderate effusion measuring up to 1.5cm adjacent to right atrium IMPRESSION: 1. Obstructive CAD, with mixed plaque in the proximal LAD causing severe (70-99%) stenosis. Cardiac catheterization recommended 2.  Coronary calcium score of 300. This was 67th percentile for age and sex matched control. 3.  Normal coronary origins with left dominance 4. Artifact due to PVC during acquisition. With ECG editing, coronary arteries are interpretable except for distal LCX 5. Moderate pericardial effusion measuring up to 1.5cm adjacent to right atrium 6.  PFO CAD-RADS 4 Severe stenosis. (70-99% or > 50% left main). Cardiac catheterization is recommended. Consider symptom-guided anti-ischemic pharmacotherapy as well as risk factor modification per guideline directed care. Electronically Signed: By: Oswaldo Milian M.D. On: 01/28/2022 16:03    Disposition   Pt is being discharged home today in good condition.  Follow-up Plans & Appointments     Follow-up Information     Emmaline Life, NP Follow up on 02/18/2022.   Specialty: Nurse Practitioner Why: at 10:50am for your follow up appt with Dr. York Cerise' NP Natasha Mead information: [place] [id number] [phone]                Discharge Instructions     AMB Referral to Cardiac Rehabilitation - Phase II   Complete by: As directed    Diagnosis: Coronary Stents   After  initial evaluation and assessments completed: Virtual Based Care may be provided alone or in conjunction with Phase 2 Cardiac Rehab based on patient barriers.: Yes   Call MD for:  difficulty breathing, headache or visual disturbances   Complete by: As directed    Call MD for:  persistant dizziness or light-headedness   Complete by: As directed    Call MD for:  redness, tenderness, or signs of infection (pain, swelling, redness, odor or green/yellow discharge around incision site)   Complete by: As directed    Diet - low sodium heart healthy   Complete by: As directed    Discharge instructions   Complete by: As directed    Groin Site Care Refer to this sheet in the next few weeks. These instructions provide you with information on caring for  yourself after your procedure. Your caregiver may also give you more specific instructions. Your treatment has been planned according to current medical practices, but problems sometimes occur. Call your caregiver if you have any problems or questions after your procedure. HOME CARE INSTRUCTIONS You may shower 24 hours after the procedure. Remove the bandage (dressing) and gently wash the site with plain soap and water. Gently pat the site dry.  Do not apply powder or lotion to the site.  Do not sit in a bathtub, swimming pool, or whirlpool for 5 to 7 days.  No bending, squatting, or lifting anything over 10 pounds (4.5 kg) as directed by your caregiver.  Inspect the site at least twice daily.  Do not drive home if you are discharged the same day of the procedure. Have someone else drive you.  You may drive 24 hours after the procedure unless otherwise instructed by your caregiver.  What to expect: Any bruising will usually fade within 1 to 2 weeks.  Blood that collects in the tissue (hematoma) may be painful to the touch. It should usually decrease in size and tenderness within 1 to 2 weeks.  SEEK IMMEDIATE MEDICAL CARE IF: You have unusual pain at the groin site or down the affected leg.  You have redness, warmth, swelling, or pain at the groin site.  You have drainage (other than a small amount of blood on the dressing).  You have chills.  You have a fever or persistent symptoms for more than 72 hours.  You have a fever and your symptoms suddenly get worse.  Your leg becomes pale, cool, tingly, or numb.  You have heavy bleeding from the site. Hold pressure on the site. Marland Kitchen  PLEASE DO NOT MISS ANY DOSES OF YOUR PLAVIX!!!!! Also keep a log of you blood pressures and bring back to your follow up appt. Please call the office with any questions.   Patients taking blood thinners should generally stay away from medicines like ibuprofen, Advil, Motrin, naproxen, and Aleve due to risk of stomach  bleeding. You may take Tylenol as directed or talk to your primary doctor about alternatives.   PLEASE ENSURE THAT YOU DO NOT RUN OUT OF YOUR PLAVIX. This medication is very important to remain on for at least 59month. IF you have issues obtaining this medication due to cost please CALL the office 3-5 business days prior to running out in order to prevent missing doses of this medication.   Increase activity slowly   Complete by: As directed        Discharge Medications   Allergies as of 02/11/2022       Reactions   Streptomycin Other (See Comments)  Passed out   Amlodipine Other (See Comments)   Dizziness on 39m   Codeine Other (See Comments)   Unknown   Elemental Sulfur    Other reaction(s): Other (See Comments) Pt not sure   Penicillins Other (See Comments)   Pass out   Sulfa Antibiotics Other (See Comments)   Unknown   Sulfonamide Derivatives Other (See Comments)   Unknown   Benicar [olmesartan] Other (See Comments)   Diizzy   Hctz [hydrochlorothiazide] Other (See Comments)   Vertigo, dizziness.    Lisinopril Other (See Comments)   lightheaded        Medication List     TAKE these medications    aspirin EC 81 MG tablet Take 81 mg by mouth daily.   atorvastatin 80 MG tablet Commonly known as: LIPITOR Take 1 tablet (80 mg total) by mouth daily.   clopidogrel 75 MG tablet Commonly known as: PLAVIX Take 1 tablet (75 mg total) by mouth daily.   COQ10 PO Take 1 capsule by mouth daily.   ibuprofen 800 MG tablet Commonly known as: ADVIL TAKE ONE TABLET EVERY 8 HOURS AS NEEDED   IRON PO Take 65 mg by mouth daily.   levothyroxine 75 MCG tablet Commonly known as: SYNTHROID TAKE ONE (1) TABLET BY MOUTH EACH DAY   metoprolol succinate 25 MG 24 hr tablet Commonly known as: TOPROL-XL TAKE ONE (1) TABLET BY MOUTH EVERY DAY   multivitamin with minerals tablet Take 6 tablets by mouth daily. Balance of nature   mupirocin ointment 2 % Commonly known as:  BACTROBAN Apply to inside of each nares daily for 10 days then twice a week for maintenance.   TURMERIC PO Take 1 capsule by mouth daily.   valACYclovir 500 MG tablet Commonly known as: VALTREX TAKE ONE (1) TABLET BY MOUTH EVERY DAY   vitamin C 100 MG tablet Take 100 mg by mouth daily.   Vitamin D 125 MCG (5000 UT) Caps Take 5,000 Units by mouth daily.   ZINC 15 PO Take 1 tablet by mouth daily.         Outstanding Labs/Studies   N/a   Duration of Discharge Encounter   Greater than 30 minutes including physician time.  Signed, LReino Bellis NP 02/11/2022, 9:57 AM

## 2022-02-12 LAB — LIPOPROTEIN A (LPA): Lipoprotein (a): 63.7 nmol/L — ABNORMAL HIGH (ref <75.0)

## 2022-02-16 NOTE — Progress Notes (Unsigned)
Cardiology Office Note:    Date:  02/18/2022   ID:  Kathleen [name], DOB [birth year], MRN [id number]  PCP:  Hali Marry, MD   [facility] Providers Cardiologist:  Sherren Mocha, MD     Referring MD: Hali Marry, *   Chief Complaint: post hospital follow-up CAD s/p DES   History of Present Illness:    Kathleen [name] is a very pleasant 81 y.o. female with a hx of pericardial effusion, CAD s/p PCI/DES to LAD, and hyperlipidemia.   Established care with Dr. Burt Knack on 12/20/2021 after being referred by PCP ration of pericardial effusion. Had CT of abdomen and pelvis 11/2021 for gross hematuria, incidentally found to have small pericardial effusion which is new from remote prior imaging. EKG revealed NSR at 71 bpm, frequent PVCs, LAD, age-indeterminate septal infarct.  Echocardiogram revealed moderate pericardial effusion with no evidence of tamponade, LVEF 40 to 45%, mild diffuse hypokinesis with focal wall motion abnormalities, moderate hypokinesis of left ventricle, mid apical anterior septal wall, anterior wall, anterior lateral wall, and apical segment, mild to moderate mitral valve regurgitation, moderate TR, function normal but moderately elevated PA systolic pressure 50 mmHg.  She was advised to undergo a gated coronary CTA due to the pattern of wall motion abnormality concerning for stenosis in LAD territory.   Coronary CTA 01/28/2022 revealed severe proximal LAD stenosis. She had not previously reported symptoms concerning for angina so was scheduled to return to see Dr. Burt Knack on 02/03/2022.  She denied chest pain, but reported marked fatigue and generalized weakness for several months, mild shortness of breath with exertion.  Decision making, she and Dr. Burt Knack agreed that she should undergo right and left heart catheterization for evaluation of pulmonary hypertension and pericardial effusion on echo as well as concern for LAD stenosis.  Right and left heart catheterization on  02/10/2022 that revealed severe proximal LAD stenosis treated successfully with PCI/DES, mid LAD to dist LAD 50% stenosed, ramus lesion 40%, widely patent dominant left circumflex with good stenosis, patent nondominant RCA (small vessel), essentially normal right heart hemodynamics, normal LVEDP, normal wedge pressure with no hemodynamic evidence of cardiac tamponade. Advised to continue aspirin and clopidogrel for at least 6 months.   Today, she is here with her husband. She continues to feel fatigued. Spent several hours at Urgent Care yesterday with her husband and did not eat anything until late afternoon.  No further hematuria noted.  Rare occasion of chest soreness since hospital discharge.  States her only symptom since establishing care with cardiology has been fatigue. Was previously very active working at [employer] and taking care of her home. Has been less active over the past 2 months. Will begin to increase activity.  Advised her to notify us if she has symptoms with increased activity.  Has not been consistently monitoring BP at home but has a cuff. She denies chest pain, shortness of breath, lower extremity edema, palpitations, melena, hematuria, hemoptysis, diaphoresis, weakness, presyncope, syncope, orthopnea, and PND.   Past Medical History:  Diagnosis Date   Cancer ([place])    Hx RT breast infiltrated ductal CA previously on tamoxifen and armidex    Herpes simplex    lt eye   Hypertension    Kidney stones    history   MVP (mitral valve prolapse)    asymptomatic   Personal history of chemotherapy    Personal history of radiation therapy     Past Surgical History:  Procedure Laterality Date   ABDOMINAL HYSTERECTOMY  partial   BREAST BIOPSY     BREAST LUMPECTOMY Right 2000   RT   CERVICAL LAMINECTOMY  1986   CORONARY STENT INTERVENTION N/A 02/10/2022   Procedure: CORONARY STENT INTERVENTION;  Surgeon: Sherren Mocha, MD;  Location: [place] [facility];  Service:  Cardiovascular;  Laterality: N/A;   RIGHT/LEFT HEART CATH AND CORONARY ANGIOGRAPHY N/A 02/10/2022   Procedure: RIGHT/LEFT HEART CATH AND CORONARY ANGIOGRAPHY;  Surgeon: Sherren Mocha, MD;  Location: [place] [facility];  Service: Cardiovascular;  Laterality: N/A;   SHOULDER SURGERY     rotator cuff repair   throidectomy  1988   For cancer     Current Medications: Current Meds  Medication Sig   Ascorbic Acid (VITAMIN C) 100 MG tablet Take 100 mg by mouth daily.   aspirin EC 81 MG tablet Take 81 mg by mouth daily.   atorvastatin (LIPITOR) 80 MG tablet Take 1 tablet (80 mg total) by mouth daily.   Cholecalciferol (VITAMIN D) 125 MCG (5000 UT) CAPS Take 5,000 Units by mouth daily.   clopidogrel (PLAVIX) 75 MG tablet Take 1 tablet (75 mg total) by mouth daily.   Coenzyme Q10 (COQ10 PO) Take 1 capsule by mouth daily.   Ferrous Sulfate (IRON PO) Take 65 mg by mouth daily.   ibuprofen (ADVIL) 800 MG tablet TAKE ONE TABLET EVERY 8 HOURS AS NEEDED   levothyroxine (SYNTHROID) 75 MCG tablet TAKE ONE (1) TABLET BY MOUTH EACH DAY   metoprolol succinate (TOPROL-XL) 25 MG 24 hr tablet TAKE ONE (1) TABLET BY MOUTH EVERY DAY   Multiple Vitamins-Minerals (MULTIVITAMIN WITH MINERALS) tablet Take 6 tablets by mouth daily. Balance of nature   mupirocin ointment (BACTROBAN) 2 % Apply to inside of each nares daily for 10 days then twice a week for maintenance.   pantoprazole (PROTONIX) 20 MG tablet Take 20 mg by mouth daily.   TURMERIC PO Take 1 capsule by mouth daily.   valACYclovir (VALTREX) 500 MG tablet TAKE ONE (1) TABLET BY MOUTH EVERY DAY   Zinc Sulfate (ZINC 15 PO) Take 1 tablet by mouth daily.     Allergies:   Streptomycin, Amlodipine, Codeine, Elemental sulfur, Penicillins, Sulfa antibiotics, Sulfonamide derivatives, Benicar [olmesartan], Hctz [hydrochlorothiazide], and Lisinopril   Social History   Socioeconomic History   Marital status: Married    Spouse name: Patrick Jupiter   Number of children: 1    Years of education: 12   Highest education level: 12th grade  Occupational History   Occupation: Working full-time at [employer].  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  Other Topics Concern   Not on file  Social History Narrative   Lives with her husband. She has one adopted daughter and two grandchildren. She has been working a lot but she enjoys Psychiatric nurse.    Social Determinants of Health   Financial Resource Strain: Low Risk  (04/12/2021)   Overall Financial Resource Strain (CARDIA)    Difficulty of Paying Living Expenses: Not hard at all  Food Insecurity: No Food Insecurity (04/12/2021)   Hunger Vital Sign    Worried About Running Out of Food in the Last Year: Never true    Ran Out of Food in the  Last Year: Never true  Transportation Needs: No Transportation Needs (04/12/2021)   PRAPARE - Hydrologist (Medical): No    Lack of Transportation (Non-Medical): No  Physical Activity: Inactive (04/12/2021)   Exercise Vital Sign    Days of Exercise per Week: 0 days    Minutes of Exercise per Session: 0 min  Stress: No Stress Concern Present (04/12/2021)   Independence    Feeling of Stress : Only a little  Social Connections: Moderately Isolated (04/12/2021)   Social Connection and Isolation Panel [NHANES]    Frequency of Communication with Friends and Family: More than three times a week    Frequency of Social Gatherings with Friends and Family: Three times a week    Attends Religious Services: Never    Active Member of Clubs or Organizations: No    Attends Archivist Meetings: Never    Marital Status: Married      Family History: The patient's family history includes Breast cancer in her mother.  ROS:   Please see the history of present illness.   + fatigue All other systems reviewed and are negative.  Labs/Other Studies Reviewed:    The following studies were reviewed today:  [facility] 02/10/22    Ramus lesion is 40% stenosed.   Prox LAD to Mid LAD lesion is 95% stenosed.   Mid LAD to Dist LAD lesion is 50% stenosed.   A drug-eluting stent was successfully placed using a SYNERGY XD 2.50X32.   Post intervention, there is a 0% residual stenosis.   1.  Severe proximal LAD stenosis, treated successfully with PCI using a 2.5 x 32 mm Synergy DES 2.  Widely patent, dominant left circumflex with no significant stenosis 3.  Patent, nondominant RCA (small vessel) 4.  Essentially normal right heart hemodynamics, normal LVEDP, normal wedge pressure, with no hemodynamic evidence of cardiac tamponade  Recommendations: Aspirin and clopidogrel at least 6 months, aggressive risk reduction measures, overnight observation with plans for discharge tomorrow as long as no complications arise.  CCTA 01/28/22  1. Obstructive CAD, with mixed plaque in the proximal LAD causing severe (70-99%) stenosis. Cardiac catheterization recommended   2. Coronary calcium score of 300. This was 67th percentile for age and sex matched control.   3.  Normal coronary origins with left dominance   4. Artifact due to PVC during acquisition. With ECG editing, coronary arteries are interpretable except for distal LCX   5. Moderate pericardial effusion measuring up to 1.5cm adjacent to right atrium   6.  PFO  Echo 01/08/22   1. Mild diffuse hypokinesis with focal wall motion abnormalities noted.  Left ventricular ejection fraction, by estimation, is 40 to 45%. The left  ventricle has mildly decreased function. The left ventricle demonstrates  regional wall motion abnormalities   (see scoring diagram/findings for  description). Left ventricular  diastolic function could not be evaluated. There is moderate hypokinesis  of the left ventricular, mid-apical anteroseptal wall, anterior wall,  anterolateral wall and apical segment.   2. Right ventricular systolic function is normal. The right ventricular  size is normal. There is moderately elevated pulmonary artery systolic  pressure. The estimated right ventricular systolic pressure is 47.8 mmHg.   3. Left atrial size was moderately dilated.   4. Right atrial size was mildly dilated.   5. Moderate pericardial effusion. The pericardial effusion is  circumferential. There is no evidence of cardiac tamponade.   6.  The mitral valve is abnormal. Mild to moderate mitral valve  regurgitation.   7. Tricuspid valve regurgitation is moderate.   8. The aortic valve is tricuspid. Aortic valve regurgitation is trivial.  No aortic stenosis is present.   9. The inferior vena cava is normal in size with greater than 50%  respiratory variability, suggesting right atrial pressure of 3 mmHg.   Comparison(s): Prior images unable to be directly viewed, comparison made  by report only. Changes from prior study are noted. Prior EF/wall motion  reported as normal.    Recent Labs: 11/05/2021: TSH 0.51 12/04/2021: ALT 17 02/11/2022: BUN 25; Creatinine, Ser 0.79; Hemoglobin 12.8; Platelets 149; Potassium 3.4; Sodium 142  Recent Lipid Panel    Component Value Date/Time   CHOL 141 04/03/2021 0000   TRIG 46 04/03/2021 0000   TRIG 79 07/25/2009 0000   HDL 60 04/03/2021 0000   CHOLHDL 2.4 04/03/2021 0000   VLDL 13 04/16/2016 0958   LDLCALC 68 04/03/2021 0000     Risk Assessment/Calculations:       Physical Exam:    VS:  BP (!) 140/60   Pulse 65   Ht 5' (1.524 m)   Wt 108 lb 9.6 oz (49.3 kg)   SpO2 98%   BMI 21.21 kg/m     Wt Readings from Last 3 Encounters:  02/18/22 108 lb 9.6 oz (49.3 kg)  02/10/22 107 lb (48.5 kg)  02/03/22 111 lb 6.4 oz (50.5 kg)      GEN:  Well nourished, well developed in no acute distress HEENT: Normal NECK: No JVD; No carotid bruits CARDIAC: Irregular RR, no murmurs, rubs, gallops RESPIRATORY:  Clear to auscultation without rales, wheezing or rhonchi  ABDOMEN: Soft, non-tender, non-distended MUSCULOSKELETAL:  No edema; No deformity. 2+ pedal pulses, equal bilaterally SKIN: Warm and dry. Yellowish-purple bruising left forearm, radial cath insertion  NEUROLOGIC:  Alert and oriented x 3 PSYCHIATRIC:  Normal affect   EKG:  EKG is ordered today.  The ekg ordered today demonstrates sinus rhythm with occasional PVCs, TWI V4-V6, no acute change from previous tracing  Diagnoses:    1. Coronary artery disease involving native coronary artery of native heart without angina pectoris   2. Essential hypertension   3. Hyperlipidemia LDL goal <70   4. Pericardial effusion    Assessment and Plan:     CAD s/p PCI/DES to LAD: Abnormal echocardiogram 01/08/22 revealed regional wall motion abnormalities consistent in the LAD territory.  Cardiac catheterization 02/10/2022 revealed severe proximal LAD stenosis treated with PCI/DES x1.  Widely patent dominant left circumflex with no significant stenosis as well as patent nondominant RCA. Advised to continue uninterrupted DAPT x 6 months. No bleeding concerns. Continue metoprolol, clopidogrel, aspirin, Lipitor.  Pericardial effusion: Mild to moderate on echo 01/08/22 with normal hemodynamics by cath. Advised at discharge no further therapy needed per Dr. [place]. She is asymptomatic today.  Advised her to notify us if she develops chest pain, shortness of breath, discomfort with position changes or other concerns.   Hypertension: SBP mildly elevated today. History of whitecoat hypertension. Does not monitor BP consistently at home but has a cuff.  Advised her to monitor BP on a consistent basis and report back to [place] in approximately 2 weeks. Would favor additional anti-hypertensive if BP  consistently > 135/80.  Reports prior intolerances of lisinopril, HCTZ, Benicar, amlodipine. Would favor initiation of low dose CCB or ARB with careful titration.   Hyperlipidemia LDL goal < 70: LDL 68 on 04/03/2021.  Normal LPA at 63.7. Will check lipid panel and LFTs today. Continue Lipitor.    Disposition: 4 months with Dr. Burt Knack  Medication Adjustments/Labs and Tests Ordered: Current medicines are reviewed at length with the patient today.  Concerns regarding medicines are outlined above.  Orders Placed This Encounter  Procedures   Lipid Profile   Hepatic function panel   EKG 12-Lead   No orders of the defined types were placed in this encounter.   Patient Instructions  Medication Instructions:   Your physician recommends that you continue on your current medications as directed. Please refer to the Current Medication list given to you today.  *If you need a refill on your cardiac medications before your next appointment, please call your pharmacy*   Lab Work:  TODAY!!!!  LIPID/LFT  If you have labs (blood work) drawn today and your tests are completely normal, you will receive your results only by: [place] (if you have MyChart) OR A paper copy in the mail If you have any lab test that is abnormal or we need to change your treatment, we will call you to review the results.   Testing/Procedures:  None ordered.   Follow-Up: At [facility], you and your health needs are our priority.  As part of our continuing mission to provide you with exceptional heart care, we have created designated Provider Care Teams.  These Care Teams include your primary Cardiologist (physician) and Advanced Practice Providers (APPs -  Physician Assistants and Nurse Practitioners) who all work together to provide you with the care you need, when you need it.  We recommend signing up for the patient portal called "MyChart".  Sign up information is provided on this After Visit Summary.   MyChart is used to connect with patients for Virtual Visits (Telemedicine).  Patients are able to view lab/test results, encounter notes, upcoming appointments, etc.  Non-urgent messages can be sent to your provider as well.   To learn more about what you can do with MyChart, go to [email or web address].    Your next appointment:   4 month(s)  The format for your next appointment:   In Person  Provider:   Sherren Mocha, MD     Other Instructions  HOW TO TAKE YOUR BLOOD PRESSURE: Rest 5 minutes before taking your blood pressure.  Don't smoke or drink caffeinated beverages for at least 30 minutes before. Take your blood pressure before (not after) you eat. Sit comfortably with your back supported and both feet on the floor (don't cross your legs). Elevate your arm to heart level on a table or a desk. Use the proper sized cuff. It should fit smoothly and snugly around your bare upper arm. There should be enough room to slip a fingertip under the cuff. The bottom edge of the cuff should be 1 inch above the crease of the elbow.  If you Blood Pressure is consistently above 135/80 X 3 please call office at [phone].   Important Information About Sugar         Signed, Emmaline Life, NP  02/18/2022 12:13 PM    [place]

## 2022-02-17 ENCOUNTER — Telehealth (HOSPITAL_COMMUNITY): Payer: Self-pay

## 2022-02-17 NOTE — Telephone Encounter (Signed)
Per phase I cardiac rehab, fax cardiac rehab referral to [place]. 

## 2022-02-18 ENCOUNTER — Encounter: Payer: Self-pay | Admitting: Nurse Practitioner

## 2022-02-18 ENCOUNTER — Ambulatory Visit: Payer: Medicare HMO | Admitting: Nurse Practitioner

## 2022-02-18 VITALS — BP 140/60 | HR 65 | Ht 60.0 in | Wt 108.6 lb

## 2022-02-18 DIAGNOSIS — I10 Essential (primary) hypertension: Secondary | ICD-10-CM

## 2022-02-18 DIAGNOSIS — I3139 Other pericardial effusion (noninflammatory): Secondary | ICD-10-CM

## 2022-02-18 DIAGNOSIS — E785 Hyperlipidemia, unspecified: Secondary | ICD-10-CM

## 2022-02-18 DIAGNOSIS — I2510 Atherosclerotic heart disease of native coronary artery without angina pectoris: Secondary | ICD-10-CM

## 2022-02-18 LAB — LIPID PANEL
Chol/HDL Ratio: 2.3 ratio (ref 0.0–4.4)
Cholesterol, Total: 122 mg/dL (ref 100–199)
HDL: 52 mg/dL (ref >39)
LDL Chol Calc (NIH): 58 mg/dL (ref 0–99)
Triglycerides: 56 mg/dL (ref 0–149)
VLDL Cholesterol Cal: 12 mg/dL (ref 5–40)

## 2022-02-18 LAB — HEPATIC FUNCTION PANEL
ALT: 26 IU/L (ref 0–32)
AST: 37 IU/L (ref 0–40)
Albumin: 4.2 g/dL (ref 3.7–4.7)
Alkaline Phosphatase: 79 IU/L (ref 44–121)
Bilirubin Total: 0.4 mg/dL (ref 0.0–1.2)
Bilirubin, Direct: 0.13 mg/dL (ref 0.00–0.40)
Total Protein: 6.7 g/dL (ref 6.0–8.5)

## 2022-02-18 NOTE — Patient Instructions (Signed)
Medication Instructions:   Your physician recommends that you continue on your current medications as directed. Please refer to the Current Medication list given to you today.  *If you need a refill on your cardiac medications before your next appointment, please call your pharmacy*   Lab Work:  TODAY!!!!  LIPID/LFT  If you have labs (blood work) drawn today and your tests are completely normal, you will receive your results only by: [place] (if you have MyChart) OR A paper copy in the mail If you have any lab test that is abnormal or we need to change your treatment, we will call you to review the results.   Testing/Procedures:  None ordered.   Follow-Up: At [facility], you and your health needs are our priority.  As part of our continuing mission to provide you with exceptional heart care, we have created designated Provider Care Teams.  These Care Teams include your primary Cardiologist (physician) and Advanced Practice Providers (APPs -  Physician Assistants and Nurse Practitioners) who all work together to provide you with the care you need, when you need it.  We recommend signing up for the patient portal called "MyChart".  Sign up information is provided on this After Visit Summary.  MyChart is used to connect with patients for Virtual Visits (Telemedicine).  Patients are able to view lab/test results, encounter notes, upcoming appointments, etc.  Non-urgent messages can be sent to your provider as well.   To learn more about what you can do with MyChart, go to [email or web address].    Your next appointment:   4 month(s)  The format for your next appointment:   In Person  Provider:   Sherren Mocha, MD     Other Instructions  HOW TO TAKE YOUR BLOOD PRESSURE: Rest 5 minutes before taking your blood pressure.  Don't smoke or drink caffeinated beverages for at least 30 minutes before. Take your blood pressure before (not after) you eat. Sit comfortably  with your back supported and both feet on the floor (don't cross your legs). Elevate your arm to heart level on a table or a desk. Use the proper sized cuff. It should fit smoothly and snugly around your bare upper arm. There should be enough room to slip a fingertip under the cuff. The bottom edge of the cuff should be 1 inch above the crease of the elbow.  If you Blood Pressure is consistently above 135/80 X 3 please call office at [phone].   Important Information About Sugar

## 2022-02-19 ENCOUNTER — Telehealth: Payer: Self-pay | Admitting: Cardiovascular Disease

## 2022-02-19 ENCOUNTER — Telehealth: Payer: Self-pay | Admitting: Orthopaedic Surgery

## 2022-02-19 DIAGNOSIS — Z0279 Encounter for issue of other medical certificate: Secondary | ICD-10-CM

## 2022-02-19 NOTE — Telephone Encounter (Signed)
Disability form and payment received. Form placed in Dr. Antionette Char box.

## 2022-02-19 NOTE — Telephone Encounter (Signed)
Pt submitted medical release form, Pt need Dr. Lorin Mercy and Dr. Marlou Sa to fill out short term forms. Both docotrs seen pt for both issues. Was referred from [place] to [employer] and pt pais $25.00 cash payment to [employer]. Accepted 02/19/2022

## 2022-02-25 ENCOUNTER — Telehealth: Payer: Self-pay | Admitting: Family Medicine

## 2022-02-25 NOTE — Telephone Encounter (Signed)
Call pt: we received disability paperwork but looks like Dr. Burt Knack also received forms and he is copleting. We will put these on hold.

## 2022-02-26 NOTE — Telephone Encounter (Signed)
Patient came in to sign the remaining portion, Pt received her copy, I will fax over the documents to the appropriate places  Thank you

## 2022-02-27 NOTE — Telephone Encounter (Signed)
Contacted patient and she states all providers who have treated her need to fill out paperwork. The company will not give her any information until all forms are sent in and she has been out of work for over 6 months. She is going to lose her job/benefits on August 23rd so she would like to have everything done by then.   Dr. Madilyn Fireman - can you work on completing paperwork received?

## 2022-02-28 NOTE — Telephone Encounter (Signed)
Form faxed

## 2022-02-28 NOTE — Telephone Encounter (Signed)
Patient advised about fee for form. She will come by to pick up a copy and pay the fee.

## 2022-02-28 NOTE — Telephone Encounter (Signed)
Form completed and placed in Tonya B box ?

## 2022-03-01 ENCOUNTER — Other Ambulatory Visit: Payer: Self-pay | Admitting: Family Medicine

## 2022-03-01 DIAGNOSIS — Z8673 Personal history of transient ischemic attack (TIA), and cerebral infarction without residual deficits: Secondary | ICD-10-CM

## 2022-03-06 ENCOUNTER — Emergency Department (HOSPITAL_BASED_OUTPATIENT_CLINIC_OR_DEPARTMENT_OTHER): Payer: Medicare HMO

## 2022-03-06 ENCOUNTER — Other Ambulatory Visit: Payer: Self-pay

## 2022-03-06 ENCOUNTER — Emergency Department (HOSPITAL_BASED_OUTPATIENT_CLINIC_OR_DEPARTMENT_OTHER)
Admission: EM | Admit: 2022-03-06 | Discharge: 2022-03-06 | Disposition: A | Discharge status: Home / Self Care | Payer: Medicare HMO | Attending: Emergency Medicine | Admitting: Emergency Medicine

## 2022-03-06 ENCOUNTER — Encounter (HOSPITAL_BASED_OUTPATIENT_CLINIC_OR_DEPARTMENT_OTHER): Payer: Self-pay | Admitting: *Deleted

## 2022-03-06 DIAGNOSIS — I10 Essential (primary) hypertension: Secondary | ICD-10-CM | POA: Diagnosis not present

## 2022-03-06 DIAGNOSIS — R3129 Other microscopic hematuria: Secondary | ICD-10-CM | POA: Diagnosis not present

## 2022-03-06 DIAGNOSIS — S39012A Strain of muscle, fascia and tendon of lower back, initial encounter: Secondary | ICD-10-CM | POA: Diagnosis not present

## 2022-03-06 DIAGNOSIS — R1031 Right lower quadrant pain: Secondary | ICD-10-CM | POA: Insufficient documentation

## 2022-03-06 DIAGNOSIS — R079 Chest pain, unspecified: Secondary | ICD-10-CM | POA: Diagnosis not present

## 2022-03-06 DIAGNOSIS — K5730 Diverticulosis of large intestine without perforation or abscess without bleeding: Secondary | ICD-10-CM | POA: Diagnosis not present

## 2022-03-06 DIAGNOSIS — X58XXXA Exposure to other specified factors, initial encounter: Secondary | ICD-10-CM | POA: Diagnosis not present

## 2022-03-06 DIAGNOSIS — I700 Atherosclerosis of aorta: Secondary | ICD-10-CM | POA: Insufficient documentation

## 2022-03-06 DIAGNOSIS — R3121 Asymptomatic microscopic hematuria: Secondary | ICD-10-CM | POA: Diagnosis not present

## 2022-03-06 DIAGNOSIS — Z79899 Other long term (current) drug therapy: Secondary | ICD-10-CM | POA: Diagnosis not present

## 2022-03-06 DIAGNOSIS — Z7982 Long term (current) use of aspirin: Secondary | ICD-10-CM | POA: Insufficient documentation

## 2022-03-06 DIAGNOSIS — N281 Cyst of kidney, acquired: Secondary | ICD-10-CM | POA: Diagnosis not present

## 2022-03-06 DIAGNOSIS — S34109A Unspecified injury to unspecified level of lumbar spinal cord, initial encounter: Secondary | ICD-10-CM | POA: Diagnosis present

## 2022-03-06 DIAGNOSIS — Z853 Personal history of malignant neoplasm of breast: Secondary | ICD-10-CM | POA: Diagnosis not present

## 2022-03-06 DIAGNOSIS — R109 Unspecified abdominal pain: Secondary | ICD-10-CM | POA: Diagnosis not present

## 2022-03-06 LAB — CBC WITH DIFFERENTIAL/PLATELET
Abs Immature Granulocytes: 0.03 10*3/uL (ref 0.00–0.07)
Basophils Absolute: 0 10*3/uL (ref 0.0–0.1)
Basophils Relative: 0 %
Eosinophils Absolute: 0 10*3/uL (ref 0.0–0.5)
Eosinophils Relative: 0 %
HCT: 39.6 % (ref 36.0–46.0)
Hemoglobin: 13.4 g/dL (ref 12.0–15.0)
Immature Granulocytes: 0 %
Lymphocytes Relative: 16 %
Lymphs Abs: 1.3 10*3/uL (ref 0.7–4.0)
MCH: 30.9 pg (ref 26.0–34.0)
MCHC: 33.8 g/dL (ref 30.0–36.0)
MCV: 91.2 fL (ref 80.0–100.0)
Monocytes Absolute: 0.6 10*3/uL (ref 0.1–1.0)
Monocytes Relative: 7 %
Neutro Abs: 6.6 10*3/uL (ref 1.7–7.7)
Neutrophils Relative %: 77 %
Platelets: 158 10*3/uL (ref 150–400)
RBC: 4.34 MIL/uL (ref 3.87–5.11)
RDW: 13 % (ref 11.5–15.5)
WBC: 8.6 10*3/uL (ref 4.0–10.5)
nRBC: 0 % (ref 0.0–0.2)

## 2022-03-06 LAB — URINALYSIS, ROUTINE W REFLEX MICROSCOPIC
Bilirubin Urine: NEGATIVE
Glucose, UA: NEGATIVE mg/dL
Ketones, ur: 15 mg/dL — AB
Leukocytes,Ua: NEGATIVE
Nitrite: NEGATIVE
Protein, ur: 100 mg/dL — AB
Specific Gravity, Urine: 1.025 (ref 1.005–1.030)
pH: 6 (ref 5.0–8.0)

## 2022-03-06 LAB — BASIC METABOLIC PANEL
Anion gap: 9 (ref 5–15)
BUN: 29 mg/dL — ABNORMAL HIGH (ref 8–23)
CO2: 25 mmol/L (ref 22–32)
Calcium: 9.3 mg/dL (ref 8.9–10.3)
Chloride: 104 mmol/L (ref 98–111)
Creatinine, Ser: 0.88 mg/dL (ref 0.44–1.00)
GFR, Estimated: >60 mL/min (ref >60)
Glucose, Bld: 104 mg/dL — ABNORMAL HIGH (ref 70–99)
Potassium: 4.1 mmol/L (ref 3.5–5.1)
Sodium: 138 mmol/L (ref 135–145)

## 2022-03-06 LAB — LIPASE, BLOOD: Lipase: 30 U/L (ref 11–51)

## 2022-03-06 LAB — URINALYSIS, MICROSCOPIC (REFLEX): RBC / HPF: >50 RBC/hpf (ref 0–5)

## 2022-03-06 MED ORDER — ACETAMINOPHEN 500 MG PO TABS
1000.0000 mg | ORAL_TABLET | Freq: Once | ORAL | Status: AC
  Administered 2022-03-06: 1000 mg via ORAL
  Filled 2022-03-06: qty 2

## 2022-03-06 MED ORDER — IOHEXOL 300 MG/ML  SOLN
100.0000 mL | Freq: Once | INTRAMUSCULAR | Status: AC | PRN
Start: 2022-03-06 — End: 2022-03-06
  Administered 2022-03-06: 100 mL via INTRAVENOUS

## 2022-03-06 NOTE — ED Notes (Signed)
Dc instructions reviewed with pt no questions or concerns at this time.  

## 2022-03-06 NOTE — Discharge Instructions (Signed)
You were seen today due to right-sided back pain.  Work-up revealed no acute cause of your pain.  Based on your history of moving furniture last night it seems likely that this is musculoskeletal in nature.  Please take Tylenol as needed over-the-counter and use Voltaren gel over the affected area.  There was blood noted in your urine and I recommend following up with urology for further evaluation.  If you develop chest pain, shortness of breath, or other life-threatening conditions, please return to the emergency department for further evaluation

## 2022-03-06 NOTE — ED Triage Notes (Signed)
Pt. Reports R back and flank area pain.  Pt. Stated she started hurting at 10am.  Pt. States she did not have any pain with urination and did not have any blood in her urine.  Pt. Said the pain has been steady and constant.  Pt. Had stents 3 weeks ago.

## 2022-03-06 NOTE — ED Provider Notes (Signed)
[facility] EMERGENCY DEPARTMENT Provider Note   CSN: [id number] Arrival date & time: 03/06/22  1345     History  Chief Complaint  Patient presents with   Back Pain    R flank pain    Kathleen [name] is a 81 y.o. female.  Patient presents to the hospital complaining of right-sided flank pain that began at 10 AM this morning.  Patient states the pain was sudden in onset, severe, and has been constant since onset.  She denies nausea, vomiting, dysuria, hematuria.  The patient is worried that she may have another kidney stone.  She denies chest pain, shortness of breath.  The patient states she had a stent in the left anterior descending coronary artery on July 24.  She does endorse moving furniture with her husband last night.  Past medical history otherwise significant for history of kidney stones, mitral valve prolapse, hypertension, breast cancer on the right side. HPI     Home Medications Prior to Admission medications   Medication Sig Start Date End Date Taking? Authorizing Provider  Ascorbic Acid (VITAMIN C) 100 MG tablet Take 100 mg by mouth daily.    [provider]  aspirin EC 81 MG tablet Take 81 mg by mouth daily.    [provider]  atorvastatin (LIPITOR) 80 MG tablet TAKE ONE (1) TABLET BY MOUTH EVERY DAY 03/03/22   Hali Marry, MD  Cholecalciferol (VITAMIN D) 125 MCG (5000 UT) CAPS Take 5,000 Units by mouth daily.    [provider]  clopidogrel (PLAVIX) 75 MG tablet Take 1 tablet (75 mg total) by mouth daily. 02/03/22   Sherren Mocha, MD  Coenzyme Q10 (COQ10 PO) Take 1 capsule by mouth daily.    [provider]  Ferrous Sulfate (IRON PO) Take 65 mg by mouth daily.    [provider]  ibuprofen (ADVIL) 800 MG tablet TAKE ONE TABLET EVERY 8 HOURS AS NEEDED 11/19/21   Marybelle Killings, MD  levothyroxine (SYNTHROID) 75 MCG tablet TAKE ONE (1) TABLET BY MOUTH EACH DAY 01/14/22   Hali Marry, MD  metoprolol  succinate (TOPROL-XL) 25 MG 24 hr tablet TAKE ONE (1) TABLET BY MOUTH EVERY DAY 10/31/21   Hali Marry, MD  Multiple Vitamins-Minerals (MULTIVITAMIN WITH MINERALS) tablet Take 6 tablets by mouth daily. Balance of nature    [provider]  mupirocin ointment (BACTROBAN) 2 % Apply to inside of each nares daily for 10 days then twice a week for maintenance. 01/15/22   Hali Marry, MD  pantoprazole (PROTONIX) 20 MG tablet Take 20 mg by mouth daily.    [provider]  TURMERIC PO Take 1 capsule by mouth daily.    [provider]  valACYclovir (VALTREX) 500 MG tablet TAKE ONE (1) TABLET BY MOUTH EVERY DAY 02/05/22   Hali Marry, MD  Zinc Sulfate (ZINC 15 PO) Take 1 tablet by mouth daily.    [provider]      Allergies    Streptomycin, Amlodipine, Codeine, Elemental sulfur, Penicillins, Sulfa antibiotics, Sulfonamide derivatives, Benicar [olmesartan], Hctz [hydrochlorothiazide], and Lisinopril    Review of Systems   Review of Systems  Constitutional:  Negative for fever.  Respiratory:  Negative for shortness of breath.   Cardiovascular:  Negative for chest pain.  Gastrointestinal:  Positive for abdominal pain. Negative for constipation, diarrhea, nausea and vomiting.  Genitourinary:  Positive for flank pain. Negative for dysuria and hematuria.    Physical Exam Updated Vital Signs  BP (!) 142/56   Pulse 62   Temp 98.1 F (36.7 C) (Oral)   Resp 17   Ht 5' (1.524 m)   Wt 49 kg   SpO2 94%   BMI 21.09 kg/m  Physical Exam Vitals and nursing note reviewed.  Constitutional:      General: She is not in acute distress.    Appearance: She is normal weight.  HENT:     Head: Normocephalic and atraumatic.     Mouth/Throat:     Mouth: Mucous membranes are moist.  Eyes:     Conjunctiva/sclera: Conjunctivae normal.  Cardiovascular:     Rate and Rhythm: Normal rate and regular rhythm.     Pulses: Normal pulses.  Pulmonary:      Effort: Pulmonary effort is normal.     Breath sounds: Normal breath sounds.  Abdominal:     Palpations: Abdomen is soft.     Tenderness: There is right CVA tenderness.  Musculoskeletal:        General: Normal range of motion.     Cervical back: Normal range of motion and neck supple.  Skin:    General: Skin is warm and dry.  Neurological:     Mental Status: She is alert.     ED Results / Procedures / Treatments   Labs (all labs ordered are listed, but only abnormal results are displayed) Labs Reviewed  BASIC METABOLIC PANEL - Abnormal; Notable for the following components:      Result Value   Glucose, Bld 104 (*)    BUN 29 (*)    All other components within normal limits  URINALYSIS, ROUTINE W REFLEX MICROSCOPIC - Abnormal; Notable for the following components:   Hgb urine dipstick LARGE (*)    Ketones, ur 15 (*)    Protein, ur 100 (*)    All other components within normal limits  URINALYSIS, MICROSCOPIC (REFLEX) - Abnormal; Notable for the following components:   Bacteria, UA FEW (*)    All other components within normal limits  CBC WITH DIFFERENTIAL/PLATELET  LIPASE, BLOOD    EKG EKG Interpretation  Date/Time:  Thursday March 06 2022 15:02:21 EDT Ventricular Rate:  69 PR Interval:  184 QRS Duration: 84 QT Interval:  394 QTC Calculation: 422 R Axis:   29 Text Interpretation: Sinus rhythm with frequent Premature ventricular complexes Anteroseptal infarct , age undetermined Abnormal ECG No significant change since last tracing Confirmed by Deno Etienne [phone]) on 03/06/2022 3:12:55 PM  Radiology DG Chest 2 View  Result Date: 03/06/2022 CLINICAL DATA:  Right back and flank pain, history of stents 3 weeks ago EXAM: CHEST - 2 VIEW COMPARISON:  Radiographs 09/19/2021 FINDINGS: No focal consolidation, pleural effusion, or pneumothorax. Unchanged mild cardiomegaly. Surgical clips and associated hazy opacity overlying the right lower chest. Right axillary surgical clips.  IMPRESSION: No active cardiopulmonary disease. Electronically Signed   By: Placido Sou M.D.   On: 03/06/2022 17:48   CT Abdomen Pelvis W Contrast  Result Date: 03/06/2022 CLINICAL DATA:  Flank pain, kidney stone suspected EXAM: CT ABDOMEN AND PELVIS WITH CONTRAST TECHNIQUE: Multidetector CT imaging of the abdomen and pelvis was performed using the standard protocol following bolus administration of intravenous contrast. RADIATION DOSE REDUCTION: This exam was performed according to the departmental dose-optimization program which includes automated exposure control, adjustment of the mA and/or kV according to patient size and/or use of iterative reconstruction technique. CONTRAST:  129m OMNIPAQUE IOHEXOL 300 MG/ML  SOLN COMPARISON:  None Available. FINDINGS: Lower chest: Trace  pericardial effusion. Hepatobiliary: No focal liver abnormality. No gallstones, gallbladder wall thickening, or pericholecystic fluid. No biliary dilatation. Pancreas: No focal lesion. Normal pancreatic contour. No surrounding inflammatory changes. No main pancreatic ductal dilatation. Spleen: Normal in size without focal abnormality. Adrenals/Urinary Tract: No adrenal nodule bilaterally. Bilateral kidneys enhance symmetrically. Simple parapelvic renal cysts. Simple renal cysts, in the absence of clinically indicated signs/symptoms, require no independent follow-up. Subcentimeter hypodensities too small to characterize. No hydronephrosis. No hydroureter. The urinary bladder is unremarkable. Slightly delayed excretion of intravenous contrast on the right compared to the left. Stomach/Bowel: Stomach is within normal limits. No evidence of bowel wall thickening or dilatation. Stool throughout the ascending, transverse, descending colon. Colonic diverticulosis. The appendix is not definitely identified with no inflammatory changes in the right lower quadrant to suggest acute appendicitis. Vascular/Lymphatic: No abdominal aorta or iliac  aneurysm. Mild atherosclerotic plaque of the aorta and its branches. No abdominal, pelvic, or inguinal lymphadenopathy. Reproductive: Status post hysterectomy. No adnexal masses. Other: No intraperitoneal free fluid. No intraperitoneal free gas. No organized fluid collection. Musculoskeletal: No abdominal wall hernia or abnormality. No suspicious lytic or blastic osseous lesions. No acute displaced fracture. Multilevel degenerative changes of the spine. Levoscoliosis centered at the L2 level. IMPRESSION: 1. Slightly delayed excretion of intravenous contrast on the right compared to the left. Unclear etiology. 2. Colonic diverticulosis with no acute diverticulitis. 3. Trace pericardial effusion. 4.  Aortic Atherosclerosis (ICD10-I70.0). Electronically Signed   By: Iven Finn M.D.   On: 03/06/2022 17:47    Procedures Procedures    Medications Ordered in ED Medications  acetaminophen (TYLENOL) tablet 1,000 mg (1,000 mg Oral Given 03/06/22 1638)  iohexol (OMNIPAQUE) 300 MG/ML solution 100 mL (100 mLs Intravenous Contrast Given 03/06/22 1704)    ED Course/ Medical Decision Making/ A&P                           Medical Decision Making Amount and/or Complexity of Data Reviewed Labs: ordered. Radiology: ordered.  Risk OTC drugs. Prescription drug management.   This patient presents to the ED for concern of right-sided flank pain/right-sided abdominal pain, this involves an extensive number of treatment options, and is a complaint that carries with it a high risk of complications and morbidity.  The differential diagnosis includes nephrolithiasis, pyelonephritis, appendicitis, cholecystitis, pneumonia, musculoskeletal injury/muscle strain and others   Co morbidities that complicate the patient evaluation  History of kidney stones   Additional history obtained:  Additional history obtained from patient's husband External records from outside source obtained and reviewed including  discharge summary from recent hospitalization for LAD stent   Lab Tests:  I Ordered, and personally interpreted labs.  The pertinent results include: Urinalysis with large hemoglobin, few bacteria; lipase 30, BUN 29, grossly normal CBC   Imaging Studies ordered:  I ordered imaging studies including CT abdomen pelvis and chest x-ray I independently visualized and interpreted imaging which showed slightly delayed excretion of IV contrast on the right compared to the left with unclear etiology.  Colonic diverticulosis without diverticulitis.  Trace pericardial effusion.  Aortic atherosclerosis.  No active cardiopulmonary disease on x-ray I agree with the radiologist interpretation   Cardiac Monitoring: / EKG:  The patient was maintained on a cardiac monitor.  I personally viewed and interpreted the cardiac monitored which showed an underlying rhythm of: Sinus rhythm  Problem List / ED Course / Critical interventions / Medication management  I ordered medication including Tylenol for pain Reevaluation  of the patient after these medicines showed that the patient improved I have reviewed the patients home medicines and have made adjustments as needed    Test / Admission - Considered:  No sign of appendicitis, cholecystitis, pneumonia, colitis, pancreatitis, nephrolithiasis, hydronephrosis on imaging.  The patient has significant improvement in her pain with Tylenol administration.  Based on the patient's imaging and lab results, and history of furniture moving, wonder if this may be musculoskeletal type injury/muscle strain.  Plan to discharge patient home at this time.  She may take Tylenol as needed for pain and may use Voltaren/diclofenac gel for pain over the affected area.  I have recommended to the patient that she follow-up with urology for the hematuria.  She states that she has been seen in the past for the same and has had persistent hematuria for a few years.  It has been sometime  since she has been evaluated.  There is no indication at this time for admission.  Return precautions provided.  Discharge home        Final Clinical Impression(s) / ED Diagnoses Final diagnoses:  Flank pain  Strain of lumbar region, initial encounter  Asymptomatic microscopic hematuria    Rx / DC Orders ED Discharge Orders     None         Ronny Bacon 03/06/22 [place], DO 03/06/22 1853

## 2022-03-12 ENCOUNTER — Encounter: Payer: Self-pay | Admitting: Family Medicine

## 2022-03-12 ENCOUNTER — Ambulatory Visit (INDEPENDENT_AMBULATORY_CARE_PROVIDER_SITE_OTHER): Payer: Medicare HMO | Admitting: Family Medicine

## 2022-03-12 VITALS — BP 144/73 | HR 70 | Temp 97.6°F | Wt 110.0 lb

## 2022-03-12 DIAGNOSIS — Z955 Presence of coronary angioplasty implant and graft: Secondary | ICD-10-CM | POA: Diagnosis not present

## 2022-03-12 DIAGNOSIS — I951 Orthostatic hypotension: Secondary | ICD-10-CM

## 2022-03-12 DIAGNOSIS — I10 Essential (primary) hypertension: Secondary | ICD-10-CM | POA: Diagnosis not present

## 2022-03-12 DIAGNOSIS — R350 Frequency of micturition: Secondary | ICD-10-CM

## 2022-03-12 DIAGNOSIS — R42 Dizziness and giddiness: Secondary | ICD-10-CM | POA: Diagnosis not present

## 2022-03-12 LAB — POCT URINALYSIS DIP (CLINITEK)
Bilirubin, UA: NEGATIVE
Glucose, UA: NEGATIVE mg/dL
Ketones, POC UA: NEGATIVE mg/dL
Nitrite, UA: NEGATIVE
POC PROTEIN,UA: 30 — AB
Spec Grav, UA: 1.015 (ref 1.010–1.025)
Urobilinogen, UA: 0.2 E.U./dL
pH, UA: 5.5 (ref 5.0–8.0)

## 2022-03-12 NOTE — Assessment & Plan Note (Signed)
Initial blood pressure was a little bit elevated today but we also did orthostatics and they were positive she actually had greater than a 15 point drop in her pressure from sitting to standing.

## 2022-03-12 NOTE — Assessment & Plan Note (Signed)
Now on Plavix and aspirin.  She is getting a lot of bruising on her forearms.

## 2022-03-12 NOTE — Assessment & Plan Note (Addendum)
Orthostatics revealed almost a 20 point drop in blood pressure from sitting to standing.  Some get a cut her metoprolol in half for the next 10 to 14 days and see if she feels better.  We may end up discontinuing it completely.  I am hesitant to stop it completely because she just had a coronary artery stent placed in July of most 4 weeks ago.

## 2022-03-12 NOTE — Progress Notes (Addendum)
Acute Office Visit  Subjective:     Patient ID: Kathleen [name], female    DOB: Mar 31, 1941, 81 y.o.   MRN: [id number]  Chief Complaint  Patient presents with   Urinary Frequency    HPI  Hospital follow up .   She says the morning of August 17 she woke up and made breakfast for her husband and felt fine then she started experiencing right-sided low to mid back pain that was radiating around to that right lower quadrant.  She said it was severe enough that she thought she was passing kidney stones.  She has a history of that though its been years since she has passed a kidney stone.  So she went to the emergency department.  She did not have any chest discomfort or shortness of breath.  She did have some right CVA tenderness on exam.  Chest x-ray was normal.  CT was unrevealing of the abdomen and pelvis.  She said she was given Tylenol and it did improve her pain.  She has not had any recurrence of symptoms since then and is doing better in that regard.  But she was told to follow-up on the blood in her urine.  She has chronic hematuria.  She is also been under a lot of stress since being out of work over the last month.  We had actually filled out FMLA for her recently but they are wanting to let her go and set up a her long-term disability.  Her husband has also not been feeling well.  She also reports that she has been having episodes of feeling a little off and lightheaded.  To the point where she has not been driving and she has been letting her husband drive.  Reports that her blood pressure was extremely high when she was in the emergency department but it seems to have come down.  She did get a new blood pressure machine and has been checking it intermittently.  Also complains of excessive bruising, but she is also taking Plavix and aspirin.  Is also undergoing cardiac clearance so that she can have left shoulder reversal surgery.  ROS      Objective:    BP (!) 144/73   Pulse 70    Temp 97.6 F (36.4 C) (Oral)   Wt 110 lb (49.9 kg)   SpO2 100%   BMI 21.48 kg/m    Physical Exam  Results for orders placed or performed in visit on 03/12/22  POCT URINALYSIS DIP (CLINITEK)  Result Value Ref Range   Color, UA yellow yellow   Clarity, UA clear clear   Glucose, UA negative negative mg/dL   Bilirubin, UA negative negative   Ketones, POC UA negative negative mg/dL   Spec Grav, UA 1.015 1.010 - 1.025   Blood, UA large (A) negative   pH, UA 5.5 5.0 - 8.0   POC PROTEIN,UA =30 (A) negative, trace   Urobilinogen, UA 0.2 0.2 or 1.0 E.U./dL   Nitrite, UA Negative Negative   Leukocytes, UA Small (1+) (A) Negative        Assessment & Plan:   Problem List Items Addressed This Visit       Cardiovascular and Mediastinum   Orthostasis    Orthostatics revealed almost a 20 point drop in blood pressure from sitting to standing.  Some get a cut her metoprolol in half for the next 10 to 14 days and see if she feels better.  We may end up discontinuing it  completely.  I am hesitant to stop it completely because she just had a coronary artery stent placed in July of most 4 weeks ago.      Essential hypertension    Initial blood pressure was a little bit elevated today but we also did orthostatics and they were positive she actually had greater than a 15 point drop in her pressure from sitting to standing.        Other   S/P coronary artery stent placement    Now on Plavix and aspirin.  She is getting a lot of bruising on her forearms.      Other Visit Diagnoses     Frequent urination    -  Primary   Relevant Orders   POCT URINALYSIS DIP (CLINITEK) (Completed)   Urine Culture   Lightheadedness          Frequent urination-negative nitrites are no sign of UTI but will send for culture for confirmation.  Right low back pain-seems to have resolved though the episode was quite intense.  It did seem to improve after Tylenol.  So I suspect it was musculoskeletal  related.  She is also said that her pain was worse that day when she would take a deep breath.   No orders of the defined types were placed in this encounter.   I spent 45 minutes on the day of the encounter to include pre-visit record review, face-to-face time with the patient and post visit ordering of test.   No follow-ups on file.  Beatrice Lecher, MD

## 2022-03-14 ENCOUNTER — Telehealth: Payer: Self-pay | Admitting: Cardiovascular Disease

## 2022-03-14 NOTE — Telephone Encounter (Signed)
Agree with recommendations to reduce metoprolol succinate to 12.5 mg daily and drink plenty of fluids. If BP stays low and patient symptomatic, need to stop Toprol altogether. thanks

## 2022-03-14 NOTE — Telephone Encounter (Signed)
Patient's call sent straight to triage. Patient complaining of BP being low and dropping when she stands up. Patient's PCP is aware of this and did orthostatic BPs on patient at her office 2 days ago. Dr. Madilyn Fireman recommend patient to take only metoprolol 12.5 mg (1/2 tablet) for now to see if that helps with her feeling lightheaded. Informed patient to follow her PCP's advise. Encouraged patient to drink some fluids and have a bite to eat when she is feeling lightheaded, and see if that helps. Asked patient if she has any compression stockings that they might help. Patient stated she does not have any, but she has noticed when she lays down that her feet have some numbness that clears after getting up. Patient has close follow-up with her PCP and she will see her again next week. Will forward to Dr. Burt Knack for further advisement.

## 2022-03-14 NOTE — Telephone Encounter (Signed)
Called patient back with Dr. Antionette Char advisement. Patient verbalized understanding.

## 2022-03-14 NOTE — Telephone Encounter (Signed)
Pt c/o BP issue: STAT if pt c/o blurred vision, one-sided weakness or slurred speech  1. What are your last 5 BP readings?  She states the top number has been between 100-118   2. Are you having any other symptoms (ex. Dizziness, headache, blurred vision, passed out)? Blurred vision in the morning and lightheadedness    3. What is your BP issue? Pt states she saw her PCP the other day. She states her BP has been low and she has been lightheaded when she stands up.

## 2022-03-15 LAB — URINE CULTURE
MICRO NUMBER:: 13825786
SPECIMEN QUALITY:: ADEQUATE

## 2022-03-17 MED ORDER — CIPROFLOXACIN HCL 250 MG PO TABS
250.0000 mg | ORAL_TABLET | Freq: Two times a day (BID) | ORAL | 0 refills | Status: AC
Start: 2022-03-17 — End: 2022-03-20

## 2022-03-17 NOTE — Progress Notes (Signed)
Call pt: Kathleen [name], urine culture came back positive.  I am going to send over a prescription to the pharmacy.  Let me know if you have any problems.  Orders Placed This Encounter     ciprofloxacin (CIPRO) 250 MG tablet         Sig: Take 1 tablet (250 mg total) by mouth 2 (two) times daily for 3 days.         Dispense:  6 tablet         Refill:  0

## 2022-03-17 NOTE — Addendum Note (Signed)
Addended by: Beatrice Lecher D on: 03/17/2022 07:15 AM   Modules accepted: Orders

## 2022-03-25 ENCOUNTER — Telehealth: Payer: Self-pay

## 2022-03-25 NOTE — Telephone Encounter (Signed)
Patient left a vm msg on medical assistant's line. Per patient, she is still having UTI symptoms. Patient took the full course of cipro. Requesting another course of antibiotics. Please advise, thanks.

## 2022-03-26 NOTE — Telephone Encounter (Signed)
Patient has been scheduled for an re-evaluation with provider.

## 2022-03-26 NOTE — Telephone Encounter (Signed)
Will need to come back in for repeat urinalysis and culture.  On the correct antibiotics so if she is still having symptoms something else may be going on.

## 2022-03-27 ENCOUNTER — Telehealth: Payer: Self-pay | Admitting: Family Medicine

## 2022-03-27 DIAGNOSIS — R300 Dysuria: Secondary | ICD-10-CM

## 2022-03-27 NOTE — Telephone Encounter (Signed)
Orders Placed This Encounter  Procedures   Urine Culture   Urinalysis, Routine w reflex microscopic

## 2022-03-28 ENCOUNTER — Ambulatory Visit: Payer: Medicare HMO | Admitting: Family Medicine

## 2022-04-02 ENCOUNTER — Encounter: Payer: Self-pay | Admitting: Family Medicine

## 2022-04-02 ENCOUNTER — Ambulatory Visit (INDEPENDENT_AMBULATORY_CARE_PROVIDER_SITE_OTHER): Payer: Medicare HMO | Admitting: Family Medicine

## 2022-04-02 VITALS — BP 165/83 | HR 71 | Wt 111.4 lb

## 2022-04-02 DIAGNOSIS — R319 Hematuria, unspecified: Secondary | ICD-10-CM

## 2022-04-02 DIAGNOSIS — I951 Orthostatic hypotension: Secondary | ICD-10-CM | POA: Diagnosis not present

## 2022-04-02 DIAGNOSIS — R300 Dysuria: Secondary | ICD-10-CM | POA: Diagnosis not present

## 2022-04-02 DIAGNOSIS — R1030 Lower abdominal pain, unspecified: Secondary | ICD-10-CM | POA: Diagnosis not present

## 2022-04-02 LAB — POCT URINALYSIS DIP (CLINITEK)
Bilirubin, UA: NEGATIVE
Glucose, UA: NEGATIVE mg/dL
Ketones, POC UA: NEGATIVE mg/dL
Leukocytes, UA: NEGATIVE
Nitrite, UA: NEGATIVE
POC PROTEIN,UA: NEGATIVE
Spec Grav, UA: 1.01 (ref 1.010–1.025)
Urobilinogen, UA: 0.2 E.U./dL
pH, UA: 6 (ref 5.0–8.0)

## 2022-04-02 NOTE — Progress Notes (Signed)
Acute Office Visit  Subjective:     Patient ID: Kathleen [name], female    DOB: 06/09/1941, 82 y.o.   MRN: [id number]  No chief complaint on file.   HPI Patient is in today for persistent urinary symptoms.  She was seen on August 23.  Urine culture came back positive for Pseudomonas aeruginosa she was treated with ciprofloxacin.  Lot of pelvic pressure radiating around to her back bilaterally.  She still having some frequent urination and saw some blood again over the weekend.  Does have a history of chronic hematuria after she had radiation therapy years ago.  Last time she was here she had reported dizziness and she did have positive orthostatics so we cut her metoprolol in half to see if that made a difference she has not noticed a difference in her symptoms.   ROS      Objective:    BP (!) 165/83   Pulse 71   Wt 111 lb 6.4 oz (50.5 kg)   SpO2 100%   BMI 21.76 kg/m    Physical Exam Vitals reviewed.  Constitutional:      Appearance: She is well-developed.  HENT:     Head: Normocephalic and atraumatic.  Eyes:     Conjunctiva/sclera: Conjunctivae normal.  Cardiovascular:     Rate and Rhythm: Normal rate.  Pulmonary:     Effort: Pulmonary effort is normal.  Skin:    General: Skin is dry.     Coloration: Skin is not pale.  Neurological:     Mental Status: She is alert and oriented to person, place, and time.  Psychiatric:        Behavior: Behavior normal.     Results for orders placed or performed in visit on 04/02/22  POCT URINALYSIS DIP (CLINITEK)  Result Value Ref Range   Color, UA yellow yellow   Clarity, UA clear clear   Glucose, UA negative negative mg/dL   Bilirubin, UA negative negative   Ketones, POC UA negative negative mg/dL   Spec Grav, UA 1.010 1.010 - 1.025   Blood, UA moderate (A) negative   pH, UA 6.0 5.0 - 8.0   POC PROTEIN,UA negative negative, trace   Urobilinogen, UA 0.2 0.2 or 1.0 E.U./dL   Nitrite, UA Negative Negative   Leukocytes,  UA Negative Negative        Assessment & Plan:   Problem List Items Addressed This Visit       Cardiovascular and Mediastinum   Orthostasis   Other Visit Diagnoses     Dysuria    -  Primary   Relevant Orders   POCT URINALYSIS DIP (CLINITEK) (Completed)   Urinalysis, microscopic only   Hematuria, unspecified type       Relevant Orders   Urine Culture   Urinalysis, microscopic only   Ambulatory referral to Urology   Lower abdominal pain          Dysuria with lower abdominal pain radiating to her back-we will repeat urinalysis today she feels like she really only had a day or 2 where she felt better on the antibiotic and now has recurrent symptoms when I want to verify whether or not we have cleared the infection she did complete the 6 tabs of ciprofloxacin.  If everything is negative then we can feel reassured that the infection has cleared and her pain may be coming from something else.  She did have a CT done of the abdomen and pelvis with contrast about 4 weeks  ago on August 17 that was unrevealing for potential cause.  I am going to go ahead and also refer her to urology she has a history of chronic hematuria ever since she had radiation therapy years ago but it sounds like she has been experiencing more pain and gross hematuria so I think it is worth further evaluation to make sure that we can rule out other causes such as bladder mass etc.  So consider that this could be more radicular pain from her low back that could be contributing.  Orthostatics-we did have her cut BP medication, metoprolol, in half.  She has been on half a tab for about 2 weeks and really has not noticed a big difference in her lightheadedness or dizziness.  No orders of the defined types were placed in this encounter.   No follow-ups on file.  Beatrice Lecher, MD

## 2022-04-04 LAB — URINE CULTURE
MICRO NUMBER:: 13917051
Result:: NO GROWTH
SPECIMEN QUALITY:: ADEQUATE

## 2022-04-04 LAB — URINALYSIS, MICROSCOPIC ONLY
Bacteria, UA: NONE SEEN /HPF
Hyaline Cast: NONE SEEN /LPF
RBC / HPF: NONE SEEN /HPF (ref 0–2)
Squamous Epithelial / HPF: NONE SEEN /HPF (ref <=5)

## 2022-04-04 NOTE — Progress Notes (Signed)
Call pt: the urine culture is negative.  So the antibiotic cleared everything up.  I know she just had a CT done about 4 weeks ago which did not show anything too worrisome.  So the next up is really getting her in with urology to figure out why she is still having a lot of discomfort I think she probably needs another bladder scope in my opinion.

## 2022-04-09 ENCOUNTER — Other Ambulatory Visit: Payer: Self-pay | Admitting: *Deleted

## 2022-04-09 DIAGNOSIS — R300 Dysuria: Secondary | ICD-10-CM

## 2022-04-09 DIAGNOSIS — R319 Hematuria, unspecified: Secondary | ICD-10-CM

## 2022-04-14 ENCOUNTER — Telehealth: Payer: Self-pay | Admitting: Cardiovascular Disease

## 2022-04-14 NOTE — Telephone Encounter (Signed)
Pt called to report that she saw her PCP last month for feeling tired and just "not well"...her UA showed she had a UTI which has since cleared up with antibiotics.   Pt says her PCP cut back on her metoprolol to 12.5 mg daily... since she was c/o feeling weak and tired... her systolic BP has been ranging from 100-115, she is unsure of her HR. She says this BP is low for her.   She denies dizziness, SOB... reports that she has been eating and drinking well.    I advised her that it would be helpful if she could monitor her BP  and HR and make a chart for Dr Burt Knack and her PCP to review without checking it too frequently... mainly when she is feeling tired.   To add some extra fluids to her diet and see if she has any improvement.   I will forward to Dr Burt Knack to see if appropriate to stop her metoprolol for now...  had stent placed 02/10/02.   Pt is seeing him 06/2022.   CBC 03/06/22 WNL.Marland Kitchenhemoglobin 13.4.

## 2022-04-14 NOTE — Telephone Encounter (Signed)
Pt c/o BP issue: STAT if pt c/o blurred vision, one-sided weakness or slurred speech  1. What are your last 5 BP readings? Patient states the top number is always low 111-116  2. Are you having any other symptoms (ex. Dizziness, headache, blurred vision, passed out)? Dont feel like her self  3. What is your BP issue? Bp keep getting lower

## 2022-04-14 NOTE — Telephone Encounter (Signed)
Yes I think it's fine for her to stop metoprolol thx!   Pt advised and will monitor her BP and HR and how she is feeling and let us know.

## 2022-04-15 ENCOUNTER — Ambulatory Visit (INDEPENDENT_AMBULATORY_CARE_PROVIDER_SITE_OTHER): Payer: Medicare HMO | Admitting: Family Medicine

## 2022-04-15 DIAGNOSIS — Z23 Encounter for immunization: Secondary | ICD-10-CM

## 2022-05-07 ENCOUNTER — Telehealth: Payer: Self-pay

## 2022-05-07 DIAGNOSIS — R29898 Other symptoms and signs involving the musculoskeletal system: Secondary | ICD-10-CM

## 2022-05-07 NOTE — Telephone Encounter (Signed)
Patient stopped by to ask if she could get a referral for a physical therapist for both legs she's having weakness and would like some therapy for that as soon as possible. Please advise.

## 2022-05-07 NOTE — Telephone Encounter (Signed)
Orders Placed This Encounter  Procedures  . Ambulatory referral to Physical Therapy    Referral Priority:   Routine    Referral Type:   Physical Medicine    Referral Reason:   Specialty Services Required    Requested Specialty:   Physical Therapy    Number of Visits Requested:   1    

## 2022-05-08 DIAGNOSIS — R310 Gross hematuria: Secondary | ICD-10-CM | POA: Diagnosis not present

## 2022-05-08 NOTE — Telephone Encounter (Signed)
Left a detailed vm msg for patient regarding the referral for Physical Therapy. Patient aware to contact the clinic if PT office does not reach out to patient within a week for scheduling.

## 2022-05-15 DIAGNOSIS — H1712 Central corneal opacity, left eye: Secondary | ICD-10-CM | POA: Diagnosis not present

## 2022-05-15 DIAGNOSIS — H04123 Dry eye syndrome of bilateral lacrimal glands: Secondary | ICD-10-CM | POA: Diagnosis not present

## 2022-05-15 DIAGNOSIS — H26491 Other secondary cataract, right eye: Secondary | ICD-10-CM | POA: Diagnosis not present

## 2022-05-15 DIAGNOSIS — R310 Gross hematuria: Secondary | ICD-10-CM | POA: Diagnosis not present

## 2022-05-15 DIAGNOSIS — N281 Cyst of kidney, acquired: Secondary | ICD-10-CM | POA: Diagnosis not present

## 2022-05-15 DIAGNOSIS — H35372 Puckering of macula, left eye: Secondary | ICD-10-CM | POA: Diagnosis not present

## 2022-05-23 ENCOUNTER — Ambulatory Visit: Payer: Medicare HMO | Attending: Family Medicine | Admitting: Physical Therapy

## 2022-05-23 ENCOUNTER — Other Ambulatory Visit: Payer: Self-pay

## 2022-05-23 DIAGNOSIS — M6281 Muscle weakness (generalized): Secondary | ICD-10-CM

## 2022-05-23 DIAGNOSIS — R29898 Other symptoms and signs involving the musculoskeletal system: Secondary | ICD-10-CM | POA: Insufficient documentation

## 2022-05-23 DIAGNOSIS — R2681 Unsteadiness on feet: Secondary | ICD-10-CM

## 2022-05-23 NOTE — Therapy (Signed)
OUTPATIENT PHYSICAL THERAPY LOWER EXTREMITY EVALUATION   Patient Name: Kathleen [name] MRN: [id number] DOB:[birth year], 81 y.o., female Today's Date: 05/23/2022   PT End of Session - 05/23/22 0933     Visit Number 1    Number of Visits 16    Date for PT Re-Evaluation 07/18/22    Authorization Type human[employer] medicare    Progress Note Due on Visit 10    PT Start Time 0930    PT Stop Time 1015    PT Time Calculation (min) 45 min             Past Medical History:  Diagnosis Date   Cancer ([place])    Hx RT breast infiltrated ductal CA previously on tamoxifen and armidex    Herpes simplex    lt eye   Hypertension    Kidney stones    history   MVP (mitral valve prolapse)    asymptomatic   Personal history of chemotherapy    Personal history of radiation therapy    Past Surgical History:  Procedure Laterality Date   ABDOMINAL HYSTERECTOMY     partial   BREAST BIOPSY     BREAST LUMPECTOMY Right 2000   RT   CERVICAL LAMINECTOMY  1986   CORONARY STENT INTERVENTION N/A 02/10/2022   Procedure: CORONARY STENT INTERVENTION;  Surgeon: Sherren Mocha, MD;  Location: [facility];  Service: Cardiovascular;  Laterality: N/A;   RIGHT/LEFT HEART CATH AND CORONARY ANGIOGRAPHY N/A 02/10/2022   Procedure: RIGHT/LEFT HEART CATH AND CORONARY ANGIOGRAPHY;  Surgeon: Sherren Mocha, MD;  Location: [facility];  Service: Cardiovascular;  Laterality: N/A;   SHOULDER SURGERY     rotator cuff repair   throidectomy  1988   For cancer    Patient Active Problem List   Diagnosis Date Noted   Orthostasis 03/12/2022   S/P coronary artery stent placement 03/12/2022   Abnormal cardiac CT angiography 02/10/2022   Complete tear of left rotator cuff 01/01/2022   Gross hematuria 12/04/2021   Impingement syndrome of left shoulder 12/02/2021   Radial styloid fracture 09/17/2021   Lumbar foraminal stenosis 09/12/2021   Weight loss 07/30/2021   Restrictive lung disease 05/15/2021   S/P stroke  due to cerebrovascular disease 09/17/2018   Primary osteoarthritis of left hip 07/08/2017   Primary osteoarthritis of both knees 02/25/2017   Lumbar spondylosis 12/03/2016   Internal derangement of left knee 10/09/2016   History of breast cancer 11/09/2012   Benign hematuria 07/05/2010   POSTMENOPAUSAL STATUS 03/29/2010   IMPAIRED FASTING GLUCOSE 12/11/2008   LEG PAIN, BILATERAL 01/20/2008   HIP PAIN, RIGHT 01/05/2008   Disorder of bone and cartilage 02/26/2007   Hypothyroidism 04/28/2006   Essential hypertension 04/28/2006   MITRAL VALVE DISORDER 04/28/2006    PCP: Beatrice Lecher  REFERRING PROVIDER: Hali Marry, MD  REFERRING DIAG: R29.898 (ICD-10-CM) - Weakness of both lower extremities  THERAPY DIAG:  Muscle weakness (generalized)  Rationale for Evaluation and Treatment: Rehabilitation  ONSET DATE: Since June 2023  SUBJECTIVE:   SUBJECTIVE STATEMENT: Pt reports she got heart stent in June. Pt reports feeling dizziness getting up/down. BP has been low since heart stent (SBP in 100s, prior to heart stent it was 140s). Pt notes general weakness, fatigue, and decreased balance. Pt states she has stopped taking some of her heart medication (as recommended by cardiologist) but has not felt a difference with her dizziness/balance. Feels worse in the middle of the day.   PERTINENT HISTORY: Heart stent in  June 2023, CVA 2018, fall in Feb 2023 leading to L RTC tear (not cleared to do surgery for this yet from cardiology)  PAIN:  Are you having pain? Yes: NPRS scale: 0 currently, at worst 4 or 5/10 Pain location: L knee/leg pain Pain description: Sharp at times Aggravating factors: Walking, turning over in bed sometimes Relieving factors: not sure  PRECAUTIONS: Fall  WEIGHT BEARING RESTRICTIONS: No  FALLS:  Has patient fallen in last 6 months? No  LIVING ENVIRONMENT: Lives with: lives with their spouse Lives in: House/apartment Stairs: No Has following  equipment at home: None  OCCUPATION: Retired from [employer]: Independent  PATIENT GOALS: Improve mobility, reduce fatigue  NEXT MD VISIT:   OBJECTIVE:   DIAGNOSTIC FINDINGS: n/a  PATIENT SURVEYS:  FOTO did not assess  COGNITION: Overall cognitive status: Within functional limits for tasks assessed     SENSATION: WFL  EDEMA:  none  MUSCLE LENGTH: Hamstrings: Right 80 deg; Left 80 deg Quad test: Right 100 deg; Left 70 deg  POSTURE: No Significant postural limitations  PALPATION: TTP L glute med  LOWER EXTREMITY ROM:  Active ROM Right eval Left eval  Hip flexion    Hip extension    Hip abduction    Hip adduction    Hip internal rotation    Hip external rotation    Knee flexion    Knee extension    Ankle dorsiflexion    Ankle plantarflexion    Ankle inversion    Ankle eversion     (Blank rows = not tested)  LOWER EXTREMITY MMT:  MMT Right eval Left eval  Hip flexion 4 4-  Hip extension 3+ 3  Hip abduction  3  Hip adduction    Hip internal rotation 4 4  Hip external rotation 4 3+  Knee flexion 4+ 4-  Knee extension 4+ 4-  Ankle dorsiflexion    Ankle plantarflexion    Ankle inversion    Ankle eversion     (Blank rows = not tested)   FUNCTIONAL TESTS:   From eval: 5 times sit to stand: knee valgus noted; 25 sec Berg Balance Scale: 49/56 Dynamic Gait Index: 20/24  [facility] PT Assessment - 05/23/22 0001       Standardized Balance Assessment   Standardized Balance Assessment Berg Balance Test;Dynamic Gait Index      Berg Balance Test   Sit to Stand Able to stand without using hands and stabilize independently    Standing Unsupported Able to stand safely 2 minutes    Sitting with Back Unsupported but Feet Supported on Floor or Stool Able to sit safely and securely 2 minutes    Stand to Sit Controls descent by using hands    Transfers Able to transfer safely, definite need of hands    Standing Unsupported with Eyes Closed Able to  stand 10 seconds with supervision    Standing Unsupported with Feet Together Able to place feet together independently and stand 1 minute safely    From Standing, Reach Forward with Outstretched Arm Can reach confidently >25 cm (10")    From Standing Position, Pick up Object from Floor Able to pick up shoe, needs supervision   Reports fear of falling forward   From Standing Position, Turn to Look Behind Over each Shoulder Looks behind from both sides and weight shifts well    Turn 360 Degrees Able to turn 360 degrees safely but slowly   5 sec   Standing Unsupported, Alternately Place Feet  on Step/Stool Able to stand independently and safely and complete 8 steps in 20 seconds    Standing Unsupported, One Foot in Front Able to plae foot ahead of the other independently and hold 30 seconds    Standing on One Leg Able to lift leg independently and hold > 10 seconds    Total Score 49      Dynamic Gait Index   Level Surface Mild Impairment    Change in Gait Speed Normal    Gait with Horizontal Head Turns Mild Impairment    Gait with Vertical Head Turns Mild Impairment   Mild LOB looking down   Gait and Pivot Turn Mild Impairment    Step Over Obstacle Normal    Step Around Obstacles Normal    Steps Normal    Total Score 20    DGI comment: 20/24              GAIT: Distance walked: 225' Assistive device utilized: None Level of assistance: Complete Independence Comments: WFL   TODAY'S TREATMENT:                                                                                                                              DATE: 05/23/22 none initiated    PATIENT EDUCATION:  Education details: Exam findings, POC Person educated: Patient Education method: Explanation, Demonstration, and Handouts Education comprehension: verbalized understanding, returned demonstration, and needs further education  HOME EXERCISE PROGRAM: Will initiate last session  ASSESSMENT:  CLINICAL  IMPRESSION: Patient is a 81 y.o. F who was seen today for physical therapy evaluation and treatment for generalized weakness and decreased balance. Pt demonstrates general LE weakness but most significant for decreased glute strength (L worse than R) affecting hip abd and ext for transfers and mobility with appearance of possible trochanteric bursitis on L LE. In regards to balance, pt demos moderate fall risk based on Berg Balance Score; however, her 5x STS demos high fall risk and reduced functional LE strength. Pt would benefit from PT to address these deficits to maximize her level of function and improve general activity tolerance.   OBJECTIVE IMPAIRMENTS: Abnormal gait, decreased activity tolerance, decreased balance, decreased mobility, difficulty walking, decreased strength, dizziness, increased fascial restrictions, increased muscle spasms, and pain.   ACTIVITY LIMITATIONS: lifting, bending, squatting, transfers, bed mobility, locomotion level, and caring for others  PARTICIPATION LIMITATIONS: cleaning, laundry, shopping, and community activity  PERSONAL FACTORS: Age, Fitness, Past/current experiences, and Time since onset of injury/illness/exacerbation are also affecting patient's functional outcome.   REHAB POTENTIAL: Good  CLINICAL DECISION MAKING: Evolving/moderate complexity  EVALUATION COMPLEXITY: Moderate   GOALS: Goals reviewed with patient? Yes  SHORT TERM GOALS: Target date: 06/20/2022  Pt will be ind with HEP Baseline: Goal status: INITIAL  2.  Pt will demo 5x STS to </=20 sec for improved functional LE strength Baseline:  Goal status: INITIAL    LONG TERM GOALS: Target date: 07/18/2022   Pt will  be ind with progression and advancement of HEP Baseline:  Goal status: INITIAL  2.  Pt 5xSTS will be </= 15 sec to demo decreased fall risk Baseline:  Goal status: INITIAL  3.  Pt will improve Berg Balance score to at least 52/56 to demo low fall risk Baseline:   Goal status: INITIAL  4.  Pt will be able to amb at least 15-20 min daily to improve endurance and activity tolerance Baseline:  Goal status: INITIAL     PLAN:  PT FREQUENCY: 2x/week  PT DURATION: 8 weeks  PLANNED INTERVENTIONS: Therapeutic exercises, Therapeutic activity, Neuromuscular re-education, Balance training, Gait training, Patient/Family education, Self Care, Joint mobilization, Aquatic Therapy, Electrical stimulation, Cryotherapy, Moist heat, Taping, Ionotophoresis '4mg'$ /ml Dexamethasone, Manual therapy, and Re-evaluation  PLAN FOR NEXT SESSION: Initiate strength and balance HEP. Work on hip strengthening and balance with eyes closed/foam.    Worley Radermacher April [place] L [place], PT 05/23/2022, 12:06 PM

## 2022-05-27 NOTE — Therapy (Signed)
OUTPATIENT PHYSICAL THERAPY LOWER EXTREMITY EVALUATION   Patient Name: Kathleen [name] MRN: [id number] DOB:04/10/1941, 81 y.o., female Today's Date: 05/29/2022   PT End of Session - 05/29/22 1101     Visit Number 2    Number of Visits 16    Date for PT Re-Evaluation 07/18/22    Authorization Type humana medicare    Progress Note Due on Visit 10    PT Start Time 1101    PT Stop Time 1145    PT Time Calculation (min) 44 min    Activity Tolerance Patient tolerated treatment well    Behavior During Therapy WFL for tasks assessed/performed             Past Medical History:  Diagnosis Date   Cancer ([place])    Hx RT breast infiltrated ductal CA previously on tamoxifen and armidex    Herpes simplex    lt eye   Hypertension    Kidney stones    history   MVP (mitral valve prolapse)    asymptomatic   Personal history of chemotherapy    Personal history of radiation therapy    Past Surgical History:  Procedure Laterality Date   ABDOMINAL HYSTERECTOMY     partial   BREAST BIOPSY     BREAST LUMPECTOMY Right 2000   RT   CERVICAL LAMINECTOMY  1986   CORONARY STENT INTERVENTION N/A 02/10/2022   Procedure: CORONARY STENT INTERVENTION;  Surgeon: Sherren Mocha, MD;  Location: [facility];  Service: Cardiovascular;  Laterality: N/A;   RIGHT/LEFT HEART CATH AND CORONARY ANGIOGRAPHY N/A 02/10/2022   Procedure: RIGHT/LEFT HEART CATH AND CORONARY ANGIOGRAPHY;  Surgeon: Sherren Mocha, MD;  Location: [facility];  Service: Cardiovascular;  Laterality: N/A;   SHOULDER SURGERY     rotator cuff repair   throidectomy  1988   For cancer    Patient Active Problem List   Diagnosis Date Noted   Orthostasis 03/12/2022   S/P coronary artery stent placement 03/12/2022   Abnormal cardiac CT angiography 02/10/2022   Complete tear of left rotator cuff 01/01/2022   Gross hematuria 12/04/2021   Impingement syndrome of left shoulder 12/02/2021   Radial styloid fracture 09/17/2021    Lumbar foraminal stenosis 09/12/2021   Weight loss 07/30/2021   Restrictive lung disease 05/15/2021   S/P stroke due to cerebrovascular disease 09/17/2018   Primary osteoarthritis of left hip 07/08/2017   Primary osteoarthritis of both knees 02/25/2017   Lumbar spondylosis 12/03/2016   Internal derangement of left knee 10/09/2016   History of breast cancer 11/09/2012   Benign hematuria 07/05/2010   POSTMENOPAUSAL STATUS 03/29/2010   IMPAIRED FASTING GLUCOSE 12/11/2008   LEG PAIN, BILATERAL 01/20/2008   HIP PAIN, RIGHT 01/05/2008   Disorder of bone and cartilage 02/26/2007   Hypothyroidism 04/28/2006   Essential hypertension 04/28/2006   MITRAL VALVE DISORDER 04/28/2006    PCP: Beatrice Lecher  REFERRING PROVIDER: Hali Marry, MD  REFERRING DIAG: R29.898 (ICD-10-CM) - Weakness of both lower extremities  THERAPY DIAG:  Muscle weakness (generalized)  Unsteadiness on feet  Dizziness and giddiness  Rationale for Evaluation and Treatment: Rehabilitation  ONSET DATE: Since June 2023  SUBJECTIVE:   SUBJECTIVE STATEMENT: Doing some walking in her driveway.  PERTINENT HISTORY: Heart stent in June 2023, CVA 2018, fall in Feb 2023 leading to L RTC tear (not cleared to do surgery for this yet from cardiology)  PAIN:  Are you having pain? Yes: NPRS scale: 0 currently, at worst 4 or  5/10 Pain location: L knee/leg pain Pain description: Sharp at times Aggravating factors: Walking, turning over in bed sometimes Relieving factors: not sure  PRECAUTIONS: Fall  WEIGHT BEARING RESTRICTIONS: No  FALLS:  Has patient fallen in last 6 months? No  LIVING ENVIRONMENT: Lives with: lives with their spouse Lives in: House/apartment Stairs: No Has following equipment at home: None  OCCUPATION: Retired from [employer]: Independent  PATIENT GOALS: Improve mobility, reduce fatigue  NEXT MD VISIT:   OBJECTIVE:   DIAGNOSTIC FINDINGS: n/a  PATIENT SURVEYS:   FOTO did not assess  COGNITION: Overall cognitive status: Within functional limits for tasks assessed     SENSATION: WFL  EDEMA:  none  MUSCLE LENGTH: Hamstrings: Right 80 deg; Left 80 deg Quad test: Right 100 deg; Left 70 deg  POSTURE: No Significant postural limitations  PALPATION: TTP L glute med  LOWER EXTREMITY ROM:  Active ROM Right eval Left eval  Hip flexion    Hip extension    Hip abduction    Hip adduction    Hip internal rotation    Hip external rotation    Knee flexion    Knee extension    Ankle dorsiflexion    Ankle plantarflexion    Ankle inversion    Ankle eversion     (Blank rows = not tested)  LOWER EXTREMITY MMT:  MMT Right eval Left eval  Hip flexion 4 4-  Hip extension 3+ 3  Hip abduction  3  Hip adduction    Hip internal rotation 4 4  Hip external rotation 4 3+  Knee flexion 4+ 4-  Knee extension 4+ 4-  Ankle dorsiflexion    Ankle plantarflexion    Ankle inversion    Ankle eversion     (Blank rows = not tested)   FUNCTIONAL TESTS:   From eval: 5 times sit to stand: knee valgus noted; 25 sec Berg Balance Scale: 49/56 Dynamic Gait Index: 20/24     GAIT: Distance walked: 225' Assistive device utilized: None Level of assistance: Complete Independence Comments: WFL   TODAY'S TREATMENT:                                                                                                                              DATE:  05/29/22 Nustep L7 x 3.3 min, L 6 x 1.5 min Sit to stand x 10 Standing hip ABD, ext and marching 1x10 bil  Supine bridge x 10, 1 x 5 sec hold to demonstrate progression Supine HS stretch with strap 2x 30 sec bil Seated piriformis stretch x 60 sec bil     05/23/22 none initiated    PATIENT EDUCATION:  Education details: Initial HEP Person educated: Patient Education method: Explanation, Demonstration, and Handouts Education comprehension: verbalized understanding and returned demonstration  HOME  EXERCISE PROGRAM: Access Code: [email or web address] URL: [email or web address] Date: 05/29/2022 Prepared by: Almyra Free  Exercises - Sit to Stand  - 3 x daily - 7 x  weekly - 1-2 sets - 10 reps - Standing Hip Abduction with Counter Support  - 1 x daily - 7 x weekly - 3 sets - 10 reps - Standing March with Counter Support  - 1 x daily - 7 x weekly - 3 sets - 10 reps - Standing Hip Extension with Counter Support  - 1 x daily - 7 x weekly - 3 sets - 10 reps - Supine Bridge  - 1 x daily - 7 x weekly - 1-3 sets - 10 reps - Seated Piriformis Stretch with Trunk Bend  - 2 x daily - 7 x weekly - 1 sets - 3 reps - 30-60 sec  hold  ASSESSMENT:  CLINICAL IMPRESSION: Janda tolerated TE well today. She is very motivated to get stronger and is willing to push herself. She reports some tightness in her hip ADDuctors when performing hip ABDuction. She also experienced some dizziness with sit to supine to sit transfers. She has had vertigo in the past and may benefit from a vestibular screen next visit.  OBJECTIVE IMPAIRMENTS: Abnormal gait, decreased activity tolerance, decreased balance, decreased mobility, difficulty walking, decreased strength, dizziness, increased fascial restrictions, increased muscle spasms, and pain.   ACTIVITY LIMITATIONS: lifting, bending, squatting, transfers, bed mobility, locomotion level, and caring for others  PARTICIPATION LIMITATIONS: cleaning, laundry, shopping, and community activity  PERSONAL FACTORS: Age, Fitness, Past/current experiences, and Time since onset of injury/illness/exacerbation are also affecting patient's functional outcome.   REHAB POTENTIAL: Good  CLINICAL DECISION MAKING: Evolving/moderate complexity  EVALUATION COMPLEXITY: Moderate   GOALS: Goals reviewed with patient? Yes  SHORT TERM GOALS: Target date: 06/20/2022  Pt will be ind with HEP Baseline: Goal status: INITIAL  2.  Pt will demo 5x STS to </=20 sec for improved functional LE  strength Baseline:  Goal status: INITIAL    LONG TERM GOALS: Target date: 07/18/2022   Pt will be ind with progression and advancement of HEP Baseline:  Goal status: INITIAL  2.  Pt 5xSTS will be </= 15 sec to demo decreased fall risk Baseline:  Goal status: INITIAL  3.  Pt will improve Berg Balance score to at least 52/56 to demo low fall risk Baseline:  Goal status: INITIAL  4.  Pt will be able to amb at least 15-20 min daily to improve endurance and activity tolerance Baseline:  Goal status: INITIAL     PLAN:  PT FREQUENCY: 2x/week  PT DURATION: 8 weeks  PLANNED INTERVENTIONS: Therapeutic exercises, Therapeutic activity, Neuromuscular re-education, Balance training, Gait training, Patient/Family education, Self Care, Joint mobilization, Aquatic Therapy, Electrical stimulation, Cryotherapy, Moist heat, Taping, Ionotophoresis '4mg'$ /ml Dexamethasone, Manual therapy, and Re-evaluation  PLAN FOR NEXT SESSION: Screen vestibular. Check adductor and hip flexor flexibility.  Work on hip strengthening and balance with eyes closed/foam.    Aprille Sawhney, PT 05/29/2022, 12:00 PM

## 2022-05-29 ENCOUNTER — Encounter: Payer: Self-pay | Admitting: Physical Therapy

## 2022-05-29 ENCOUNTER — Ambulatory Visit: Payer: Medicare HMO | Admitting: Physical Therapy

## 2022-05-29 DIAGNOSIS — M6281 Muscle weakness (generalized): Secondary | ICD-10-CM

## 2022-05-29 DIAGNOSIS — R2681 Unsteadiness on feet: Secondary | ICD-10-CM

## 2022-05-29 DIAGNOSIS — R42 Dizziness and giddiness: Secondary | ICD-10-CM

## 2022-05-29 DIAGNOSIS — R29898 Other symptoms and signs involving the musculoskeletal system: Secondary | ICD-10-CM | POA: Diagnosis not present

## 2022-06-03 DIAGNOSIS — L570 Actinic keratosis: Secondary | ICD-10-CM | POA: Diagnosis not present

## 2022-06-04 ENCOUNTER — Ambulatory Visit: Payer: Medicare HMO | Admitting: Physical Therapy

## 2022-06-06 ENCOUNTER — Ambulatory Visit: Payer: Medicare HMO | Admitting: Physical Therapy

## 2022-06-06 ENCOUNTER — Encounter: Payer: Self-pay | Admitting: Physical Therapy

## 2022-06-06 DIAGNOSIS — R29898 Other symptoms and signs involving the musculoskeletal system: Secondary | ICD-10-CM | POA: Diagnosis not present

## 2022-06-06 DIAGNOSIS — R2681 Unsteadiness on feet: Secondary | ICD-10-CM

## 2022-06-06 DIAGNOSIS — M6281 Muscle weakness (generalized): Secondary | ICD-10-CM

## 2022-06-06 NOTE — Therapy (Signed)
OUTPATIENT PHYSICAL THERAPY LOWER EXTREMITY EVALUATION   Patient Name: Kathleen [name] MRN: [id number] DOB:1941/06/02, 81 y.o., female Today's Date: 06/06/2022   PT End of Session - 06/06/22 0931     Visit Number 3    Number of Visits 16    Date for PT Re-Evaluation 07/18/22    Authorization Type [employer] medicare    Progress Note Due on Visit 10    PT Start Time 0931    PT Stop Time 1015    PT Time Calculation (min) 44 min    Activity Tolerance Patient tolerated treatment well    Behavior During Therapy WFL for tasks assessed/performed             Past Medical History:  Diagnosis Date   Cancer ([place])    Hx RT breast infiltrated ductal CA previously on tamoxifen and armidex    Herpes simplex    lt eye   Hypertension    Kidney stones    history   MVP (mitral valve prolapse)    asymptomatic   Personal history of chemotherapy    Personal history of radiation therapy    Past Surgical History:  Procedure Laterality Date   ABDOMINAL HYSTERECTOMY     partial   BREAST BIOPSY     BREAST LUMPECTOMY Right 2000   RT   CERVICAL LAMINECTOMY  1986   CORONARY STENT INTERVENTION N/A 02/10/2022   Procedure: CORONARY STENT INTERVENTION;  Surgeon: Sherren Mocha, MD;  Location: [facility];  Service: Cardiovascular;  Laterality: N/A;   RIGHT/LEFT HEART CATH AND CORONARY ANGIOGRAPHY N/A 02/10/2022   Procedure: RIGHT/LEFT HEART CATH AND CORONARY ANGIOGRAPHY;  Surgeon: Sherren Mocha, MD;  Location: [facility];  Service: Cardiovascular;  Laterality: N/A;   SHOULDER SURGERY     rotator cuff repair   throidectomy  1988   For cancer    Patient Active Problem List   Diagnosis Date Noted   Orthostasis 03/12/2022   S/P coronary artery stent placement 03/12/2022   Abnormal cardiac CT angiography 02/10/2022   Complete tear of left rotator cuff 01/01/2022   Gross hematuria 12/04/2021   Impingement syndrome of left shoulder 12/02/2021   Radial styloid fracture 09/17/2021    Lumbar foraminal stenosis 09/12/2021   Weight loss 07/30/2021   Restrictive lung disease 05/15/2021   S/P stroke due to cerebrovascular disease 09/17/2018   Primary osteoarthritis of left hip 07/08/2017   Primary osteoarthritis of both knees 02/25/2017   Lumbar spondylosis 12/03/2016   Internal derangement of left knee 10/09/2016   History of breast cancer 11/09/2012   Benign hematuria 07/05/2010   POSTMENOPAUSAL STATUS 03/29/2010   IMPAIRED FASTING GLUCOSE 12/11/2008   LEG PAIN, BILATERAL 01/20/2008   HIP PAIN, RIGHT 01/05/2008   Disorder of bone and cartilage 02/26/2007   Hypothyroidism 04/28/2006   Essential hypertension 04/28/2006   MITRAL VALVE DISORDER 04/28/2006    PCP: Beatrice Lecher  REFERRING PROVIDER: Hali Marry, MD  REFERRING DIAG: R29.898 (ICD-10-CM) - Weakness of both lower extremities  THERAPY DIAG:  Muscle weakness (generalized)  Unsteadiness on feet  Rationale for Evaluation and Treatment: Rehabilitation  ONSET DATE: Since June 2023  SUBJECTIVE:   SUBJECTIVE STATEMENT: Pt states she has been moving a lot of boxes. Has been feeling very sore. Has been doing her sit<>stand exercises.   PERTINENT HISTORY: Heart stent in June 2023, CVA 2018, fall in Feb 2023 leading to L RTC tear (not cleared to do surgery for this yet from cardiology)  PAIN:  Are  you having pain? Yes: NPRS scale: 0 currently, at worst 4 or 5/10 Pain location: L knee/leg pain Pain description: Sharp at times Aggravating factors: Walking, turning over in bed sometimes Relieving factors: not sure  PRECAUTIONS: Fall  WEIGHT BEARING RESTRICTIONS: No  FALLS:  Has patient fallen in last 6 months? No  LIVING ENVIRONMENT: Lives with: lives with their spouse Lives in: House/apartment Stairs: No Has following equipment at home: None  OCCUPATION: Retired from [employer]: Independent  PATIENT GOALS: Improve mobility, reduce fatigue  NEXT MD VISIT:    OBJECTIVE:   DIAGNOSTIC FINDINGS: n/a  PATIENT SURVEYS:  FOTO did not assess  COGNITION: Overall cognitive status: Within functional limits for tasks assessed     SENSATION: WFL  EDEMA:  none  MUSCLE LENGTH: Hamstrings: Right 80 deg; Left 80 deg Quad test: Right 100 deg; Left 70 deg  POSTURE: No Significant postural limitations  PALPATION: TTP L glute med  LOWER EXTREMITY ROM:  Active ROM Right eval Left eval  Hip flexion    Hip extension    Hip abduction    Hip adduction    Hip internal rotation    Hip external rotation    Knee flexion    Knee extension    Ankle dorsiflexion    Ankle plantarflexion    Ankle inversion    Ankle eversion     (Blank rows = not tested)  LOWER EXTREMITY MMT:  MMT Right eval Left eval  Hip flexion 4 4-  Hip extension 3+ 3  Hip abduction 3 3  Hip adduction    Hip internal rotation 4 4  Hip external rotation 4 3+  Knee flexion 4+ 4-  Knee extension 4+ 4-  Ankle dorsiflexion    Ankle plantarflexion    Ankle inversion    Ankle eversion     (Blank rows = not tested)   FUNCTIONAL TESTS:   From eval: 5 times sit to stand: knee valgus noted; 25 sec Berg Balance Scale: 49/56 Dynamic Gait Index: 20/24   GAIT: Distance walked: 225' Assistive device utilized: None Level of assistance: Complete Independence Comments: WFL  VESTIBULAR ASSESSMENT 11/17:   POSITIONAL TESTING: Right Dix-Hallpike: upbeating, right nystagmus Left Dix-Hallpike: no nystagmus and No nystagmus but felt symptomatic ~15 sec Other: ~18 sec of R upbeating nystagmus   TODAY'S TREATMENT:                                                                                                                              DATE:  [facility] Adult PT Treatment:                                                DATE: 06/06/22 Therapeutic Exercise: Nustep L5 x 5 min Sitting Hamstring stretch x 30 sec Figure 4 stretch 2x 30 sec Gastroc  stretch with strap x 30  sec Standing Hip abd, ext, flexion yellow 2x5 Heel raise 2x10 Hamstring curl yellow TB 2x5 Canalith Repositioning Epley maneuver x1 R & L   05/29/22 Nustep L7 x 3.3 min, L 6 x 1.5 min Sit to stand x 10 Standing hip ABD, ext and marching 1x10 bil  Supine bridge x 10, 1 x 5 sec hold to demonstrate progression Supine HS stretch with strap 2x 30 sec bil Seated piriformis stretch x 60 sec bil     05/23/22 none initiated    PATIENT EDUCATION:  Education details: Initial HEP Person educated: Patient Education method: Explanation, Demonstration, and Handouts Education comprehension: verbalized understanding and returned demonstration  HOME EXERCISE PROGRAM: Access Code: [email or web address] URL: [email or web address] Date: 05/29/2022 Prepared by: Almyra Free  Exercises - Sit to Stand  - 3 x daily - 7 x weekly - 1-2 sets - 10 reps - Standing Hip Abduction with Counter Support  - 1 x daily - 7 x weekly - 3 sets - 10 reps - Standing March with Counter Support  - 1 x daily - 7 x weekly - 3 sets - 10 reps - Standing Hip Extension with Counter Support  - 1 x daily - 7 x weekly - 3 sets - 10 reps - Supine Bridge  - 1 x daily - 7 x weekly - 1-3 sets - 10 reps - Seated Piriformis Stretch with Trunk Bend  - 2 x daily - 7 x weekly - 1 sets - 3 reps - 30-60 sec  hold  ASSESSMENT:  CLINICAL IMPRESSION: Pt tolerated progression to yellow TB. Pt fatigued and sore from working in the house. Re-checked pt for BPPV -- found to be (+) for R canalithiasis. Provided epley maneuver to address. New goal added to address this.  OBJECTIVE IMPAIRMENTS: Abnormal gait, decreased activity tolerance, decreased balance, decreased mobility, difficulty walking, decreased strength, dizziness, increased fascial restrictions, increased muscle spasms, and pain.   ACTIVITY LIMITATIONS: lifting, bending, squatting, transfers, bed mobility, locomotion level, and caring for others  PARTICIPATION LIMITATIONS: cleaning,  laundry, shopping, and community activity  PERSONAL FACTORS: Age, Fitness, Past/current experiences, and Time since onset of injury/illness/exacerbation are also affecting patient's functional outcome.   REHAB POTENTIAL: Good  CLINICAL DECISION MAKING: Evolving/moderate complexity  EVALUATION COMPLEXITY: Moderate   GOALS: Goals reviewed with patient? Yes  SHORT TERM GOALS: Target date: 06/20/2022  Pt will be ind with HEP Baseline: Goal status: INITIAL  2.  Pt will demo 5x STS to </=20 sec for improved functional LE strength Baseline:  Goal status: INITIAL    LONG TERM GOALS: Target date: 07/18/2022   Pt will be ind with progression and advancement of HEP Baseline:  Goal status: INITIAL  2.  Pt 5xSTS will be </= 15 sec to demo decreased fall risk Baseline:  Goal status: INITIAL  3.  Pt will improve Berg Balance score to at least 52/56 to demo low fall risk Baseline:  Goal status: INITIAL  4.  Pt will be able to amb at least 15-20 min daily to improve endurance and activity tolerance Baseline:  Goal status: INITIAL  5.  Pt will demo no s/s of BPPV with canalith testing for improved balance Baseline:  Goal status: INITIAL      PLAN:  PT FREQUENCY: 2x/week  PT DURATION: 8 weeks  PLANNED INTERVENTIONS: Therapeutic exercises, Therapeutic activity, Neuromuscular re-education, Balance training, Gait training, Patient/Family education, Self Care, Joint mobilization, Vestibular training, Canalith repositioning, Aquatic Therapy, Electrical stimulation, Cryotherapy, Moist heat, Taping, Ionotophoresis  $'4mg'g$ /ml Dexamethasone, Manual therapy, and Re-evaluation  PLAN FOR NEXT SESSION: Screen BPPV. Check adductor and hip flexor flexibility.  Work on hip strengthening and balance with eyes closed/foam.    Ercelle Winkles April Ma L Carlos Quackenbush, PT, DPT 06/06/2022, 9:34 AM

## 2022-06-11 ENCOUNTER — Ambulatory Visit: Payer: Medicare HMO | Admitting: Physical Therapy

## 2022-06-11 ENCOUNTER — Encounter: Payer: Self-pay | Admitting: Physical Therapy

## 2022-06-11 DIAGNOSIS — M6281 Muscle weakness (generalized): Secondary | ICD-10-CM

## 2022-06-11 DIAGNOSIS — R29898 Other symptoms and signs involving the musculoskeletal system: Secondary | ICD-10-CM | POA: Diagnosis not present

## 2022-06-11 DIAGNOSIS — R2681 Unsteadiness on feet: Secondary | ICD-10-CM

## 2022-06-11 DIAGNOSIS — R42 Dizziness and giddiness: Secondary | ICD-10-CM

## 2022-06-11 NOTE — Therapy (Addendum)
OUTPATIENT PHYSICAL THERAPY TREATMENT AND DISCHARGE   Patient Name: Kathleen [name] MRN: [id number] DOB:1940-12-16, 81 y.o., female Today's Date: 06/11/2022  PHYSICAL THERAPY DISCHARGE SUMMARY  Visits from Start of Care: 4  Current functional level related to goals / functional outcomes: See below   Remaining deficits: See below -- pt has not returned since last visit. Unable to follow up on BPPV and her pain.    Education / Equipment: See below   Patient agrees to discharge. Patient goals were not met. Patient is being discharged due to not returning since the last visit.    PT End of Session - 06/11/22 1311     Visit Number 4    Number of Visits 16    Date for PT Re-Evaluation 07/18/22    Authorization Type [employer]    Progress Note Due on Visit 10    PT Start Time 1311    PT Stop Time 0973    PT Time Calculation (min) 44 min    Activity Tolerance Patient tolerated treatment well    Behavior During Therapy WFL for tasks assessed/performed             Past Medical History:  Diagnosis Date   Cancer ([place])    Hx RT breast infiltrated ductal CA previously on tamoxifen and armidex    Herpes simplex    lt eye   Hypertension    Kidney stones    history   MVP (mitral valve prolapse)    asymptomatic   Personal history of chemotherapy    Personal history of radiation therapy    Past Surgical History:  Procedure Laterality Date   ABDOMINAL HYSTERECTOMY     partial   BREAST BIOPSY     BREAST LUMPECTOMY Right 2000   RT   CERVICAL LAMINECTOMY  1986   CORONARY STENT INTERVENTION N/A 02/10/2022   Procedure: CORONARY STENT INTERVENTION;  Surgeon: Sherren Mocha, MD;  Location: [facility];  Service: Cardiovascular;  Laterality: N/A;   RIGHT/LEFT HEART CATH AND CORONARY ANGIOGRAPHY N/A 02/10/2022   Procedure: RIGHT/LEFT HEART CATH AND CORONARY ANGIOGRAPHY;  Surgeon: Sherren Mocha, MD;  Location: [facility];  Service: Cardiovascular;  Laterality:  N/A;   SHOULDER SURGERY     rotator cuff repair   throidectomy  1988   For cancer    Patient Active Problem List   Diagnosis Date Noted   Orthostasis 03/12/2022   S/P coronary artery stent placement 03/12/2022   Abnormal cardiac CT angiography 02/10/2022   Complete tear of left rotator cuff 01/01/2022   Gross hematuria 12/04/2021   Impingement syndrome of left shoulder 12/02/2021   Radial styloid fracture 09/17/2021   Lumbar foraminal stenosis 09/12/2021   Weight loss 07/30/2021   Restrictive lung disease 05/15/2021   S/P stroke due to cerebrovascular disease 09/17/2018   Primary osteoarthritis of left hip 07/08/2017   Primary osteoarthritis of both knees 02/25/2017   Lumbar spondylosis 12/03/2016   Internal derangement of left knee 10/09/2016   History of breast cancer 11/09/2012   Benign hematuria 07/05/2010   POSTMENOPAUSAL STATUS 03/29/2010   IMPAIRED FASTING GLUCOSE 12/11/2008   LEG PAIN, BILATERAL 01/20/2008   HIP PAIN, RIGHT 01/05/2008   Disorder of bone and cartilage 02/26/2007   Hypothyroidism 04/28/2006   Essential hypertension 04/28/2006   MITRAL VALVE DISORDER 04/28/2006    PCP: Beatrice Lecher  REFERRING PROVIDER: Hali Marry, MD  REFERRING DIAG: R29.898 (ICD-10-CM) - Weakness of both lower extremities  THERAPY DIAG:  Muscle weakness (  generalized)  Unsteadiness on feet  Dizziness and giddiness  Rationale for Evaluation and Treatment: Rehabilitation  ONSET DATE: Since June 2023  SUBJECTIVE:   SUBJECTIVE STATEMENT: Pt states she called [facility] but did not feel she was the right fit for their program. Pt reports she is feeling a little better after the Epley Maneuver. Pt reports she has not been able to do her exercises.   PERTINENT HISTORY: Heart stent in June 2023, CVA 2018, fall in Feb 2023 leading to L RTC tear (not cleared to do surgery for this yet from cardiology)  PAIN:  Are you having pain? Yes: NPRS  scale: 0 currently, at worst 4 or 5/10 Pain location: L knee/leg pain Pain description: Sharp at times Aggravating factors: Walking, turning over in bed sometimes Relieving factors: not sure  PRECAUTIONS: Fall  WEIGHT BEARING RESTRICTIONS: No  FALLS:  Has patient fallen in last 6 months? No  LIVING ENVIRONMENT: Lives with: lives with their spouse Lives in: House/apartment Stairs: No Has following equipment at home: None  OCCUPATION: Retired from [employer]: Independent  PATIENT GOALS: Improve mobility, reduce fatigue  NEXT MD VISIT:   OBJECTIVE:   DIAGNOSTIC FINDINGS: n/a  PATIENT SURVEYS:  FOTO did not assess  COGNITION: Overall cognitive status: Within functional limits for tasks assessed     SENSATION: WFL  EDEMA:  none  MUSCLE LENGTH: Hamstrings: Right 80 deg; Left 80 deg Quad test: Right 100 deg; Left 70 deg  POSTURE: No Significant postural limitations  PALPATION: TTP L glute med  LOWER EXTREMITY ROM:  Active ROM Right eval Left eval  Hip flexion    Hip extension    Hip abduction    Hip adduction    Hip internal rotation    Hip external rotation    Knee flexion    Knee extension    Ankle dorsiflexion    Ankle plantarflexion    Ankle inversion    Ankle eversion     (Blank rows = not tested)  LOWER EXTREMITY MMT:  MMT Right eval Left eval  Hip flexion 4 4-  Hip extension 3+ 3  Hip abduction 3 3  Hip adduction    Hip internal rotation 4 4  Hip external rotation 4 3+  Knee flexion 4+ 4-  Knee extension 4+ 4-  Ankle dorsiflexion    Ankle plantarflexion    Ankle inversion    Ankle eversion     (Blank rows = not tested)   FUNCTIONAL TESTS:   From eval: 5 times sit to stand: knee valgus noted; 25 sec Berg Balance Scale: 49/56 Dynamic Gait Index: 20/24   GAIT: Distance walked: 225' Assistive device utilized: None Level of assistance: Complete Independence Comments: WFL  VESTIBULAR ASSESSMENT :   POSITIONAL  TESTING 11/17: Right Dix-Hallpike: upbeating, right nystagmus Left Dix-Hallpike: no nystagmus and No nystagmus but felt symptomatic ~15 sec Other: ~18 sec of R upbeating nystagmus  POSITIONAL TESTING 11/22:  Right Dix-Hallpike: None Left Dix-Hallpike: None Sidelying test R: Mild symptoms ~5 sec but no nystagmus Sidelying test L: Mild symptoms ~5 sec but no nystagmus   TOD[facility]'S TREATMENT:  [facility]                                                DATE: 06/11/22 Therapeutic Exercise: Nustep L5 x 6 min Sitting Hamstring stretch 2x 30 sec Figure 4 stretch 2x 30 sec Gastroc stretch on slant board 2x 30 sec Hamstring curl yellow TB 2x10 LAQ yellow TB 2x10 Standing Hip abd, ext, flexion yellow 2x10 Heel raise 2x10 Mini squat 2x10 Canalith Repositioning Semont maneuver R&L x1 each   [facility] Adult PT Treatment:                                                DATE: 06/06/22 Therapeutic Exercise: Nustep L5 x 5 min Sitting Hamstring stretch x 30 sec Figure 4 stretch 2x 30 sec Gastroc stretch with strap x 30 sec Standing Hip abd, ext, flexion yellow 2x5 Heel raise 2x10 Hamstring curl yellow TB 2x5 Canalith Repositioning Epley maneuver x1 R & L   05/29/22 Nustep L7 x 3.3 min, L 6 x 1.5 min Sit to stand x 10 Standing hip ABD, ext and marching 1x10 bil  Supine bridge x 10, 1 x 5 sec hold to demonstrate progression Supine HS stretch with strap 2x 30 sec bil Seated piriformis stretch x 60 sec bil     05/23/22 none initiated    PATIENT EDUCATION:  Education details: Initial HEP Person educated: Patient Education method: Explanation, Demonstration, and Handouts Education comprehension: verbalized understanding and returned demonstration  HOME EXERCISE PROGRAM: Access Code: [email or web address] URL: [email or web address] Date:  05/29/2022 Prepared by: Almyra Free  Exercises - Sit to Stand  - 3 x daily - 7 x weekly - 1-2 sets - 10 reps - Standing Hip Abduction with Counter Support  - 1 x daily - 7 x weekly - 3 sets - 10 reps - Standing March with Counter Support  - 1 x daily - 7 x weekly - 3 sets - 10 reps - Standing Hip Extension with Counter Support  - 1 x daily - 7 x weekly - 3 sets - 10 reps - Supine Bridge  - 1 x daily - 7 x weekly - 1-3 sets - 10 reps - Seated Piriformis Stretch with Trunk Bend  - 2 x daily - 7 x weekly - 1 sets - 3 reps - 30-60 sec  hold  ASSESSMENT:  CLINICAL IMPRESSION: Session focused on increasing pt's reps with exercises. Pt able to tolerate well. Re-checked pt for BPPV. Found very mild BPPV bilat. Provided Semont maneuver this session.   OBJECTIVE IMPAIRMENTS: Abnormal gait, decreased activity tolerance, decreased balance, decreased mobility, difficulty walking, decreased strength, dizziness, increased fascial restrictions, increased muscle spasms, and pain.   ACTIVITY LIMITATIONS: lifting, bending, squatting, transfers, bed mobility, locomotion level, and caring for others  PARTICIPATION LIMITATIONS: cleaning, laundry, shopping, and community activity  PERSONAL FACTORS: Age, Fitness, Past/current experiences, and Time since onset of injury/illness/exacerbation are also affecting patient's functional outcome.   REHAB POTENTIAL: Good  CLINICAL DECISION MAKING: Evolving/moderate complexity  EVALUATION COMPLEXITY: Moderate   GOALS: Goals reviewed with patient? Yes  SHORT TERM GOALS: Target date: 06/20/2022  Pt will be ind with HEP Baseline: Goal status: IN PROGRESS  2.  Pt will demo 5x  STS to </=20 sec for improved functional LE strength Baseline:  Goal status: IN PROGRESS    LONG TERM GOALS: Target date: 07/18/2022   Pt will be ind with progression and advancement of HEP Baseline:  Goal status: IN PROGRESS  2.  Pt 5xSTS will be </= 15 sec to demo decreased fall  risk Baseline:  Goal status: IN PROGRESS  3.  Pt will improve Berg Balance score to at least 52/56 to demo low fall risk Baseline:  Goal status: IN PROGRESS  4.  Pt will be able to amb at least 15-20 min daily to improve endurance and activity tolerance Baseline:  Goal status: IN PROGRESS  5.  Pt will demo no s/s of BPPV with canalith testing for improved balance Baseline:  Goal status: IN PROGRESS      PLAN:  PT FREQUENCY: 2x/week  PT DURATION: 8 weeks  PLANNED INTERVENTIONS: Therapeutic exercises, Therapeutic activity, Neuromuscular re-education, Balance training, Gait training, Patient/Family education, Self Care, Joint mobilization, Vestibular training, Canalith repositioning, Aquatic Therapy, Electrical stimulation, Cryotherapy, Moist heat, Taping, Ionotophoresis '4mg'$ /ml Dexamethasone, Manual therapy, and Re-evaluation  PLAN FOR NEXT SESSION: Screen BPPV. Check adductor and hip flexor flexibility.  Work on hip strengthening and balance with eyes closed/foam.    Truda Staub April Ma L Ronda Kazmi, PT, DPT 06/11/2022, 1:12 PM

## 2022-07-07 ENCOUNTER — Ambulatory Visit: Payer: Medicare HMO | Admitting: Cardiovascular Disease

## 2022-07-11 ENCOUNTER — Ambulatory Visit: Payer: Medicare HMO | Admitting: Podiatry

## 2022-07-11 ENCOUNTER — Ambulatory Visit (INDEPENDENT_AMBULATORY_CARE_PROVIDER_SITE_OTHER): Payer: Medicare HMO

## 2022-07-11 ENCOUNTER — Ambulatory Visit: Payer: Medicare HMO | Attending: Cardiovascular Disease | Admitting: Cardiovascular Disease

## 2022-07-11 ENCOUNTER — Encounter: Payer: Self-pay | Admitting: Cardiovascular Disease

## 2022-07-11 VITALS — BP 126/82 | HR 86 | Ht 60.0 in | Wt 121.0 lb

## 2022-07-11 DIAGNOSIS — M85871 Other specified disorders of bone density and structure, right ankle and foot: Secondary | ICD-10-CM | POA: Diagnosis not present

## 2022-07-11 DIAGNOSIS — B351 Tinea unguium: Secondary | ICD-10-CM

## 2022-07-11 DIAGNOSIS — M7751 Other enthesopathy of right foot: Secondary | ICD-10-CM | POA: Diagnosis not present

## 2022-07-11 DIAGNOSIS — I2510 Atherosclerotic heart disease of native coronary artery without angina pectoris: Secondary | ICD-10-CM

## 2022-07-11 DIAGNOSIS — I10 Essential (primary) hypertension: Secondary | ICD-10-CM

## 2022-07-11 DIAGNOSIS — E785 Hyperlipidemia, unspecified: Secondary | ICD-10-CM

## 2022-07-11 DIAGNOSIS — I3139 Other pericardial effusion (noninflammatory): Secondary | ICD-10-CM | POA: Diagnosis not present

## 2022-07-11 DIAGNOSIS — L600 Ingrowing nail: Secondary | ICD-10-CM | POA: Diagnosis not present

## 2022-07-11 DIAGNOSIS — M25571 Pain in right ankle and joints of right foot: Secondary | ICD-10-CM

## 2022-07-11 DIAGNOSIS — M7989 Other specified soft tissue disorders: Secondary | ICD-10-CM | POA: Diagnosis not present

## 2022-07-11 MED ORDER — DOXYCYCLINE HYCLATE 100 MG PO TABS: 100.0000 mg | ORAL_TABLET | Freq: Two times a day (BID) | ORAL | 0 refills | Status: DC

## 2022-07-11 NOTE — Progress Notes (Signed)
Cardiology Office Note:    Date:  07/11/2022   ID:  Kathleen [name], DOB 1941/07/14, MRN [id number]  PCP:  Hali Marry, MD   [facility] [facility] Prov[facility] Cardiologist:  Sherren Mocha, MD     Referring MD: Hali Marry, *   Chief Complaint  Patient presents with   Coronary Artery Disease    History of Present Illness:    Kathleen [name] is a 81 y.o. female with a hx of coronary artery disease, pericardial effusion, and mixed hyperlipidemia, presenting for follow-up evaluation.  The patient established with me in June 2023 after incidentally being found to have a pericardial effusion.  She was noted to have an abnormal EKG with age-indeterminate septal infarct.  An echocardiogram demonstrated a moderate pericardial effusion with no evidence of tamponade.  LVEF was moderately reduced at 40 to 45% with hypokinesis of the apical anterior wall.  She was found to have severe proximal LAD stenosis by coronary CTA in July 2023 and because of symptoms of marked fatigue and mild exertional dyspnea, was ultimately referred for cardiac catheterization.  This confirmed severe proximal LAD stenosis and she was treated with a drug-eluting stent platform.  Her intracardiac pressures were normal by right and left heart catheterization.  She was seen in follow-up in August when she continued to complain of fatigue.   She is here alone today. She continues to have marked fatigue. Feels like she 'tires out' very easily. No orthopnea, PND, edema, chest pain, or shortness of breath.  She continues to have problems with her left shoulder and looks forward to having shoulder surgery when she is cleared.  She denies lightheadedness or presyncope.  She has no other complaints.  Past Medical History:  Diagnosis Date   Cancer ([place])    Hx RT breast infiltrated ductal CA previously on tamoxifen and armidex    Herpes simplex    lt eye   Hypertension    Kidney stones    history   MVP (mitral valve  prolapse)    asymptomatic   Personal history of chemotherapy    Personal history of radiation therapy     Past Surgical History:  Procedure Laterality Date   ABDOMINAL HYSTERECTOMY     partial   BREAST BIOPSY     BREAST LUMPECTOMY Right 2000   RT   CERVICAL LAMINECTOMY  1986   CORONARY STENT INTERVENTION N/A 02/10/2022   Procedure: CORONARY STENT INTERVENTION;  Surgeon: Sherren Mocha, MD;  Location: [facility];  Service: Cardiovascular;  Laterality: N/A;   RIGHT/LEFT HEART CATH AND CORONARY ANGIOGRAPHY N/A 02/10/2022   Procedure: RIGHT/LEFT HEART CATH AND CORONARY ANGIOGRAPHY;  Surgeon: Sherren Mocha, MD;  Location: [facility];  Service: Cardiovascular;  Laterality: N/A;   SHOULDER SURGERY     rotator cuff repair   throidectomy  1988   For cancer     Current Medications: Current Meds  Medication Sig   Ascorbic Acid (VITAMIN C) 100 MG tablet Take 100 mg by mouth daily.   aspirin EC 81 MG tablet Take 81 mg by mouth daily.   atorvastatin (LIPITOR) 80 MG tablet TAKE ONE (1) TABLET BY MOUTH EVERY DAY   Cholecalciferol (VITAMIN D) 125 MCG (5000 UT) CAPS Take 5,000 Units by mouth daily.   clopidogrel (PLAVIX) 75 MG tablet Take 1 tablet (75 mg total) by mouth daily.   doxycycline (VIBRA-TABS) 100 MG tablet Take 1 tablet (100 mg total) by mouth 2 (two) times daily.   Ferrous Sulfate (  IRON PO) Take 65 mg by mouth daily.   ibuprofen (ADVIL) 800 MG tablet TAKE ONE TABLET EVERY 8 HOURS AS NEEDED   levothyroxine (SYNTHROID) 75 MCG tablet TAKE ONE (1) TABLET BY MOUTH EACH DAY   Multiple Vitamins-Minerals (MULTIVITAMIN WITH MINERALS) tablet Take 6 tablets by mouth daily. Balance of nature   TURMERIC PO Take 1 capsule by mouth daily.   valACYclovir (VALTREX) 500 MG tablet TAKE ONE (1) TABLET BY MOUTH EVERY DAY   Zinc Sulfate (ZINC 15 PO) Take 1 tablet by mouth daily.   [DISCONTINUED] Coenzyme Q10 (COQ10 PO) Take 1 capsule by mouth daily.   [DISCONTINUED] mupirocin ointment  (BACTROBAN) 2 % Apply to inside of each nares daily for 10 days then twice a week for maintenance.   [DISCONTINUED] pantoprazole (PROTONIX) 20 MG tablet Take 20 mg by mouth daily.     Allergies:   Streptomycin, Amlodipine, Codeine, Elemental sulfur, Penicillins, Sulfa antibiotics, Sulfonamide derivatives, Benicar [olmesartan], Hctz [hydrochlorothiazide], and Lisinopril   Social History   Socioeconomic History   Marital status: Married    Spouse name: Patrick Jupiter   Number of children: 1   Years of education: 12   Highest education level: 12th grade  Occupational History   Occupation: Working full-time at [employer].  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  Other Topics Concern   Not on file  Social History Narrative   Lives with her husband. She has one adopted daughter and two grandchildren. She has been working a lot but she enjoys Psychiatric nurse.    Social Determinants of Health   Financial Resource Strain: Low Risk  (04/12/2021)   Overall Financial Resource Strain (CARDIA)    Difficulty of Paying Living Expenses: Not hard at all  Food Insecurity: No Food Insecurity (04/12/2021)   Hunger Vital Sign    Worried About Running Out of Food in the Last Year: Never true    Ran Out of Food in the Last Year: Never true  Transportation Needs: No Transportation Needs (04/12/2021)   PRAPARE - Hydrologist (Medical): No    Lack of Transportation (Non-Medical): No  Physical Activity: Inactive (04/12/2021)   Exercise Vital Sign    Days of Exercise per Week: 0 days    Minutes of Exercise per Session: 0 min  Stress: No Stress Concern Present (04/12/2021)   [place]    Feeling of Stress : Only a little  Social Connections: Moderately Isolated (04/12/2021)   Social Connection and Isolation Panel [NHANES]    Frequency of Communication with Friends and Family: More than three times a week    Frequency of Social Gatherings with Friends and Family: Three times a week    Attends Religious Services: Never    Active Member of Clubs or Organizations: No    Attends Archivist Meetings: Never    Marital Status: Married     Family History: The patient's family history includes Breast cancer in her mother.  ROS:   Please see the history of present illness.    All other systems reviewed and are negative.  EKGs/Labs/Other Studies Reviewed:    The following studies  were reviewed today: Echo 01/08/2022: 1. Mild diffuse hypokinesis with focal wall motion abnormalities noted.  Left ventricular ejection fraction, by estimation, is 40 to 45%. The left  ventricle has mildly decreased function. The left ventricle demonstrates  regional wall motion abnormalities   (see scoring diagram/findings for description). Left ventricular  diastolic function could not be evaluated. There is moderate hypokinesis  of the left ventricular, mid-apical anteroseptal wall, anterior wall,  anterolateral wall and apical segment.   2. Right ventricular systolic function is normal. The right ventricular  size is normal. There is moderately elevated pulmonary artery systolic  pressure. The estimated right ventricular systolic pressure is 40.9 mmHg.   3. Left atrial size was moderately dilated.   4. Right atrial size was mildly dilated.   5. Moderate pericardial effusion. The pericardial effusion is  circumferential. There is no evidence of cardiac tamponade.   6. The mitral valve is abnormal. Mild to moderate mitral valve  regurgitation.   7. Tricuspid valve regurgitation is moderate.   8. The aortic valve is tricuspid. Aortic valve  regurgitation is trivial.  No aortic stenosis is present.   9. The inferior vena cava is normal in size with greater than 50%  respiratory variability, suggesting right atrial pressure of 3 mmHg.   Comparison(s): Prior images unable to be directly viewed, comparison made  by report only. Changes from prior study are noted. Prior EF/wall motion  reported as normal.   Conclusion(s)/Recommendation(s): Globally reduced LVEF, with additional  focal wall motion abnormalities apically (most prominent anteroseptal,  anterior, and anterolateral segments apex-mid region). Moderately elevated  PA pressures. MR and TR as noted.   Cardiac Cath 02/10/2022:   [place] lesion is 40% stenosed.   Prox LAD to Mid LAD lesion is 95% stenosed.   Mid LAD to Dist LAD lesion is 50% stenosed.   A drug-eluting stent was successfully placed using a SYNERGY XD 2.50X32.   Post intervention, there is a 0% residual stenosis.   1.  Severe proximal LAD stenosis, treated successfully with PCI using a 2.5 x 32 mm Synergy DES 2.  Widely patent, dominant left circumflex with no significant stenosis 3.  Patent, nondominant RCA (small vessel) 4.  Essentially normal right heart hemodynamics, normal LVEDP, normal wedge pressure, with no hemodynamic evidence of cardiac tamponade  Recommendations: Aspirin and clopidogrel at least 6 months, aggressive risk reduction measures, overnight observation with plans for discharge tomorrow as long as no complications arise.  EKG:  EKG is not ordered today.   Recent Labs: 11/05/2021: TSH 0.51 02/18/2022: ALT 26 03/06/2022: BUN 29; Creatinine, Ser 0.88; Hemoglobin 13.4; Platelets 158; Potassium 4.1; Sodium 138  Recent Lipid Panel    Component Value Date/Time   CHOL 122 02/18/2022 1151   TRIG 56 02/18/2022 1151   TRIG 79 07/25/2009 0000   HDL 52 02/18/2022 1151   CHOLHDL 2.3 02/18/2022 1151   CHOLHDL 2.4 04/03/2021 0000   VLDL 13 04/16/2016 0958   LDLCALC 58 02/18/2022 1151   LDLCALC  68 04/03/2021 0000     Risk Assessment/Calculations:                Physical Exam:    VS:  BP 126/82   Pulse 86   Ht 5' (1.524 m)   Wt 121 lb (54.9 kg)   SpO2 98%   BMI 23.63 kg/m     Wt Readings from Last 3 Encounters:  07/11/22 121 lb (54.9 kg)  04/02/22 111 lb 6.4 oz (50.5 kg)  03/12/22  110 lb (49.9 kg)     GEN:  Well nourished, well developed in no acute distress HEENT: Normal NECK: No JVD; No carotid bruits LYMPHATICS: No lymphadenopathy CARDIAC: RRR, no murmurs, rubs, gallops RESPIRATORY:  Clear to auscultation without rales, wheezing or rhonchi  ABDOMEN: Soft, non-tender, non-distended MUSCULOSKELETAL:  No edema; No deformity  SKIN: Warm and dry NEUROLOGIC:  Alert and oriented x 3 PSYCHIATRIC:  Normal affect   ASSESSMENT:    1. Coronary artery disease involving native coronary artery of native heart without angina pectoris   2. Essential hypertension   3. Hyperlipidemia LDL goal <70   4. Pericardial effusion    PLAN:    In order of problems listed above:  The patient is stable with no symptoms of angina.  She should continue on DAPT with aspirin and clopidogrel for a total of 6 months.  She can discontinue clopidogrel after August 13, 2022.  She should remain on low-dose aspirin.  She appears clinically stable with no exertional symptoms and specifically no chest pain.  I think she can proceed with left shoulder surgery without any further ischemic testing.  She should be at low risk of cardiac complications.  Her functional capacity is well in excess of 4 metabolic equivalents. Blood pressure well-controlled.  Not currently requiring any medical therapy. Treated with atorvastatin 80 mg daily.  Seems to be tolerating this well.  Last LDL cholesterol in August of this year is 58 mg/dL.  LFTs are normal.  Continue same therapy. Again, appears to be asymptomatic.  Will follow-up an echocardiogram to make sure her pericardial effusion is not enlarging.  She  has no symptoms attributable to this.     Medication Adjustments/Labs and Tests Ordered: Current medicines are reviewed at length with the patient today.  Concerns regarding medicines are outlined above.  Orders Placed This Encounter  Procedures   ECHOCARDIOGRAM COMPLETE   No orders of the defined types were placed in this encounter.   Patient Instructions  Medication Instructions:  MAY DISCONTINUE Clopidogrel (Plavix) on 08/13/22 *If you need a refill on your cardiac medications before your next appointment, please call your pharmacy*  Lab Work: NONE If you have labs (blood work) drawn today and your tests are completely normal, you will receive your results only by: [place] (if you have MyChart) OR A paper copy in the mail If you have any lab test that is abnormal or we need to change your treatment, we will call you to review the results.  Testing/Procedures: ECHO Your physician has requested that you have an echocardiogram. Echocardiography is a painless test that uses sound waves to create images of your heart. It provides your doctor with information about the size and shape of your heart and how well your heart's chambers and valves are working. This procedure takes approximately one hour. There are no restrictions for this procedure. Please do NOT wear cologne, perfume, aftershave, or lotions (deodorant is allowed). Please arrive 15 minutes prior to your appointment time.  Follow-Up: At [facility], you and your health needs are our priority.  As part of our continuing mission to provide you with exceptional heart care, we have created designated Provider Care Teams.  These Care Teams include your primary Cardiologist (physician) and Advanced Practice Providers (APPs -  Physician Assistants and Nurse Practitioners) who all work together to provide you with the care you need, when you need it.  We recommend signing up for the patient portal called "MyChart".   Sign  up information is provided on this After Visit Summary.  MyChart is used to connect with patients for Virtual Visits (Telemedicine).  Patients are able to view lab/test results, encounter notes, upcoming appointments, etc.  Non-urgent messages can be sent to your provider as well.   To learn more about what you can do with MyChart, go to [email or web address].    Your next appointment:   6 month(s)  The format for your next appointment:   In Person  Provider: Madie Reno   Other Instructions **Cleared for shoulder surgery after 08/13/22 (upon discontinuation of Clopidogrel)**  Important Information About Sugar         Signed, Sherren Mocha, MD  07/11/2022 3:42 PM    [place]

## 2022-07-11 NOTE — Patient Instructions (Addendum)
I have ordered a medication for you that will come from [place] in [place]. They should be calling you to verify insurance and will mail the medication to you. If you live close by then you can go by their pharmacy to pick up the medication. Their phone number is [phone]. If you do not hear from them in the next few days, please give [place] a call at [phone].    Soak Instructions    THE DAY AFTER THE PROCEDURE  Place 1/4 cup of epsom salts in a quart of warm tap water.  Submerge your foot or feet with outer bandage intact for the initial soak; this will allow the bandage to become moist and wet for easy lift off.  Once you remove your bandage, continue to soak in the solution for 20 minutes.  This soak should be done twice a day.  Next, remove your foot or feet from solution, blot dry the affected area and cover.  You may use a band aid large enough to cover the area or use gauze and tape.  Apply other medications to the area as directed by the doctor such as polysporin neosporin.  IF YOUR SKIN BECOMES IRRITATED WHILE USING THESE INSTRUCTIONS, IT IS OKAY TO SWITCH TO  WHITE VINEGAR AND WATER. Or you may use antibacterial soap and water to keep the toe clean  Monitor for any signs/symptoms of infection. Call the office immediately if any occur or go directly to the emergency room. Call with any questions/concerns. Ingrown Toenail  An ingrown toenail occurs when the corner or sides of a toenail grow into the surrounding skin. This causes discomfort and pain. The big toe is most commonly affected, but any of the toes can be affected. If an ingrown toenail is not treated, it can become infected. What are the causes? This condition may be caused by: Wearing shoes that are too small or tight. An injury, such as stubbing your toe or having your toe stepped on. Improper cutting or care of your toenails. Having nail or foot abnormalities that were present from birth (congenital  abnormalities), such as having a nail that is too big for your toe. What increases the risk? The following factors may make you more likely to develop ingrown toenails: Age. Nails tend to get thicker with age, so ingrown nails are more common among older people. Cutting your toenails incorrectly, such as cutting them very short or cutting them unevenly. An ingrown toenail is more likely to get infected if you have: Diabetes. Blood flow (circulation) problems. What are the signs or symptoms? Symptoms of an ingrown toenail may include: Pain, soreness, or tenderness. Redness. Swelling. Hardening of the skin that surrounds the toenail. Signs that an ingrown toenail may be infected include: Fluid or pus. Symptoms that get worse. How is this diagnosed? Ingrown toenails may be diagnosed based on: Your symptoms and medical history. A physical exam. Labs or tests. If you have fluid or blood coming from your toenail, a sample may be collected to test for the specific type of bacteria that is causing the infection. How is this treated? Treatment depends on the severity of your symptoms. You may be able to care for your toenail at home. If you have an infection, you may be prescribed antibiotic medicines. If you have fluid or pus draining from your toenail, your health care provider may drain it. If you have trouble walking, you may be given crutches to use. If you have a severe or infected  ingrown toenail, you may need a procedure to remove part or all of the nail. Follow these instructions at home: [place] your wound every day for signs of infection, or as often as told by your health care provider. Check for: More redness, swelling, or pain. More fluid or blood. Warmth. Pus or a bad smell. Do not pick at your toenail or try to remove it yourself. Soak your foot in warm, soapy water. Do this for 20 minutes, 3 times a day, or as often as told by your health care provider. This  helps to keep your toe clean and your skin soft. Wear shoes that fit well and are not too tight. Your health care provider may recommend that you wear open-toed shoes while you heal. Trim your toenails regularly and carefully. Cut your toenails straight across to prevent injury to the skin at the corners of the toenail. Do not cut your nails in a curved shape. Keep your feet clean and dry to help prevent infection. General instructions Take over-the-counter and prescription medicines only as told by your health care provider. If you were prescribed an antibiotic, take it as told by your health care provider. Do not stop taking the antibiotic even if you start to feel better. If your health care provider told you to use crutches to help you move around, use them as instructed. Return to your normal activities as told by your health care provider. Ask your health care provider what activities are safe for you. Keep all follow-up visits. This is important. Contact a health care provider if: You have more redness, swelling, pain, or other symptoms that do not improve with treatment. You have fluid, blood, or pus coming from your toenail. You have a red streak on your skin that starts at your foot and spreads up your leg. You have a fever. Summary An ingrown toenail occurs when the corner or sides of a toenail grow into the surrounding skin. This causes discomfort and pain. The big toe is most commonly affected, but any of the toes can be affected. If an ingrown toenail is not treated, it can become infected. Fluid or pus draining from your toenail is a sign of infection. Your health care provider may need to drain it. You may be given antibiotics to treat the infection. Trimming your toenails regularly and properly can help you prevent an ingrown toenail. This information is not intended to replace advice given to you by your health care provider. Make sure you discuss any questions you have with  your health care provider. Document Revised: 11/06/2020 Document Reviewed: 11/06/2020 Elsevier Patient Education  [place].

## 2022-07-11 NOTE — Patient Instructions (Signed)
Medication Instructions:  MAY DISCONTINUE Clopidogrel (Plavix) on 08/13/22 *If you need a refill on your cardiac medications before your next appointment, please call your pharmacy*  Lab Work: NONE If you have labs (blood work) drawn today and your tests are completely normal, you will receive your results only by: [place] (if you have MyChart) OR A paper copy in the mail If you have any lab test that is abnormal or we need to change your treatment, we will call you to review the results.  Testing/Procedures: ECHO Your physician has requested that you have an echocardiogram. Echocardiography is a painless test that uses sound waves to create images of your heart. It provides your doctor with information about the size and shape of your heart and how well your heart's chambers and valves are working. This procedure takes approximately one hour. There are no restrictions for this procedure. Please do NOT wear cologne, perfume, aftershave, or lotions (deodorant is allowed). Please arrive 15 minutes prior to your appointment time.  Follow-Up: At [facility], you and your health needs are our priority.  As part of our continuing mission to provide you with exceptional heart care, we have created designated Provider Care Teams.  These Care Teams include your primary Cardiologist (physician) and Advanced Practice Providers (APPs -  Physician Assistants and Nurse Practitioners) who all work together to provide you with the care you need, when you need it.  We recommend signing up for the patient portal called "MyChart".  Sign up information is provided on this After Visit Summary.  MyChart is used to connect with patients for Virtual Visits (Telemedicine).  Patients are able to view lab/test results, encounter notes, upcoming appointments, etc.  Non-urgent messages can be sent to your provider as well.   To learn more about what you can do with MyChart, go to [email or web address].     Your next appointment:   6 month(s)  The format for your next appointment:   In Person  Provider: Madie Reno   Other Instructions **Cleared for shoulder surgery after 08/13/22 (upon discontinuation of Clopidogrel)**  Important Information About Sugar

## 2022-07-11 NOTE — Progress Notes (Unsigned)
Subjective:   Patient ID: Kathleen [name], female   DOB: 81 y.o.   MRN: [id number]   HPI Chief Complaint  Patient presents with   Ankle Pain    Right ankle pain on going for about 6 months. Pain is a constant dual pain.   Nail Problem     Possible fungus some thickness & discoloration   81 year old female presents the office today with the above concerns.  Her main concern is ankle pain on the lateral aspect of the ankle which been ongoing for 6 months.  She gets some swelling to discuss a constant dull pain.  No injuries that she reports to her ankle.  She had a injury couple months prior to this but did not affect her ankle and mostly her shoulder, arm.  She has had no recent treatment for this.  Her nails are also thickened discolored.  No swelling redness or drainage.  No recent treatment.  No open lesions.   Review of Systems  All other systems reviewed and are negative.  Past Medical History:  Diagnosis Date   Cancer ([place])    Hx RT breast infiltrated ductal CA previously on tamoxifen and armidex    Herpes simplex    lt eye   Hypertension    Kidney stones    history   MVP (mitral valve prolapse)    asymptomatic   Personal history of chemotherapy    Personal history of radiation therapy     Past Surgical History:  Procedure Laterality Date   ABDOMINAL HYSTERECTOMY     partial   BREAST BIOPSY     BREAST LUMPECTOMY Right 2000   RT   CERVICAL LAMINECTOMY  1986   CORONARY STENT INTERVENTION N/A 02/10/2022   Procedure: CORONARY STENT INTERVENTION;  Surgeon: Sherren Mocha, MD;  Location: [facility];  Service: Cardiovascular;  Laterality: N/A;   RIGHT/LEFT HEART CATH AND CORONARY ANGIOGRAPHY N/A 02/10/2022   Procedure: RIGHT/LEFT HEART CATH AND CORONARY ANGIOGRAPHY;  Surgeon: Sherren Mocha, MD;  Location: [facility];  Service: Cardiovascular;  Laterality: N/A;   SHOULDER SURGERY     rotator cuff repair   throidectomy  1988   For cancer      Current  Outpatient Medications:    doxycycline (VIBRA-TABS) 100 MG tablet, Take 1 tablet (100 mg total) by mouth 2 (two) times daily., Disp: 14 tablet, Rfl: 0   Ascorbic Acid (VITAMIN C) 100 MG tablet, Take 100 mg by mouth daily., Disp: , Rfl:    aspirin EC 81 MG tablet, Take 81 mg by mouth daily., Disp: , Rfl:    atorvastatin (LIPITOR) 80 MG tablet, TAKE ONE (1) TABLET BY MOUTH EVERY DAY, Disp: 90 tablet, Rfl: 3   Cholecalciferol (VITAMIN D) 125 MCG (5000 UT) CAPS, Take 5,000 Units by mouth daily., Disp: , Rfl:    clopidogrel (PLAVIX) 75 MG tablet, Take 1 tablet (75 mg total) by mouth daily., Disp: 90 tablet, Rfl: 3   Ferrous Sulfate (IRON PO), Take 65 mg by mouth daily., Disp: , Rfl:    ibuprofen (ADVIL) 800 MG tablet, TAKE ONE TABLET EVERY 8 HOURS AS NEEDED, Disp: 60 tablet, Rfl: 1   levothyroxine (SYNTHROID) 75 MCG tablet, TAKE ONE (1) TABLET BY MOUTH EACH DAY, Disp: 90 tablet, Rfl: 2   Multiple Vitamins-Minerals (MULTIVITAMIN WITH MINERALS) tablet, Take 6 tablets by mouth daily. Balance of nature, Disp: , Rfl:    TURMERIC PO, Take 1 capsule by mouth daily., Disp: , Rfl:  valACYclovir (VALTREX) 500 MG tablet, TAKE ONE (1) TABLET BY MOUTH EVERY DAY, Disp: 30 tablet, Rfl: 6   Zinc Sulfate (ZINC 15 PO), Take 1 tablet by mouth daily., Disp: , Rfl:   Allergies  Allergen Reactions   Streptomycin Other (See Comments)    Passed out   Amlodipine Other (See Comments)    Dizziness on '5mg'$     Codeine Other (See Comments)    Unknown   Elemental Sulfur     Other reaction(s): Other (See Comments) Pt not sure   Penicillins Other (See Comments)    Pass out   Sulfa Antibiotics Other (See Comments)    Unknown   Sulfonamide Derivatives Other (See Comments)    Unknown   Benicar [Olmesartan] Other (See Comments)    Diizzy   Hctz [Hydrochlorothiazide] Other (See Comments)    Vertigo, dizziness.    Lisinopril Other (See Comments)    lightheaded            Objective:  Physical Exam  General: AAO  x3, NAD  Dermatological: Nails are hypertrophic, dystrophic regular, brown discoloration.  There is incurvation of the toenail with localized edema and erythema.  No purulence.  No ascending cellulitis.  No open lesions.  Vascular: Dorsalis Pedis artery and Posterior Tibial artery pedal pulses are 2/4 bilateral with immedate capillary fill time.There is no pain with calf compression, swelling, warmth, erythema.   Neruologic: Grossly intact via light touch bilateral.   Musculoskeletal: On the right ankle there is tenderness palpation on the lateral aspect of this along the distal portion just inferior to the lateral malleolus.  There is no gross ankle instability present.  No significant pain to the sinus tarsi.  No pain or crepitation with ankle or subtalar joint range of motion.  There is some mild edema there is no erythema or warmth.  Gait: Unassisted, Nonantalgic.       Assessment:   81 year old female with capsulitis, onychomycosis, ingrown toenail with localized erythema     Plan:  -Treatment options discussed including all alternatives, risks, and complications -Etiology of symptoms were discussed -X-rays were obtained and reviewed.  I did review the x-rays.  No acute findings noted.  Slight narrowing of the ankle joint. -Regards to the ankle dispensed a compression anklet.  Ice, topical Voltaren as needed.  If no improvement consider advanced imaging. -Ordered a compound cream [facility] for nail fungus -Given ingrown toenail localized erythema I did sharply debride the nail and complications.  Recommend antibiotic ointment dressing changes.  Epsom salt soaks.  Prescribed doxycycline.  Monitoring signs or symptoms of infection.  Should symptoms continue or worsen may need to proceed with partial nail avulsion.  Trula Slade DPM   Order cc- fungus keflex

## 2022-08-12 ENCOUNTER — Ambulatory Visit (HOSPITAL_COMMUNITY): Payer: Medicare HMO | Attending: Internal Medicine

## 2022-08-12 DIAGNOSIS — I10 Essential (primary) hypertension: Secondary | ICD-10-CM | POA: Diagnosis not present

## 2022-08-12 DIAGNOSIS — E785 Hyperlipidemia, unspecified: Secondary | ICD-10-CM | POA: Diagnosis not present

## 2022-08-12 DIAGNOSIS — I3139 Other pericardial effusion (noninflammatory): Secondary | ICD-10-CM

## 2022-08-12 DIAGNOSIS — I2510 Atherosclerotic heart disease of native coronary artery without angina pectoris: Secondary | ICD-10-CM | POA: Diagnosis not present

## 2022-08-14 LAB — ECHOCARDIOGRAM COMPLETE
Area-P 1/2: 1.64 cm2
S' Lateral: 2.9 cm

## 2022-08-20 ENCOUNTER — Other Ambulatory Visit: Payer: Self-pay

## 2022-08-20 ENCOUNTER — Telehealth: Payer: Self-pay | Admitting: Cardiovascular Disease

## 2022-08-20 NOTE — Telephone Encounter (Signed)
Patient was returning call. Please advise ?

## 2022-08-20 NOTE — Telephone Encounter (Signed)
Pt advised her Echo results and verbalized understanding... she will stop her Plavix and I sent her OV note and Echo to the Orthopaedic Surgeon Dr Marcene Duos.

## 2022-08-21 ENCOUNTER — Telehealth: Payer: Self-pay | Admitting: Cardiovascular Disease

## 2022-08-21 NOTE — Telephone Encounter (Signed)
Patient states she is returning an additional call from Judson Roch to discuss echo results.

## 2022-08-27 NOTE — Telephone Encounter (Signed)
Donnalee Curry K 08/21/2022  8:26 AM EST     Rockney Ghee, RN reviewed ECHO results with patient. Details listed in separate encounter.

## 2022-09-03 DIAGNOSIS — L821 Other seborrheic keratosis: Secondary | ICD-10-CM | POA: Diagnosis not present

## 2022-09-03 DIAGNOSIS — S20319A Abrasion of unspecified front wall of thorax, initial encounter: Secondary | ICD-10-CM | POA: Diagnosis not present

## 2022-09-03 DIAGNOSIS — L718 Other rosacea: Secondary | ICD-10-CM | POA: Diagnosis not present

## 2022-09-03 DIAGNOSIS — L910 Hypertrophic scar: Secondary | ICD-10-CM | POA: Diagnosis not present

## 2022-09-03 DIAGNOSIS — I788 Other diseases of capillaries: Secondary | ICD-10-CM | POA: Diagnosis not present

## 2022-09-03 DIAGNOSIS — L720 Epidermal cyst: Secondary | ICD-10-CM | POA: Diagnosis not present

## 2022-11-10 ENCOUNTER — Other Ambulatory Visit: Payer: Self-pay | Admitting: Family Medicine

## 2022-11-11 ENCOUNTER — Ambulatory Visit: Payer: Medicare HMO | Admitting: Orthopedic Surgery

## 2022-11-11 ENCOUNTER — Encounter: Payer: Self-pay | Admitting: Orthopedic Surgery

## 2022-11-11 DIAGNOSIS — M12812 Other specific arthropathies, not elsewhere classified, left shoulder: Secondary | ICD-10-CM | POA: Diagnosis not present

## 2022-11-11 DIAGNOSIS — G8929 Other chronic pain: Secondary | ICD-10-CM

## 2022-11-11 DIAGNOSIS — M25512 Pain in left shoulder: Secondary | ICD-10-CM

## 2022-11-11 NOTE — Progress Notes (Signed)
Office Visit Note   Patient: Kathleen [name]           Date of Birth: 1940/10/21           MRN: [id number] Visit Date: 11/11/2022 Requested by: Agapito Games, MD [address] [place],  [place] [address] PCP: Agapito Games, MD  Subjective: Chief Complaint  Patient presents with   Left Shoulder - Pain    Wants to discuss surgery    HPI: Kathleen [name] is a 82 y.o. female who presents to the office reporting left shoulder pain.  She has known left shoulder rotator cuff arthropathy.  Patient sustained a fall last year and had irreparable rotator cuff tendon tears.  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  Describes worsening pain.  Husband recently had open heart surgery 2 weeks ago.  He will require assistance for about 6 months according to the cardiology physicians.  Patient does have a history of having stent placement as well as having had a stroke in 2018..                ROS: All systems reviewed are negative as they relate to the chief complaint within the history of present illness.  Patient denies fevers or chills.  Assessment & Plan: Visit Diagnoses:  1. Chronic left shoulder pain   2. Rotator cuff arthropathy of left shoulder     Plan: Impression is left shoulder rotator cuff arthropathy with reasonably functioning right shoulder.  The left arm has diminished strength as well as some pain with any type of attempted overhead motion.  Reverse shoulder replacement in her current situation would keep her out of commission in terms of helping to deal with her husband for at least 6 weeks.  She would be in her sling for 2 to 2 weeks and then working on range of motion and strengthening for the next 4 weeks.  Would not really be able to help him during that time.  Also the risk of surgery including the stress of elective surgery on a patient with a  history of having had stent placement as well as a stroke is also to be considered.  Her age in the 88s is also a mitigating factor against elective surgery.  Nonetheless she is very symptomatic from her shoulder problem which started after a fall last year.  She is going to consider her options but at this time I think she is going to try to manage and live with what she has.  She will follow-up as needed.  Follow-Up Instructions: No follow-ups on file.   Orders:  No orders of the defined types were placed in this encounter.  No orders of the defined types were placed in this encounter.     Procedures: No procedures performed   Clinical Data: No additional findings.  Objective: Vital Signs: There were no vitals taken for this visit.  Physical Exam:  Constitutional: Patient appears well-developed HEENT:  Head: Normocephalic Eyes:EOM are normal Neck: Normal range of motion Cardiovascular: Normal rate Pulmonary/chest: Effort normal Neurologic: Patient is alert Skin: Skin is warm Psychiatric: Patient has normal mood and affect  Ortho Exam: Ortho exam demonstrates diminished strength and functional range of motion of that left shoulder which is equivalent to her prior exam.  Again cannot really AB duct or forward flex above 90  degrees on that left-hand side and she does have pain with attempts.  Rotator cuff weakness is present.  Deltoid is functional.  Specialty Comments:  No specialty comments available.  Imaging: No results found.   PMFS History: Patient Active Problem List   Diagnosis Date Noted   Orthostasis 03/12/2022   S/P coronary artery stent placement 03/12/2022   Abnormal cardiac CT angiography 02/10/2022   Complete tear of left rotator cuff 01/01/2022   Gross hematuria 12/04/2021   Impingement syndrome of left shoulder 12/02/2021   Radial styloid fracture 09/17/2021   Lumbar foraminal stenosis 09/12/2021   Weight loss 07/30/2021   Restrictive lung disease  05/15/2021   S/P stroke due to cerebrovascular disease 09/17/2018   Primary osteoarthritis of left hip 07/08/2017   Primary osteoarthritis of both knees 02/25/2017   Lumbar spondylosis 12/03/2016   Internal derangement of left knee 10/09/2016   History of breast cancer 11/09/2012   Benign hematuria 07/05/2010   POSTMENOPAUSAL STATUS 03/29/2010   IMPAIRED FASTING GLUCOSE 12/11/2008   LEG PAIN, BILATERAL 01/20/2008   HIP PAIN, RIGHT 01/05/2008   Disorder of bone and cartilage 02/26/2007   Hypothyroidism 04/28/2006   Essential hypertension 04/28/2006   MITRAL VALVE DISORDER 04/28/2006   Past Medical History:  Diagnosis Date   Cancer ([facility])    Hx RT breast infiltrated ductal CA previously on tamoxifen and armidex    Herpes simplex    lt eye   Hypertension    Kidney stones    history   MVP (mitral valve prolapse)    asymptomatic   Personal history of chemotherapy    Personal history of radiation therapy     Family History  Problem Relation Age of Onset   Breast cancer Mother     Past Surgical History:  Procedure Laterality Date   ABDOMINAL HYSTERECTOMY     partial   BREAST BIOPSY     BREAST LUMPECTOMY Right 2000   RT   CERVICAL LAMINECTOMY  1986   CORONARY STENT INTERVENTION N/A 02/10/2022   Procedure: CORONARY STENT INTERVENTION;  Surgeon: Tonny Bollman, MD;  [facility]: [facility];  Service: Cardiovascular;  Laterality: N/A;   RIGHT/LEFT HEART CATH AND CORONARY ANGIOGRAPHY N/A 02/10/2022   Procedure: RIGHT/LEFT HEART CATH AND CORONARY ANGIOGRAPHY;  Surgeon: Tonny Bollman, MD;  Location: [facility];  Service: Cardiovascular;  Laterality: N/A;   SHOULDER SURGERY     rotator cuff repair   throidectomy  1988   For cancer    Social History   Occupational History   Occupation: Working full-time at [employer].  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

## 2022-11-14 ENCOUNTER — Telehealth: Payer: Self-pay | Admitting: Orthopedic Surgery

## 2022-11-14 NOTE — Telephone Encounter (Signed)
Patient stats she was supposed to have a letter typed up for her Otila Kluver stating her medical standing with her Shoulder and her fall, and also give detail about shoulder surgery and how long it may take to recover and the steps she will need it take during recovery like PT. Pleas advise stated she will be back in [place] on Monday being her husband is having surgery pease advise

## 2022-11-16 NOTE — Telephone Encounter (Signed)
Can we just print off her office note from when she saw Dr. August Saucer and have it waiting for her at the front desk?  Not sure if this will suffice

## 2022-11-17 NOTE — Telephone Encounter (Signed)
Told husband to have her call back and we can print that office visit note for her

## 2022-12-18 NOTE — Progress Notes (Deleted)
Cardiology Office Note:    Date:  12/18/2022   ID:  Kathleen [name], DOB [birth year], MRN [id number]  PCP:  Kathleen Games, MD   [facility] Providers Cardiologist:  Kathleen Bollman, MD     Referring MD: Kathleen [name], *   Chief Complaint: ***   History of Present Illness:    Kathleen [name] is a very pleasant 82 y.o. female with a hx of pericardial effusion, CAD s/p PCI/DES to LAD, and hyperlipidemia.   Established care with Dr. Excell [name] on 12/20/2021 after being referred by PCP ration of pericardial effusion. Had CT of abdomen and pelvis 11/2021 for gross hematuria, incidentally found to have small pericardial effusion which is new from remote prior imaging. EKG revealed NSR at 71 bpm, frequent PVCs, LAD, age-indeterminate septal infarct.  Echocardiogram revealed moderate pericardial effusion with no evidence of tamponade, LVEF 40 to 45%, mild diffuse hypokinesis with focal wall motion abnormalities, moderate hypokinesis of left ventricle, mid apical anterior septal wall, anterior wall, anterior lateral wall, and apical segment, mild to moderate mitral valve regurgitation, moderate TR, function normal but moderately elevated PA systolic pressure 50 mmHg.  She was advised to undergo a gated coronary CTA due to the pattern of wall motion abnormality concerning for stenosis in LAD territory.   Coronary CTA 01/28/2022 revealed severe proximal LAD stenosis. She had not previously reported symptoms concerning for angina so was scheduled to return to see Dr. Excell [name] on 02/03/2022.  She denied chest pain, but reported marked fatigue and generalized weakness for several months, mild shortness of breath with exertion.  Decision making, she and Dr. Excell [name] agreed that she should undergo right and left heart catheterization for evaluation of pulmonary hypertension and pericardial effusion on echo as well as concern for LAD stenosis.    Right and left heart catheterization on 02/10/2022 that revealed  severe proximal LAD stenosis treated successfully with PCI/DES, mid LAD to dist LAD 50% stenosed, ramus lesion 40%, widely patent dominant left circumflex with good stenosis, patent nondominant RCA (small vessel), essentially normal right heart hemodynamics, normal LVEDP, normal wedge pressure with no hemodynamic evidence of cardiac tamponade. Advised to continue aspirin and clopidogrel for at least 6 months.   Seen for hospital follow-up by me on 02/18/22 and reported fatigue. No further hematuria noted.  Rare occasion of chest soreness. Was previously very active working at [employer] and taking care of her home. Encouraged to gradually increase activity. BP was mildly elevated and she was advised to monitor for consistent hypertension.   She called 03/14/22 to report lightheadedness and BP dropping when she stands.  She was advised by PCP to reduce metoprolol to 12.5 mg daily and if BP remained low she could stop Toprol altogether.  Seen in clinic by Dr. Excell [name] 07/11/22 and reported continued fatigue.  Advised she could discontinue clopidogrel after August 13, 2022.  He agreed she could proceed with left shoulder surgery without further ischemic testing.  He repeated her echo on 08/14/2022 which showed pericardial effusion now small (was previously moderate) overall stable findings.  Today, she is here   Past Medical History:  Diagnosis Date   Cancer (HCC)    Hx RT breast infiltrated ductal CA previously on tamoxifen and armidex    Herpes simplex    lt eye   Hypertension    Kidney stones    history   MVP (mitral valve prolapse)    asymptomatic   Personal history of chemotherapy    Personal history of radiation therapy  Past Surgical History:  Procedure Laterality Date   ABDOMINAL HYSTERECTOMY     partial   BREAST BIOPSY     BREAST LUMPECTOMY Right 2000   RT   CERVICAL LAMINECTOMY  1986   CORONARY STENT INTERVENTION N/A 02/10/2022   Procedure: CORONARY STENT INTERVENTION;   Surgeon: Kathleen Bollman, MD;  Location: [facility] INVASIVE CV LAB;  Service: Cardiovascular;  Laterality: N/A;   RIGHT/LEFT HEART CATH AND CORONARY ANGIOGRAPHY N/A 02/10/2022   Procedure: RIGHT/LEFT HEART CATH AND CORONARY ANGIOGRAPHY;  Surgeon: Kathleen Bollman, MD;  Location: [facility] INVASIVE CV LAB;  Service: Cardiovascular;  Laterality: N/A;   SHOULDER SURGERY     rotator cuff repair   throidectomy  1988   For cancer     Current Medications: No outpatient medications have been marked as taking for the 12/19/22 encounter (Appointment) with Kathleen [name], Kathleen George, NP.     Allergies:   Streptomycin, Amlodipine, Codeine, Elemental sulfur, Penicillins, Sulfa antibiotics, Sulfonamide derivatives, Benicar [olmesartan], Hctz [hydrochlorothiazide], and Lisinopril   Social History   Socioeconomic History   Marital status: Married    Spouse name: Kathleen [name]   Number of children: 1   Years of education: 12   Highest education level: 12th grade  Occupational History   Occupation: Working full-time at [employer].  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  Other Topics Concern   Not on file  Social History Narrative   Lives with her husband. She has one adopted daughter and two grandchildren. She has been working a lot but she enjoys Copywriter, advertising.    Social Determinants of Health   Financial Resource Strain: Low Risk  (04/12/2021)   Overall Financial Resource Strain (CARDIA)    Difficulty of Paying Living Expenses: Not hard at all  Food Insecurity: No Food Insecurity (04/12/2021)   Hunger Vital Sign    Worried About Running Out of Food in the Last Year: Never true    Ran Out of Food in the Last Year: Never true  Transportation Needs: No  Transportation Needs (04/12/2021)   PRAPARE - Administrator, Civil Service (Medical): No    Lack of Transportation (Non-Medical): No  Physical Activity: Inactive (04/12/2021)   Exercise Vital Sign    Days of Exercise per Week: 0 days    Minutes of Exercise per Session: 0 min  Stress: No Stress Concern Present (04/12/2021)   [employer] of Occupational Health - Occupational Stress Questionnaire    Feeling of Stress : Only a little  Social Connections: Moderately Isolated (04/12/2021)   Social Connection and Isolation Panel [NHANES]    Frequency of Communication with Friends and Family: More than three times a week    Frequency of Social Gatherings with Friends and Family: Three times a week    Attends Religious Services: Never    Active Member of Clubs or Organizations: No    Attends Banker Meetings: Never    Marital Status: Married     Family History: The patient's family history includes Breast cancer in her mother.  ROS:   Please see the history of present illness.   *** All other systems reviewed and are negative.  Labs/Other Studies Reviewed:  The following studies were reviewed today:  Echo 08/14/22 1. Left ventricular ejection fraction, by estimation, is 40 to 45%. The  left ventricle has mildly decreased function. The left ventricle  demonstrates regional wall motion abnormalities (see scoring  diagram/findings for description). There is mild left  ventricular hypertrophy. Left ventricular diastolic parameters are  consistent with Grade I diastolic dysfunction (impaired relaxation).   2. Right ventricular systolic function is normal. The right ventricular  size is normal. There is normal pulmonary artery systolic pressure. The  estimated right ventricular systolic pressure is 31.1 mmHg.   3. Left atrial size was mildly dilated.   4. A small pericardial effusion is present. The pericardial effusion is  circumferential. There is no  evidence of cardiac tamponade.   5. The mitral valve is degenerative. Trivial mitral valve regurgitation.  No evidence of mitral stenosis.   6. The aortic valve is grossly normal. There is mild thickening of the  aortic valve. Aortic valve regurgitation is not visualized. No aortic  stenosis is present.   7. The inferior vena cava is normal in size with greater than 50%  respiratory variability, suggesting right atrial pressure of 3 mmHg.    R/LHC 02/10/22    Ramus lesion is 40% stenosed.   Prox LAD to Mid LAD lesion is 95% stenosed.   Mid LAD to Dist LAD lesion is 50% stenosed.   A drug-eluting stent was successfully placed using a SYNERGY XD [id number].   Post intervention, there is a 0% residual stenosis.   1.  Severe proximal LAD stenosis, treated successfully with PCI using a 2.5 x 32 mm Synergy DES 2.  Widely patent, dominant left circumflex with no significant stenosis 3.  Patent, nondominant RCA (small vessel) 4.  Essentially normal right heart hemodynamics, normal LVEDP, normal wedge pressure, with no hemodynamic evidence of cardiac tamponade  Recommendations: Aspirin and clopidogrel at least 6 months, aggressive risk reduction measures, overnight observation with plans for discharge tomorrow as long as no complications arise.  CCTA 01/28/22  1. Obstructive CAD, with mixed plaque in the proximal LAD causing severe (70-99%) stenosis. Cardiac catheterization recommended   2. Coronary calcium score of 300. This was 67th percentile for age and sex matched control.   3.  Normal coronary origins with left dominance   4. Artifact due to PVC during acquisition. With ECG editing, coronary arteries are interpretable except for distal LCX   5. Moderate pericardial effusion measuring up to 1.5cm adjacent to right atrium   6.  PFO  Echo 01/08/22   1. Mild diffuse hypokinesis with focal wall motion abnormalities noted.  Left ventricular ejection fraction, by estimation, is 40 to  45%. The left  ventricle has mildly decreased function. The left ventricle demonstrates  regional wall motion abnormalities   (see scoring diagram/findings for description). Left ventricular  diastolic function could not be evaluated. There is moderate hypokinesis  of the left ventricular, mid-apical anteroseptal wall, anterior wall,  anterolateral wall and apical segment.   2. Right ventricular systolic function is normal. The right ventricular  size is normal. There is moderately elevated pulmonary artery systolic  pressure. The estimated right ventricular systolic pressure is 49.8 mmHg.   3. Left atrial size was moderately dilated.   4. Right atrial size was mildly dilated.   5. Moderate pericardial effusion. The pericardial effusion is  circumferential. There is no evidence of cardiac tamponade.   6. The mitral valve is abnormal. Mild to moderate mitral valve  regurgitation.   7.  Tricuspid valve regurgitation is moderate.   8. The aortic valve is tricuspid. Aortic valve regurgitation is trivial.  No aortic stenosis is present.   9. The inferior vena cava is normal in size with greater than 50%  respiratory variability, suggesting right atrial pressure of 3 mmHg.   Comparison(s): Prior images unable to be directly viewed, comparison made  by report only. Changes from prior study are noted. Prior EF/wall motion  reported as normal.    Recent Labs: 02/18/2022: ALT 26 03/06/2022: BUN 29; Creatinine, Ser 0.88; Hemoglobin 13.4; Platelets 158; Potassium 4.1; Sodium 138  Recent Lipid Panel    Component Value Date/Time   CHOL 122 02/18/2022 1151   TRIG 56 02/18/2022 1151   TRIG 79 07/25/2009 0000   HDL 52 02/18/2022 1151   CHOLHDL 2.3 02/18/2022 1151   CHOLHDL 2.4 04/03/2021 0000   VLDL 13 04/16/2016 0958   LDLCALC 58 02/18/2022 1151   LDLCALC 68 04/03/2021 0000     Risk Assessment/Calculations:       Physical Exam:    VS:  There were no vitals taken for this visit.    Wt  Readings from Last 3 Encounters:  07/11/22 121 lb (54.9 kg)  04/02/22 111 lb 6.4 oz (50.5 kg)  03/12/22 110 lb (49.9 kg)     GEN:  Well nourished, well developed in no acute distress HEENT: Normal NECK: No JVD; No carotid bruits CARDIAC: Irregular RR, no murmurs, rubs, gallops RESPIRATORY:  Clear to auscultation without rales, wheezing or rhonchi  ABDOMEN: Soft, non-tender, non-distended MUSCULOSKELETAL:  No edema; No deformity. 2+ pedal pulses, equal bilaterally SKIN: Warm and dry. Yellowish-purple bruising left forearm, radial cath insertion  NEUROLOGIC:  Alert and oriented x 3 PSYCHIATRIC:  Normal affect   EKG:  EKG is ***  Diagnoses:    No diagnosis found.  Assessment and Plan:     CAD s/p PCI/DES to LAD: Abnormal echocardiogram 01/08/22 revealed regional wall motion abnormalities consistent in the LAD territory.  Cardiac catheterization 02/10/2022 revealed severe proximal LAD stenosis treated with PCI/DES x1.  Widely patent dominant left circumflex with no significant stenosis as well as patent nondominant RCA. Advised to continue uninterrupted DAPT x 6 months. No bleeding concerns. Continue metoprolol, clopidogrel, aspirin, Lipitor.  Pericardial effusion: Echo 08/14/22 reveals small pericardial effusion with no evidence of cardiac tamponade. Mild to moderate on echo 01/08/22 with normal hemodynamics by cath. Advised at discharge no further therapy needed per Dr. [place]. She is asymptomatic today.  Advised her to notify us if she develops chest pain, shortness of breath, discomfort with position changes or other concerns.   Hypertension: SBP mildly elevated today. History of whitecoat hypertension. Does not monitor BP consistently at home but has a cuff.  Advised her to monitor BP on a consistent basis and report back to [place] in approximately 2 weeks. Would favor additional anti-hypertensive if BP consistently > 135/80.  Reports prior intolerances of lisinopril, HCTZ, Benicar,  amlodipine. Would favor initiation of low dose CCB or ARB with careful titration.   Hyperlipidemia LDL goal < 70: LDL 68 on 04/03/2021.  Normal LPA at 63.7. Will check lipid panel and LFTs today. Continue Lipitor.    Disposition: ***  Medication Adjustments/Labs and Tests Ordered: Current medicines are reviewed at length with the patient today.  Concerns regarding medicines are outlined above.  No orders of the defined types were placed in this encounter.  No orders of the defined types were placed in this encounter.   There are no  Patient Instructions on file for this visit.   Signed, Levi Aland, NP  12/18/2022 8:49 AM    [facility]

## 2022-12-19 ENCOUNTER — Encounter: Payer: Self-pay | Admitting: Nurse Practitioner

## 2022-12-19 ENCOUNTER — Ambulatory Visit: Payer: Medicare HMO | Attending: Nurse Practitioner | Admitting: Nurse Practitioner

## 2023-01-07 ENCOUNTER — Encounter: Payer: Self-pay | Admitting: Cardiovascular Disease

## 2023-01-07 ENCOUNTER — Ambulatory Visit: Payer: Medicare HMO | Attending: Cardiovascular Disease | Admitting: Cardiovascular Disease

## 2023-01-07 VITALS — BP 122/80 | HR 71 | Ht 60.0 in | Wt 115.4 lb

## 2023-01-07 DIAGNOSIS — I3139 Other pericardial effusion (noninflammatory): Secondary | ICD-10-CM

## 2023-01-07 DIAGNOSIS — I2510 Atherosclerotic heart disease of native coronary artery without angina pectoris: Secondary | ICD-10-CM | POA: Diagnosis not present

## 2023-01-07 DIAGNOSIS — I10 Essential (primary) hypertension: Secondary | ICD-10-CM

## 2023-01-07 DIAGNOSIS — E785 Hyperlipidemia, unspecified: Secondary | ICD-10-CM

## 2023-01-07 NOTE — Patient Instructions (Addendum)
Medication Instructions:  Your physician recommends that you continue on your current medications as directed. Please refer to the Current Medication list given to you today.  *If you need a refill on your cardiac medications before your next appointment, please call your pharmacy*  Lab Work: If you have labs (blood work) drawn today and your tests are completely normal, you will receive your results only by: MyChart Message (if you have MyChart) OR A paper copy in the mail If you have any lab test that is abnormal or we need to change your treatment, we will call you to review the results.  Testing/Procedures: Your physician has requested that you have an echocardiogram. Echocardiography is a painless test that uses sound waves to create images of your heart. It provides your doctor with information about the size and shape of your heart and how well your heart's chambers and valves are working. This procedure takes approximately one hour. There are no restrictions for this procedure. Please do NOT wear cologne, perfume, aftershave, or lotions (deodorant is allowed). Please arrive 15 minutes prior to your appointment time.  Follow-Up: At [facility], you and your health needs are our priority.  As part of our continuing mission to provide you with exceptional heart care, we have created designated Provider Care Teams.  These Care Teams include your primary Cardiologist (physician) and Advanced Practice Providers (APPs -  Physician Assistants and Nurse Practitioners) who all work together to provide you with the care you need, when you need it.  We recommend signing up for the patient portal called "MyChart".  Sign up information is provided on this After Visit Summary.  MyChart is used to connect with patients for Virtual Visits (Telemedicine).  Patients are able to view lab/test results, encounter notes, upcoming appointments, etc.  Non-urgent messages can be sent to your provider as  well.   To learn more about what you can do with MyChart, go to [email or web address].    Your next appointment:   6 months   Provider:   Michael Cooper, MD      

## 2023-01-07 NOTE — Progress Notes (Signed)
Cardiology Office Note:    Date:  01/07/2023   ID:  Kathleen [name], DOB 10-16-1940, MRN [id number]  PCP:  Agapito Games, MD   [facility] Providers Cardiologist:  Tonny Bollman, MD     Referring MD: Agapito Games, *   Chief Complaint  Patient presents with   Coronary Artery Disease    History of Present Illness:    Shayna Furrer is a 82 y.o. female with a hx of coronary artery disease, pericardial effusion, and mixed hyperlipidemia, presenting for follow-up evaluation.  The patient established with me in June 2023 after incidentally being found to have a pericardial effusion.  She was noted to have an abnormal EKG with age-indeterminate septal infarct.  An echocardiogram demonstrated a moderate pericardial effusion with no evidence of tamponade.  LVEF was moderately reduced at 40 to 45% with hypokinesis of the apical anterior wall.  She was found to have severe proximal LAD stenosis by coronary CTA in July 2023 and because of symptoms of marked fatigue and mild exertional dyspnea, was ultimately referred for cardiac catheterization.  This confirmed severe proximal LAD stenosis and she was treated with a drug-eluting stent platform.  Her intracardiac pressures were normal by right and left heart catheterization.   The patient is here with her husband today.  She has been under a lot of stress, caring for him after he had mitral valve replacement.  He has not gotten back to his previous level of functioning yet as he is only 6 or 7 weeks out from surgery.  The patient denies any recent symptoms of chest pain, chest pressure, heart palpitations, orthopnea, PND, or leg swelling.  She reports a relatively low blood pressure and she has been unable to tolerate medical therapy for LV dysfunction.  On the minimal medical program, she gets along pretty well.  She really misses being able to work.  Today is her birthday.  Past Medical History:  Diagnosis Date   Cancer (HCC)     Hx RT breast infiltrated ductal CA previously on tamoxifen and armidex    Herpes simplex    lt eye   Hypertension    Kidney stones    history   MVP (mitral valve prolapse)    asymptomatic   Personal history of chemotherapy    Personal history of radiation therapy     Past Surgical History:  Procedure Laterality Date   ABDOMINAL HYSTERECTOMY     partial   BREAST BIOPSY     BREAST LUMPECTOMY Right 2000   RT   CERVICAL LAMINECTOMY  1986   CORONARY STENT INTERVENTION N/A 02/10/2022   Procedure: CORONARY STENT INTERVENTION;  Surgeon: Tonny Bollman, MD;  Location: [facility] INVASIVE CV LAB;  Service: Cardiovascular;  Laterality: N/A;   RIGHT/LEFT HEART CATH AND CORONARY ANGIOGRAPHY N/A 02/10/2022   Procedure: RIGHT/LEFT HEART CATH AND CORONARY ANGIOGRAPHY;  Surgeon: Tonny Bollman, MD;  Location: [facility] INVASIVE CV LAB;  Service: Cardiovascular;  Laterality: N/A;   SHOULDER SURGERY     rotator cuff repair   throidectomy  1988   For cancer     Current Medications: Current Meds  Medication Sig   Ascorbic Acid (VITAMIN C) 100 MG tablet Take 100 mg by mouth daily.   aspirin EC 81 MG tablet Take 81 mg by mouth daily.   atorvastatin (LIPITOR) 80 MG tablet TAKE ONE (1) TABLET BY MOUTH EVERY DAY   Cholecalciferol (VITAMIN D) 125 MCG (5000 UT) CAPS Take 5,000 Units by mouth daily.  doxycycline (VIBRA-TABS) 100 MG tablet Take 1 tablet (100 mg total) by mouth 2 (two) times daily.   Ferrous Sulfate (IRON PO) Take 65 mg by mouth daily.   ibuprofen (ADVIL) 800 MG tablet TAKE ONE TABLET EVERY 8 HOURS AS NEEDED   levothyroxine (SYNTHROID) 75 MCG tablet TAKE ONE (1) TABLET BY MOUTH EACH DAY   Multiple Vitamins-Minerals (MULTIVITAMIN WITH MINERALS) tablet Take 6 tablets by mouth daily. Balance of nature   TURMERIC PO Take 1 capsule by mouth daily.   valACYclovir (VALTREX) 500 MG tablet TAKE ONE (1) TABLET BY MOUTH EVERY DAY   Zinc Sulfate (ZINC 15 PO) Take 1 tablet by mouth daily.     Allergies:    Streptomycin, Amlodipine, Codeine, Elemental sulfur, Penicillins, Sulfa antibiotics, Sulfonamide derivatives, Benicar [olmesartan], Hctz [hydrochlorothiazide], and Lisinopril   Social History   Socioeconomic History   Marital status: Married    Spouse name: Deniece Portela   Number of children: 1   Years of education: 12   Highest education level: 12th grade  Occupational History   Occupation: Working full-time at [employer].  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  Other Topics Concern   Not on file  Social History Narrative   Lives with her husband. She has one adopted daughter and two grandchildren. She has been working a lot but she enjoys Copywriter, advertising.    Social Determinants of Health   Financial Resource Strain: Low Risk  (04/12/2021)   Overall Financial Resource Strain (CARDIA)    Difficulty of Paying Living Expenses: Not hard at all  Food Insecurity: No Food Insecurity (04/12/2021)   Hunger Vital Sign    Worried About Running Out of Food in the Last Year: Never true    Ran Out of Food in the Last Year: Never true  Transportation Needs: No Transportation Needs (04/12/2021)   PRAPARE - Administrator, Civil Service (Medical): No    Lack of Transportation (Non-Medical): No  Physical Activity: Inactive (04/12/2021)   Exercise Vital Sign    Days of Exercise per Week: 0 days    Minutes of Exercise per Session: 0 min  Stress: No Stress Concern Present (04/12/2021)   [employer] of Occupational Health - Occupational Stress Questionnaire    Feeling of Stress : Only a little  Social Connections: Moderately Isolated (04/12/2021)   Social Connection and Isolation Panel [NHANES]    Frequency of Communication  with Friends and Family: More than three times a week    Frequency of Social Gatherings with Friends and Family: Three times a week    Attends Religious Services: Never    Active Member of Clubs or Organizations: No    Attends Banker Meetings: Never    Marital Status: Married     Family History: The patient's family history includes Breast cancer in her mother.  ROS:   Please see the history of present illness.    All other systems reviewed and are negative.  EKGs/Labs/Other Studies Reviewed:    The following studies were reviewed today: Cardiac Studies & Procedures   CARDIAC CATHETERIZATION  CARDIAC CATHETERIZATION 02/10/2022  Narrative   Ramus lesion is 40% stenosed.   Prox LAD to Mid  LAD lesion is 95% stenosed.   Mid LAD to Dist LAD lesion is 50% stenosed.   A drug-eluting stent was successfully placed using a SYNERGY XD 2.50X32.   Post intervention, there is a 0% residual stenosis.  1.  Severe proximal LAD stenosis, treated successfully with PCI using a 2.5 x 32 mm Synergy DES 2.  Widely patent, dominant left circumflex with no significant stenosis 3.  Patent, nondominant RCA (small vessel) 4.  Essentially normal right heart hemodynamics, normal LVEDP, normal wedge pressure, with no hemodynamic evidence of cardiac tamponade  Recommendations: Aspirin and clopidogrel at least 6 months, aggressive risk reduction measures, overnight observation with plans for discharge tomorrow as long as no complications arise.  Findings Coronary Findings Diagnostic  Dominance: Left  Left Anterior Descending Prox LAD to Mid LAD lesion is 95% stenosed. The lesion is moderately calcified. The LAD has a tight proximal stenosis of 95%.  The remainder of the vessel is underfilled and diffusely diseased.  The diagonal branches are small in caliber. Mid LAD to Dist LAD lesion is 50% stenosed. Diffuse mid to distal LAD stenosis is present  Ramus Intermedius Ramus lesion is 40%  stenosed. The ramus branch covers a fairly large territory.  There is mild nonobstructive plaquing in the proximal vessel at the origin of a small perforating branch.  The perforating branch has an 80% ostial stenosis  Left Circumflex Vessel is large. The vessel exhibits minimal luminal irregularities. Large vessel, mild nonobstructive plaquing noted with no significant stenosis.  The branch vessels of the circumflex are patent.  The circumflex is dominant.  Right Coronary Artery There is mild diffuse disease throughout the vessel. Small, nondominant vessel with no significant stenosis.  Intervention  Prox LAD to Mid LAD lesion Stent CATH VISTA GUIDE 6FR XBLAD3.5 guide catheter was inserted. Lesion crossed with guidewire using a WIRE COUGAR XT STRL 190CM. Pre-stent angioplasty was performed. A drug-eluting stent was successfully placed using a SYNERGY XD 2.50X32. Post-stent angioplasty was performed using a BALL SAPPHIRE NC24 [id number]. Maximum pressure:  16 atm. Initially, the vessel is underfilled.  Following stenting there is improved distal flow.  The distal vessel is diffusely diseased and the stent is landed in a healthy area of the vessel.  All of the branch vessels remain patent. Post-Intervention Lesion Assessment The intervention was successful. Pre-interventional TIMI flow is 3. Post-intervention TIMI flow is 3. No complications occurred at this lesion. There is a 0% residual stenosis post intervention.     ECHOCARDIOGRAM  ECHOCARDIOGRAM COMPLETE 08/14/2022  Narrative ECHOCARDIOGRAM REPORT    Patient Name:   Kathleen [name]  Date of Exam: 08/12/2022 Medical Rec #:  [id number]     Height:       60.0 in Accession #:    [id number]    Weight:       121.0 lb Date of Birth:  1941-03-23     BSA:          1.508 m Patient Age:    81 years      BP:           126/82 mmHg Patient Gender: F             HR:           73 bpm. Exam Location:  [address]  Procedure: 2D Echo, Cardiac Doppler  and Color Doppler  Indications:    I25.10 CAD  History:        Patient has prior history of Echocardiogram examinations, most recent 01/08/2022.  CAD, Pericardial effusion; Risk Factors:Hypertension and Dyslipidemia.  Sonographer:    Samule Ohm RDCS Referring Phys: [phone] Zyden Suman  IMPRESSIONS   1. Left ventricular ejection fraction, by estimation, is 40 to 45%. The left ventricle has mildly decreased function. The left ventricle demonstrates regional wall motion abnormalities (see scoring diagram/findings for description). There is mild left ventricular hypertrophy. Left ventricular diastolic parameters are consistent with Grade I diastolic dysfunction (impaired relaxation). 2. Right ventricular systolic function is normal. The right ventricular size is normal. There is normal pulmonary artery systolic pressure. The estimated right ventricular systolic pressure is 31.1 mmHg. 3. Left atrial size was mildly dilated. 4. A small pericardial effusion is present. The pericardial effusion is circumferential. There is no evidence of cardiac tamponade. 5. The mitral valve is degenerative. Trivial mitral valve regurgitation. No evidence of mitral stenosis. 6. The aortic valve is grossly normal. There is mild thickening of the aortic valve. Aortic valve regurgitation is not visualized. No aortic stenosis is present. 7. The inferior vena cava is normal in size with greater than 50% respiratory variability, suggesting right atrial pressure of 3 mmHg.  FINDINGS Left Ventricle: Left ventricular ejection fraction, by estimation, is 40 to 45%. The left ventricle has mildly decreased function. The left ventricle demonstrates regional wall motion abnormalities. The left ventricular internal cavity size was normal in size. There is mild left ventricular hypertrophy. Left ventricular diastolic parameters are consistent with Grade I diastolic dysfunction (impaired relaxation).   LV Wall Scoring: The  mid and distal anterior wall and apical septal segment are akinetic. The mid and distal lateral wall and mid anteroseptal segment are hypokinetic.  Right Ventricle: The right ventricular size is normal. No increase in right ventricular wall thickness. Right ventricular systolic function is normal. There is normal pulmonary artery systolic pressure. The tricuspid regurgitant velocity is 2.65 m/s, and with an assumed right atrial pressure of 3 mmHg, the estimated right ventricular systolic pressure is 31.1 mmHg.  Left Atrium: Left atrial size was mildly dilated.  Right Atrium: Right atrial size was normal in size. Prominent Chiari network.  Pericardium: A small pericardial effusion is present. The pericardial effusion is circumferential. There is no evidence of cardiac tamponade.  Mitral Valve: The mitral valve is degenerative in appearance. There is moderate calcification of the mitral valve leaflet(s). Trivial mitral valve regurgitation. No evidence of mitral valve stenosis.  Tricuspid Valve: The tricuspid valve is normal in structure. Tricuspid valve regurgitation is mild . No evidence of tricuspid stenosis.  Aortic Valve: The aortic valve is grossly normal. There is mild thickening of the aortic valve. Aortic valve regurgitation is not visualized. No aortic stenosis is present.  Pulmonic Valve: The pulmonic valve was normal in structure. Pulmonic valve regurgitation is trivial. No evidence of pulmonic stenosis.  Aorta: The aortic root is normal in size and structure.  Venous: The inferior vena cava is normal in size with greater than 50% respiratory variability, suggesting right atrial pressure of 3 mmHg.  IAS/Shunts: No atrial level shunt detected by color flow Doppler.   LEFT VENTRICLE PLAX 2D LVIDd:         4.10 cm   Diastology LVIDs:         2.90 cm   LV e' medial:    3.81 cm/s LV PW:         1.30 cm   LV E/e' medial:  16.5 LV IVS:        1.00 cm   LV e' lateral:   4.24  cm/s LVOT diam:     2.00 cm   LV E/e' lateral: 14.8 LV SV:         57 LV SV Index:   38 LVOT Area:     3.14 cm   RIGHT VENTRICLE            IVC RV S prime:     9.57 cm/s  IVC diam: 0.90 cm TAPSE (M-mode): 1.7 cm RVSP:           31.1 mmHg  LEFT ATRIUM           Index        RIGHT ATRIUM           Index LA diam:      3.10 cm 2.06 cm/m   RA Pressure: 3.00 mmHg LA Vol (A4C): 55.0 ml 36.48 ml/m  RA Area:     11.50 cm RA Volume:   25.60 ml  16.98 ml/m AORTIC VALVE LVOT Vmax:   83.00 cm/s LVOT Vmean:  54.200 cm/s LVOT VTI:    0.180 m  AORTA Ao Root diam: 3.00 cm Ao Asc diam:  2.90 cm  MITRAL VALVE                TRICUSPID VALVE MV Area (PHT): 1.64 cm     TR Peak grad:   28.1 mmHg MV Decel Time: 463 msec     TR Vmax:        265.00 cm/s MV E velocity: 62.90 cm/s   Estimated RAP:  3.00 mmHg MV A velocity: 139.00 cm/s  RVSP:           31.1 mmHg MV E/A ratio:  0.45 SHUNTS Systemic VTI:  0.18 m Systemic Diam: 2.00 cm  Weston Brass MD Electronically signed by Weston Brass MD Signature Date/Time: 08/14/2022/5:35:30 AM    Final     CT SCANS  CT CORONARY MORPH W/CTA COR W/SCORE 01/28/2022  Addendum 01/28/2022  4:29 PM ADDENDUM REPORT: 01/28/2022 16:27  ADDENDUM: OVER-READ INTERPRETATION  CT CHEST  The following report is an over-read performed by radiologist Dr. Lovie Chol [facility] on 01/28/2022. This over-read does not include interpretation of cardiac or coronary anatomy or pathology. The coronary calcium score and coronary CT angiography. Interpretation by the cardiologist is attached. Imaging of the chest is focused on cardiac structures and excludes much of the chest on CT.  COMPARISON:  None.  FINDINGS:  Cardiovascular: Signs of pericardial effusion, small to moderate. Aortic atherosclerosis. See dedicated cardiac imaging report for additional details.  Mediastinum/Nodes: No acute process or signs of adenopathy in  the mediastinum.  Lungs/Pleura: Basilar atelectasis. No effusion. No consolidation. Airways to the extent visualized are patent.  Upper Abdomen: No acute process in the upper abdomen.  Musculoskeletal: Spinal degenerative changes without acute or destructive bone finding.  IMPRESSION:  1. Signs of pericardial effusion, small to moderate. 2. Aortic atherosclerosis.  Aortic Atherosclerosis (ICD10-I70.0).   Electronically Signed By: Donzetta Kohut M.D. On: 01/28/2022 16:27  Narrative CLINICAL DATA:  29F with systolic dysfunction on echo (EF 40-45%)  EXAM: Cardiac/Coronary CTA  TECHNIQUE: The patient was scanned on a [employer].  FINDINGS: A 100 kV prospective scan was triggered in the descending thoracic aorta at 111 HU's. Axial non-contrast 3 mm slices were carried out through the heart. The data set was analyzed on a dedicated work station and scored using the Agatson method. Gantry rotation speed was 250 msecs and collimation was .6 mm. 0.8 mg of sl  NTG was given. The 3D data set was reconstructed in 5% intervals of the 35-75 % of the R-R cycle. Phases were analyzed on a dedicated work station using MPR, MIP and VRT modes. The patient received 80 cc of contrast.  Coronary Arteries:  Normal coronary origin.  Left dominance.  RCA is small and nondominant  Left main is a large artery that gives rise to LAD and LCX arteries.  LAD is a large vessel. Mixed plaque in proximal LAD causes severe (70-99%) stenosis.  LCX is a dominant artery that gives rise to one large OM1 branch. Calcified plaque in proximal LCX causes 0-24% stenosis. Calcified plaque in OM1 causes 0-24% stenosis. Distal LCX uninterpretable due to artifact from PVC during acquisition.  Other findings:  Left Ventricle: Normal size  Left Atrium: Normal size. PFO  Pulmonary Veins: Normal configuration  Right Ventricle: Normal size  Right Atrium: Normal size  Cardiac valves: Mild  MAC  Thoracic aorta: Normal size  Pulmonary Arteries: Normal size  Systemic Veins: Normal drainage  Pericardium: Moderate effusion measuring up to 1.5cm adjacent to right atrium  IMPRESSION: 1. Obstructive CAD, with mixed plaque in the proximal LAD causing severe (70-99%) stenosis. Cardiac catheterization recommended  2. Coronary calcium score of 300. This was 67th percentile for age and sex matched control.  3.  Normal coronary origins with left dominance  4. Artifact due to PVC during acquisition. With ECG editing, coronary arteries are interpretable except for distal LCX  5. Moderate pericardial effusion measuring up to 1.5cm adjacent to right atrium  6.  PFO  CAD-RADS 4 Severe stenosis. (70-99% or > 50% left main). Cardiac catheterization is recommended. Consider symptom-guided anti-ischemic pharmacotherapy as well as risk factor modification per guideline directed care.  Electronically Signed: By: Epifanio Lesches M.D. On: 01/28/2022 16:03           EKG:  EKG is ordered today.  The ekg ordered today demonstrates:  EKG Interpretation  Date/Time:  Wednesday January 07 2023 10:53:25 EDT Ventricular Rate:  71 PR Interval:  184 QRS Duration: 84 QT Interval:  362 QTC Calculation: 393 R Axis:   -22 Text Interpretation: Normal sinus rhythm Cannot rule out Anteroseptal infarct (cited on or before 10-Feb-2022) When compared with ECG of 06-Mar-2022 15:02, Premature ventricular complexes are no longer Present Otherwise no significant change Confirmed by Tonny Bollman [id number]) on 01/07/2023 11:10:41 AM         Recent Labs: 02/18/2022: ALT 26 03/06/2022: BUN 29; Creatinine, Ser 0.88; Hemoglobin 13.4; Platelets 158; Potassium 4.1; Sodium 138  Recent Lipid Panel    Component Value Date/Time   CHOL 122 02/18/2022 1151   TRIG 56 02/18/2022 1151   TRIG 79 07/25/2009 0000   HDL 52 02/18/2022 1151   CHOLHDL 2.3 02/18/2022 1151   CHOLHDL 2.4 04/03/2021 0000   VLDL 13  04/16/2016 0958   LDLCALC 58 02/18/2022 1151   LDLCALC 68 04/03/2021 0000     Risk Assessment/Calculations:                Physical Exam:    VS:  BP 122/80   Pulse 71   Ht 5' (1.524 m)   Wt 115 lb 6.4 oz (52.3 kg)   SpO2 97%   BMI 22.54 kg/m     Wt Readings from Last 3 Encounters:  01/07/23 115 lb 6.4 oz (52.3 kg)  07/11/22 121 lb (54.9 kg)  04/02/22 111 lb 6.4 oz (50.5 kg)     GEN:  Well nourished, well developed  in no acute distress HEENT: Normal NECK: No JVD; No carotid bruits LYMPHATICS: No lymphadenopathy CARDIAC: RRR, no murmurs, rubs, gallops RESPIRATORY:  Clear to auscultation without rales, wheezing or rhonchi  ABDOMEN: Soft, non-tender, non-distended MUSCULOSKELETAL:  No edema; No deformity  SKIN: Warm and dry NEUROLOGIC:  Alert and oriented x 3 PSYCHIATRIC:  Normal affect   ASSESSMENT:    1. Coronary artery disease involving native coronary artery of native heart without angina pectoris   2. Essential hypertension   3. Hyperlipidemia LDL goal <70   4. Pericardial effusion    PLAN:    In order of problems listed above:  The patient is stable with no symptoms of angina.  She remains on aspirin and atorvastatin.  Her EKG and echo are consistent with an old anteroseptal infarct that was likely unrecognized.  She has undergone PCI of the LAD now without any significant improvement in LV function. Blood pressure well-controlled on no medication at this time.  Unable to tolerate beta-blocker or ACE inhibitor for LV dysfunction. Treated with atorvastatin 80 mg.  LDL cholesterol 58. Small on last echo.  Recommend follow-up in January for a 1 year surveillance assessment.  This will also help [place] relook at her LV function.     Medication Adjustments/Labs and Tests Ordered: Current medicines are reviewed at length with the patient today.  Concerns regarding medicines are outlined above.  Orders Placed This Encounter  Procedures   EKG 12-Lead    ECHOCARDIOGRAM COMPLETE   No orders of the defined types were placed in this encounter.   Patient Instructions  Medication Instructions:  Your physician recommends that you continue on your current medications as directed. Please refer to the Current Medication list given to you today.  *If you need a refill on your cardiac medications before your next appointment, please call your pharmacy*  Lab Work: If you have labs (blood work) drawn today and your tests are completely normal, you will receive your results only by: MyChart Message (if you have MyChart) OR A paper copy in the mail If you have any lab test that is abnormal or we need to change your treatment, we will call you to review the results.  Testing/Procedures: Your physician has requested that you have an echocardiogram. Echocardiography is a painless test that uses sound waves to create images of your heart. It provides your doctor with information about the size and shape of your heart and how well your heart's chambers and valves are working. This procedure takes approximately one hour. There are no restrictions for this procedure. Please do NOT wear cologne, perfume, aftershave, or lotions (deodorant is allowed). Please arrive 15 minutes prior to your appointment time.  Follow-Up: At [facility], you and your health needs are our priority.  As part of our continuing mission to provide you with exceptional heart care, we have created designated Provider Care Teams.  These Care Teams include your primary Cardiologist (physician) and Advanced Practice Providers (APPs -  Physician Assistants and Nurse Practitioners) who all work together to provide you with the care you need, when you need it.  We recommend signing up for the patient portal called "MyChart".  Sign up information is provided on this After Visit Summary.  MyChart is used to connect with patients for Virtual Visits (Telemedicine).  Patients are able to view  lab/test results, encounter notes, upcoming appointments, etc.  Non-urgent messages can be sent to your provider as well.   To learn more about what you  can do with MyChart, go to [email or web address].    Your next appointment:   6 month(s)  Provider:   Tonny Bollman, MD        Signed, Tonny Bollman, MD  01/07/2023 1:35 PM    [facility]

## 2023-01-16 ENCOUNTER — Other Ambulatory Visit: Payer: Self-pay | Admitting: Orthopedic Surgery

## 2023-01-16 DIAGNOSIS — G8929 Other chronic pain: Secondary | ICD-10-CM

## 2023-01-27 DIAGNOSIS — H1131 Conjunctival hemorrhage, right eye: Secondary | ICD-10-CM | POA: Diagnosis not present

## 2023-02-03 ENCOUNTER — Ambulatory Visit (INDEPENDENT_AMBULATORY_CARE_PROVIDER_SITE_OTHER): Payer: Medicare HMO | Admitting: Family Medicine

## 2023-02-03 ENCOUNTER — Encounter: Payer: Self-pay | Admitting: Family Medicine

## 2023-02-03 VITALS — BP 167/68 | HR 77 | Ht 60.0 in | Wt 118.0 lb

## 2023-02-03 DIAGNOSIS — I10 Essential (primary) hypertension: Secondary | ICD-10-CM

## 2023-02-03 DIAGNOSIS — M7989 Other specified soft tissue disorders: Secondary | ICD-10-CM

## 2023-02-03 DIAGNOSIS — R7301 Impaired fasting glucose: Secondary | ICD-10-CM

## 2023-02-03 DIAGNOSIS — R5383 Other fatigue: Secondary | ICD-10-CM

## 2023-02-03 DIAGNOSIS — E039 Hypothyroidism, unspecified: Secondary | ICD-10-CM

## 2023-02-03 NOTE — Patient Instructions (Signed)
Please cut your atorvastatin in half and just take a half a tab nightly and see if you feel any better over the next couple of weeks.

## 2023-02-03 NOTE — Assessment & Plan Note (Signed)
Due to recheck TSH.  Having some pretty severe fatigue.

## 2023-02-03 NOTE — Progress Notes (Signed)
Established Patient Office Visit  Subjective   Patient ID: Kathleen [name], female    DOB: 1940/10/16  Age: 82 y.o. MRN: [id number]  Chief Complaint  Patient presents with   Follow-up    HPI Hypertension- Pt denies chest pain, SOB, dizziness, or heart palpitations.  Taking meds as directed w/o problems.  Denies medication side effects.    Impaired fasting glucose-no increased thirst or urination. No symptoms consistent with hypoglycemia.  Hypothyroidism - Taking medication regularly in the AM away from food and vitamins, etc. No recent change to skin, hair, or energy levels.  She just feels very fatigued.  She is also under a lot of stress as her granddaughter is living with her and her husband but has not been really helping around the house.  Her husband has been more irritable and still has a lot of shortness of breath so she worries about leaving him by himself.  He is constantly feels exhausted and then gets frustrated when she cannot do her usual activities without having to stop and rest.  She is due for follow-up with cardiology soon and they are wanting to repeat her echocardiogram.  Also wanted to let me know that a couple weeks ago she had some lower extremity edema.  It lasted for about 3 days she did some Epsom salt soaks and try to keep them elevated and it did improve on its own.  She has never had that happen before.  She tried to get an appointment here but we were booked.  IMPRESSIONS 1. Left ventricular ejection fraction, by estimation, is 40 to 45% . The left ventricle has mildly decreased function. The left ventricle demonstrates regional wall motion abnormalities ( see scoring diagram/ findings for description) . There is mild left ventricular hypertrophy. Left ventricular diastolic parameters are consistent with Grade I diastolic dysfunction ( impaired relaxation) . 2. Right ventricular systolic function is normal. The right ventricular size is normal. There is normal  pulmonary artery systolic pressure. The estimated right ventricular systolic pressure is 31. 1 mmHg. 3. Left atrial size was mildly dilated. 4. A small pericardial effusion is present. The pericardial effusion is circumferential. There is no evidence of cardiac tamponade. 5. The mitral valve is degenerative. Trivial mitral valve regurgitation. No evidence of mitral stenosis. 6. The aortic valve is grossly normal. There is mild thickening of the aortic valve. Aortic valve regurgitation is not visualized. No aortic stenosis is present. 7. The inferior vena cava is normal in size with greater than 50% respiratory variability, suggesting right atrial pressure of 3 mmHg.    ROS    Objective:     BP (!) 167/68   Pulse 77   Ht 5' (1.524 m)   Wt 118 lb (53.5 kg)   SpO2 98%   BMI 23.05 kg/m    Physical Exam Vitals and nursing note reviewed.  Constitutional:      Appearance: She is well-developed.  HENT:     Head: Normocephalic and atraumatic.  Cardiovascular:     Rate and Rhythm: Normal rate and regular rhythm.     Heart sounds: Normal heart sounds.  Pulmonary:     Effort: Pulmonary effort is normal.     Breath sounds: Normal breath sounds.  Skin:    General: Skin is warm and dry.  Neurological:     Mental Status: She is alert and oriented to person, place, and time.  Psychiatric:        Behavior: Behavior normal.  No results found for any visits on 02/03/23.    The ASCVD Risk score (Arnett DK, et al., 2019) failed to calculate for the following reasons:   The 2019 ASCVD risk score is only valid for ages 62 to 74   The patient has a prior MI or stroke diagnosis    Assessment & Plan:   Problem List Items Addressed This Visit       Cardiovascular and Mediastinum   Essential hypertension - Primary    Ischial blood pressure was high here today but normally is well-controlled.      Relevant Orders   Lipid Panel w/reflex Direct LDL   COMPLETE METABOLIC PANEL WITH  GFR   CBC   TSH   B Nat Peptide   Urine Microalbumin w/creat. ratio     Endocrine   Impaired fasting glucose     Due to recheck A1c.  We can get this updated at next office visit if needed. Lab Results  Component Value Date   HGBA1C 6.0 (H) 04/03/2021         Relevant Orders   Lipid Panel w/reflex Direct LDL   COMPLETE METABOLIC PANEL WITH GFR   CBC   TSH   B Nat Peptide   Urine Microalbumin w/creat. ratio   Hypothyroidism    Due to recheck TSH.  Having some pretty severe fatigue.      Relevant Orders   Lipid Panel w/reflex Direct LDL   COMPLETE METABOLIC PANEL WITH GFR   CBC   TSH   B Nat Peptide   Urine Microalbumin w/creat. ratio   Other Visit Diagnoses     Localized swelling of both lower extremities       Relevant Orders   Lipid Panel w/reflex Direct LDL   COMPLETE METABOLIC PANEL WITH GFR   CBC   TSH   B Nat Peptide   Urine Microalbumin w/creat. ratio   Other fatigue       Relevant Orders   Lipid Panel w/reflex Direct LDL   COMPLETE METABOLIC PANEL WITH GFR   CBC   TSH   B Nat Peptide   Urine Microalbumin w/creat. ratio       Severe fatigue-unclear etiology will get some up-to-date labs just to make sure that her thyroid levels are well-controlled etc.  I did actually like to have her try to cut her atorvastatin in half as well just to see if she notices a difference over the next couple of weeks if she feels like she has more energy and get up and go.  Lower Extremity edema-unclear etiology she has never had it before and it did resolve on its own after about 3 days.  Will check for anemia, thyroid dysfunction and will check BNP since she does have a slightly lower EF.  No follow-ups on file.   I spent 40 minutes on the day of the encounter to include pre-visit record review, face-to-face time with the patient and post visit ordering of test.   Nani Gasser, MD

## 2023-02-03 NOTE — Assessment & Plan Note (Signed)
Ischial blood pressure was high here today but normally is well-controlled.

## 2023-02-03 NOTE — Assessment & Plan Note (Signed)
  Due to recheck A1c.  We can get this updated at next office visit if needed. Lab Results  Component Value Date   HGBA1C 6.0 (H) 04/03/2021

## 2023-02-04 NOTE — Progress Notes (Signed)
Kathleen [name], TSH is a little too low that actually means that we need to decrease your medication.  Believe you are taking a whole tab once a day.  But please verify that before I make a change.  Cholesterol and metabolic panel look great.  Your blood count is normal no sign of anemia or infection.  No shift in electrolytes that could be causing your low energy or fatigue.  No excess protein loss in the urine which is great.  I do want you to continue to work on cutting that cholesterol pill in half I just want to see if you notice a difference.  And we have 1 more blood test still pending.

## 2023-02-04 NOTE — Progress Notes (Signed)
Yes, if she could take a half a tab 2 days a week that would be wonderful.  The other option is she could skip 1 day.  So take a whole tab Monday through Saturday and then skip Sunday completely and then start that pattern over.  If that is easier than taking a half a tab 2 days a week we can do it that way as well.  And then plan to recheck level in 6 to 8 weeks.

## 2023-02-06 LAB — CBC
HCT: 39.2 % (ref 35.0–45.0)
Hemoglobin: 13.1 g/dL (ref 11.7–15.5)
MCH: 30.6 pg (ref 27.0–33.0)
MCHC: 33.4 g/dL (ref 32.0–36.0)
MCV: 91.6 fL (ref 80.0–100.0)
MPV: 11.8 fL (ref 7.5–12.5)
Platelets: 183 10*3/uL (ref 140–400)
RBC: 4.28 10*6/uL (ref 3.80–5.10)
RDW: 12 % (ref 11.0–15.0)
WBC: 7.5 10*3/uL (ref 3.8–10.8)

## 2023-02-06 LAB — COMPLETE METABOLIC PANEL WITH GFR
AG Ratio: 1.8 (calc) (ref 1.0–2.5)
ALT: 14 U/L (ref 6–29)
AST: 21 U/L (ref 10–35)
Albumin: 4.1 g/dL (ref 3.6–5.1)
Alkaline phosphatase (APISO): 85 U/L (ref 37–153)
BUN: 19 mg/dL (ref 7–25)
CO2: 30 mmol/L (ref 20–32)
Calcium: 9.8 mg/dL (ref 8.6–10.4)
Chloride: 105 mmol/L (ref 98–110)
Creat: 0.72 mg/dL (ref 0.60–0.95)
Globulin: 2.3 g/dL (calc) (ref 1.9–3.7)
Glucose, Bld: 88 mg/dL (ref 65–99)
Potassium: 3.8 mmol/L (ref 3.5–5.3)
Sodium: 143 mmol/L (ref 135–146)
Total Bilirubin: 0.6 mg/dL (ref 0.2–1.2)
Total Protein: 6.4 g/dL (ref 6.1–8.1)
eGFR: 83 mL/min/{1.73_m2} (ref >=60)

## 2023-02-06 LAB — MICROALBUMIN / CREATININE URINE RATIO
Creatinine, Urine: 52 mg/dL (ref 20–275)
Microalb Creat Ratio: 10 mg/g creat (ref <30)
Microalb, Ur: 0.5 mg/dL

## 2023-02-06 LAB — LIPID PANEL W/REFLEX DIRECT LDL
Cholesterol: 137 mg/dL (ref <200)
HDL: 57 mg/dL (ref >=50)
LDL Cholesterol (Calc): 66 mg/dL (calc)
Non-HDL Cholesterol (Calc): 80 mg/dL (calc) (ref <130)
Total CHOL/HDL Ratio: 2.4 (calc) (ref <5.0)
Triglycerides: 68 mg/dL (ref <150)

## 2023-02-06 LAB — TSH: TSH: 0.13 mIU/L — ABNORMAL LOW (ref 0.40–4.50)

## 2023-02-06 LAB — BRAIN NATRIURETIC PEPTIDE

## 2023-03-08 ENCOUNTER — Observation Stay (HOSPITAL_BASED_OUTPATIENT_CLINIC_OR_DEPARTMENT_OTHER)
Admission: EM | Admit: 2023-03-08 | Discharge: 2023-03-09 | Disposition: A | Discharge status: Home / Self Care | Payer: Medicare HMO | Attending: Internal Medicine | Admitting: Internal Medicine

## 2023-03-08 ENCOUNTER — Emergency Department (HOSPITAL_BASED_OUTPATIENT_CLINIC_OR_DEPARTMENT_OTHER): Payer: Medicare HMO

## 2023-03-08 ENCOUNTER — Other Ambulatory Visit: Payer: Self-pay

## 2023-03-08 ENCOUNTER — Encounter (HOSPITAL_BASED_OUTPATIENT_CLINIC_OR_DEPARTMENT_OTHER): Payer: Self-pay | Admitting: Emergency Medicine

## 2023-03-08 DIAGNOSIS — E039 Hypothyroidism, unspecified: Secondary | ICD-10-CM | POA: Insufficient documentation

## 2023-03-08 DIAGNOSIS — I10 Essential (primary) hypertension: Secondary | ICD-10-CM | POA: Diagnosis present

## 2023-03-08 DIAGNOSIS — Z8673 Personal history of transient ischemic attack (TIA), and cerebral infarction without residual deficits: Secondary | ICD-10-CM | POA: Insufficient documentation

## 2023-03-08 DIAGNOSIS — Q272 Other congenital malformations of renal artery: Secondary | ICD-10-CM | POA: Diagnosis not present

## 2023-03-08 DIAGNOSIS — I5022 Chronic systolic (congestive) heart failure: Secondary | ICD-10-CM | POA: Insufficient documentation

## 2023-03-08 DIAGNOSIS — R0789 Other chest pain: Secondary | ICD-10-CM

## 2023-03-08 DIAGNOSIS — I110 Hypertensive heart disease with heart failure: Secondary | ICD-10-CM | POA: Diagnosis not present

## 2023-03-08 DIAGNOSIS — Z79899 Other long term (current) drug therapy: Secondary | ICD-10-CM | POA: Insufficient documentation

## 2023-03-08 DIAGNOSIS — I25110 Atherosclerotic heart disease of native coronary artery with unstable angina pectoris: Secondary | ICD-10-CM | POA: Diagnosis not present

## 2023-03-08 DIAGNOSIS — I517 Cardiomegaly: Secondary | ICD-10-CM | POA: Diagnosis not present

## 2023-03-08 DIAGNOSIS — R9431 Abnormal electrocardiogram [ECG] [EKG]: Secondary | ICD-10-CM | POA: Diagnosis not present

## 2023-03-08 DIAGNOSIS — Z7982 Long term (current) use of aspirin: Secondary | ICD-10-CM | POA: Insufficient documentation

## 2023-03-08 DIAGNOSIS — Z955 Presence of coronary angioplasty implant and graft: Secondary | ICD-10-CM | POA: Insufficient documentation

## 2023-03-08 DIAGNOSIS — Z853 Personal history of malignant neoplasm of breast: Secondary | ICD-10-CM | POA: Insufficient documentation

## 2023-03-08 DIAGNOSIS — I200 Unstable angina: Secondary | ICD-10-CM | POA: Diagnosis not present

## 2023-03-08 DIAGNOSIS — M542 Cervicalgia: Secondary | ICD-10-CM | POA: Diagnosis not present

## 2023-03-08 DIAGNOSIS — R109 Unspecified abdominal pain: Secondary | ICD-10-CM | POA: Diagnosis not present

## 2023-03-08 DIAGNOSIS — R079 Chest pain, unspecified: Secondary | ICD-10-CM | POA: Diagnosis present

## 2023-03-08 DIAGNOSIS — J984 Other disorders of lung: Secondary | ICD-10-CM

## 2023-03-08 DIAGNOSIS — I5020 Unspecified systolic (congestive) heart failure: Secondary | ICD-10-CM | POA: Diagnosis not present

## 2023-03-08 DIAGNOSIS — K5730 Diverticulosis of large intestine without perforation or abscess without bleeding: Secondary | ICD-10-CM | POA: Diagnosis not present

## 2023-03-08 LAB — LIPASE, BLOOD: Lipase: 52 U/L — ABNORMAL HIGH (ref 11–51)

## 2023-03-08 LAB — COMPREHENSIVE METABOLIC PANEL
ALT: 16 U/L (ref 0–44)
AST: 28 U/L (ref 15–41)
Albumin: 4 g/dL (ref 3.5–5.0)
Alkaline Phosphatase: 80 U/L (ref 38–126)
Anion gap: 10 (ref 5–15)
BUN: 30 mg/dL — ABNORMAL HIGH (ref 8–23)
CO2: 24 mmol/L (ref 22–32)
Calcium: 9.2 mg/dL (ref 8.9–10.3)
Chloride: 105 mmol/L (ref 98–111)
Creatinine, Ser: 0.72 mg/dL (ref 0.44–1.00)
GFR, Estimated: >60 mL/min (ref >60)
Glucose, Bld: 109 mg/dL — ABNORMAL HIGH (ref 70–99)
Potassium: 3.2 mmol/L — ABNORMAL LOW (ref 3.5–5.1)
Sodium: 139 mmol/L (ref 135–145)
Total Bilirubin: 0.4 mg/dL (ref 0.3–1.2)
Total Protein: 6.9 g/dL (ref 6.5–8.1)

## 2023-03-08 LAB — HEPARIN LEVEL (UNFRACTIONATED)
Heparin Unfractionated: 0.68 [IU]/mL (ref 0.30–0.70)
Heparin Unfractionated: 0.42 [IU]/mL (ref 0.30–0.70)

## 2023-03-08 LAB — CBC WITH DIFFERENTIAL/PLATELET
Abs Immature Granulocytes: 0.02 10*3/uL (ref 0.00–0.07)
Basophils Absolute: 0.1 10*3/uL (ref 0.0–0.1)
Basophils Relative: 1 %
Eosinophils Absolute: 0.1 10*3/uL (ref 0.0–0.5)
Eosinophils Relative: 2 %
HCT: 40.5 % (ref 36.0–46.0)
Hemoglobin: 13.5 g/dL (ref 12.0–15.0)
Immature Granulocytes: 0 %
Lymphocytes Relative: 43 %
Lymphs Abs: 3.2 10*3/uL (ref 0.7–4.0)
MCH: 30.6 pg (ref 26.0–34.0)
MCHC: 33.3 g/dL (ref 30.0–36.0)
MCV: 91.8 fL (ref 80.0–100.0)
Monocytes Absolute: 0.7 10*3/uL (ref 0.1–1.0)
Monocytes Relative: 10 %
Neutro Abs: 3.2 10*3/uL (ref 1.7–7.7)
Neutrophils Relative %: 44 %
Platelets: 191 10*3/uL (ref 150–400)
RBC: 4.41 MIL/uL (ref 3.87–5.11)
RDW: 12.8 % (ref 11.5–15.5)
WBC: 7.3 10*3/uL (ref 4.0–10.5)
nRBC: 0 % (ref 0.0–0.2)

## 2023-03-08 LAB — URINALYSIS, ROUTINE W REFLEX MICROSCOPIC
Bilirubin Urine: NEGATIVE
Glucose, UA: NEGATIVE mg/dL
Ketones, ur: NEGATIVE mg/dL
Nitrite: NEGATIVE
Protein, ur: NEGATIVE mg/dL
Specific Gravity, Urine: 1.015 (ref 1.005–1.030)
pH: 7 (ref 5.0–8.0)

## 2023-03-08 LAB — TROPONIN I (HIGH SENSITIVITY)
Troponin I (High Sensitivity): 13 ng/L (ref <18)
Troponin I (High Sensitivity): 14 ng/L (ref <18)
Troponin I (High Sensitivity): 16 ng/L (ref <18)
Troponin I (High Sensitivity): 17 ng/L (ref <18)

## 2023-03-08 LAB — URINALYSIS, MICROSCOPIC (REFLEX): Bacteria, UA: NONE SEEN

## 2023-03-08 MED ORDER — ASPIRIN 81 MG PO CHEW
324.0000 mg | CHEWABLE_TABLET | Freq: Once | ORAL | Status: AC
  Administered 2023-03-08: 324 mg via ORAL
  Filled 2023-03-08: qty 4

## 2023-03-08 MED ORDER — ACETAMINOPHEN 325 MG PO TABS: 650.0000 mg | ORAL_TABLET | ORAL | Status: DC | PRN

## 2023-03-08 MED ORDER — HEPARIN (PORCINE) 25000 UT/250ML-% IV SOLN
600.0000 [IU]/h | INTRAVENOUS | Status: DC
  Administered 2023-03-08: 600 [IU]/h via INTRAVENOUS
  Filled 2023-03-08: qty 250

## 2023-03-08 MED ORDER — ASPIRIN 81 MG PO TBEC
81.0000 mg | DELAYED_RELEASE_TABLET | Freq: Every day | ORAL | Status: DC
  Administered 2023-03-09: 81 mg via ORAL
  Filled 2023-03-08: qty 1

## 2023-03-08 MED ORDER — ONDANSETRON HCL 4 MG/2ML IJ SOLN: 4.0000 mg | Freq: Four times a day (QID) | INTRAMUSCULAR | Status: DC | PRN

## 2023-03-08 MED ORDER — ATORVASTATIN CALCIUM 80 MG PO TABS
80.0000 mg | ORAL_TABLET | Freq: Every day | ORAL | Status: DC
  Administered 2023-03-08 – 2023-03-09 (×2): 80 mg via ORAL
  Filled 2023-03-08 (×2): qty 1

## 2023-03-08 MED ORDER — POTASSIUM CHLORIDE CRYS ER 20 MEQ PO TBCR
40.0000 meq | EXTENDED_RELEASE_TABLET | Freq: Once | ORAL | Status: AC
  Administered 2023-03-08: 40 meq via ORAL
  Filled 2023-03-08: qty 2

## 2023-03-08 MED ORDER — HEPARIN BOLUS VIA INFUSION
2900.0000 [IU] | Freq: Once | INTRAVENOUS | Status: AC
  Administered 2023-03-08: 2900 [IU] via INTRAVENOUS

## 2023-03-08 MED ORDER — IOHEXOL 350 MG/ML SOLN
75.0000 mL | Freq: Once | INTRAVENOUS | Status: AC | PRN
  Administered 2023-03-08: 75 mL via INTRAVENOUS

## 2023-03-08 MED ORDER — NITROGLYCERIN 0.4 MG SL SUBL: 0.4000 mg | SUBLINGUAL_TABLET | SUBLINGUAL | Status: DC | PRN

## 2023-03-08 MED ORDER — LEVOTHYROXINE SODIUM 75 MCG PO TABS
75.0000 ug | ORAL_TABLET | Freq: Every day | ORAL | Status: DC
  Administered 2023-03-09: 75 ug via ORAL

## 2023-03-08 MED ORDER — HYDRALAZINE HCL 25 MG PO TABS
25.0000 mg | ORAL_TABLET | Freq: Once | ORAL | Status: DC
  Filled 2023-03-08: qty 1

## 2023-03-08 NOTE — Progress Notes (Signed)
ANTICOAGULATION CONSULT NOTE - Initial Consult  Pharmacy Consult for heparin Indication: chest pain/ACS  Allergies  Allergen Reactions   Streptomycin Other (See Comments)    Passed out   Amlodipine Other (See Comments)    Dizziness on 5mg     Codeine Other (See Comments)    Unknown   Elemental Sulfur     Other reaction(s): Other (See Comments) Pt not sure   Penicillins Other (See Comments)    Pass out   Sulfa Antibiotics Other (See Comments)    Unknown   Sulfonamide Derivatives Other (See Comments)    Unknown   Sulfur     Other Reaction(s): Other (See Comments)  Pt not sure   Benicar [Olmesartan] Other (See Comments)    Diizzy   Hctz [Hydrochlorothiazide] Other (See Comments)    Vertigo, dizziness.    Lisinopril Other (See Comments)    lightheaded    Patient Measurements: Height: 5' (152.4 cm) Weight: 49.9 kg (110 lb) IBW/kg (Calculated) : 45.5 Heparin Dosing Weight: 49.9 kg   Vital Signs: Temp: 98.2 F (36.8 C) (08/18 0223) Temp Source: Oral (08/18 0223) BP: 163/80 (08/18 0245) Pulse Rate: 75 (08/18 0245)  Labs: Recent Labs    03/08/23 0227  HGB 13.5  HCT 40.5  PLT 191  CREATININE 0.72  TROPONINIHS 13    Estimated Creatinine Clearance: 38.3 mL/min (by C-G formula based on SCr of 0.72 mg/dL).   Medical History: Past Medical History:  Diagnosis Date   Cancer (HCC)    Hx RT breast infiltrated ductal CA previously on tamoxifen and armidex    Herpes simplex    lt eye   Hypertension    Kidney stones    history   MVP (mitral valve prolapse)    asymptomatic   Personal history of chemotherapy    Personal history of radiation therapy     Medications:  Scheduled:   Assessment: 52 yof presenting with chest pain - no AC PTA.   Hgb 13.5, plt 191. Trop 13. No s/sx of bleeding noted.   Goal of Therapy:  Heparin level 0.3-0.7 units/ml Monitor platelets by anticoagulation protocol: Yes   Plan:  Give 2900 units bolus x 1 Start heparin infusion  at 600 units/hr Check anti-Xa level in 8 hours and daily while on heparin Continue to monitor H&H and platelets   Thank you for allowing pharmacy to participate in this patient's care,  Sherron Monday, PharmD, BCCCP Clinical Pharmacist  Phone: [phone] 03/08/2023 4:44 AM  Please check AMION for all [facility] Pharmacy phone numbers After 10:00 PM, call Main Pharmacy [phone]

## 2023-03-08 NOTE — ED Notes (Signed)
Report rec'd from prev RN 

## 2023-03-08 NOTE — Progress Notes (Signed)
ANTICOAGULATION CONSULT NOTE   Pharmacy Consult for heparin Indication: chest pain/ACS  Allergies  Allergen Reactions   Streptomycin Other (See Comments)    Passed out   Amlodipine Other (See Comments)    Dizziness on 5mg     Codeine Other (See Comments)    Unknown   Elemental Sulfur     Other reaction(s): Other (See Comments) Pt not sure   Penicillins Other (See Comments)    Pass out   Sulfa Antibiotics Other (See Comments)    Unknown   Sulfonamide Derivatives Other (See Comments)    Unknown   Sulfur     Other Reaction(s): Other (See Comments)  Pt not sure   Benicar [Olmesartan] Other (See Comments)    Diizzy   Hctz [Hydrochlorothiazide] Other (See Comments)    Vertigo, dizziness.    Lisinopril Other (See Comments)    lightheaded    Patient Measurements: Height: 5' (152.4 cm) Weight: 49.9 kg (110 lb) IBW/kg (Calculated) : 45.5 Heparin Dosing Weight: 49.9 kg   Vital Signs: Temp: 97.6 F (36.4 C) (08/18 1410) Temp Source: Oral (08/18 1410) BP: 184/79 (08/18 1410) Pulse Rate: 65 (08/18 1410)  Labs: Recent Labs    03/08/23 0227 03/08/23 0416 03/08/23 0628 03/08/23 0809 03/08/23 1409  HGB 13.5  --   --   --   --   HCT 40.5  --   --   --   --   PLT 191  --   --   --   --   HEPARINUNFRC  --   --   --   --  0.68  CREATININE 0.72  --   --   --   --   TROPONINIHS 13 14 16 17   --     Estimated Creatinine Clearance: 38.3 mL/min (by C-G formula based on SCr of 0.72 mg/dL).   Medical History: Past Medical History:  Diagnosis Date   Cancer (HCC)    Hx RT breast infiltrated ductal CA previously on tamoxifen and armidex    Herpes simplex    lt eye   Hypertension    Kidney stones    history   MVP (mitral valve prolapse)    asymptomatic   Personal history of chemotherapy    Personal history of radiation therapy     Assessment: Kathleen [name] is a 82 y.o. year old female admitted on 03/08/2023 with concern for unstable angina. No anticoagulation prior to  admission. Pharmacy consulted to dose heparin.  0.68 heparin level, therapeutic  No issues with infusion or overt s/sx bleeding.   Goal of Therapy:  Heparin level 0.3-0.7 units/ml Monitor platelets by anticoagulation protocol: Yes   Plan:  Continue heparin 600 units/hr  8H heparin level Daily heparin level and CBC  Thank you for allowing pharmacy to participate in this patient's care.  Marja Kays, PharmD Emergency Medicine Clinical Pharmacist 03/08/2023,4:00 PM

## 2023-03-08 NOTE — Progress Notes (Signed)
ANTICOAGULATION CONSULT NOTE   Pharmacy Consult for heparin Indication: chest pain/ACS  Allergies  Allergen Reactions   Streptomycin Other (See Comments)    Passed out   Amlodipine Other (See Comments)    Dizziness on 5mg     Codeine Other (See Comments)    Unknown   Elemental Sulfur     Other reaction(s): Other (See Comments) Pt not sure   Penicillins Other (See Comments)    Pass out   Sulfa Antibiotics Other (See Comments)    Unknown   Sulfonamide Derivatives Other (See Comments)    Unknown   Sulfur     Other Reaction(s): Other (See Comments)  Pt not sure   Benicar [Olmesartan] Other (See Comments)    Diizzy   Hctz [Hydrochlorothiazide] Other (See Comments)    Vertigo, dizziness.    Lisinopril Other (See Comments)    lightheaded    Patient Measurements: Height: 5' (152.4 cm) Weight: 53.3 kg (117 lb 8.1 oz) IBW/kg (Calculated) : 45.5 Heparin Dosing Weight: 49.9 kg   Vital Signs: Temp: 97.8 F (36.6 C) (08/18 2027) Temp Source: Oral (08/18 2027) BP: 129/57 (08/18 2027) Pulse Rate: 67 (08/18 2027)  Labs: Recent Labs    03/08/23 0227 03/08/23 0416 03/08/23 0628 03/08/23 0809 03/08/23 1409 03/08/23 2010  HGB 13.5  --   --   --   --   --   HCT 40.5  --   --   --   --   --   PLT 191  --   --   --   --   --   HEPARINUNFRC  --   --   --   --  0.68 0.42  CREATININE 0.72  --   --   --   --   --   TROPONINIHS 13 14 16 17   --   --     Estimated Creatinine Clearance: 38.3 mL/min (by C-G formula based on SCr of 0.72 mg/dL).   Medical History: Past Medical History:  Diagnosis Date   Cancer (HCC)    Hx RT breast infiltrated ductal CA previously on tamoxifen and armidex    Complete tear of left rotator cuff 01/01/2022   Gross hematuria 12/04/2021   Herpes simplex    lt eye   HIP PAIN, RIGHT 01/05/2008   Qualifier: Diagnosis of   By: Cathey Endow DO, Karen         Hypertension    Impingement syndrome of left shoulder 12/02/2021   Internal derangement of left  knee 10/09/2016   Kidney stones    history   LEG PAIN, BILATERAL 01/20/2008   Qualifier: Diagnosis of   By: Linford Arnold MD, Catherine         MVP (mitral valve prolapse)    asymptomatic   Personal history of chemotherapy    Personal history of radiation therapy    Radial styloid fracture 09/17/2021    Assessment: Kathleen [name] is a 82 y.o. year old female admitted on 03/08/2023 with concern for unstable angina. No anticoagulation prior to admission. Pharmacy consulted to dose heparin.  Heparin level came back therapeutic again. Cont current dose.  Goal of Therapy:  Heparin level 0.3-0.7 units/ml Monitor platelets by anticoagulation protocol: Yes   Plan:  Continue heparin 600 units/hr  Daily heparin level and CBC  Ulyses Southward, PharmD, BCIDP, AAHIVP, CPP Infectious Disease Pharmacist 03/08/2023 9:05 PM

## 2023-03-08 NOTE — Progress Notes (Addendum)
Plan of Care Note for accepted transfer  Patient: Kathleen [name]              ZOX:[id number]  DOA: 03/08/2023     Facility requesting transfer: [facility] emergency department Requesting Provider: Glynn Octave, MD  Reason for transfer: Workup for unstable angina.  Facility course:  82 year old female medical history of obstructive CAD s/p PCI with LAD Synergy DES stent placement 02/10/22, chronic small pericardial effusion, essential hypertension and hyperlipidemia presented to emergency department at [facility] with complaining of chest pain, neck pain and back pain for which she woke up from the sleep and came to ED for evaluation.  Patient reported she has diffuse chest and abdominal discomfort.  She has associated diaphoresis.  Denies any SOB, nausea, vomiting.  At ED initial presentation heart rate 87, respiratory 16, elevated blood pressure 156/96 O2 sat 100% room air. CBC grossly unremarkable CMP unremarkable except slightly low potassium 3.2 and elevated BUN 30. Troponin level 13.  Pending second Trope level.  EKG showed subtle ST changes at V3 lead without any reciprocal change and mature ventricular complex.  Troponin is negative. ED physician Dr. Manus Gunning spoke with on-call cardiology Dr. Brayton Layman recommended to start heparin drip, give aspirin 325 mg and transfer patient to [facility] for further evaluation.  Need to reach out to cardiology upon arrival to [facility].  Chest x-ray no active cardiopulmonary disease.  CTA chest ruled out PE.  Remarkable for moderate multivessel coronary artery calcification, stable small pericardial effusion, small hiatal hernia and moderate sigmoid diverticulosis  In the ED heparin drip has been started by pharmacy protocol.  Patient received aspirin 324 mg once.  Hospitalist has been consulted for admission for further workup and management.   Plan of care: Need to reach out cardiology upon arrival to [facility].  The patient is  accepted for admission to Cardiac- Telemetry unit, at [facility].    Check [email or web address] for on-call coverage.  [facility] will assume care on arrival to accepting facility. Until arrival, medical decision making responsibilities remain with the EDP.  However, [facility] available 24/7 for questions and assistance.   Nursing staff please page [facility] Admits and Consults [phone]) as soon as the patient arrives to the hospital.    Author: Tereasa Coop, MD  03/08/2023  [facility]

## 2023-03-08 NOTE — ED Provider Notes (Signed)
[place] [facility] Provider Note   CSN: [id number] Arrival date & time: 03/08/23  0210     History  Chief Complaint  Patient presents with   Chest Pain   Back Pain    Kathleen [name] is a 82 y.o. female.  Patient with a history of CAD with stent, hypertension, mitral valve prolapse here with chest pain, neck pain and back pain that woke her from sleep about 45 minutes ago.  States she felt well going to bed and then woke up with pain to her neck, chest, abdomen diffuse in her back.  This is associated with diaphoresis.  She describes the pain radiating "everywhere" involving her neck, back and abdomen.  Associate with diaphoresis.  No shortness of breath, nausea, vomiting, cough or fever.  Never had this pain in the past.  Does have 1 stent.  States she did not have chest pain at the time of the stent placement. Her pain has resolved now and she is currently chest pain-free.  Feels back to baseline.  Did not take any thing for it at home.  The history is provided by the patient and the spouse.  Chest Pain Associated symptoms: back pain   Associated symptoms: no abdominal pain, no dizziness, no fever, no headache, no nausea, no shortness of breath, no vomiting and no weakness   Back Pain Associated symptoms: chest pain   Associated symptoms: no abdominal pain, no dysuria, no fever, no headaches and no weakness        Home Medications Prior to Admission medications   Medication Sig Start Date End Date Taking? Authorizing Provider  aspirin EC 81 MG tablet Take 81 mg by mouth daily.    [provider]  atorvastatin (LIPITOR) 80 MG tablet TAKE ONE (1) TABLET BY MOUTH EVERY DAY 03/03/22   Agapito Games, MD  Cholecalciferol (VITAMIN D) 125 MCG (5000 UT) CAPS Take 5,000 Units by mouth daily.    [provider]  Ferrous Sulfate (IRON PO) Take 65 mg by mouth daily.    [provider]  levothyroxine (SYNTHROID) 75 MCG  tablet TAKE ONE (1) TABLET BY MOUTH EACH DAY 11/10/22   Agapito Games, MD  Multiple Vitamins-Minerals (MULTIVITAMIN WITH MINERALS) tablet Take 6 tablets by mouth daily. Balance of nature    [provider]  TURMERIC PO Take 1 capsule by mouth daily.    [provider]  valACYclovir (VALTREX) 500 MG tablet TAKE ONE (1) TABLET BY MOUTH EVERY DAY 11/10/22   Agapito Games, MD  Zinc Sulfate (ZINC 15 PO) Take 1 tablet by mouth daily.    [provider]      Allergies    Streptomycin, Amlodipine, Codeine, Elemental sulfur, Penicillins, Sulfa antibiotics, Sulfonamide derivatives, Sulfur, Benicar [olmesartan], Hctz [hydrochlorothiazide], and Lisinopril    Review of Systems   Review of Systems  Constitutional:  Negative for activity change, appetite change and fever.  HENT:  Negative for congestion and rhinorrhea.   Respiratory:  Positive for chest tightness. Negative for shortness of breath.   Cardiovascular:  Positive for chest pain.  Gastrointestinal:  Positive for abdominal distention. Negative for abdominal pain, nausea and vomiting.  Genitourinary:  Negative for dysuria and hematuria.  Musculoskeletal:  Positive for back pain.  Skin:  Negative for rash.  Neurological:  Negative for dizziness, weakness and headaches.   all other systems are negative except as noted in the HPI and PMH.    Physical Exam Updated Vital  Signs BP (!) 156/96   Pulse 87   Temp 98.2 F (36.8 C) (Oral)   Resp 16   Ht 5' (1.524 m)   Wt 49.9 kg   SpO2 100%   BMI 21.48 kg/m  Physical Exam Vitals and nursing note reviewed.  Constitutional:      General: She is not in acute distress.    Appearance: She is well-developed.     Comments: uncomfortable  HENT:     Head: Normocephalic and atraumatic.     Mouth/Throat:     Pharynx: No oropharyngeal exudate.  Eyes:     Conjunctiva/sclera: Conjunctivae normal.     Pupils: Pupils are equal, round, and reactive to light.   Neck:     Comments: No meningismus. Cardiovascular:     Rate and Rhythm: Normal rate and regular rhythm.     Heart sounds: Normal heart sounds. No murmur heard.    Comments: Equal radial pulses and grip strengths.  Pulmonary:     Effort: Pulmonary effort is normal. No respiratory distress.     Breath sounds: Normal breath sounds.  Chest:     Chest wall: No tenderness.  Abdominal:     Palpations: Abdomen is soft.     Tenderness: There is no abdominal tenderness. There is no guarding or rebound.  Musculoskeletal:        General: No tenderness. Normal range of motion.     Cervical back: Normal range of motion and neck supple.  Skin:    General: Skin is warm.  Neurological:     Mental Status: She is alert and oriented to person, place, and time.     Cranial Nerves: No cranial nerve deficit.     Motor: No abnormal muscle tone.     Coordination: Coordination normal.     Comments:  5/5 strength throughout. CN 2-12 intact.Equal grip strength.   Psychiatric:        Behavior: Behavior normal.     ED Results / Procedures / Treatments   Labs (all labs ordered are listed, but only abnormal results are displayed) Labs Reviewed  COMPREHENSIVE METABOLIC PANEL - Abnormal; Notable for the following components:      Result Value   Potassium 3.2 (*)    Glucose, Bld 109 (*)    BUN 30 (*)    All other components within normal limits  LIPASE, BLOOD - Abnormal; Notable for the following components:   Lipase 52 (*)    All other components within normal limits  URINALYSIS, ROUTINE W REFLEX MICROSCOPIC - Abnormal; Notable for the following components:   Hgb urine dipstick SMALL (*)    Leukocytes,Ua TRACE (*)    All other components within normal limits  CBC WITH DIFFERENTIAL/PLATELET  URINALYSIS, MICROSCOPIC (REFLEX)  HEPARIN LEVEL (UNFRACTIONATED)  TROPONIN I (HIGH SENSITIVITY)  TROPONIN I (HIGH SENSITIVITY)    EKG EKG Interpretation Date/Time:  Sunday March 08 2023 02:30:14  EDT Ventricular Rate:  86 PR Interval:  179 QRS Duration:  97 QT Interval:  351 QTC Calculation: 420 R Axis:   24  Text Interpretation: Sinus rhythm Multiple ventricular premature complexes Anterior infarct, old No significant change was found Confirmed by Glynn Octave [id number]) on 03/08/2023 2:33:08 AM  Radiology CT Angio Chest/Abd/Pel for Dissection W and/or Wo Contrast  Result Date: 03/08/2023 CLINICAL DATA:  Acute aortic syndrome, chest pain, neck pain, back pain. Diaphoresis, dyspnea, bilateral lower extremity numbness/paresthesia. EXAM: CT ANGIOGRAPHY CHEST, ABDOMEN AND PELVIS TECHNIQUE: Non-contrast CT of the chest was initially obtained. Multidetector  CT imaging through the chest, abdomen and pelvis was performed using the standard protocol during bolus administration of intravenous contrast. Multiplanar reconstructed images and MIPs were obtained and reviewed to evaluate the vascular anatomy. RADIATION DOSE REDUCTION: This exam was performed according to the departmental dose-optimization program which includes automated exposure control, adjustment of the mA and/or kV according to patient size and/or use of iterative reconstruction technique. CONTRAST:  75mL OMNIPAQUE IOHEXOL 350 MG/ML SOLN COMPARISON:  CT chest 01/28/2022, CT abdomen pelvis 03/06/2022 FINDINGS: CTA CHEST FINDINGS Cardiovascular: The thoracic aorta is normal in course and caliber. No intramural hematoma, dissection, or aneurysm. Arch vasculature demonstrates normal anatomic configuration and is widely patent proximally. Mild atherosclerotic calcification within the descending thoracic aorta. Moderate multi-vessel coronary artery calcification. Cardiac size within normal limits. Small pericardial effusion is stable since prior examination. Central pulmonary arteries are of normal caliber. Mediastinum/Nodes: Visualized thyroid is unremarkable. No pathologic thoracic adenopathy. Esophagus unremarkable. Small hiatal hernia.  Lungs/Pleura: Lungs are clear. No pleural effusion or pneumothorax. Musculoskeletal: Partial right breast resection axillary lymph node dissection has been performed. No acute bone abnormality. No lytic or blastic bone lesion. Review of the MIP images confirms the above findings. CTA ABDOMEN AND PELVIS FINDINGS VASCULAR Aorta: Normal caliber aorta without aneurysm, dissection, vasculitis or significant stenosis. Mild atherosclerotic calcification. Celiac: Patent without evidence of aneurysm, dissection, vasculitis or significant stenosis. SMA: Patent without evidence of aneurysm, dissection, vasculitis or significant stenosis. Renals: Main renal arteries bilaterally are widely patent and demonstrate normal vascular morphology. Small bilateral accessory renal arteries are noted involving the lower poles. IMA: Patent without evidence of aneurysm, dissection, vasculitis or significant stenosis. Inflow: Patent without evidence of aneurysm, dissection, vasculitis or significant stenosis. Veins: No obvious venous abnormality within the limitations of this arterial phase study. Review of the MIP images confirms the above findings. NON-VASCULAR Hepatobiliary: The liver is unremarkable. The gallbladder is distended, however, no superimposed pericholecystic changes identified. This appears similar to prior examination. Mild intra and extrahepatic biliary ductal dilation is stable since remote prior examination of 12/09/2021, likely representing senescent delay shin. Pancreas: Unremarkable. No pancreatic ductal dilatation or surrounding inflammatory changes. Spleen: Normal in size without focal abnormality. Adrenals/Urinary Tract: The adrenal glands are unremarkable. The kidneys are normal in size and echogenicity. Multiple parapelvic cysts are seen within the kidneys bilaterally for which no follow-up imaging is recommended. The kidneys are otherwise unremarkable. Bladder unremarkable. Stomach/Bowel: Moderate sigmoid  diverticulosis without superimposed acute inflammatory change. The stomach, small bowel, and large bowel are otherwise unremarkable. The appendix is not clearly identified and is likely absent. No free intraperitoneal gas or fluid. Lymphatic: No pathologic adenopathy within the abdomen and pelvis. Reproductive: Status post hysterectomy. No adnexal masses. Other: No abdominal wall hernia Musculoskeletal: No acute bone abnormality. No lytic or blastic bone lesion. Moderate thoracolumbar sigmoid scoliosis noted. Review of the MIP images confirms the above findings. IMPRESSION: 1. No acute intrathoracic or intra-abdominal pathology identified. No etiology identified for the patient's reported symptoms. 2. Moderate multi-vessel coronary artery calcification. 3. Stable small pericardial effusion. 4. Small hiatal hernia. 5. Moderate sigmoid diverticulosis without superimposed acute inflammatory change. Aortic Atherosclerosis (ICD10-I70.0). Electronically Signed   By: Helyn Numbers M.D.   On: 03/08/2023 04:15   DG Chest 2 View  Result Date: 03/08/2023 CLINICAL DATA:  Chest pain radiating to neck and back. EXAM: CHEST - 2 VIEW COMPARISON:  03/06/2022. FINDINGS: The heart is enlarged and mediastinal contours are within normal limits. No consolidation, effusion, or pneumothorax. No acute osseous abnormality.  Surgical clips are present in the right chest. IMPRESSION: No active cardiopulmonary disease. Electronically Signed   By: Thornell Sartorius M.D.   On: 03/08/2023 03:40    Procedures .Critical Care  Performed by: Glynn Octave, MD Authorized by: Glynn Octave, MD   Critical care provider statement:    Critical care time (minutes):  45   Critical care time was exclusive of:  Separately billable procedures and treating other patients   Critical care was necessary to treat or prevent imminent or life-threatening deterioration of the following conditions: ACS, unstable angina.   Critical care was time spent  personally by me on the following activities:  Development of treatment plan with patient or surrogate, discussions with consultants, evaluation of patient's response to treatment, examination of patient, ordering and review of laboratory studies, ordering and review of radiographic studies, ordering and performing treatments and interventions, pulse oximetry, re-evaluation of patient's condition, review of old charts, blood draw for specimens and obtaining history from patient or surrogate   I assumed direction of critical care for this patient from another provider in my specialty: no     Care discussed with: admitting provider       Medications Ordered in ED Medications  aspirin chewable tablet 324 mg (has no administration in time range)    ED Course/ Medical Decision Making/ A&P                                 Medical Decision Making Amount and/or Complexity of Data Reviewed Independent Historian: spouse Labs: ordered. Decision-making details documented in ED Course. Radiology: ordered and independent interpretation performed. Decision-making details documented in ED Course. ECG/medicine tests: ordered and independent interpretation performed. Decision-making details documented in ED Course.  Risk OTC drugs. Prescription drug management. Decision regarding hospitalization.   Patient awoke from sleep with diffuse chest pain, neck pain and back pain. Had numbness in bilateral legs from knees down.  Now all symptoms resolved.  EKG shows septal Q waves with minimal ST elevation in V2 and V3 similar to previous.  No reciprocal change.  EKG reviewed with cardiology Dr. Brayton Layman.  He agrees septal Q waves are similar to previous.  Slight ST changes in V3 but does not meet STEMI criteria.  Agrees with medical admission and cardiology consult.  Patient given aspirin.  Initial troponin is reassuring at 15.  Chest x-ray negative for acute infiltrate.  Results reviewed and interpreted by  me.  Blood pressure has improved to 163/80.  Remains chest pain-free.  No nitroglycerin given.  CTA obtained given diffuse chest and abdominal pain with radiation to the back.  No evidence of aortic dissection. Stable pericardial effusion without evidence of tamponade.  No obvious pulmonary embolism.  Patient remains chest pain-free.  Second troponin is pending. Will need admission for unstable angina with EKG changes.  Cardiology to consult. Admission discussed with Dr. Janalyn Shy.         Final Clinical Impression(s) / ED Diagnoses Final diagnoses:  Unstable angina (HCC)  Abnormal EKG    Rx / DC Orders ED Discharge Orders     None         Yan Pankratz, Jeannett Senior, MD 03/08/23 [phone]

## 2023-03-08 NOTE — ED Notes (Signed)
Called [facility] for [facility] talked to [facility] at 2:38

## 2023-03-08 NOTE — Progress Notes (Addendum)
   03/08/23 1704  Vitals  Temp 98 F (36.7 C)  Temp Source Oral  BP (!) 187/63  MAP (mmHg) 95  BP Location Left Arm  BP Method Automatic  Patient Position (if appropriate) Sitting  Pulse Rate 72  Pulse Rate Source Monitor  ECG Heart Rate 72  Resp 16  Level of Consciousness  Level of Consciousness Alert  MEWS COLOR  MEWS Score Color Green  Oxygen Therapy  SpO2 100 %  O2 Device Room Air  Pain Assessment  Pain Scale 0-10  Pain Score 0  PCA/Epidural/Spinal Assessment  Respiratory Pattern Regular;Unlabored  Height and Weight  Height 5' (1.524 m)  Weight 53.3 kg  Type of Scale Used Standing  Type of Weight Actual  BSA (Calculated - sq m) 1.5 sq meters  BMI (Calculated) 22.95  Weight in (lb) to have BMI = 25 127.7  ECG Monitoring  PR interval 0.19  QRS interval 0.12  QT interval 0.41  QTc interval 0.44  CV Strip Heart Rate 100  Cardiac Rhythm NSR  Telemetry Box Number [id number]  Tele Box Verification Completed by Second Verifier Completed  Glasgow Coma Scale  Eye Opening 4  Best Verbal Response (NON-intubated) 5  Best Motor Response 6  Glasgow Coma Scale Score 15  MEWS Score  MEWS Temp 0  MEWS Systolic 0  MEWS Pulse 0  MEWS RR 0  MEWS LOC 0  MEWS Score 0   Patient arrived onto the unit from [facility] via [facility]. Patient asymptomatic from stretcher to bed. VS obtained and stable. Tele monitor applied and CCMD notified of patient's arrival. CHG bath completed. TRH MD made aware. Family at bedside. Patient's bed at lowest setting. Call bel within reach. Family assisting with dinner.

## 2023-03-08 NOTE — H&P (Signed)
History and Physical   Kathleen [name] ZOX:[id number] DOB: [birth year] DOA: 03/08/2023  PCP: Agapito Games, MD   Patient coming from: Home  Chief Complaint: Chest pain  HPI: Kathleen [name] is a 82 y.o. female with medical history significant of HTN, CAD status post stenting, hypothyroidism, CVA, obstructive lung disease, breast cancer, HFmrEF presenting with chest pain.  Patient was woken from sleep melanite last night and presented to the ED following this.  She felt well prior to going to bed.  Reports chest pain radiating to the neck, abdomen, back.  Reports associated diaphoresis.  Denies any history of similar pain and she was chest pain-free when she had her prior stent placed.   chest pain did resolve in the ED.  Denies fevers, chills, abdominal pain, shortness of breath, constipation, diarrhea, nausea, vomiting.  ED Course: Vital signs in the ED notable for blood pressure in the 120s to 160s systolic.  Lab workup included CMP with potassium 3.2, BUN 30, glucose 109.  CBC within normal limits.  Troponin negative x 4.  Lipase 52.  Urinalysis with hemoglobin and leukocytes only.  Chest x-ray showed no acute abnormality.  CT of the chest abdomen pelvis showed no acute abnormality, did demonstrate CAD, stable small pericardial effusion, diverticulosis.  Patient received aspirin and heparin infusion in the ED.  Cardiology was consulted recommended continue with heparin drip and  observed with cardiology consultation.  Review of Systems: As per HPI otherwise all other systems reviewed and are negative.  Past Medical History:  Diagnosis Date   Cancer (HCC)    Hx RT breast infiltrated ductal CA previously on tamoxifen and armidex    Complete tear of left rotator cuff 01/01/2022   Gross hematuria 12/04/2021   Herpes simplex    lt eye   HIP PAIN, RIGHT 01/05/2008   Qualifier: Diagnosis of   By: Cathey Endow DO, Karen         Hypertension    Impingement syndrome of left shoulder 12/02/2021    Internal derangement of left knee 10/09/2016   Kidney stones    history   LEG PAIN, BILATERAL 01/20/2008   Qualifier: Diagnosis of   By: Linford Arnold MD, Santina Evans         MVP (mitral valve prolapse)    asymptomatic   Personal history of chemotherapy    Personal history of radiation therapy    Radial styloid fracture 09/17/2021    Past Surgical History:  Procedure Laterality Date   ABDOMINAL HYSTERECTOMY     partial   BREAST BIOPSY     BREAST LUMPECTOMY Right 2000   RT   CERVICAL LAMINECTOMY  1986   CORONARY STENT INTERVENTION N/A 02/10/2022   Procedure: CORONARY STENT INTERVENTION;  Surgeon: Tonny Bollman, MD;  Location: [facility] INVASIVE CV LAB;  Service: Cardiovascular;  Laterality: N/A;   RIGHT/LEFT HEART CATH AND CORONARY ANGIOGRAPHY N/A 02/10/2022   Procedure: RIGHT/LEFT HEART CATH AND CORONARY ANGIOGRAPHY;  Surgeon: Tonny Bollman, MD;  Location: [facility] INVASIVE CV LAB;  Service: Cardiovascular;  Laterality: N/A;   SHOULDER SURGERY     rotator cuff repair   throidectomy  1988   For cancer     Social History  reports that she has never smoked. She has never used smokeless tobacco. She reports that she does not drink alcohol and does not use drugs.  Allergies  Allergen Reactions   Streptomycin Other (See Comments)    Passed out   Amlodipine Other (See Comments)    Dizziness on 5mg   Codeine Other (See Comments)    Unknown   Elemental Sulfur     Other reaction(s): Other (See Comments) Pt not sure   Penicillins Other (See Comments)    Pass out   Sulfa Antibiotics Other (See Comments)    Unknown   Sulfonamide Derivatives Other (See Comments)    Unknown   Sulfur     Other Reaction(s): Other (See Comments)  Pt not sure   Benicar [Olmesartan] Other (See Comments)    Diizzy   Hctz [Hydrochlorothiazide] Other (See Comments)    Vertigo, dizziness.    Lisinopril Other (See Comments)    lightheaded    Family History  Problem Relation Age of Onset   Breast cancer Mother    Reviewed on admission  Prior to Admission medications   Medication Sig Start Date End Date Taking? Authorizing Provider  aspirin EC 81 MG tablet Take 81 mg by mouth daily.    [provider]  atorvastatin (LIPITOR) 80 MG tablet TAKE ONE (1) TABLET BY MOUTH EVERY DAY 03/03/22   Agapito Games, MD  Cholecalciferol (VITAMIN D) 125 MCG (5000 UT) CAPS Take 5,000 Units by mouth daily.    [provider]  Ferrous Sulfate (IRON PO) Take 65 mg by mouth daily.    [provider]  levothyroxine (SYNTHROID) 75 MCG tablet TAKE ONE (1) TABLET BY MOUTH EACH DAY 11/10/22   Agapito Games, MD  Multiple Vitamins-Minerals (MULTIVITAMIN WITH MINERALS) tablet Take 6 tablets by mouth daily. Balance of nature    [provider]  TURMERIC PO Take 1 capsule by mouth daily.    [provider]  valACYclovir (VALTREX) 500 MG tablet TAKE ONE (1) TABLET BY MOUTH EVERY DAY 11/10/22   Agapito Games, MD  Zinc Sulfate (ZINC 15 PO) Take 1 tablet by mouth daily.    [provider]    Physical Exam: Vitals:   03/08/23 0730 03/08/23 1052 03/08/23 1410 03/08/23 1600  BP: (!) 143/71 (!) 151/68 (!) 184/79 (!) 161/73  Pulse: 64 71 65 62  Resp: 17 18 15 14   Temp:  97.8 F (36.6 C) 97.6 F (36.4 C)   TempSrc:  Oral Oral   SpO2: 99% 100% 98% 97%  Weight:      Height:        Physical Exam Constitutional:      General: She is not in acute distress.    Appearance: Normal appearance.  HENT:     Head: Normocephalic and atraumatic.     Mouth/Throat:     Mouth: Mucous membranes are moist.     Pharynx: Oropharynx is clear.  Eyes:     Extraocular Movements: Extraocular movements intact.     Pupils: Pupils are equal, round, and reactive to light.  Cardiovascular:     Rate and Rhythm: Normal rate and regular rhythm.     Pulses: Normal pulses.     Heart sounds: Normal heart sounds.  Pulmonary:     Effort: Pulmonary effort is normal. No respiratory  distress.     Breath sounds: Normal breath sounds.  Abdominal:     General: Bowel sounds are normal. There is no distension.     Palpations: Abdomen is soft.     Tenderness: There is no abdominal tenderness.  Musculoskeletal:        General: No swelling or deformity.  Skin:    General: Skin is warm and dry.  Neurological:     General: No focal deficit present.  Mental Status: Mental status is at baseline.    Labs on Admission: I have personally reviewed following labs and imaging studies  CBC: Recent Labs  Lab 03/08/23 0227  WBC 7.3  NEUTROABS 3.2  HGB 13.5  HCT 40.5  MCV 91.8  PLT 191    Basic Metabolic Panel: Recent Labs  Lab 03/08/23 0227  NA 139  K 3.2*  CL 105  CO2 24  GLUCOSE 109*  BUN 30*  CREATININE 0.72  CALCIUM 9.2    GFR: Estimated Creatinine Clearance: 38.3 mL/min (by C-G formula based on SCr of 0.72 mg/dL).  Liver Function Tests: Recent Labs  Lab 03/08/23 0227  AST 28  ALT 16  ALKPHOS 80  BILITOT 0.4  PROT 6.9  ALBUMIN 4.0    Urine analysis:    Component Value Date/Time   COLORURINE YELLOW 03/08/2023 0227   APPEARANCEUR CLEAR 03/08/2023 0227   LABSPEC 1.015 03/08/2023 0227   PHURINE 7.0 03/08/2023 0227   GLUCOSEU NEGATIVE 03/08/2023 0227   HGBUR SMALL (A) 03/08/2023 0227   HGBUR moderate 07/05/2010 0923   BILIRUBINUR NEGATIVE 03/08/2023 0227   BILIRUBINUR negative 04/02/2022 1123   BILIRUBINUR neg 01/26/2019 0905   KETONESUR NEGATIVE 03/08/2023 0227   PROTEINUR NEGATIVE 03/08/2023 0227   UROBILINOGEN 0.2 04/02/2022 1123   UROBILINOGEN 0.2 07/05/2010 0923   NITRITE NEGATIVE 03/08/2023 0227   LEUKOCYTESUR TRACE (A) 03/08/2023 0227    Radiological Exams on Admission: CT Angio Chest/Abd/Pel for Dissection W and/or Wo Contrast  Result Date: 03/08/2023 CLINICAL DATA:  Acute aortic syndrome, chest pain, neck pain, back pain. Diaphoresis, dyspnea, bilateral lower extremity numbness/paresthesia. EXAM: CT ANGIOGRAPHY CHEST,  ABDOMEN AND PELVIS TECHNIQUE: Non-contrast CT of the chest was initially obtained. Multidetector CT imaging through the chest, abdomen and pelvis was performed using the standard protocol during bolus administration of intravenous contrast. Multiplanar reconstructed images and MIPs were obtained and reviewed to evaluate the vascular anatomy. RADIATION DOSE REDUCTION: This exam was performed according to the departmental dose-optimization program which includes automated exposure control, adjustment of the mA and/or kV according to patient size and/or use of iterative reconstruction technique. CONTRAST:  75mL OMNIPAQUE IOHEXOL 350 MG/ML SOLN COMPARISON:  CT chest 01/28/2022, CT abdomen pelvis 03/06/2022 FINDINGS: CTA CHEST FINDINGS Cardiovascular: The thoracic aorta is normal in course and caliber. No intramural hematoma, dissection, or aneurysm. Arch vasculature demonstrates normal anatomic configuration and is widely patent proximally. Mild atherosclerotic calcification within the descending thoracic aorta. Moderate multi-vessel coronary artery calcification. Cardiac size within normal limits. Small pericardial effusion is stable since prior examination. Central pulmonary arteries are of normal caliber. Mediastinum/Nodes: Visualized thyroid is unremarkable. No pathologic thoracic adenopathy. Esophagus unremarkable. Small hiatal hernia. Lungs/Pleura: Lungs are clear. No pleural effusion or pneumothorax. Musculoskeletal: Partial right breast resection axillary lymph node dissection has been performed. No acute bone abnormality. No lytic or blastic bone lesion. Review of the MIP images confirms the above findings. CTA ABDOMEN AND PELVIS FINDINGS VASCULAR Aorta: Normal caliber aorta without aneurysm, dissection, vasculitis or significant stenosis. Mild atherosclerotic calcification. Celiac: Patent without evidence of aneurysm, dissection, vasculitis or significant stenosis. SMA: Patent without evidence of aneurysm,  dissection, vasculitis or significant stenosis. Renals: Main renal arteries bilaterally are widely patent and demonstrate normal vascular morphology. Small bilateral accessory renal arteries are noted involving the lower poles. IMA: Patent without evidence of aneurysm, dissection, vasculitis or significant stenosis. Inflow: Patent without evidence of aneurysm, dissection, vasculitis or significant stenosis. Veins: No obvious venous abnormality within the limitations of this arterial phase study.  Review of the MIP images confirms the above findings. NON-VASCULAR Hepatobiliary: The liver is unremarkable. The gallbladder is distended, however, no superimposed pericholecystic changes identified. This appears similar to prior examination. Mild intra and extrahepatic biliary ductal dilation is stable since remote prior examination of 12/09/2021, likely representing senescent delay shin. Pancreas: Unremarkable. No pancreatic ductal dilatation or surrounding inflammatory changes. Spleen: Normal in size without focal abnormality. Adrenals/Urinary Tract: The adrenal glands are unremarkable. The kidneys are normal in size and echogenicity. Multiple parapelvic cysts are seen within the kidneys bilaterally for which no follow-up imaging is recommended. The kidneys are otherwise unremarkable. Bladder unremarkable. Stomach/Bowel: Moderate sigmoid diverticulosis without superimposed acute inflammatory change. The stomach, small bowel, and large bowel are otherwise unremarkable. The appendix is not clearly identified and is likely absent. No free intraperitoneal gas or fluid. Lymphatic: No pathologic adenopathy within the abdomen and pelvis. Reproductive: Status post hysterectomy. No adnexal masses. Other: No abdominal wall hernia Musculoskeletal: No acute bone abnormality. No lytic or blastic bone lesion. Moderate thoracolumbar sigmoid scoliosis noted. Review of the MIP images confirms the above findings. IMPRESSION: 1. No acute  intrathoracic or intra-abdominal pathology identified. No etiology identified for the patient's reported symptoms. 2. Moderate multi-vessel coronary artery calcification. 3. Stable small pericardial effusion. 4. Small hiatal hernia. 5. Moderate sigmoid diverticulosis without superimposed acute inflammatory change. Aortic Atherosclerosis (ICD10-I70.0). Electronically Signed   By: Helyn Numbers M.D.   On: 03/08/2023 04:15   DG Chest 2 View  Result Date: 03/08/2023 CLINICAL DATA:  Chest pain radiating to neck and back. EXAM: CHEST - 2 VIEW COMPARISON:  03/06/2022. FINDINGS: The heart is enlarged and mediastinal contours are within normal limits. No consolidation, effusion, or pneumothorax. No acute osseous abnormality. Surgical clips are present in the right chest. IMPRESSION: No active cardiopulmonary disease. Electronically Signed   By: Thornell Sartorius M.D.   On: 03/08/2023 03:40    EKG: Independently reviewed.  Sinus rhythm at 86 bpm.  Multiple PVCs.  ST abnormality in V2 and V3 with nonspecific T wave changes in lateral leads.  Assessment/Plan Principal Problem:   Unstable angina (HCC) Active Problems:   Hypothyroidism   Essential hypertension   History of breast cancer   S/P stroke due to cerebrovascular disease   Restrictive lung disease   S/P coronary artery stent placement   Unstable angina vs other chest pain CAD > Known history of CAD with prior stenting.  No chest pain at the time of prior stent placement. > Awoke with severe chest pain radiating to neck abdomen and back in the middle of the night.  Had associated diaphoresis but no nausea. > Troponin negative x 4.  Mildly elevated lipase of 52.  Chest x-ray and CT chest abdomen pelvis without acute abnormality. > Started on heparin infusion received aspirin in the ED. > Cardiology consulted and recommended observation with cardiology consultation. - Monitor on telemetry - Appreciate cardiology recommendations and assistance -  Continue with heparin infusion, aspirin - Continue with home atorvastatin - Check A1c, lipid panel - Echocardiogram - Further provocative testing versus intervention per cardiology  HFmrEF combined with diastolic dysfunction. > Last echo in January of this year with EF 40-45%, G1 DD, normal RV function. - Not currently on any diuretics - Continue to monitor - Repeat echo as above  Hypokalemia > K 3.2 in ED - 40 Meq PO K - Check Mg - Trend  Hypertension - Not currently on any antihypertensives  Hypothyroidism - Continue home Synthroid  History of CVA -  Continue home aspirin, atorvastatin  History of breast cancer - Noted  DVT prophylaxis: Heparin Code Status:   Full Family Communication:  Updated at bedside  Disposition Plan:   Patient is from:  Home  Anticipated DC to:  Home  Anticipated DC date:  1 to 3 days  Anticipated DC barriers: None  Consults called:  Cardiology Admission status:  Inpatient, telemetry  Severity of Illness: The appropriate patient status for this patient is INPATIENT. Inpatient status is judged to be reasonable and necessary in order to provide the required intensity of service to ensure the patient's safety. The patient's presenting symptoms, physical exam findings, and initial radiographic and laboratory data in the context of their chronic comorbidities is felt to place them at high risk for further clinical deterioration. Furthermore, it is not anticipated that the patient will be medically stable for discharge from the hospital within 2 midnights of admission.   * I certify that at the point of admission it is my clinical judgment that the patient will require inpatient hospital care spanning beyond 2 midnights from the point of admission due to high intensity of service, high risk for further deterioration and high frequency of surveillance required.Synetta Fail MD [facility]  How to contact the [facility] Attending or  Consulting provider 7A - 7P or covering provider during after hours 7P -7A, for this patient?   Check the care team in [facility] and look for a) attending/consulting [facility] provider listed and b) the [facility] team listed Log into [email or web address] and use [place]'s universal password to access. If you do not have the password, please contact the hospital operator. Locate the [facility] provider you are looking for under [facility] and page to a number that you can be directly reached. If you still have difficulty reaching the provider, please page the [facility] (Director on Call) for the Hospitalists listed on amion for assistance.  03/08/2023, 5:03 PM
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
## 2023-03-08 NOTE — Plan of Care (Signed)
  Problem: Education: Goal: Knowledge of General Education information will improve Description: Including pain rating scale, medication(s)/side effects and non-pharmacologic comfort measures Outcome: Progressing   Problem: Clinical Measurements: Goal: Ability to maintain clinical measurements within normal limits will improve Outcome: Progressing Goal: Will remain free from infection Outcome: Progressing   

## 2023-03-08 NOTE — ED Triage Notes (Addendum)
Chest pain that radiates to neck and back onset 45 minutes pta. Endorses diaphoresis, shob, and BLLE numbness after pain started. Pain woke patient up from sleep. Denies n/v, dizziness. Reports h/o stent placement in June. 10/10, constant, "pressure". States pain has improved since onset.

## 2023-03-08 NOTE — ED Notes (Signed)
[facility] on the floor, paperwork & report given, pt belongings labeled and sent with pt.

## 2023-03-09 ENCOUNTER — Inpatient Hospital Stay (HOSPITAL_BASED_OUTPATIENT_CLINIC_OR_DEPARTMENT_OTHER): Payer: Medicare HMO

## 2023-03-09 DIAGNOSIS — I110 Hypertensive heart disease with heart failure: Secondary | ICD-10-CM | POA: Diagnosis not present

## 2023-03-09 DIAGNOSIS — R079 Chest pain, unspecified: Secondary | ICD-10-CM

## 2023-03-09 DIAGNOSIS — E039 Hypothyroidism, unspecified: Secondary | ICD-10-CM | POA: Diagnosis not present

## 2023-03-09 DIAGNOSIS — Z7982 Long term (current) use of aspirin: Secondary | ICD-10-CM | POA: Diagnosis not present

## 2023-03-09 DIAGNOSIS — I25110 Atherosclerotic heart disease of native coronary artery with unstable angina pectoris: Secondary | ICD-10-CM | POA: Diagnosis not present

## 2023-03-09 DIAGNOSIS — Z853 Personal history of malignant neoplasm of breast: Secondary | ICD-10-CM | POA: Diagnosis not present

## 2023-03-09 DIAGNOSIS — Z955 Presence of coronary angioplasty implant and graft: Secondary | ICD-10-CM | POA: Diagnosis not present

## 2023-03-09 DIAGNOSIS — I200 Unstable angina: Secondary | ICD-10-CM | POA: Diagnosis not present

## 2023-03-09 DIAGNOSIS — Z8673 Personal history of transient ischemic attack (TIA), and cerebral infarction without residual deficits: Secondary | ICD-10-CM | POA: Diagnosis not present

## 2023-03-09 DIAGNOSIS — R9431 Abnormal electrocardiogram [ECG] [EKG]: Secondary | ICD-10-CM | POA: Diagnosis not present

## 2023-03-09 DIAGNOSIS — Z79899 Other long term (current) drug therapy: Secondary | ICD-10-CM | POA: Diagnosis not present

## 2023-03-09 LAB — CBC
HCT: 40.1 % (ref 36.0–46.0)
Hemoglobin: 13.1 g/dL (ref 12.0–15.0)
MCH: 30.2 pg (ref 26.0–34.0)
MCHC: 32.7 g/dL (ref 30.0–36.0)
MCV: 92.4 fL (ref 80.0–100.0)
Platelets: 171 10*3/uL (ref 150–400)
RBC: 4.34 MIL/uL (ref 3.87–5.11)
RDW: 12.8 % (ref 11.5–15.5)
WBC: 9.1 10*3/uL (ref 4.0–10.5)
nRBC: 0 % (ref 0.0–0.2)

## 2023-03-09 LAB — TROPONIN I (HIGH SENSITIVITY): Troponin I (High Sensitivity): 14 ng/L (ref <18)

## 2023-03-09 LAB — BASIC METABOLIC PANEL
Anion gap: 7 (ref 5–15)
BUN: 23 mg/dL (ref 8–23)
CO2: 24 mmol/L (ref 22–32)
Calcium: 9 mg/dL (ref 8.9–10.3)
Chloride: 107 mmol/L (ref 98–111)
Creatinine, Ser: 0.83 mg/dL (ref 0.44–1.00)
GFR, Estimated: >60 mL/min (ref >60)
Glucose, Bld: 80 mg/dL (ref 70–99)
Potassium: 3.6 mmol/L (ref 3.5–5.1)
Sodium: 138 mmol/L (ref 135–145)

## 2023-03-09 LAB — HEMOGLOBIN A1C
Hgb A1c MFr Bld: 6 % — ABNORMAL HIGH (ref 4.8–5.6)
Mean Plasma Glucose: 125.5 mg/dL

## 2023-03-09 LAB — LIPID PANEL
Cholesterol: 133 mg/dL (ref 0–200)
HDL: 52 mg/dL (ref >40)
LDL Cholesterol: 71 mg/dL (ref 0–99)
Total CHOL/HDL Ratio: 2.6 RATIO
Triglycerides: 49 mg/dL (ref <150)
VLDL: 10 mg/dL (ref 0–40)

## 2023-03-09 LAB — ECHOCARDIOGRAM COMPLETE
AR max vel: 2.15 cm2
AV Area VTI: 2.16 cm2
AV Area mean vel: 2.12 cm2
AV Mean grad: 3 mmHg
AV Peak grad: 5.5 mmHg
Ao pk vel: 1.17 m/s
Area-P 1/2: 5.75 cm2
Calc EF: 44.2 %
Height: 60 in
MV VTI: 2.11 cm2
S' Lateral: 3.1 cm
Single Plane A2C EF: 44.3 %
Single Plane A4C EF: 42.4 %
Weight: 1880 [oz_av]

## 2023-03-09 LAB — HEPARIN LEVEL (UNFRACTIONATED): Heparin Unfractionated: 0.39 [IU]/mL (ref 0.30–0.70)

## 2023-03-09 LAB — MAGNESIUM: Magnesium: 1.8 mg/dL (ref 1.7–2.4)

## 2023-03-09 NOTE — TOC Initial Note (Signed)
Transition of Care [facility]) - Initial/Assessment Note    Patient Details  Name: Kathleen [name] MRN: [id number] Date of Birth: 1940-12-28  [facility] CM/SW Contact:    Leone Haven, RN Phone Number: 03/09/2023, 11:17 AM  Clinical Narrative:                  From home with spouse, has PCP and insurance on file, states has no HH services in place at this time ,she has a walker that she does not use.  States daughter will transport her home at [employer] and family is support system, states gets medications from [facility] in [place].  Pta self ambulatory        Patient Goals and CMS Choice            Expected Discharge Plan and Services         Expected Discharge Date: 03/09/23                                    Prior Living Arrangements/Services                       Activities of Daily Living      Permission Sought/Granted                  Emotional Assessment              Admission diagnosis:  Unstable angina (HCC) [I20.0] Abnormal EKG [R94.31] Chest pain [R07.9] Patient Active Problem List   Diagnosis Date Noted   Chest pain 03/09/2023   Unstable angina (HCC) 03/08/2023   Heart failure with mildly reduced ejection fraction (HFmrEF) (HCC) 03/08/2023   Orthostasis 03/12/2022   S/P coronary artery stent placement 03/12/2022   Abnormal cardiac CT angiography 02/10/2022   Lumbar foraminal stenosis 09/12/2021   Weight loss 07/30/2021   Restrictive lung disease 05/15/2021   S/P stroke due to cerebrovascular disease 09/17/2018   Primary osteoarthritis of left hip 07/08/2017   Primary osteoarthritis of both knees 02/25/2017   Lumbar spondylosis 12/03/2016   History of breast cancer 11/09/2012   Benign hematuria 07/05/2010   Impaired fasting glucose 12/11/2008   Disorder of bone and cartilage 02/26/2007   Hypothyroidism 04/28/2006   Essential hypertension 04/28/2006   MITRAL VALVE DISORDER 04/28/2006   PCP:   Agapito Games, MD Pharmacy:   [facility] [place],  - [address] [place] [address] Phone: [phone] Fax: [phone]     Social Determinants of Health (SDOH) Social History: SDOH Screenings   Food Insecurity: No Food Insecurity (04/12/2021)  Housing: Low Risk  (04/12/2021)  Transportation Needs: No Transportation Needs (04/12/2021)  Alcohol Screen: Low Risk  (04/12/2021)  Depression (PHQ2-9): Low Risk  (02/03/2023)  Financial Resource Strain: Low Risk  (04/12/2021)  Physical Activity: Inactive (04/12/2021)  Social Connections: Moderately Isolated (04/12/2021)  Stress: No Stress Concern Present (04/12/2021)  Tobacco Use: Low Risk  (03/08/2023)   SDOH Interventions:     Readmission Risk Interventions     No data to display

## 2023-03-09 NOTE — Hospital Course (Addendum)
82 y.o.f w/ HTN, CAD status post stenting, hypothyroidism, CVA, obstructive lung disease, breast cancer, HFmrEF presenting with chest pain radiating to the neck, abdomen, back w/ associated diaphoresis. ED Course: BP:120s to 160s systolic.potassium 3.2, BUN 30, glucose 109.  CBC within normal limits.  Troponin negative x 4.  Lipase 52.  Urinalysis with hemoglobin and leukocytes only.  Chest x-ray showed no acute abnormality. CT of the chest abdomen pelvis showed no acute abnormality, did demonstrate CAD, stable small pericardial effusion, diverticulosis. Patient was given aspirin placed on heparin cardiology consulted and admitted Echo done and stable no changes- informed by Cards that she can go home cont her home meds

## 2023-03-09 NOTE — Care Management CC44 (Signed)
Condition Code 44 Documentation Completed  Patient Details  Name: Kathleen [name] MRN: [id number] Date of Birth: [birth year]   Condition Code 44 given:  Yes Patient signature on Condition Code 44 notice:  Yes Documentation of 2 MD's agreement:  Yes Code 44 added to claim:  Yes    Leone Haven, RN 03/09/2023, 11:28 AM

## 2023-03-09 NOTE — Progress Notes (Signed)
Progress Note  Patient Name: Kathleen [name] Date of Encounter: 03/09/2023  Primary Cardiologist:   Tonny Bollman, MD   Subjective   No further chest pain.  No SOB.  Feels OK.    Inpatient Medications    Scheduled Meds:  aspirin EC  81 mg Oral Daily   atorvastatin  80 mg Oral Daily   hydrALAZINE  25 mg Oral Once   levothyroxine  75 mcg Oral Q0600   Continuous Infusions:  heparin 600 Units/hr (03/09/23 0204)   PRN Meds: acetaminophen, nitroGLYCERIN, ondansetron (ZOFRAN) IV   Vital Signs    Vitals:   03/08/23 2027 03/09/23 0000 03/09/23 0446 03/09/23 0805  BP: (!) 129/57 134/62 (!) 135/57 111/79  Pulse: 67 70 83 81  Resp: 18 18 18 18   Temp: 97.8 F (36.6 C) 97.9 F (36.6 C) 97.9 F (36.6 C) 98.1 F (36.7 C)  TempSrc: Oral Oral Oral Oral  SpO2: 98% 97% 99% 97%  Weight:   53.3 kg   Height:        Intake/Output Summary (Last 24 hours) at 03/09/2023 1016 Last data filed at 03/09/2023 0743 Gross per 24 hour  Intake 128.68 ml  Output 800 ml  Net -671.32 ml   Filed Weights   03/08/23 0219 03/08/23 1704 03/09/23 0446  Weight: 49.9 kg 53.3 kg 53.3 kg    Telemetry    NSR - Personally Reviewed  ECG    NA - Personally Reviewed  Physical Exam   GEN: No acute distress.   Neck: No  JVD Cardiac: RRR, no murmurs, rubs, or gallops.  Respiratory: Clear  to auscultation bilaterally. GI: Soft, nontender, non-distended  MS: No  edema; No deformity. Neuro:  Nonfocal  Psych: Normal affect   Labs    Chemistry Recent Labs  Lab 03/08/23 0227 03/09/23 0139  NA 139 138  K 3.2* 3.6  CL 105 107  CO2 24 24  GLUCOSE 109* 80  BUN 30* 23  CREATININE 0.72 0.83  CALCIUM 9.2 9.0  PROT 6.9  --   ALBUMIN 4.0  --   AST 28  --   ALT 16  --   ALKPHOS 80  --   BILITOT 0.4  --   GFRNONAA >60 >60  ANIONGAP 10 7     Hematology Recent Labs  Lab 03/08/23 0227 03/09/23 0139  WBC 7.3 9.1  RBC 4.41 4.34  HGB 13.5 13.1  HCT 40.5 40.1  MCV 91.8 92.4  MCH 30.6  30.2  MCHC 33.3 32.7  RDW 12.8 12.8  PLT 191 171    Cardiac EnzymesNo results for input(s): "TROPONINI" in the last 168 hours. No results for input(s): "TROPIPOC" in the last 168 hours.   BNPNo results for input(s): "BNP", "PROBNP" in the last 168 hours.   DDimer No results for input(s): "DDIMER" in the last 168 hours.   Radiology    CT Angio Chest/Abd/Pel for Dissection W and/or Wo Contrast  Result Date: 03/08/2023 CLINICAL DATA:  Acute aortic syndrome, chest pain, neck pain, back pain. Diaphoresis, dyspnea, bilateral lower extremity numbness/paresthesia. EXAM: CT ANGIOGRAPHY CHEST, ABDOMEN AND PELVIS TECHNIQUE: Non-contrast CT of the chest was initially obtained. Multidetector CT imaging through the chest, abdomen and pelvis was performed using the standard protocol during bolus administration of intravenous contrast. Multiplanar reconstructed images and MIPs were obtained and reviewed to evaluate the vascular anatomy. RADIATION DOSE REDUCTION: This exam was performed according to the departmental dose-optimization program which includes automated exposure control, adjustment of the mA and/or  kV according to patient size and/or use of iterative reconstruction technique. CONTRAST:  75mL OMNIPAQUE IOHEXOL 350 MG/ML SOLN COMPARISON:  CT chest 01/28/2022, CT abdomen pelvis 03/06/2022 FINDINGS: CTA CHEST FINDINGS Cardiovascular: The thoracic aorta is normal in course and caliber. No intramural hematoma, dissection, or aneurysm. Arch vasculature demonstrates normal anatomic configuration and is widely patent proximally. Mild atherosclerotic calcification within the descending thoracic aorta. Moderate multi-vessel coronary artery calcification. Cardiac size within normal limits. Small pericardial effusion is stable since prior examination. Central pulmonary arteries are of normal caliber. Mediastinum/Nodes: Visualized thyroid is unremarkable. No pathologic thoracic adenopathy. Esophagus unremarkable.  Small hiatal hernia. Lungs/Pleura: Lungs are clear. No pleural effusion or pneumothorax. Musculoskeletal: Partial right breast resection axillary lymph node dissection has been performed. No acute bone abnormality. No lytic or blastic bone lesion. Review of the MIP images confirms the above findings. CTA ABDOMEN AND PELVIS FINDINGS VASCULAR Aorta: Normal caliber aorta without aneurysm, dissection, vasculitis or significant stenosis. Mild atherosclerotic calcification. Celiac: Patent without evidence of aneurysm, dissection, vasculitis or significant stenosis. SMA: Patent without evidence of aneurysm, dissection, vasculitis or significant stenosis. Renals: Main renal arteries bilaterally are widely patent and demonstrate normal vascular morphology. Small bilateral accessory renal arteries are noted involving the lower poles. IMA: Patent without evidence of aneurysm, dissection, vasculitis or significant stenosis. Inflow: Patent without evidence of aneurysm, dissection, vasculitis or significant stenosis. Veins: No obvious venous abnormality within the limitations of this arterial phase study. Review of the MIP images confirms the above findings. NON-VASCULAR Hepatobiliary: The liver is unremarkable. The gallbladder is distended, however, no superimposed pericholecystic changes identified. This appears similar to prior examination. Mild intra and extrahepatic biliary ductal dilation is stable since remote prior examination of 12/09/2021, likely representing senescent delay shin. Pancreas: Unremarkable. No pancreatic ductal dilatation or surrounding inflammatory changes. Spleen: Normal in size without focal abnormality. Adrenals/Urinary Tract: The adrenal glands are unremarkable. The kidneys are normal in size and echogenicity. Multiple parapelvic cysts are seen within the kidneys bilaterally for which no follow-up imaging is recommended. The kidneys are otherwise unremarkable. Bladder unremarkable. Stomach/Bowel:  Moderate sigmoid diverticulosis without superimposed acute inflammatory change. The stomach, small bowel, and large bowel are otherwise unremarkable. The appendix is not clearly identified and is likely absent. No free intraperitoneal gas or fluid. Lymphatic: No pathologic adenopathy within the abdomen and pelvis. Reproductive: Status post hysterectomy. No adnexal masses. Other: No abdominal wall hernia Musculoskeletal: No acute bone abnormality. No lytic or blastic bone lesion. Moderate thoracolumbar sigmoid scoliosis noted. Review of the MIP images confirms the above findings. IMPRESSION: 1. No acute intrathoracic or intra-abdominal pathology identified. No etiology identified for the patient's reported symptoms. 2. Moderate multi-vessel coronary artery calcification. 3. Stable small pericardial effusion. 4. Small hiatal hernia. 5. Moderate sigmoid diverticulosis without superimposed acute inflammatory change. Aortic Atherosclerosis (ICD10-I70.0). Electronically Signed   By: Helyn Numbers M.D.   On: 03/08/2023 04:15   DG Chest 2 View  Result Date: 03/08/2023 CLINICAL DATA:  Chest pain radiating to neck and back. EXAM: CHEST - 2 VIEW COMPARISON:  03/06/2022. FINDINGS: The heart is enlarged and mediastinal contours are within normal limits. No consolidation, effusion, or pneumothorax. No acute osseous abnormality. Surgical clips are present in the right chest. IMPRESSION: No active cardiopulmonary disease. Electronically Signed   By: Thornell Sartorius M.D.   On: 03/08/2023 03:40    Cardiac Studies   Echo:  Pending.    Patient Profile     82 y.o. female with a hx of pericardial effusion, CAD, HLD  and history of right breast cancer who is being seen 03/08/2023 for the evaluation of chest pain and back pain at the request of Dr. Janalyn Shy.   Assessment & Plan    Chest pain:  Non anginal.  Echo final pending but looks unchanged from previous.  I looked at the last cath.  I don't suspect a new acute coronary  syndrome.  Enzymes are normal.  EKG is unchanged.  I would not suggest further cardiac work up.  I would suggest out patient work up if she has recurrent pain.   CAD: h/o DES to prox LAD 02/10/2022:  See above.    Ischemic cardiomyopathy: EF 40-45% .    Mildly reduced EF.  Continue GDMT.   Hyperlipidemia:   Continue Lipitor.    For questions or updates, please contact [facility] Please consult [email or web address] for contact info under Cardiology/STEMI.   Signed, Rollene Rotunda, MD  03/09/2023, 10:16 AM

## 2023-03-09 NOTE — Progress Notes (Signed)
ANTICOAGULATION CONSULT NOTE   Pharmacy Consult for heparin Indication: chest pain/ACS  Allergies  Allergen Reactions   Streptomycin Other (See Comments)    Passed out   Amlodipine Other (See Comments)    Dizziness on 5mg     Codeine Other (See Comments)    Unknown   Elemental Sulfur     Other reaction(s): Other (See Comments) Pt not sure   Penicillins Other (See Comments)    Pass out   Sulfa Antibiotics Other (See Comments)    Unknown   Sulfonamide Derivatives Other (See Comments)    Unknown   Sulfur     Other Reaction(s): Other (See Comments)  Pt not sure   Benicar [Olmesartan] Other (See Comments)    Diizzy   Hctz [Hydrochlorothiazide] Other (See Comments)    Vertigo, dizziness.    Lisinopril Other (See Comments)    lightheaded    Patient Measurements: Height: 5' (152.4 cm) Weight: 53.3 kg (117 lb 8 oz) IBW/kg (Calculated) : 45.5 Heparin Dosing Weight: 49.9 kg   Vital Signs: Temp: 98.1 F (36.7 C) (08/19 0805) Temp Source: Oral (08/19 0805) BP: 111/79 (08/19 0805) Pulse Rate: 81 (08/19 0805)  Labs: Recent Labs    03/08/23 0227 03/08/23 0416 03/08/23 0628 03/08/23 0809 03/08/23 1409 03/08/23 2010 03/09/23 0139  HGB 13.5  --   --   --   --   --  13.1  HCT 40.5  --   --   --   --   --  40.1  PLT 191  --   --   --   --   --  171  HEPARINUNFRC  --   --   --   --  0.68 0.42 0.39  CREATININE 0.72  --   --   --   --   --  0.83  TROPONINIHS 13   < > 16 17  --   --  14   < > = values in this interval not displayed.    Estimated Creatinine Clearance: 36.9 mL/min (by C-G formula based on SCr of 0.83 mg/dL).   Medical History: Past Medical History:  Diagnosis Date   Cancer (HCC)    Hx RT breast infiltrated ductal CA previously on tamoxifen and armidex    Complete tear of left rotator cuff 01/01/2022   Gross hematuria 12/04/2021   Herpes simplex    lt eye   HIP PAIN, RIGHT 01/05/2008   Qualifier: Diagnosis of   By: Cathey Endow DO, Karen          Hypertension    Impingement syndrome of left shoulder 12/02/2021   Internal derangement of left knee 10/09/2016   Kidney stones    history   LEG PAIN, BILATERAL 01/20/2008   Qualifier: Diagnosis of   By: Linford Arnold MD, Catherine         MVP (mitral valve prolapse)    asymptomatic   Personal history of chemotherapy    Personal history of radiation therapy    Radial styloid fracture 09/17/2021    Assessment: Kathleen [name] is a 82 y.o. year old female admitted on 03/08/2023 with concern for unstable angina. No anticoagulation prior to admission. Pharmacy consulted to dose heparin.  Heparin level came back therapeutic 0.39 on current dose heparin drip 600 uts/hr  Cbc stable  Goal of Therapy:  Heparin level 0.3-0.7 units/ml Monitor platelets by anticoagulation protocol: Yes   Plan:  Continue heparin 600 units/hr  Daily heparin level and CBC   Leota Sauers Pharm.D. CPP, BCPS  Clinical Pharmacist [phone] 03/09/2023 11:18 AM

## 2023-03-09 NOTE — Progress Notes (Signed)
Reviewed d/c instructions with pt and family, no concerns noted

## 2023-03-09 NOTE — Consult Note (Signed)
   [facility] CM Inpatient Consult   03/09/2023  Fatouma Session [birth year] [id number]  [facility] [[facility]]  Accountable Care Organization [ACO] Patient: [facility] insurance  Primary Care Provider:  Agapito Games, MD with [facility] is listed to provide the transition of care follow up calls and appointments  Patient screened for hospitalization with noted in unit progression meeting for community needs to assess for potential [facility]  [[facility]] Care Management service needs for post hospital transition for care coordination.  Review of patient's electronic medical record reveals patient is from home with husband and no new needs has ongoing medical follow up.  Plan:  Continue to follow progress and disposition to assess for post hospital community care coordination/management needs.  Referral request for community care coordination: anticipate the community [facility] for follow up  Of note, [facility] Care Management/Population Health does not replace or interfere with any arrangements made by the Inpatient Transition of Care team.  For questions contact:   Charlesetta Shanks, RN BSN CCM [facility]  [phone] business mobile phone Toll free office [phone]  Fax number: [phone] [email or web address]

## 2023-03-09 NOTE — Plan of Care (Signed)
  Problem: Activity: Goal: Risk for activity intolerance will decrease Outcome: Progressing   

## 2023-03-09 NOTE — Discharge Summary (Signed)
Physician Discharge Summary  Kathleen [name] WUJ:[id number] DOB: 1941/01/25 DOA: 03/08/2023  PCP: Agapito Games, MD  Admit date: 03/08/2023 Discharge date: 03/09/2023 Recommendations for Outpatient Follow-up:  Follow up with PCP in 1 weeks-call for appointment Please obtain BMP/CBC in one week  Discharge Dispo: home Discharge Condition: Stable Code Status:   Code Status: Full Code Diet recommendation:  Diet Order             Diet Heart Room service appropriate? Yes; Fluid consistency: Thin  Diet effective now                    Brief/Interim Summary: 82 y.o.f w/ HTN, CAD status post stenting, hypothyroidism, CVA, obstructive lung disease, breast cancer, HFmrEF presenting with chest pain radiating to the neck, abdomen, back w/ associated diaphoresis. ED Course: BP:120s to 160s systolic.potassium 3.2, BUN 30, glucose 109.  CBC within normal limits.  Troponin negative x 4.  Lipase 52.  Urinalysis with hemoglobin and leukocytes only.  Chest x-ray showed no acute abnormality. CT of the chest abdomen pelvis showed no acute abnormality, did demonstrate CAD, stable small pericardial effusion, diverticulosis. Patient was given aspirin placed on heparin cardiology consulted and admitted Echo done and stable no changes- informed by Cards that she can go home cont her home meds    Discharge Diagnoses:  Principal Problem:   Unstable angina (HCC) Active Problems:   Hypothyroidism   Essential hypertension   History of breast cancer   S/P stroke due to cerebrovascular disease   Restrictive lung disease   S/P coronary artery stent placement   Heart failure with mildly reduced ejection fraction (HFmrEF) (HCC)   Chest pain  Chest pain CAD: EKG without ischemic changes, serial troponins negative.  Seen by cardiology, CTA negative for aortic pathology.  Follow-up echo -no new changed.  Per cardiology okay to discharge home this morning continue home on aspirin 81 Lipitor 80 LDL 71 A1c  6.1.  HFmrEF: Euvolemic followed by cardiology as outpatient  Hypokalemia: Resolved  HTN: BP controlled not on meds  Hypothyroidism: Continue Synthroid  History of stroke: Continue home aspirin and statin  History of breast cancer.  Consults: Cardiology  Subjective: Aaox3 no chest pain.  Discharge Exam: Vitals:   03/09/23 0446 03/09/23 0805  BP: (!) 135/57 111/79  Pulse: 83 81  Resp: 18 18  Temp: 97.9 F (36.6 C) 98.1 F (36.7 C)  SpO2: 99% 97%   General: Pt is alert, awake, not in acute distress Cardiovascular: RRR, S1/S2 +, no rubs, no gallops Respiratory: CTA bilaterally, no wheezing, no rhonchi Abdominal: Soft, NT, ND, bowel sounds + Extremities: no edema, no cyanosis  Discharge Instructions  Discharge Instructions     Discharge instructions   Complete by: As directed    Please call call MD or return to ER for similar or worsening recurring problem that brought you to hospital or if any fever,nausea/vomiting,abdominal pain, uncontrolled pain, chest pain,  shortness of breath or any other alarming symptoms.  Please follow-up your doctor as instructed in a week time and call the office for appointment.  Please avoid alcohol, smoking, or any other illicit substance and maintain healthy habits including taking your regular medications as prescribed.  You were cared for by a hospitalist during your hospital stay. If you have any questions about your discharge medications or the care you received while you were in the hospital after you are discharged, you can call the unit and ask to speak with the hospitalist on call  if the hospitalist that took care of you is not available.  Once you are discharged, your primary care physician will handle any further medical issues. Please note that NO REFILLS for any discharge medications will be authorized once you are discharged, as it is imperative that you return to your primary care physician (or establish a relationship  with a primary care physician if you do not have one) for your aftercare needs so that they can reassess your need for medications and monitor your lab values   Increase activity slowly   Complete by: As directed       Allergies as of 03/09/2023       Reactions   Streptomycin Other (See Comments)   Passed out   Amlodipine Other (See Comments)   Dizziness on 5mg    Codeine Other (See Comments)   Unknown   Elemental Sulfur    Other reaction(s): Other (See Comments) Pt not sure   Penicillins Other (See Comments)   Pass out   Sulfa Antibiotics Other (See Comments)   Unknown   Sulfonamide Derivatives Other (See Comments)   Unknown   Sulfur    Other Reaction(s): Other (See Comments) Pt not sure   Benicar [olmesartan] Other (See Comments)   Diizzy   Hctz [hydrochlorothiazide] Other (See Comments)   Vertigo, dizziness.    Lisinopril Other (See Comments)   lightheaded        Medication List     TAKE these medications    aspirin EC 81 MG tablet Take 81 mg by mouth daily.   atorvastatin 80 MG tablet Commonly known as: LIPITOR TAKE ONE (1) TABLET BY MOUTH EVERY DAY What changed: how much to take   IRON PO Take 65 mg by mouth daily.   levothyroxine 75 MCG tablet Commonly known as: SYNTHROID TAKE ONE (1) TABLET BY MOUTH EACH DAY What changed: See the new instructions.   multivitamin with minerals tablet Take 1 tablet by mouth daily.   TURMERIC PO Take 1 capsule by mouth daily.   valACYclovir 500 MG tablet Commonly known as: VALTREX TAKE ONE (1) TABLET BY MOUTH EVERY DAY What changed: how much to take   Vitamin D 125 MCG (5000 UT) Caps Take 5,000 Units by mouth daily.   ZINC 15 PO Take 1 tablet by mouth daily.        Follow-up Information     Agapito Games, MD. Go on 03/11/2023.   Specialty: Family Medicine Why: @10 :10am Contact information: [address] [place] [address] [phone]                 Allergies  Allergen Reactions   Streptomycin Other (See Comments)    Passed out   Amlodipine Other (See Comments)    Dizziness on 5mg     Codeine Other (See Comments)    Unknown   Elemental Sulfur     Other reaction(s): Other (See Comments) Pt not sure   Penicillins Other (See Comments)    Pass out   Sulfa Antibiotics Other (See Comments)    Unknown   Sulfonamide Derivatives Other (See Comments)    Unknown   Sulfur     Other Reaction(s): Other (See Comments)  Pt not sure   Benicar [Olmesartan] Other (See Comments)    Diizzy   Hctz [Hydrochlorothiazide] Other (See Comments)    Vertigo, dizziness.    Lisinopril Other (See Comments)    lightheaded    The results of significant diagnostics from this hospitalization (  including imaging, microbiology, ancillary and laboratory) are listed below for reference.    Microbiology: No results found for this or any previous visit (from the past 240 hour(s)).  Procedures/Studies: CT Angio Chest/Abd/Pel for Dissection W and/or Wo Contrast  Result Date: 03/08/2023 CLINICAL DATA:  Acute aortic syndrome, chest pain, neck pain, back pain. Diaphoresis, dyspnea, bilateral lower extremity numbness/paresthesia. EXAM: CT ANGIOGRAPHY CHEST, ABDOMEN AND PELVIS TECHNIQUE: Non-contrast CT of the chest was initially obtained. Multidetector CT imaging through the chest, abdomen and pelvis was performed using the standard protocol during bolus administration of intravenous contrast. Multiplanar reconstructed images and MIPs were obtained and reviewed to evaluate the vascular anatomy. RADIATION DOSE REDUCTION: This exam was performed according to the departmental dose-optimization program which includes automated exposure control, adjustment of the mA and/or kV according to patient size and/or use of iterative reconstruction technique. CONTRAST:  75mL OMNIPAQUE IOHEXOL 350 MG/ML SOLN COMPARISON:  CT chest 01/28/2022, CT abdomen pelvis 03/06/2022 FINDINGS: CTA  CHEST FINDINGS Cardiovascular: The thoracic aorta is normal in course and caliber. No intramural hematoma, dissection, or aneurysm. Arch vasculature demonstrates normal anatomic configuration and is widely patent proximally. Mild atherosclerotic calcification within the descending thoracic aorta. Moderate multi-vessel coronary artery calcification. Cardiac size within normal limits. Small pericardial effusion is stable since prior examination. Central pulmonary arteries are of normal caliber. Mediastinum/Nodes: Visualized thyroid is unremarkable. No pathologic thoracic adenopathy. Esophagus unremarkable. Small hiatal hernia. Lungs/Pleura: Lungs are clear. No pleural effusion or pneumothorax. Musculoskeletal: Partial right breast resection axillary lymph node dissection has been performed. No acute bone abnormality. No lytic or blastic bone lesion. Review of the MIP images confirms the above findings. CTA ABDOMEN AND PELVIS FINDINGS VASCULAR Aorta: Normal caliber aorta without aneurysm, dissection, vasculitis or significant stenosis. Mild atherosclerotic calcification. Celiac: Patent without evidence of aneurysm, dissection, vasculitis or significant stenosis. SMA: Patent without evidence of aneurysm, dissection, vasculitis or significant stenosis. Renals: Main renal arteries bilaterally are widely patent and demonstrate normal vascular morphology. Small bilateral accessory renal arteries are noted involving the lower poles. IMA: Patent without evidence of aneurysm, dissection, vasculitis or significant stenosis. Inflow: Patent without evidence of aneurysm, dissection, vasculitis or significant stenosis. Veins: No obvious venous abnormality within the limitations of this arterial phase study. Review of the MIP images confirms the above findings. NON-VASCULAR Hepatobiliary: The liver is unremarkable. The gallbladder is distended, however, no superimposed pericholecystic changes identified. This appears similar to  prior examination. Mild intra and extrahepatic biliary ductal dilation is stable since remote prior examination of 12/09/2021, likely representing senescent delay shin. Pancreas: Unremarkable. No pancreatic ductal dilatation or surrounding inflammatory changes. Spleen: Normal in size without focal abnormality. Adrenals/Urinary Tract: The adrenal glands are unremarkable. The kidneys are normal in size and echogenicity. Multiple parapelvic cysts are seen within the kidneys bilaterally for which no follow-up imaging is recommended. The kidneys are otherwise unremarkable. Bladder unremarkable. Stomach/Bowel: Moderate sigmoid diverticulosis without superimposed acute inflammatory change. The stomach, small bowel, and large bowel are otherwise unremarkable. The appendix is not clearly identified and is likely absent. No free intraperitoneal gas or fluid. Lymphatic: No pathologic adenopathy within the abdomen and pelvis. Reproductive: Status post hysterectomy. No adnexal masses. Other: No abdominal wall hernia Musculoskeletal: No acute bone abnormality. No lytic or blastic bone lesion. Moderate thoracolumbar sigmoid scoliosis noted. Review of the MIP images confirms the above findings. IMPRESSION: 1. No acute intrathoracic or intra-abdominal pathology identified. No etiology identified for the patient's reported symptoms. 2. Moderate multi-vessel coronary artery calcification. 3. Stable small pericardial effusion.  4. Small hiatal hernia. 5. Moderate sigmoid diverticulosis without superimposed acute inflammatory change. Aortic Atherosclerosis (ICD10-I70.0). Electronically Signed   By: Helyn Numbers M.D.   On: 03/08/2023 04:15   DG Chest 2 View  Result Date: 03/08/2023 CLINICAL DATA:  Chest pain radiating to neck and back. EXAM: CHEST - 2 VIEW COMPARISON:  03/06/2022. FINDINGS: The heart is enlarged and mediastinal contours are within normal limits. No consolidation, effusion, or pneumothorax. No acute osseous  abnormality. Surgical clips are present in the right chest. IMPRESSION: No active cardiopulmonary disease. Electronically Signed   By: Thornell Sartorius M.D.   On: 03/08/2023 03:40    Labs: BNP (last 3 results) Recent Labs    02/03/23 1114  BNP CANCELED   Basic Metabolic Panel: Recent Labs  Lab 03/08/23 0227 03/09/23 0139  NA 139 138  K 3.2* 3.6  CL 105 107  CO2 24 24  GLUCOSE 109* 80  BUN 30* 23  CREATININE 0.72 0.83  CALCIUM 9.2 9.0  MG  --  1.8   Liver Function Tests: Recent Labs  Lab 03/08/23 0227  AST 28  ALT 16  ALKPHOS 80  BILITOT 0.4  PROT 6.9  ALBUMIN 4.0   Recent Labs  Lab 03/08/23 0227  LIPASE 52*   No results for input(s): "AMMONIA" in the last 168 hours. CBC: Recent Labs  Lab 03/08/23 0227 03/09/23 0139  WBC 7.3 9.1  NEUTROABS 3.2  --   HGB 13.5 13.1  HCT 40.5 40.1  MCV 91.8 92.4  PLT 191 171   Cardiac Enzymes: No results for input(s): "CKTOTAL", "CKMB", "CKMBINDEX", "TROPONINI" in the last 168 hours. BNP: Invalid input(s): "POCBNP" CBG: No results for input(s): "GLUCAP" in the last 168 hours. D-Dimer No results for input(s): "DDIMER" in the last 72 hours. Hgb A1c Recent Labs    03/09/23 0139  HGBA1C 6.0*   Lipid Profile Recent Labs    03/09/23 0139  CHOL 133  HDL 52  LDLCALC 71  TRIG 49  CHOLHDL 2.6   Thyroid function studies No results for input(s): "TSH", "T4TOTAL", "T3FREE", "THYROIDAB" in the last 72 hours.  Invalid input(s): "FREET3" Anemia work up No results for input(s): "VITAMINB12", "FOLATE", "FERRITIN", "TIBC", "IRON", "RETICCTPCT" in the last 72 hours. Urinalysis    Component Value Date/Time   COLORURINE YELLOW 03/08/2023 0227   APPEARANCEUR CLEAR 03/08/2023 0227   LABSPEC 1.015 03/08/2023 0227   PHURINE 7.0 03/08/2023 0227   GLUCOSEU NEGATIVE 03/08/2023 0227   HGBUR SMALL (A) 03/08/2023 0227   HGBUR moderate 07/05/2010 0923   BILIRUBINUR NEGATIVE 03/08/2023 0227   BILIRUBINUR negative 04/02/2022 1123    BILIRUBINUR neg 01/26/2019 0905   KETONESUR NEGATIVE 03/08/2023 0227   PROTEINUR NEGATIVE 03/08/2023 0227   UROBILINOGEN 0.2 04/02/2022 1123   UROBILINOGEN 0.2 07/05/2010 0923   NITRITE NEGATIVE 03/08/2023 0227   LEUKOCYTESUR TRACE (A) 03/08/2023 0227   Sepsis Labs Recent Labs  Lab 03/08/23 0227 03/09/23 0139  WBC 7.3 9.1   Microbiology No results found for this or any previous visit (from the past 240 hour(s)).   Time coordinating discharge: 25 minutes  SIGNED: Lanae Boast, MD  [facility] 03/09/2023, 11:49 AM  If 7PM-7AM, please contact night-coverage [email or web address]

## 2023-03-09 NOTE — TOC Transition Note (Signed)
Transition of Care [facility]) - CM/SW Discharge Note   Patient Details  Name: Kathleen [name] MRN: [id number] Date of Birth: 1941-03-27  Transition of Care [facility]) CM/SW Contact:  Leone Haven, RN Phone Number: 03/09/2023, 11:19 AM   Clinical Narrative:    For dc today, daughter will transport home , she has no needs.         Patient Goals and CMS Choice      Discharge Placement                         Discharge Plan and Services Additional resources added to the After Visit Summary for                                       Social Determinants of Health (SDOH) Interventions SDOH Screenings   Food Insecurity: No Food Insecurity (04/12/2021)  Housing: Low Risk  (04/12/2021)  Transportation Needs: No Transportation Needs (04/12/2021)  Alcohol Screen: Low Risk  (04/12/2021)  Depression (PHQ2-9): Low Risk  (02/03/2023)  Financial Resource Strain: Low Risk  (04/12/2021)  Physical Activity: Inactive (04/12/2021)  Social Connections: Moderately Isolated (04/12/2021)  Stress: No Stress Concern Present (04/12/2021)  Tobacco Use: Low Risk  (03/08/2023)     Readmission Risk Interventions     No data to display

## 2023-03-09 NOTE — Progress Notes (Signed)
Echocardiogram 2D Echocardiogram has been performed.  Huan Pollok N Loeta Herst,RDCS 03/09/2023, 9:57 AM

## 2023-03-09 NOTE — Care Management Obs Status (Signed)
MEDICARE OBSERVATION STATUS NOTIFICATION   Patient Details  Name: Kathleen [name] MRN: [id number] Date of Birth: 1940/08/05   Medicare Observation Status Notification Given:  Yes    Leone Haven, RN 03/09/2023, 11:28 AM

## 2023-03-11 ENCOUNTER — Ambulatory Visit (INDEPENDENT_AMBULATORY_CARE_PROVIDER_SITE_OTHER): Payer: Medicare HMO | Admitting: Family Medicine

## 2023-03-11 ENCOUNTER — Encounter: Payer: Self-pay | Admitting: Family Medicine

## 2023-03-11 VITALS — BP 154/73 | HR 78 | Ht 60.0 in | Wt 117.0 lb

## 2023-03-11 DIAGNOSIS — N644 Mastodynia: Secondary | ICD-10-CM | POA: Diagnosis not present

## 2023-03-11 DIAGNOSIS — Z853 Personal history of malignant neoplasm of breast: Secondary | ICD-10-CM

## 2023-03-11 DIAGNOSIS — I5189 Other ill-defined heart diseases: Secondary | ICD-10-CM | POA: Diagnosis not present

## 2023-03-11 DIAGNOSIS — I10 Essential (primary) hypertension: Secondary | ICD-10-CM | POA: Diagnosis not present

## 2023-03-11 DIAGNOSIS — E876 Hypokalemia: Secondary | ICD-10-CM | POA: Diagnosis not present

## 2023-03-11 DIAGNOSIS — M5481 Occipital neuralgia: Secondary | ICD-10-CM | POA: Diagnosis not present

## 2023-03-11 DIAGNOSIS — R0789 Other chest pain: Secondary | ICD-10-CM

## 2023-03-11 NOTE — Patient Instructions (Signed)
BP check, and bring in home monitor and readings.   Check BP at home daily in the AM and write them down.
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
## 2023-03-11 NOTE — Addendum Note (Signed)
Addended by: Nani Gasser D on: 03/11/2023 01:13 PM   Modules accepted: Orders

## 2023-03-11 NOTE — Assessment & Plan Note (Signed)
Blood pressure is elevated today we discussed that it is important to make sure that this is well-controlled especially with some mild LV dysfunction on her echocardiogram.  She is willing to check blood pressures at home she has a pretty brand-new machine and bring it in in 1 week so that we can make sure that the machine is accurate in addition to evaluating this home blood pressure numbers and rechecking it here.

## 2023-03-19 ENCOUNTER — Ambulatory Visit (INDEPENDENT_AMBULATORY_CARE_PROVIDER_SITE_OTHER): Payer: Medicare HMO | Admitting: Family Medicine

## 2023-03-19 VITALS — BP 122/63 | HR 70 | Temp 98.1°F | Ht 60.0 in | Wt 119.0 lb

## 2023-03-19 DIAGNOSIS — E876 Hypokalemia: Secondary | ICD-10-CM | POA: Diagnosis not present

## 2023-03-19 DIAGNOSIS — I10 Essential (primary) hypertension: Secondary | ICD-10-CM

## 2023-03-19 DIAGNOSIS — R0789 Other chest pain: Secondary | ICD-10-CM | POA: Diagnosis not present

## 2023-03-19 NOTE — Progress Notes (Signed)
Patient is here for blood pressure check. Denies trouble sleeping, palpitations, dizziness, lightheadedness, blurry vision, chest pain, shortness of breath, headaches and/or medication problems.   Patient brought in their manual bp machine. Readings at home were:  8/25 125/60 8/26 118/56 8/29 (am) 144/90 8/29 (afternoon) 122/63   Patient informed to schedule next appointment in 3 months with their provider.

## 2023-03-20 LAB — BASIC METABOLIC PANEL
BUN/Creatinine Ratio: 26 (ref 12–28)
BUN: 21 mg/dL (ref 8–27)
CO2: 25 mmol/L (ref 20–29)
Calcium: 9.3 mg/dL (ref 8.7–10.3)
Chloride: 104 mmol/L (ref 96–106)
Creatinine, Ser: 0.81 mg/dL (ref 0.57–1.00)
Glucose: 87 mg/dL (ref 70–99)
Potassium: 4.1 mmol/L (ref 3.5–5.2)
Sodium: 141 mmol/L (ref 134–144)
eGFR: 72 mL/min/{1.73_m2} (ref >59)

## 2023-03-20 LAB — CBC WITH DIFFERENTIAL/PLATELET
Basophils Absolute: 0.1 10*3/uL (ref 0.0–0.2)
Basos: 1 %
EOS (ABSOLUTE): 0.1 10*3/uL (ref 0.0–0.4)
Eos: 2 %
Hematocrit: 38.4 % (ref 34.0–46.6)
Hemoglobin: 12.8 g/dL (ref 11.1–15.9)
Immature Grans (Abs): 0 10*3/uL (ref 0.0–0.1)
Immature Granulocytes: 0 %
Lymphocytes Absolute: 1.9 10*3/uL (ref 0.7–3.1)
Lymphs: 26 %
MCH: 30.5 pg (ref 26.6–33.0)
MCHC: 33.3 g/dL (ref 31.5–35.7)
MCV: 92 fL (ref 79–97)
Monocytes Absolute: 0.6 10*3/uL (ref 0.1–0.9)
Monocytes: 8 %
Neutrophils Absolute: 4.6 10*3/uL (ref 1.4–7.0)
Neutrophils: 63 %
Platelets: 228 10*3/uL (ref 150–450)
RBC: 4.19 x10E6/uL (ref 3.77–5.28)
RDW: 11.9 % (ref 11.7–15.4)
WBC: 7.3 10*3/uL (ref 3.4–10.8)

## 2023-03-20 NOTE — Progress Notes (Signed)
Verify her blood pressure machine against ours and it looks great and she is getting good numbers at home.  Continue to monitor for now.

## 2023-03-20 NOTE — Progress Notes (Signed)
Your lab work is within acceptable range and there are no concerning findings.   ?

## 2023-03-30 ENCOUNTER — Ambulatory Visit: Payer: Medicare HMO | Admitting: Physical Therapy

## 2023-04-06 ENCOUNTER — Ambulatory Visit
Admission: RE | Admit: 2023-04-06 | Discharge: 2023-04-06 | Disposition: A | Discharge status: Home / Self Care | Payer: Medicare HMO | Source: Ambulatory Visit | Attending: Family Medicine | Admitting: Family Medicine

## 2023-04-06 DIAGNOSIS — N644 Mastodynia: Secondary | ICD-10-CM | POA: Diagnosis not present

## 2023-04-06 DIAGNOSIS — Z853 Personal history of malignant neoplasm of breast: Secondary | ICD-10-CM

## 2023-04-06 DIAGNOSIS — N6311 Unspecified lump in the right breast, upper outer quadrant: Secondary | ICD-10-CM | POA: Diagnosis not present

## 2023-04-08 ENCOUNTER — Other Ambulatory Visit: Payer: Self-pay | Admitting: Family Medicine

## 2023-04-08 DIAGNOSIS — Z8673 Personal history of transient ischemic attack (TIA), and cerebral infarction without residual deficits: Secondary | ICD-10-CM

## 2023-04-09 ENCOUNTER — Other Ambulatory Visit (HOSPITAL_COMMUNITY): Payer: Medicare HMO

## 2023-05-22 ENCOUNTER — Emergency Department (HOSPITAL_BASED_OUTPATIENT_CLINIC_OR_DEPARTMENT_OTHER)
Admission: EM | Admit: 2023-05-22 | Discharge: 2023-05-22 | Disposition: A | Discharge status: Home / Self Care | Payer: Medicare HMO | Attending: Emergency Medicine | Admitting: Emergency Medicine

## 2023-05-22 ENCOUNTER — Emergency Department (HOSPITAL_BASED_OUTPATIENT_CLINIC_OR_DEPARTMENT_OTHER): Payer: Medicare HMO

## 2023-05-22 ENCOUNTER — Encounter (HOSPITAL_BASED_OUTPATIENT_CLINIC_OR_DEPARTMENT_OTHER): Payer: Self-pay | Admitting: Urology

## 2023-05-22 DIAGNOSIS — E039 Hypothyroidism, unspecified: Secondary | ICD-10-CM | POA: Insufficient documentation

## 2023-05-22 DIAGNOSIS — R2243 Localized swelling, mass and lump, lower limb, bilateral: Secondary | ICD-10-CM | POA: Diagnosis not present

## 2023-05-22 DIAGNOSIS — R61 Generalized hyperhidrosis: Secondary | ICD-10-CM | POA: Insufficient documentation

## 2023-05-22 DIAGNOSIS — R079 Chest pain, unspecified: Secondary | ICD-10-CM | POA: Diagnosis not present

## 2023-05-22 DIAGNOSIS — Z79899 Other long term (current) drug therapy: Secondary | ICD-10-CM | POA: Diagnosis not present

## 2023-05-22 DIAGNOSIS — F419 Anxiety disorder, unspecified: Secondary | ICD-10-CM | POA: Diagnosis not present

## 2023-05-22 DIAGNOSIS — Z7982 Long term (current) use of aspirin: Secondary | ICD-10-CM | POA: Diagnosis not present

## 2023-05-22 DIAGNOSIS — I10 Essential (primary) hypertension: Secondary | ICD-10-CM | POA: Insufficient documentation

## 2023-05-22 DIAGNOSIS — I2510 Atherosclerotic heart disease of native coronary artery without angina pectoris: Secondary | ICD-10-CM | POA: Diagnosis not present

## 2023-05-22 DIAGNOSIS — R0602 Shortness of breath: Secondary | ICD-10-CM | POA: Diagnosis not present

## 2023-05-22 DIAGNOSIS — R0789 Other chest pain: Secondary | ICD-10-CM | POA: Diagnosis not present

## 2023-05-22 LAB — HEPATIC FUNCTION PANEL
ALT: 16 U/L (ref 0–44)
AST: 27 U/L (ref 15–41)
Albumin: 3.9 g/dL (ref 3.5–5.0)
Alkaline Phosphatase: 77 U/L (ref 38–126)
Bilirubin, Direct: 0.1 mg/dL (ref 0.0–0.2)
Indirect Bilirubin: 0.9 mg/dL (ref 0.3–0.9)
Total Bilirubin: 1 mg/dL (ref 0.3–1.2)
Total Protein: 6.8 g/dL (ref 6.5–8.1)

## 2023-05-22 LAB — CBC
HCT: 40 % (ref 36.0–46.0)
Hemoglobin: 13.3 g/dL (ref 12.0–15.0)
MCH: 30.6 pg (ref 26.0–34.0)
MCHC: 33.3 g/dL (ref 30.0–36.0)
MCV: 92 fL (ref 80.0–100.0)
Platelets: 210 10*3/uL (ref 150–400)
RBC: 4.35 MIL/uL (ref 3.87–5.11)
RDW: 12.7 % (ref 11.5–15.5)
WBC: 8.9 10*3/uL (ref 4.0–10.5)
nRBC: 0 % (ref 0.0–0.2)

## 2023-05-22 LAB — TSH: TSH: 0.327 u[IU]/mL — ABNORMAL LOW (ref 0.350–4.500)

## 2023-05-22 LAB — TROPONIN I (HIGH SENSITIVITY)
Troponin I (High Sensitivity): 11 ng/L (ref <18)
Troponin I (High Sensitivity): 12 ng/L (ref <18)

## 2023-05-22 LAB — BASIC METABOLIC PANEL
Anion gap: 10 (ref 5–15)
BUN: 23 mg/dL (ref 8–23)
CO2: 27 mmol/L (ref 22–32)
Calcium: 8.7 mg/dL — ABNORMAL LOW (ref 8.9–10.3)
Chloride: 103 mmol/L (ref 98–111)
Creatinine, Ser: 0.83 mg/dL (ref 0.44–1.00)
GFR, Estimated: >60 mL/min (ref >60)
Glucose, Bld: 108 mg/dL — ABNORMAL HIGH (ref 70–99)
Potassium: 3.4 mmol/L — ABNORMAL LOW (ref 3.5–5.1)
Sodium: 140 mmol/L (ref 135–145)

## 2023-05-22 LAB — BRAIN NATRIURETIC PEPTIDE: B Natriuretic Peptide: 348.7 pg/mL — ABNORMAL HIGH (ref 0.0–100.0)

## 2023-05-22 LAB — LIPASE, BLOOD: Lipase: 34 U/L (ref 11–51)

## 2023-05-22 MED ORDER — FAMOTIDINE IN NACL 20-0.9 MG/50ML-% IV SOLN
20.0000 mg | Freq: Once | INTRAVENOUS | Status: AC
  Administered 2023-05-22: 20 mg via INTRAVENOUS
  Filled 2023-05-22: qty 50

## 2023-05-22 MED ORDER — ASPIRIN 81 MG PO CHEW
324.0000 mg | CHEWABLE_TABLET | Freq: Once | ORAL | Status: AC
  Administered 2023-05-22: 324 mg via ORAL
  Filled 2023-05-22: qty 4

## 2023-05-22 MED ORDER — ALUM & MAG HYDROXIDE-SIMETH 200-200-20 MG/5ML PO SUSP
30.0000 mL | Freq: Once | ORAL | Status: AC
  Administered 2023-05-22: 30 mL via ORAL
  Filled 2023-05-22: qty 30

## 2023-05-22 MED ORDER — LIDOCAINE VISCOUS HCL 2 % MT SOLN
15.0000 mL | Freq: Once | OROMUCOSAL | Status: AC
  Administered 2023-05-22: 15 mL via ORAL
  Filled 2023-05-22: qty 15

## 2023-05-22 NOTE — ED Notes (Signed)
X-ray at bedside

## 2023-05-22 NOTE — ED Triage Notes (Signed)
Central chest pain radiating to back that started approx 1 hr pta  H/o stent placed in 2023 Takes asa 81mg  daily, took today  SOB with pain noted

## 2023-05-22 NOTE — ED Provider Notes (Signed)
[place] [facility] Provider Note   CSN: [id number] Arrival date & time: 05/22/23  1351     History  Chief Complaint  Patient presents with   Chest Pain    Kathleen [name] is a 82 y.o. female.  The history is provided by the patient and medical records. No language interpreter was used.  Chest Pain Pain location:  Substernal area Pain quality: aching, crushing, pressure and tightness   Radiates to: "all over" Pain severity:  Severe Onset quality:  Sudden Timing:  Constant Progression:  Partially resolved Chronicity:  Recurrent Relieved by:  Nothing Worsened by:  Nothing Ineffective treatments:  None tried Associated symptoms: diaphoresis, lower extremity edema and shortness of breath   Associated symptoms: no abdominal pain, no back pain, no cough, no fatigue, no fever, no headache, no nausea, no near-syncope, no palpitations, no vomiting and no weakness   Risk factors: coronary artery disease        Home Medications Prior to Admission medications   Medication Sig Start Date End Date Taking? Authorizing Provider  aspirin EC 81 MG tablet Take 81 mg by mouth daily.    [provider]  atorvastatin (LIPITOR) 80 MG tablet TAKE ONE (1) TABLET BY MOUTH EVERY DAY 04/08/23   Agapito Games, MD  Cholecalciferol (VITAMIN D) 125 MCG (5000 UT) CAPS Take 5,000 Units by mouth daily.    [provider]  Ferrous Sulfate (IRON PO) Take 65 mg by mouth daily.    [provider]  levothyroxine (SYNTHROID) 75 MCG tablet TAKE ONE (1) TABLET BY MOUTH EACH DAY Patient taking differently: Take 75 mcg by mouth daily before breakfast. 11/10/22   Agapito Games, MD  Multiple Vitamins-Minerals (MULTIVITAMIN WITH MINERALS) tablet Take 1 tablet by mouth daily.    [provider]  TURMERIC PO Take 1 capsule by mouth daily.    [provider]  valACYclovir (VALTREX) 500 MG tablet TAKE ONE (1) TABLET BY MOUTH  EVERY DAY Patient taking differently: Take 500 mg by mouth daily. 11/10/22   Agapito Games, MD  Zinc Sulfate (ZINC 15 PO) Take 1 tablet by mouth daily.    [provider]      Allergies    Streptomycin, Amlodipine, Codeine, Elemental sulfur, Penicillins, Sulfa antibiotics, Sulfonamide derivatives, Sulfur, Benicar [olmesartan], Hctz [hydrochlorothiazide], and Lisinopril    Review of Systems   Review of Systems  Constitutional:  Positive for diaphoresis. Negative for chills, fatigue and fever.  HENT:  Negative for congestion.   Respiratory:  Positive for chest tightness and shortness of breath. Negative for cough and wheezing.   Cardiovascular:  Positive for chest pain and leg swelling. Negative for palpitations and near-syncope.  Gastrointestinal:  Negative for abdominal pain, constipation, diarrhea, nausea and vomiting.  Genitourinary:  Negative for flank pain.  Musculoskeletal:  Negative for back pain, neck pain and neck stiffness.  Skin:  Negative for rash and wound.  Neurological:  Negative for weakness, light-headedness and headaches.  Psychiatric/Behavioral:  Negative for agitation and confusion.   All other systems reviewed and are negative.   Physical Exam Updated Vital Signs BP (!) 169/72   Pulse 79   Temp 98.9 F (37.2 C) (Oral)   Resp 12   Ht 5' (1.524 m)   Wt 54 kg   SpO2 99%   BMI 23.25 kg/m  Physical Exam Vitals and nursing note reviewed.  Constitutional:      General: She is not in acute distress.  Appearance: She is well-developed. She is not ill-appearing, toxic-appearing or diaphoretic.  HENT:     Head: Normocephalic and atraumatic.  Eyes:     Conjunctiva/sclera: Conjunctivae normal.  Cardiovascular:     Rate and Rhythm: Normal rate and regular rhythm.     Heart sounds: Murmur heard.  Pulmonary:     Effort: Pulmonary effort is normal. No respiratory distress.     Breath sounds: Normal breath sounds.  Chest:     Chest wall: No  tenderness.  Abdominal:     Palpations: Abdomen is soft.     Tenderness: There is no abdominal tenderness.  Musculoskeletal:        General: No swelling.     Cervical back: Neck supple.     Right lower leg: No edema.     Left lower leg: No edema.  Skin:    General: Skin is warm and dry.     Capillary Refill: Capillary refill takes less than 2 seconds.     Findings: No erythema.  Neurological:     General: No focal deficit present.     Mental Status: She is alert.  Psychiatric:        Mood and Affect: Mood is anxious.     ED Results / Procedures / Treatments   Labs (all labs ordered are listed, but only abnormal results are displayed) Labs Reviewed  BASIC METABOLIC PANEL - Abnormal; Notable for the following components:      Result Value   Potassium 3.4 (*)    Glucose, Bld 108 (*)    Calcium 8.7 (*)    All other components within normal limits  CBC  HEPATIC FUNCTION PANEL  LIPASE, BLOOD  BRAIN NATRIURETIC PEPTIDE  TSH  TROPONIN I (HIGH SENSITIVITY)    EKG EKG Interpretation Date/Time:  Friday May 22 2023 13:54:07 EDT Ventricular Rate:  96 PR Interval:  189 QRS Duration:  87 QT Interval:  346 QTC Calculation: 438 R Axis:   20  Text Interpretation: Sinus tachycardia Ventricular trigeminy Anteroseptal infarct, age indeterminate when compared to prior, similar appearance with more PVCs. No STEMI Confirmed by Theda Belfast ([id number]) on 05/22/2023 1:57:32 PM  Radiology No results found.  Procedures Procedures    Medications Ordered in ED Medications  alum & mag hydroxide-simeth (MAALOX/MYLANTA) 200-200-20 MG/5ML suspension 30 mL (30 mLs Oral Given 05/22/23 1422)    And  lidocaine (XYLOCAINE) 2 % viscous mouth solution 15 mL (15 mLs Oral Given 05/22/23 1422)  aspirin chewable tablet 324 mg (324 mg Oral Given 05/22/23 1422)    ED Course/ Medical Decision Making/ A&P                                 Medical Decision Making Amount and/or Complexity of Data  Reviewed Labs: ordered. Radiology: ordered.  Risk OTC drugs. Prescription drug management.    Kathleen [name] is a 82 y.o. female with a past medical history significant for hypothyroidism, hypertension, CAD with previous PCI, mitral valve prolapse, kidney stones, and previous stroke who presents with chest pain.  According to patient, about 1 hour prior to arrival patient had sudden onset of chest pressure and chest pain similar to what she had to admitted for several months ago.  She reports that at lunch she had some possible on oh flavored chicken salad and after a bite started having some coughing and burning sensation in her mouth.  She then switched to some soup  and tolerated it well.  She reports that she went shopping in the store having the crushing chest pain.  She reports she was diaphoretic and short of breath.  She reports the pain went all over her torso as it did in the past.  She reports no trauma.  Denies any preceding fevers, chills, congestion, or cough.  Denies any nausea or vomiting with it and denies any constipation, diarrhea, or urinary changes.  She does report some intermittent swelling in both ankles recently but otherwise denies heart failure.  She was told if she ever had symptoms like this again to go to the emergency department for evaluation.  Patient reports the pain has improved and is now very mild.  Reports the symptoms are not pleuritic at this time.  On exam, lungs clear.  She does have a slight murmur.  Chest nontender.  Abdomen nontender.  Good pulses in extremities.  Legs have no edema on my exam although patient says she has had some intermittent ankle swelling.  Back nontender.  Patient is anxious and jittery.  EKG appears similar to prior with PVCs but no STEMI seen.  Given patient's history of previous MI with her chest pain with diaphoresis and shortness of breath anticipate discussion with cardiology after troponins are obtained.  Patient says this feels  very similar to how she had gotten admitted several months ago when she had a negative workup otherwise.  At that time she had CT imaging that did not show dissection or blood clots.  Will hold on CT imaging at this time but will get delta troponin, labs, and due to the symptoms she had that began with her [place] no pepper food, will give a GI cocktail to see if this helps.  Will give some aspirin she has not had aspirin today.  Anticipate reassessment after workup is completed and will have a shared decision-making conversation about discussion with cardiology.  Care transferred to oncoming team to wait for delta troponin and reassessment.           Final Clinical Impression(s) / ED Diagnoses Final diagnoses:  Nonspecific chest pain    Clinical Impression: 1. Nonspecific chest pain     Disposition: Admit  This note was prepared with assistance of Dragon voice recognition software. Occasional wrong-word or sound-a-like substitutions may have occurred due to the inherent limitations of voice recognition software.      Nava Song, Canary Brim, MD 05/22/23 [phone]

## 2023-05-22 NOTE — ED Notes (Signed)
D/c paperwork reviewed with pt, including follow up care.  No questions or concerns voiced at time of d/c. Marland Kitchen Pt verbalized understanding, Ambulatory with family to ED exit, NAD.

## 2023-05-22 NOTE — ED Notes (Signed)
Lab notified to add-on BNP, hepatic function panel, Lipase to previously collected blood  sample.

## 2023-05-22 NOTE — ED Provider Notes (Signed)
  Physical Exam  BP (!) 156/88   Pulse 70   Temp 98.9 F (37.2 C) (Oral)   Resp 20   Ht 5' (1.524 m)   Wt 54 kg   SpO2 98%   BMI 23.25 kg/m   Physical Exam  Procedures  Procedures  ED Course / MDM    Medical Decision Making Care assumed at 3 PM.  Patient has a history of coronary artery stent here presenting with chest pain.  Patient is chest pain free on arrival.  Patient had recent admission for chest pain and was seen by cardiology and was thought to have noncardiac chest pain at that time.  Signout pending second troponin  5:18 PM Reviewed patient's labs and second troponin is negative.  Chest x-ray unremarkable.  Patient is feeling well.  At this point she can follow-up with Dr. Excell Seltzer outpatient  Problems Addressed: Nonspecific chest pain: acute illness or injury  Amount and/or Complexity of Data Reviewed Labs: ordered. Radiology: ordered.  Risk OTC drugs. Prescription drug management.          Charlynne Pander, MD 05/22/23 [phone]

## 2023-05-22 NOTE — Discharge Instructions (Signed)
As we discussed, your heart enzyme tests are normal today.  Please follow-up with Dr. Excell Seltzer  Return to ER if you have worse chest pain or shortness of breath

## 2023-06-03 ENCOUNTER — Telehealth: Payer: Self-pay | Admitting: Cardiovascular Disease

## 2023-06-03 NOTE — Telephone Encounter (Signed)
Patient is requesting to speak directly with Dr. Earmon Phoenix nurse, Maralyn Sago, if possible. She states she would like to discuss her insurance info for next year. I confirmed with her that her plan will still be in network as far as I know, but she requests a call directly from RN if possible.

## 2023-06-03 NOTE — Telephone Encounter (Signed)
Spoke with patient and she wants to speak to Nurse Maralyn Sago. She is aware Maralyn Sago is out until tomorrow

## 2023-06-04 NOTE — Telephone Encounter (Signed)
Explained to patient that I would need to ask someone in billing and I'll get back to her.

## 2023-06-04 NOTE — Telephone Encounter (Signed)
Returned call to patient. She states she got a call to review her insurance and was told that Dr Excell Seltzer will not be in-network for her next year if she switches. She's unsure of the name of the plan or if it's too late to change her choice of plan. Advised that for him to not be covered would mean that no physician in our practice would be, as it's based on the practice, not provider. She is going to find out the name of the plan and I told her I would try to contact billing to find out information for her.

## 2023-06-04 NOTE — Telephone Encounter (Signed)
Patient calling back to say it was [employer]. Please advise

## 2023-06-08 NOTE — Telephone Encounter (Signed)
Good Morning, please provide the patient our tax id# [id number] and NPI [id number]. They can verfiy if [facility] is in the [facility] network by providing this information. If [facility] is participating, ALL of our providers are in their network.

## 2023-06-08 NOTE — Telephone Encounter (Signed)
Returned call to patient and gave this information. She states she will call [employer] and have them verify coverage. Appreciative of call.

## 2023-06-22 ENCOUNTER — Ambulatory Visit (INDEPENDENT_AMBULATORY_CARE_PROVIDER_SITE_OTHER): Payer: Medicare HMO | Admitting: Family Medicine

## 2023-06-22 ENCOUNTER — Ambulatory Visit: Payer: Medicare HMO

## 2023-06-22 ENCOUNTER — Telehealth: Payer: Self-pay | Admitting: Family Medicine

## 2023-06-22 ENCOUNTER — Encounter: Payer: Self-pay | Admitting: Family Medicine

## 2023-06-22 VITALS — BP 168/89 | HR 79 | Ht 60.0 in | Wt 118.0 lb

## 2023-06-22 DIAGNOSIS — R931 Abnormal findings on diagnostic imaging of heart and coronary circulation: Secondary | ICD-10-CM

## 2023-06-22 DIAGNOSIS — R7989 Other specified abnormal findings of blood chemistry: Secondary | ICD-10-CM | POA: Diagnosis not present

## 2023-06-22 DIAGNOSIS — R29898 Other symptoms and signs involving the musculoskeletal system: Secondary | ICD-10-CM | POA: Diagnosis not present

## 2023-06-22 DIAGNOSIS — R9430 Abnormal result of cardiovascular function study, unspecified: Secondary | ICD-10-CM

## 2023-06-22 DIAGNOSIS — E039 Hypothyroidism, unspecified: Secondary | ICD-10-CM

## 2023-06-22 DIAGNOSIS — I10 Essential (primary) hypertension: Secondary | ICD-10-CM

## 2023-06-22 DIAGNOSIS — M419 Scoliosis, unspecified: Secondary | ICD-10-CM | POA: Diagnosis not present

## 2023-06-22 DIAGNOSIS — M5459 Other low back pain: Secondary | ICD-10-CM

## 2023-06-22 DIAGNOSIS — M5136 Other intervertebral disc degeneration, lumbar region with discogenic back pain only: Secondary | ICD-10-CM | POA: Diagnosis not present

## 2023-06-22 DIAGNOSIS — M48061 Spinal stenosis, lumbar region without neurogenic claudication: Secondary | ICD-10-CM | POA: Diagnosis not present

## 2023-06-22 DIAGNOSIS — M4310 Spondylolisthesis, site unspecified: Secondary | ICD-10-CM | POA: Diagnosis not present

## 2023-06-22 MED ORDER — LOSARTAN POTASSIUM 25 MG PO TABS: 25.0000 mg | ORAL_TABLET | Freq: Every day | ORAL | 1 refills | Status: DC

## 2023-06-22 NOTE — Progress Notes (Signed)
Established Patient Office Visit  Subjective   Patient ID: Kathleen [name], female    DOB: 1941/07/16  Age: 82 y.o. MRN: [id number]  Chief Complaint  Patient presents with   Extremity Weakness    Bilateral leg weakness ongoing since fall in 2023.    Fatigue    Has no energy    HPI  Seen ED for CP on 11/1.  Hx of CA stent.  She was dx with Anxiety.  He also went to the emergency room for chest pain back in August.  With time she says it felt like a heaviness across her chest and upper abdomen and radiating into her back.  She is also still struggling with that left leg feeling weak at times.  So for example the other day she tried to bend down to get a bowl out of the cabinet and literally could not get back up using her left leg because it felt so weak.  It has been like that since her fall back in 2023.  Last xray of lumbar spine 2022 showed:  Narrative & Impression  CLINICAL DATA:  Lower back pain   EXAM: LUMBAR SPINE - COMPLETE 4+ VIEW   COMPARISON:  MRI lumbar spine 01/19/2017   FINDINGS: Levoconvex lumbar curvature. There is mild to moderate multilevel degenerative disc disease, worst at L2-L3, L3-L4, and L5-S1. There is moderate multilevel facet arthropathy, left greater than right. There is grade 1 anterolisthesis at L4-L5. Trace anterolisthesis at L3-L4. No evidence of lumbar spine fracture.   IMPRESSION: Levoconvex lumbar curvature.   Mild to moderate multilevel degenerative disc disease and moderate multilevel facet arthropathy.   Grade 1 anterolisthesis at L4-L5.  Trace anterolisthesis at L3-L4.     Has just been very stressful.  She and her husband have let her granddaughter stay with her but she is really not helping them around the house and is not keeping her area clean.  And Kathleen [name] is having to provide all of her transportation to and from work.  Hypothyroidism - Taking medication regularly in the AM away from food and vitamins, etc. No recent change to  skin, hair, or energy levels.  In recent ED visit her BNP was elevated.  Like to recheck that today.  Echocardiogram from August showed mild LV dysfunction with akinesis of the distal septum and apex.  Ejection fraction was 45 to 50% with mildly decreased function.  She also had a small pericardial effusion.    ROS    Objective:     BP (!) 168/89 Comment: Repeat BP  Pulse 79   Ht 5' (1.524 m)   Wt 118 lb (53.5 kg)   SpO2 100%   BMI 23.05 kg/m    Physical Exam Vitals reviewed.  Constitutional:      Appearance: Normal appearance.  HENT:     Head: Normocephalic.  Pulmonary:     Effort: Pulmonary effort is normal.  Musculoskeletal:     Comments: Left hip with 4 out of 5 flexion, left knee 4 out of 5 extension, knee flexion 5 out of 5.  Compared to 5 out of 5 with all motions on the right leg.  Neurological:     Mental Status: She is alert and oriented to person, place, and time.  Psychiatric:        Mood and Affect: Mood normal.        Behavior: Behavior normal.      No results found for any visits on 06/22/23.    The  ASCVD Risk score (Arnett DK, et al., 2019) failed to calculate for the following reasons:   The 2019 ASCVD risk score is only valid for ages 71 to 30   The patient has a prior MI or stroke diagnosis    Assessment & Plan:   Problem List Items Addressed This Visit       Cardiovascular and Mediastinum   Essential hypertension    Pressure is elevated we will recheck before she goes today.      Relevant Medications   losartan (COZAAR) 25 MG tablet   Other Relevant Orders   B Nat Peptide     Endocrine   Hypothyroidism    Due to recheck TSH.        Relevant Orders   TSH   Other Visit Diagnoses     Left leg weakness    -  Primary   Relevant Orders   DG Lumbar Spine Complete   Elevated brain natriuretic peptide (BNP) level       Relevant Orders   B Nat Peptide   Abnormal echocardiogram       Relevant Medications   losartan (COZAAR) 25  MG tablet   Ejection fraction < 50%       Relevant Medications   losartan (COZAAR) 25 MG tablet      Leg weakness-occurred after her initial fall back in 2023-she has never had films of her low back I am concerned that she may have some neuropathy that might actually be affecting her muscle strength.  She did look into doing physical therapy as well for that left leg but co-pay was good to be quite high so she is interested in looking outside of our rehab center to see if it might be cheaper in someone who is able to offer aquatic therapy she is gena look in [place] where she lives and then let us know.   I spent 45 minutes on the day of the encounter to include pre-visit record review, face-to-face time with the patient and post visit ordering of test.  No follow-ups on file.    Nani Gasser, MD

## 2023-06-22 NOTE — Assessment & Plan Note (Signed)
Due to recheck TSH. 

## 2023-06-22 NOTE — Telephone Encounter (Signed)
Call pt: we really need to put her back on a low dose BP med for her heart. I looked at her lst echo from [place] and her BP from last several visits.   Will send in a very low dose of losartan.

## 2023-06-22 NOTE — Assessment & Plan Note (Signed)
Pressure is elevated we will recheck before she goes today.

## 2023-06-23 LAB — BRAIN NATRIURETIC PEPTIDE: BNP: 252.6 pg/mL — ABNORMAL HIGH (ref 0.0–100.0)

## 2023-06-23 LAB — TSH: TSH: 0.318 u[IU]/mL — ABNORMAL LOW (ref 0.450–4.500)

## 2023-06-23 NOTE — Telephone Encounter (Signed)
Okay, if she is just taking it 6 days a week (skipping one day per week) , then change to whole tab 5 days per week, 1/2 tab one day per week and no pill one day per week  Then plan to recheck in 6-8 weeks.

## 2023-06-23 NOTE — Progress Notes (Signed)
Hi Kathleen [name], your thyroid level is off a little bit it is similar to what it has been over the last 4 months but I feel like we just need to go ahead and make an adjustment.  Please verify how you are taking the medication.  Are you taking a whole tab every day?

## 2023-06-23 NOTE — Telephone Encounter (Signed)
Patient has been notified of low dose losartan sent to the pharmacy.   Patient mentioned during the call that she is currently taking Levothyroxine 75 mcg, Monday through Saturday / 1 tab. Does provider want to make an adjustment to her current regimen? Please advise, thanks.

## 2023-06-23 NOTE — Telephone Encounter (Signed)
Task completed. Patient has been updated of new regimen for Levothyroxine 75 mcg. Patient verbalized understanding. She is aware that thyroid labs will need to be completed 6 - 8 weeks for medication efficacy. No other inquires during the call.

## 2023-06-23 NOTE — Progress Notes (Signed)
Hi Asiah, the second blood test that I ordered is called a BNP.  This was elevated which can indicate that there is a little bit of volume overload that is causing a little extra stress on your heart.  Do think putting you on a low-dose diuretic for a couple of days would be helpful.  Would you be okay with this?

## 2023-06-27 ENCOUNTER — Other Ambulatory Visit: Payer: Self-pay | Admitting: Orthopaedic Surgery

## 2023-07-01 ENCOUNTER — Telehealth: Payer: Self-pay | Admitting: Cardiovascular Disease

## 2023-07-01 ENCOUNTER — Ambulatory Visit: Payer: Medicare HMO | Admitting: Cardiovascular Disease

## 2023-07-01 ENCOUNTER — Telehealth: Payer: Self-pay | Admitting: Family Medicine

## 2023-07-01 MED ORDER — FUROSEMIDE 20 MG PO TABS: 20.0000 mg | ORAL_TABLET | Freq: Every day | ORAL | 1 refills | Status: DC | PRN

## 2023-07-01 MED ORDER — LEVOTHYROXINE SODIUM 50 MCG PO TABS: 50.0000 ug | ORAL_TABLET | Freq: Every day | ORAL | 1 refills | Status: DC

## 2023-07-01 NOTE — Progress Notes (Signed)
Change thyroid pill to once a day. New rx sent. Recheck TSH in 8 weeks.  Diuretic sent to take one in AM for 2 days and see if chest pressure feels better.  Then can take as needed.

## 2023-07-01 NOTE — Progress Notes (Signed)
OK ignore script for furosemide. I saw this message later.

## 2023-07-01 NOTE — Telephone Encounter (Signed)
Kathleen [name], patient returned your call regarding the below mentioned:  Hi [name], the second blood test that I ordered is called a BNP.  This was elevated which can indicate that there is a little bit of volume overload that is causing a little extra stress on your heart.  Do think putting you on a low-dose diuretic for a couple of days would be helpful.  Would you be okay with this?

## 2023-07-01 NOTE — Telephone Encounter (Signed)
Per Pt her PCP is wanting to put her on a fluid pill and she wants to talk to [place] before doing that. Please advise

## 2023-07-01 NOTE — Addendum Note (Signed)
Addended by: Nani Gasser D on: 07/01/2023 08:18 PM   Modules accepted: Orders

## 2023-07-02 NOTE — Telephone Encounter (Signed)
Patient called back about about previous message to Dr. Excell Seltzer or nurse

## 2023-07-02 NOTE — Telephone Encounter (Signed)
Spoke with patient and she states her PCP would like to start her on a fluid pill. They did blood work and stated she was in fluid overload,   She would like Dr. Earmon Phoenix opinion.

## 2023-07-02 NOTE — Telephone Encounter (Signed)
Spoke with patient.

## 2023-07-02 NOTE — Telephone Encounter (Signed)
I'm ok with her trying a low dose diuretic if that's what was prescribed. thx

## 2023-07-03 NOTE — Telephone Encounter (Signed)
Reached out to patient, but no answer. Advised of Dr Earmon Phoenix thoughts and that she can call back if she would like to provide specific drug information.

## 2023-07-06 NOTE — Progress Notes (Signed)
Vessica, there is moderate curvature and scoliosis of the lumbar spine unchanged from prior exam they also saw some diffuse degeneration of the disks and some arthritis in the facets which is the hinge part of the spine.  Neck step would be to consider an MRI if you are open to it.

## 2023-07-07 ENCOUNTER — Telehealth: Payer: Self-pay

## 2023-07-07 MED ORDER — POTASSIUM CHLORIDE CRYS ER 10 MEQ PO TBCR: 10.0000 meq | EXTENDED_RELEASE_TABLET | Freq: Every day | ORAL | 1 refills | Status: AC | PRN

## 2023-07-07 NOTE — Telephone Encounter (Signed)
Task completed. Patient will pick up the potassium from the pharmacy. Patient aware to contact the clinic if she continues to feel the chest pressure or have worsening symptoms. Patient verbalized understanding and was agreeable with plan.

## 2023-07-07 NOTE — Telephone Encounter (Signed)
Contacted the patient - she is planning to start the Furosemide rx today. Patient wants to know if provider recommends her to take a potassium supplementation along with the medication. Please advise, thanks.

## 2023-07-07 NOTE — Telephone Encounter (Signed)
Typically if just using for a few days then we do not necessarily do potassium but I will go ahead and send some over.  She can just take 1 anytime she takes the Lasix/furosemide.

## 2023-07-07 NOTE — Telephone Encounter (Signed)
Copied from CRM [id number]. Topic: Clinical - Medication Question >> Jul 03, 2023  4:59 PM Alvino Blood C wrote: Reason for CRM: PT is calling to confirm her doctor (Dr. Excell Seltzer) stated as long as its a low dosage medication, it would be ok for her. The medication is used to reduce fluid around her heart.

## 2023-07-09 NOTE — Telephone Encounter (Signed)
Attempted to follow-up with patient again, but again no answer.

## 2023-08-06 ENCOUNTER — Encounter: Payer: Self-pay | Admitting: Family Medicine

## 2023-08-06 ENCOUNTER — Ambulatory Visit: Payer: HMO | Admitting: Family Medicine

## 2023-08-06 VITALS — BP 126/76 | HR 81 | Ht 60.0 in | Wt 118.0 lb

## 2023-08-06 DIAGNOSIS — R7301 Impaired fasting glucose: Secondary | ICD-10-CM

## 2023-08-06 DIAGNOSIS — I10 Essential (primary) hypertension: Secondary | ICD-10-CM | POA: Diagnosis not present

## 2023-08-06 DIAGNOSIS — I5022 Chronic systolic (congestive) heart failure: Secondary | ICD-10-CM | POA: Diagnosis not present

## 2023-08-06 DIAGNOSIS — E039 Hypothyroidism, unspecified: Secondary | ICD-10-CM

## 2023-08-06 DIAGNOSIS — F439 Reaction to severe stress, unspecified: Secondary | ICD-10-CM | POA: Diagnosis not present

## 2023-08-06 DIAGNOSIS — R9430 Abnormal result of cardiovascular function study, unspecified: Secondary | ICD-10-CM | POA: Diagnosis not present

## 2023-08-06 DIAGNOSIS — R931 Abnormal findings on diagnostic imaging of heart and coronary circulation: Secondary | ICD-10-CM | POA: Diagnosis not present

## 2023-08-06 LAB — POCT GLYCOSYLATED HEMOGLOBIN (HGB A1C): Hemoglobin A1C: 5.9 % — AB (ref 4.0–5.6)

## 2023-08-06 MED ORDER — VALACYCLOVIR HCL 500 MG PO TABS: ORAL_TABLET | ORAL | 3 refills | Status: DC

## 2023-08-06 MED ORDER — LOSARTAN POTASSIUM 25 MG PO TABS: 25.0000 mg | ORAL_TABLET | Freq: Every day | ORAL | 1 refills | Status: DC

## 2023-08-06 NOTE — Assessment & Plan Note (Signed)
Well controlled. Continue current regimen. Follow up in  6 mo  

## 2023-08-06 NOTE — Progress Notes (Signed)
Established Patient Office Visit  Subjective  Patient ID: Kathleen [name], female    DOB: 07-28-1940  Age: 83 y.o. MRN: [id number]  Chief Complaint  Patient presents with   Hypertension      HPI  Follow-up chest pressure and blood pressure when I last saw her she was having some chest discomfort and her blood pressure was elevated.  Though she does tend to have an element of whitecoat hypertension.  We did do some labs and her BNP was elevated sent in a prescription for a furosemide to take for her a couple of days to try to pull some volume off to see if she felt better.  She is here today for 4-week follow-up  Hypothyroidism we did adjust her thyroid medication at last office visit her TSH has been under 1 for the last year.  She was reduced to 50 mcg daily but says she has been skipping Sundays.  Been having some pain around her left shoulder and left axilla after cleaning the baseboards she woke up with it the next day.  Still doesn't have heat in her house. They are using heaters.  She is very worried about her income since she is not longer able to work  Looking at [employer] at a [employer] in [place].     ROS    Objective:     BP 126/76   Pulse 81   Ht 5' (1.524 m)   Wt 118 lb (53.5 kg)   SpO2 100%   BMI 23.05 kg/m    Physical Exam Vitals and nursing note reviewed.  Constitutional:      Appearance: Normal appearance.  HENT:     Head: Normocephalic and atraumatic.  Eyes:     Conjunctiva/sclera: Conjunctivae normal.  Cardiovascular:     Rate and Rhythm: Normal rate and regular rhythm.  Pulmonary:     Effort: Pulmonary effort is normal.     Breath sounds: Normal breath sounds.  Skin:    General: Skin is warm and dry.  Neurological:     Mental Status: She is alert.  Psychiatric:        Mood and Affect: Mood normal.      Results for orders placed or performed in visit on 08/06/23  POCT HgB A1C  Result Value Ref Range   Hemoglobin A1C 5.9 (A) 4.0 - 5.6  %   HbA1c POC (<> result, manual entry)     HbA1c, POC (prediabetic range)     HbA1c, POC (controlled diabetic range)        The ASCVD Risk score (Arnett DK, et al., 2019) failed to calculate for the following reasons:   The 2019 ASCVD risk score is only valid for ages 21 to 40   Risk score cannot be calculated because patient has a medical history suggesting prior/existing ASCVD    Assessment & Plan:   Problem List Items Addressed This Visit       Cardiovascular and Mediastinum   Heart failure with mildly reduced ejection fraction (HFmrEF) (HCC)   She says she is taking lasix once a week when her ankle look a little more swollen.  She has f/u with Cards at the end of the months and they can take a look to make any adjustments needed..       Relevant Medications   losartan (COZAAR) 25 MG tablet   Essential hypertension - Primary   Well controlled. Continue current regimen. Follow up in  12mo  Relevant Medications   losartan (COZAAR) 25 MG tablet     Endocrine   Impaired fasting glucose   Well controlled. Continue current regimen. Follow up in  24mo       Relevant Orders   POCT HgB A1C (Completed)   Hypothyroidism   Relevant Orders   TSH     Other   Stress at home   She is in a quite difficult situation at home with her husband.  They are financially strapped and so he does not want to ask for help or money to help her.  There are heat so they have just been relying on heaters in the home this particularly cold winter.  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  But this is causing a lot of stress for her.  She also has a granddaughter who is living with them and that creates a lot of stress that she is not helpful for them.      Other Visit Diagnoses       Abnormal echocardiogram       Relevant Medications   losartan (COZAAR) 25 MG tablet     Ejection fraction < 50%       Relevant Medications   losartan (COZAAR) 25 MG tablet        Return in about 2 months (around 10/04/2023) for Hypertension and thyroid .    I spent 42 minutes on the day of the encounter to include pre-visit record review, face-to-face time with the patient and post visit ordering of test.   Nani Gasser, MD

## 2023-08-06 NOTE — Patient Instructions (Signed)
Please take 50 mcg of your thyroid pill daily.  Do not skip Sundays.  Will plan to recheck your level in about 4 weeks.  I am going to go ahead and order your blood work today so that you can come at your convenience to have that drawn.

## 2023-08-06 NOTE — Assessment & Plan Note (Addendum)
She says she is taking lasix once a week when her ankle look a little more swollen.  She has f/u with Cards at the end of the months and they can take a look to make any adjustments needed.Marland Kitchen

## 2023-08-07 DIAGNOSIS — F439 Reaction to severe stress, unspecified: Secondary | ICD-10-CM | POA: Insufficient documentation

## 2023-08-07 NOTE — Assessment & Plan Note (Addendum)
She is in a quite difficult situation at home with her husband.  They are financially strapped and so he does not want to ask for help or money to help her.  There are heat so they have just been relying on heaters in the home this particularly cold winter.  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  But this is causing a lot of stress for her.  She also has a granddaughter who is living with them and that creates a lot of stress that she is not helpful for them.  I have encouraged her several times to consider talking with somebody.

## 2023-08-13 ENCOUNTER — Ambulatory Visit: Payer: HMO | Attending: Cardiovascular Disease | Admitting: Cardiovascular Disease

## 2023-08-13 ENCOUNTER — Encounter: Payer: Self-pay | Admitting: Cardiovascular Disease

## 2023-08-13 VITALS — BP 130/78 | HR 85 | Ht 60.0 in | Wt 125.6 lb

## 2023-08-13 DIAGNOSIS — E785 Hyperlipidemia, unspecified: Secondary | ICD-10-CM | POA: Diagnosis not present

## 2023-08-13 DIAGNOSIS — I3139 Other pericardial effusion (noninflammatory): Secondary | ICD-10-CM | POA: Diagnosis not present

## 2023-08-13 DIAGNOSIS — I10 Essential (primary) hypertension: Secondary | ICD-10-CM

## 2023-08-13 DIAGNOSIS — I2510 Atherosclerotic heart disease of native coronary artery without angina pectoris: Secondary | ICD-10-CM | POA: Diagnosis not present

## 2023-08-13 NOTE — Patient Instructions (Addendum)
Follow-Up: At [facility], you and your health needs are our priority.  As part of our continuing mission to provide you with exceptional heart care, we have created designated Provider Care Teams.  These Care Teams include your primary Cardiologist (physician) and Advanced Practice Providers (APPs -  Physician Assistants and Nurse Practitioners) who all work together to provide you with the care you need, when you need it.  We recommend signing up for the patient portal called "MyChart".  Sign up information is provided on this After Visit Summary.  MyChart is used to connect with patients for Virtual Visits (Telemedicine).  Patients are able to view lab/test results, encounter notes, upcoming appointments, etc.  Non-urgent messages can be sent to your provider as well.   To learn more about what you can do with MyChart, go to [email or web address].    Your next appointment:   6 month(s)  Provider:   Tonny Bollman, MD     Other Instructions   1st Floor: - Lobby - Registration  - Pharmacy  - Lab - Cafe  2nd Floor: - PV Lab - Diagnostic Testing (echo, CT, nuclear med)  3rd Floor: - Vacant  4th Floor: - TCTS (cardiothoracic surgery) - AFib Clinic - Structural Heart Clinic - Vascular Surgery  - Vascular Ultrasound  5th Floor: - [facility] (general and EP) - Clinical Pharmacy for coumadin, hypertension, lipid, weight-loss medications, and med management appointments    Valet parking services will be available as well.

## 2023-08-13 NOTE — Progress Notes (Signed)
Cardiology Office Note:    Date:  08/20/2023   ID:  Kathleen [name], DOB May 09, 1941, MRN [id number]  PCP:  Agapito Games, MD   [facility] Providers Cardiologist:  Tonny Bollman, MD     Referring MD: Agapito Games, *   Chief Complaint  Patient presents with   Coronary Artery Disease    History of Present Illness:    Kathleen [name] is a 83 y.o. female presenting for follow-up evaluation. She has a history of coronary artery disease, pericardial effusion, and mixed hyperlipidemia, presenting for follow-up evaluation. She has had a moderate pericardial effusion and has not required intervention. LVEF has been reduced at 40 to 45% with hypokinesis of the apical anterior wall. She was found to have severe proximal LAD stenosis by coronary CTA in July 2023 and because of symptoms of marked fatigue and mild exertional dyspnea, was ultimately referred for cardiac catheterization and treated with a DES in the LAD.   Today, she denies symptoms of palpitations, chest pain, shortness of breath, orthopnea, PND, lower extremity edema, dizziness, or syncope. She has generalized fatigue. Overall reports no change in her health status since last clinic visit here.    Current Medications: Current Meds  Medication Sig   aspirin EC 81 MG tablet Take 81 mg by mouth at bedtime.   atorvastatin (LIPITOR) 80 MG tablet TAKE ONE (1) TABLET BY MOUTH EVERY DAY (Patient taking differently: Take 40 mg by mouth at bedtime.)   Cholecalciferol (VITAMIN D) 125 MCG (5000 UT) CAPS Take 5,000 Units by mouth daily.   Ferrous Sulfate (IRON PO) Take 65 mg by mouth daily.   furosemide (LASIX) 20 MG tablet Take 1 tablet (20 mg total) by mouth daily as needed.   ibuprofen (ADVIL) 800 MG tablet TAKE ONE TABLET BY MOUTH  EVERY 8 HOURS AS NEEDED   levothyroxine (SYNTHROID) 50 MCG tablet Take 1 tablet (50 mcg total) by mouth daily before breakfast. TAKE ONE (1) TABLET BY MOUTH EACH DAY   losartan (COZAAR) 25  MG tablet Take 1 tablet (25 mg total) by mouth daily.   MAGNESIUM PO Take 1 tablet by mouth daily.   OVER THE COUNTER MEDICATION Take 2 capsules by mouth 3 (three) times daily. Balance of Nature capsules (vegetables and fruits)   potassium chloride (KLOR-CON M) 10 MEQ tablet Take 1 tablet (10 mEq total) by mouth daily as needed.   TURMERIC PO Take 1 capsule by mouth daily.   valACYclovir (VALTREX) 500 MG tablet TAKE ONE (1) TABLET BY MOUTH EVERY DAY   Zinc Sulfate (ZINC 15 PO) Take 1 tablet by mouth daily.     Allergies:   Streptomycin, Amlodipine, Codeine, Elemental sulfur, Penicillins, Sulfa antibiotics, Sulfonamide derivatives, Sulfur, Benicar [olmesartan], Hctz [hydrochlorothiazide], and Lisinopril   ROS:   Please see the history of present illness.    All other systems reviewed and are negative.  EKGs/Labs/Other Studies Reviewed:    The following studies were reviewed today: Cardiac Studies & Procedures   CARDIAC CATHETERIZATION  CARDIAC CATHETERIZATION 02/10/2022  Narrative   Ramus lesion is 40% stenosed.   Prox LAD to Mid LAD lesion is 95% stenosed.   Mid LAD to Dist LAD lesion is 50% stenosed.   A drug-eluting stent was successfully placed using a SYNERGY XD 2.50X32.   Post intervention, there is a 0% residual stenosis.  1.  Severe proximal LAD stenosis, treated successfully with PCI using a 2.5 x 32 mm Synergy DES 2.  Widely patent, dominant left circumflex  with no significant stenosis 3.  Patent, nondominant RCA (small vessel) 4.  Essentially normal right heart hemodynamics, normal LVEDP, normal wedge pressure, with no hemodynamic evidence of cardiac tamponade  Recommendations: Aspirin and clopidogrel at least 6 months, aggressive risk reduction measures, overnight observation with plans for discharge tomorrow as long as no complications arise.  Findings Coronary Findings Diagnostic  Dominance: Left  Left Anterior Descending Prox LAD to Mid LAD lesion is 95%  stenosed. The lesion is moderately calcified. The LAD has a tight proximal stenosis of 95%.  The remainder of the vessel is underfilled and diffusely diseased.  The diagonal branches are small in caliber. Mid LAD to Dist LAD lesion is 50% stenosed. Diffuse mid to distal LAD stenosis is present  Ramus Intermedius Ramus lesion is 40% stenosed. The ramus branch covers a fairly large territory.  There is mild nonobstructive plaquing in the proximal vessel at the origin of a small perforating branch.  The perforating branch has an 80% ostial stenosis  Left Circumflex Vessel is large. The vessel exhibits minimal luminal irregularities. Large vessel, mild nonobstructive plaquing noted with no significant stenosis.  The branch vessels of the circumflex are patent.  The circumflex is dominant.  Right Coronary Artery There is mild diffuse disease throughout the vessel. Small, nondominant vessel with no significant stenosis.  Intervention  Prox LAD to Mid LAD lesion Stent CATH VISTA GUIDE 6FR XBLAD3.5 guide catheter was inserted. Lesion crossed with guidewire using a WIRE COUGAR XT STRL 190CM. Pre-stent angioplasty was performed. A drug-eluting stent was successfully placed using a SYNERGY XD 2.50X32. Post-stent angioplasty was performed using a BALL SAPPHIRE NC24 [id number]. Maximum pressure:  16 atm. Initially, the vessel is underfilled.  Following stenting there is improved distal flow.  The distal vessel is diffusely diseased and the stent is landed in a healthy area of the vessel.  All of the branch vessels remain patent. Post-Intervention Lesion Assessment The intervention was successful. Pre-interventional TIMI flow is 3. Post-intervention TIMI flow is 3. No complications occurred at this lesion. There is a 0% residual stenosis post intervention.    ECHOCARDIOGRAM  ECHOCARDIOGRAM COMPLETE 03/09/2023  Narrative ECHOCARDIOGRAM REPORT    Patient Name:   Kathleen [name] Date of Exam:  03/09/2023 Medical Rec #:  [id number]    Height:       60.0 in Accession #:    [id number]   Weight:       117.5 lb Date of Birth:  Sep 13, 1940    BSA:          1.489 m Patient Age:    82 years     BP:           111/79 mmHg Patient Gender: F            HR:           74 bpm. Exam Location:  Inpatient  Procedure: 2D Echo, Color Doppler and Cardiac Doppler  Indications:    Chest Pain  History:        Patient has prior history of Echocardiogram examinations, most recent 08/12/2022. CAD and Pericardial Effusion; Risk Factors:Hypertension and Dyslipidemia.  Sonographer:    Raeford Razor RDCS Referring Phys: [id number] Cecille Po MELVIN  IMPRESSIONS   1. Akinesis of the distal septum and apex; overall mild LV dysfunction. 2. Left ventricular ejection fraction, by estimation, is 45 to 50%. The left ventricle has mildly decreased function. The left ventricle demonstrates regional wall motion abnormalities (see scoring diagram/findings for description). Left ventricular diastolic parameters  are indeterminate. 3. Right ventricular systolic function is normal. The right ventricular size is normal. 4. A small pericardial effusion is present. 5. The mitral valve is normal in structure. Trivial mitral valve regurgitation. No evidence of mitral stenosis. 6. The aortic valve is tricuspid. Aortic valve regurgitation is trivial. Aortic valve sclerosis is present, with no evidence of aortic valve stenosis. 7. The inferior vena cava is normal in size with greater than 50% respiratory variability, suggesting right atrial pressure of 3 mmHg.  FINDINGS Left Ventricle: Left ventricular ejection fraction, by estimation, is 45 to 50%. The left ventricle has mildly decreased function. The left ventricle demonstrates regional wall motion abnormalities. The left ventricular internal cavity size was normal in size. There is no left ventricular hypertrophy. Left ventricular diastolic parameters are indeterminate.  Right  Ventricle: The right ventricular size is normal. Right ventricular systolic function is normal.  Left Atrium: Left atrial size was normal in size.  Right Atrium: Right atrial size was normal in size.  Pericardium: A small pericardial effusion is present.  Mitral Valve: The mitral valve is normal in structure. Trivial mitral valve regurgitation. No evidence of mitral valve stenosis. MV peak gradient, 8.1 mmHg. The mean mitral valve gradient is 2.0 mmHg.  Tricuspid Valve: The tricuspid valve is normal in structure. Tricuspid valve regurgitation is mild . No evidence of tricuspid stenosis.  Aortic Valve: The aortic valve is tricuspid. Aortic valve regurgitation is trivial. Aortic valve sclerosis is present, with no evidence of aortic valve stenosis. Aortic valve mean gradient measures 3.0 mmHg. Aortic valve peak gradient measures 5.5 mmHg. Aortic valve area, by VTI measures 2.16 cm.  Pulmonic Valve: The pulmonic valve was normal in structure. Pulmonic valve regurgitation is trivial. No evidence of pulmonic stenosis.  Aorta: The aortic root is normal in size and structure.  Venous: The inferior vena cava is normal in size with greater than 50% respiratory variability, suggesting right atrial pressure of 3 mmHg.  IAS/Shunts: No atrial level shunt detected by color flow Doppler.  Additional Comments: Akinesis of the distal septum and apex; overall mild LV dysfunction.   LEFT VENTRICLE PLAX 2D LVIDd:         3.90 cm     Diastology LVIDs:         3.10 cm     LV e' medial:    5.00 cm/s LV PW:         1.10 cm     LV E/e' medial:  24.6 LV IVS:        1.10 cm     LV e' lateral:   6.96 cm/s LVOT diam:     2.00 cm     LV E/e' lateral: 17.7 LV SV:         54 LV SV Index:   36 LVOT Area:     3.14 cm  LV Volumes (MOD) LV vol d, MOD A2C: 97.5 ml LV vol d, MOD A4C: 97.6 ml LV vol s, MOD A2C: 54.3 ml LV vol s, MOD A4C: 56.2 ml LV SV MOD A2C:     43.2 ml LV SV MOD A4C:     97.6 ml LV SV MOD  BP:      44.5 ml  RIGHT VENTRICLE             IVC RV Basal diam:  2.70 cm     IVC diam: 1.10 cm RV S prime:     11.40 cm/s TAPSE (M-mode): 2.7 cm  LEFT ATRIUM  Index        RIGHT ATRIUM           Index LA diam:        2.00 cm 1.34 cm/m   RA Area:     10.60 cm LA Vol (A2C):   26.9 ml 18.07 ml/m  RA Volume:   19.20 ml  12.89 ml/m LA Vol (A4C):   28.8 ml 19.34 ml/m LA Biplane Vol: 29.3 ml 19.68 ml/m AORTIC VALVE AV Area (Vmax):    2.15 cm AV Area (Vmean):   2.12 cm AV Area (VTI):     2.16 cm AV Vmax:           117.00 cm/s AV Vmean:          81.400 cm/s AV VTI:            0.249 m AV Peak Grad:      5.5 mmHg AV Mean Grad:      3.0 mmHg LVOT Vmax:         80.20 cm/s LVOT Vmean:        54.900 cm/s LVOT VTI:          0.171 m LVOT/AV VTI ratio: 0.69  AORTA Ao Root diam: 2.80 cm  MITRAL VALVE                TRICUSPID VALVE MV Area (PHT): 5.75 cm     TR Peak grad:   16.5 mmHg MV Area VTI:   2.11 cm     TR Vmax:        203.00 cm/s MV Peak grad:  8.1 mmHg MV Mean grad:  2.0 mmHg     SHUNTS MV Vmax:       1.42 m/s     Systemic VTI:  0.17 m MV Vmean:      64.7 cm/s    Systemic Diam: 2.00 cm MV Decel Time: 132 msec MV E velocity: 123.00 cm/s  Olga Millers MD Electronically signed by Olga Millers MD Signature Date/Time: 03/09/2023/12:01:49 PM    Final    CT SCANS  CT CORONARY MORPH W/CTA COR W/SCORE 01/28/2022  Addendum 01/28/2022  4:29 PM ADDENDUM REPORT: 01/28/2022 16:27  ADDENDUM: OVER-READ INTERPRETATION  CT CHEST  The following report is an over-read performed by radiologist Dr. Lovie Chol [facility] on 01/28/2022. This over-read does not include interpretation of cardiac or coronary anatomy or pathology. The coronary calcium score and coronary CT angiography. Interpretation by the cardiologist is attached. Imaging of the chest is focused on cardiac structures and excludes much of the chest on  CT.  COMPARISON:  None.  FINDINGS:  Cardiovascular: Signs of pericardial effusion, small to moderate. Aortic atherosclerosis. See dedicated cardiac imaging report for additional details.  Mediastinum/Nodes: No acute process or signs of adenopathy in the mediastinum.  Lungs/Pleura: Basilar atelectasis. No effusion. No consolidation. Airways to the extent visualized are patent.  Upper Abdomen: No acute process in the upper abdomen.  Musculoskeletal: Spinal degenerative changes without acute or destructive bone finding.  IMPRESSION:  1. Signs of pericardial effusion, small to moderate. 2. Aortic atherosclerosis.  Aortic Atherosclerosis (ICD10-I70.0).   Electronically Signed By: Donzetta Kohut M.D. On: 01/28/2022 16:27  Narrative CLINICAL DATA:  7F with systolic dysfunction on echo (EF 40-45%)  EXAM: Cardiac/Coronary CTA  TECHNIQUE: The patient was scanned on a [employer].  FINDINGS: A 100 kV prospective scan was triggered in the descending thoracic aorta at 111 HU's. Axial non-contrast 3 mm slices were carried  out through the heart. The data set was analyzed on a dedicated work station and scored using the Agatson method. Gantry rotation speed was 250 msecs and collimation was .6 mm. 0.8 mg of sl NTG was given. The 3D data set was reconstructed in 5% intervals of the 35-75 % of the R-R cycle. Phases were analyzed on a dedicated work station using MPR, MIP and VRT modes. The patient received 80 cc of contrast.  Coronary Arteries:  Normal coronary origin.  Left dominance.  RCA is small and nondominant  Left main is a large artery that gives rise to LAD and LCX arteries.  LAD is a large vessel. Mixed plaque in proximal LAD causes severe (70-99%) stenosis.  LCX is a dominant artery that gives rise to one large OM1 branch. Calcified plaque in proximal LCX causes 0-24% stenosis. Calcified plaque in OM1 causes 0-24% stenosis. Distal LCX  uninterpretable due to artifact from PVC during acquisition.  Other findings:  Left Ventricle: Normal size  Left Atrium: Normal size. PFO  Pulmonary Veins: Normal configuration  Right Ventricle: Normal size  Right Atrium: Normal size  Cardiac valves: Mild MAC  Thoracic aorta: Normal size  Pulmonary Arteries: Normal size  Systemic Veins: Normal drainage  Pericardium: Moderate effusion measuring up to 1.5cm adjacent to right atrium  IMPRESSION: 1. Obstructive CAD, with mixed plaque in the proximal LAD causing severe (70-99%) stenosis. Cardiac catheterization recommended  2. Coronary calcium score of 300. This was 67th percentile for age and sex matched control.  3.  Normal coronary origins with left dominance  4. Artifact due to PVC during acquisition. With ECG editing, coronary arteries are interpretable except for distal LCX  5. Moderate pericardial effusion measuring up to 1.5cm adjacent to right atrium  6.  PFO  CAD-RADS 4 Severe stenosis. (70-99% or > 50% left main). Cardiac catheterization is recommended. Consider symptom-guided anti-ischemic pharmacotherapy as well as risk factor modification per guideline directed care.  Electronically Signed: By: Epifanio Lesches M.D. On: 01/28/2022 16:03          EKG:        Recent Labs: 03/09/2023: Magnesium 1.8 05/22/2023: ALT 16; BUN 23; Creatinine, Ser 0.83; Hemoglobin 13.3; Platelets 210; Potassium 3.4; Sodium 140 06/22/2023: BNP 252.6; TSH 0.318  Recent Lipid Panel    Component Value Date/Time   CHOL 133 03/09/2023 0139   CHOL 122 02/18/2022 1151   TRIG 49 03/09/2023 0139   TRIG 79 07/25/2009 0000   HDL 52 03/09/2023 0139   HDL 52 02/18/2022 1151   CHOLHDL 2.6 03/09/2023 0139   VLDL 10 03/09/2023 0139   LDLCALC 71 03/09/2023 0139   LDLCALC 66 02/03/2023 1114           Physical Exam:    VS:  BP 130/78   Pulse 85   Ht 5' (1.524 m)   Wt 125 lb 9.6 oz (57 kg)   SpO2 98%   BMI 24.53  kg/m     Wt Readings from Last 3 Encounters:  08/13/23 125 lb 9.6 oz (57 kg)  08/06/23 118 lb (53.5 kg)  06/22/23 118 lb (53.5 kg)     GEN:  Well nourished, well developed elderly woman in no acute distress HEENT: Normal NECK: No JVD; No carotid bruits LYMPHATICS: No lymphadenopathy CARDIAC: RRR, no murmurs, rubs, gallops RESPIRATORY:  Clear to auscultation without rales, wheezing or rhonchi  ABDOMEN: Soft, non-tender, non-distended MUSCULOSKELETAL:  No edema; No deformity  SKIN: Warm and dry NEUROLOGIC:  Alert and oriented x  3 PSYCHIATRIC:  Normal affect   Assessment & Plan Pericardial effusion Most recent echo showed the effusion to be small (02/2023). Continue clinical follow-up. Coronary artery disease involving native coronary artery of native heart without angina pectoris Continue ASA and atorvastatin. No angina.  Essential hypertension BP well-controlled on losartan. Hyperlipidemia LDL goal <70 Treated with atorvastatin 80 mg daily. LDL 71.      Medication Adjustments/Labs and Tests Ordered: Current medicines are reviewed at length with the patient today.  Concerns regarding medicines are outlined above.  No orders of the defined types were placed in this encounter.  No orders of the defined types were placed in this encounter.   Patient Instructions  Follow-Up: At [facility], you and your health needs are our priority.  As part of our continuing mission to provide you with exceptional heart care, we have created designated Provider Care Teams.  These Care Teams include your primary Cardiologist (physician) and Advanced Practice Providers (APPs -  Physician Assistants and Nurse Practitioners) who all work together to provide you with the care you need, when you need it.  We recommend signing up for the patient portal called "MyChart".  Sign up information is provided on this After Visit Summary.  MyChart is used to connect with patients for Virtual Visits  (Telemedicine).  Patients are able to view lab/test results, encounter notes, upcoming appointments, etc.  Non-urgent messages can be sent to your provider as well.   To learn more about what you can do with MyChart, go to [email or web address].    Your next appointment:   6 month(s)  Provider:   Tonny Bollman, MD     Other Instructions   1st Floor: - Lobby - Registration  - Pharmacy  - Lab - Cafe  2nd Floor: - PV Lab - Diagnostic Testing (echo, CT, nuclear med)  3rd Floor: - Vacant  4th Floor: - TCTS (cardiothoracic surgery) - AFib Clinic - Structural Heart Clinic - Vascular Surgery  - Vascular Ultrasound  5th Floor: - [facility] (general and EP) - Clinical Pharmacy for coumadin, hypertension, lipid, weight-loss medications, and med management appointments    Valet parking services will be available as well.          Signed, Tonny Bollman, MD  08/20/2023 7:07 AM     [facility]

## 2023-08-20 ENCOUNTER — Encounter: Payer: Self-pay | Admitting: Cardiovascular Disease

## 2023-08-20 NOTE — Assessment & Plan Note (Signed)
BP well-controlled on losartan.

## 2023-08-21 ENCOUNTER — Ambulatory Visit: Payer: HMO

## 2023-08-21 ENCOUNTER — Ambulatory Visit (INDEPENDENT_AMBULATORY_CARE_PROVIDER_SITE_OTHER): Payer: HMO | Admitting: Sports Medicine

## 2023-08-21 DIAGNOSIS — M25512 Pain in left shoulder: Secondary | ICD-10-CM

## 2023-08-21 DIAGNOSIS — M4802 Spinal stenosis, cervical region: Secondary | ICD-10-CM | POA: Diagnosis not present

## 2023-08-21 DIAGNOSIS — M19012 Primary osteoarthritis, left shoulder: Secondary | ICD-10-CM | POA: Diagnosis not present

## 2023-08-21 DIAGNOSIS — G8929 Other chronic pain: Secondary | ICD-10-CM

## 2023-08-21 DIAGNOSIS — M5030 Other cervical disc degeneration, unspecified cervical region: Secondary | ICD-10-CM

## 2023-08-21 DIAGNOSIS — M542 Cervicalgia: Secondary | ICD-10-CM | POA: Diagnosis not present

## 2023-08-21 DIAGNOSIS — M4803 Spinal stenosis, cervicothoracic region: Secondary | ICD-10-CM | POA: Diagnosis not present

## 2023-08-21 DIAGNOSIS — M47812 Spondylosis without myelopathy or radiculopathy, cervical region: Secondary | ICD-10-CM | POA: Diagnosis not present

## 2023-08-21 MED ORDER — PREDNISONE 50 MG PO TABS: ORAL_TABLET | ORAL | 0 refills | Status: DC

## 2023-08-21 NOTE — Progress Notes (Signed)
    Procedures performed today:    None.  Independent interpretation of notes and tests performed by another provider:   None.  Brief History, Exam, Impression, and Recommendations:    Left shoulder pain This is a very pleasant 83 year old female, she has known massive rotator cuff tear with glenohumeral osteoarthritis, she was initially scheduled for shoulder arthroplasty. Ultimately this did not go through, she is having continued pain that she localizes in the left axilla with some radiation to the left periscapular region. Not much over the shoulder or glenohumeral joint. Her shoulder exam is for the most part unrevealing, this makes me think that a lot of her pain is coming from her cervical spine, we will get C-spine x-rays, updated shoulder x-rays, 5 days of prednisone, formal PT for the neck and the shoulder, would like to see her back in about 6 weeks and we can consider MRI of the neck if the pain declares itself as cervical or glenohumeral joint injection if the pain declares itself is coming from her shoulder. Of note she is recently status post cardiac stent.  She has good exercise tolerance.    ____________________________________________ Ihor Austin. Benjamin Stain, M.D., ABFM., CAQSM., AME. Primary Care and Sports Medicine [facility]  Adjunct Professor of Family Medicine  [employer]  Restaurant manager, fast food

## 2023-08-21 NOTE — Assessment & Plan Note (Signed)
This is a very pleasant 83 year old female, she has known massive rotator cuff tear with glenohumeral osteoarthritis, she was initially scheduled for shoulder arthroplasty. Ultimately this did not go through, she is having continued pain that she localizes in the left axilla with some radiation to the left periscapular region. Not much over the shoulder or glenohumeral joint. Her shoulder exam is for the most part unrevealing, this makes me think that a lot of her pain is coming from her cervical spine, we will get C-spine x-rays, updated shoulder x-rays, 5 days of prednisone, formal PT for the neck and the shoulder, would like to see her back in about 6 weeks and we can consider MRI of the neck if the pain declares itself as cervical or glenohumeral joint injection if the pain declares itself is coming from her shoulder. Of note she is recently status post cardiac stent.  She has good exercise tolerance.

## 2023-08-25 DIAGNOSIS — E039 Hypothyroidism, unspecified: Secondary | ICD-10-CM | POA: Diagnosis not present

## 2023-08-26 ENCOUNTER — Encounter: Payer: Self-pay | Admitting: Family Medicine

## 2023-08-26 LAB — TSH: TSH: 3.85 u[IU]/mL (ref 0.450–4.500)

## 2023-08-28 NOTE — Telephone Encounter (Signed)
 OK to place new derm ref as below

## 2023-08-28 NOTE — Telephone Encounter (Signed)
 Copied from CRM [id number]. Topic: Referral - Question >> Aug 28, 2023 11:08 AM Kathleen [name] wrote: Reason for CRM: Patient's health insurance recently changed to [employer] HMO - she has been seeing a dermatology practice once a year for her skin cancer and now requires a referral. Patient sees Kathleen Piety PA-C - Please contact patient if you have any questions.   [facility] [address] [place], [place] [address]  Phone: [phone]  Fax: [phone]

## 2023-08-31 ENCOUNTER — Other Ambulatory Visit: Payer: Self-pay

## 2023-08-31 DIAGNOSIS — Z85828 Personal history of other malignant neoplasm of skin: Secondary | ICD-10-CM

## 2023-09-09 ENCOUNTER — Ambulatory Visit: Payer: HMO | Admitting: Orthopedic Surgery

## 2023-09-14 ENCOUNTER — Ambulatory Visit: Payer: HMO | Admitting: Orthopedic Surgery

## 2023-09-14 DIAGNOSIS — M12812 Other specific arthropathies, not elsewhere classified, left shoulder: Secondary | ICD-10-CM | POA: Diagnosis not present

## 2023-09-14 DIAGNOSIS — M25512 Pain in left shoulder: Secondary | ICD-10-CM

## 2023-09-14 DIAGNOSIS — G8929 Other chronic pain: Secondary | ICD-10-CM

## 2023-09-15 ENCOUNTER — Encounter: Payer: Self-pay | Admitting: Orthopedic Surgery

## 2023-09-15 NOTE — Progress Notes (Signed)
 Office Visit Note   Patient: Kathleen [name]           Date of Birth: Dec 29, 1940           MRN: [id number] Visit Date: 09/14/2023 Requested by: Agapito Games, MD [address] [place],  [place] [address] PCP: Agapito Games, MD  Subjective: Chief Complaint  Patient presents with   Left Shoulder - Pain    HPI: Kathleen [name] is a 83 y.o. female who presents to the office reporting left shoulder pain.  She reports continued pain.  Has a known history of rotator cuff arthropathy.  Had a fall 1-1/2 years ago needed surgery but canceled it due to medical comorbidities.  Last seen about a year ago.  Tried prednisone orally which did not help.  Patient does take care of her husband of 65 years and he has more medical comorbidities than she does.  Patient does have an EF of 40 to 45%.  Was thinking about surgery.  Wants to be active but she reports decreased strength in the arm.  Also has a history of breast cancer and neck surgery.  She states she uses her right arm to lift the left 1 overhead..                ROS: All systems reviewed are negative as they relate to the chief complaint within the history of present illness.  Patient denies fevers or chills.  Assessment & Plan: Visit Diagnoses: No diagnosis found.  Plan: Impression is left shoulder rotator cuff arthropathy which is aggravating but not yet debilitating.  She does have pretty good function below shoulder level.  Talked with Jovanka extensively about the risk and benefits of surgery which would be higher based on her age as well as other medical comorbidities.  Also it would incapacitate her for period of likely 2 to 3 weeks from helping her husband with both arms.  At this point she wants to hold off on any intervention.  We will see if her situation changes.  I think episodic intra-articular cortisone injections may give her little relief.  Follow-up as needed.  Follow-Up Instructions: No follow-ups on file.    Orders:  No orders of the defined types were placed in this encounter.  No orders of the defined types were placed in this encounter.     Procedures: No procedures performed   Clinical Data: No additional findings.  Objective: Vital Signs: There were no vitals taken for this visit.  Physical Exam:  Constitutional: Patient appears well-developed HEENT:  Head: Normocephalic Eyes:EOM are normal Neck: Normal range of motion Cardiovascular: Normal rate Pulmonary/chest: Effort normal Neurologic: Patient is alert Skin: Skin is warm Psychiatric: Patient has normal mood and affect  Ortho Exam: Ortho exam demonstrates left shoulder passive range of motion of 60/95/160.  Does not quite have forward flexion and abduction actively above 90 degrees.  Pretty reasonable subscap strength but weakness with external rotation on the left is present.  Cervical spine has pretty good range of motion with good motor or sensory function in both arms and palpable radial pulses.  Specialty Comments:  No specialty comments available.  Imaging: No results found.   PMFS History: Patient Active Problem List   Diagnosis Date Noted   Left shoulder pain 08/21/2023   Stress at home 08/07/2023   Mild left ventricular systolic dysfunction 03/11/2023   Chest pain 03/09/2023   Unstable angina (HCC) 03/08/2023   Heart failure with mildly  reduced ejection fraction (HFmrEF) (HCC) 03/08/2023   Orthostasis 03/12/2022   S/P coronary artery stent placement 03/12/2022   Abnormal cardiac CT angiography 02/10/2022   Lumbar foraminal stenosis 09/12/2021   Weight loss 07/30/2021   Restrictive lung disease 05/15/2021   S/P stroke due to cerebrovascular disease 09/17/2018   Primary osteoarthritis of left hip 07/08/2017   Primary osteoarthritis of both knees 02/25/2017   Lumbar spondylosis 12/03/2016   History of breast cancer 11/09/2012   Benign hematuria 07/05/2010   Impaired fasting glucose 12/11/2008    Disorder of bone and cartilage 02/26/2007   Hypothyroidism 04/28/2006   Essential hypertension 04/28/2006   MITRAL VALVE DISORDER 04/28/2006   Past Medical History:  Diagnosis Date   Cancer (HCC)    Hx RT breast infiltrated ductal CA previously on tamoxifen and armidex    Complete tear of left rotator cuff 01/01/2022   Gross hematuria 12/04/2021   Herpes simplex    lt eye   HIP PAIN, RIGHT 01/05/2008   Qualifier: Diagnosis of   By: Cathey Endow DO, Karen         Hypertension    Impingement syndrome of left shoulder 12/02/2021   Internal derangement of left knee 10/09/2016   Kidney stones    history   LEG PAIN, BILATERAL 01/20/2008   Qualifier: Diagnosis of   By: Linford Arnold MD, Catherine         MVP (mitral valve prolapse)    asymptomatic   Personal history of chemotherapy    Personal history of radiation therapy    Radial styloid fracture 09/17/2021    Family History  Problem Relation Age of Onset   Breast cancer Mother     Past Surgical History:  Procedure Laterality Date   ABDOMINAL HYSTERECTOMY     partial   BREAST BIOPSY     BREAST LUMPECTOMY Right 2000   RT   CERVICAL LAMINECTOMY  1986   CORONARY STENT INTERVENTION N/A 02/10/2022   Procedure: CORONARY STENT INTERVENTION;  Surgeon: Tonny Bollman, MD;  [facility] IN[facility]AS[facility] CV LAB;  Service: Cardiovascular;  Laterality: N/A;   RIGHT/LEFT HEART CATH AND CORONARY ANGIOGRAPHY N/A 02/10/2022   Procedure: RIGHT/LEFT HEART CATH AND CORONARY ANGIOGRAPHY;  Surgeon: Tonny Bollman, MD;  Location: [facility] INVASIVE CV LAB;  Service: Cardiovascular;  Laterality: N/A;   SHOULDER SURGERY     rotator cuff repair   throidectomy  1988   For cancer    Social History   Occupational History   Occupation: Working full-time at [employer].  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

## 2023-09-18 ENCOUNTER — Ambulatory Visit: Payer: HMO | Admitting: Sports Medicine

## 2023-10-01 ENCOUNTER — Ambulatory Visit: Payer: Self-pay | Admitting: Family Medicine

## 2023-10-01 NOTE — Telephone Encounter (Signed)
 Copied from CRM [id number]. Topic: Clinical - Red Word Triage >> Oct 01, 2023  9:28 AM Louie Casa B wrote: [facility] that prompted transfer to Nurse Triage: patient is sick with a sore throat as well as a cough patient says her body is sore  Chief Complaint: cough, sore throat Symptoms: cough, sore throat, redness in throat Frequency: yesterday Pertinent Negatives: Patient denies fever Disposition: [] ED /[] Urgent Care (no appt availability in office) / [] Appointment(In office/virtual)/ []  [facility]/ [] Home Care/ [] Refused Recommended Disposition /[x] [facility]/ []  Follow-up with PCP Additional Notes:When agent transferred pt to nurse agent informed patient stated she was an elderly lady that did not want to come into office but wanted an Rx - agent advised pt that she would need to come in to get a Rx then pt stated wanted to speak to nurse to see what she could get out of the nurse without having to come in. unable to complete triage for pt: Pt refused to answer anymore questions - stated she was going to hang up and go to drug store to get some medicine.  I attempted to explain to pt I needed to ask questions in order to get a clear picture of what was going on with patient in order to present to provider to attempt to request a Rx for pt: pt hung up therefore unable to complete triage or give care advice.  Answer Assessment - Initial Assessment Questions 1. ONSET: "When did the throat start hurting?" (Hours or days ago)      yesterday 2. SEVERITY: "How bad is the sore throat?" (Scale 1-10; mild, moderate or severe)   - MILD (1-3):  Doesn't interfere with eating or normal activities.   - MODERATE (4-7): Interferes with eating some solids and normal activities.   - SEVERE (8-10):  Excruciating pain, interferes with most normal activities.   - SEVERE WITH DYSPHAGIA (10): Can't swallow liquids, drooling.     Pt refused to answer 3. STREP EXPOSURE: "Has there been any exposure to  strep within the past week?" If Yes, ask: "What type of contact occurred?"      N/a 4.  VIRAL SYMPTOMS: "Are there any symptoms of a cold, such as a runny nose, cough, hoarse voice or red eyes?"      Hoarse, cough, sore throat and throat red, body aches, runny nose 5. FEVER: "Do you have a fever?" If Yes, ask: "What is your temperature, how was it measured, and when did it start?"     no 6. PUS ON THE TONSILS: "Is there pus on the tonsils in the back of your throat?"     unknown 7. OTHER SYMPTOMS: "Do you have any other symptoms?" (e.g., difficulty breathing, headache, rash)     no 8. PREGNANCY: "Is there any chance you are pregnant?" "When was your last menstrual period?"     N/a  Answer Assessment - Initial Assessment Questions 1. ONSET: "When did the cough begin?"      Pt refused to answer 2. SEVERITY: "How bad is the cough today?"      Cough makes throat hurt 3. SPUTUM: "Describe the color of your sputum" (none, dry cough; clear, white, yellow, green)     Pt refused to answer anymore questions - stated she was going to hang up and go to drug store to get some medicine 4. HEMOPTYSIS: "Are you coughing up any blood?" If so ask: "How much?" (flecks, streaks, tablespoons, etc.)     Pt  refused to answer anymore questions - stated she was going to hang up and go to drug store to get some medicine 5. DIFFICULTY BREATHING: "Are you having difficulty breathing?" If Yes, ask: "How bad is it?" (e.g., mild, moderate, severe)    - MILD: No SOB at rest, mild SOB with walking, speaks normally in sentences, can lie down, no retractions, pulse < 100.    - MODERATE: SOB at rest, SOB with minimal exertion and prefers to sit, cannot lie down flat, speaks in phrases, mild retractions, audible wheezing, pulse 100-120.    - SEVERE: Very SOB at rest, speaks in single words, struggling to breathe, sitting hunched forward, retractions, pulse > 120      Pt refused to answer anymore questions - stated she was  going to hang up and go to drug store to get some medicine 6. FEVER: "Do you have a fever?" If Yes, ask: "What is your temperature, how was it measured, and when did it start?"     no 7. CARDIAC HISTORY: "Do you have any history of heart disease?" (e.g., heart attack, congestive heart failure)      Pt refused to answer anymore questions - stated she was going to hang up and go to drug store to get some medicine 8. LUNG HISTORY: "Do you have any history of lung disease?"  (e.g., pulmonary embolus, asthma, emphysema)     Pt refused to answer anymore questions - stated she was going to hang up and go to drug store to get some medicine 9. PE RISK FACTORS: "Do you have a history of blood clots?" (or: recent major surgery, recent prolonged travel, bedridden)     Pt refused to answer anymore questions - stated she was going to hang up and go to drug store to get some medicine 10. OTHER SYMPTOMS: "Do you have any other symptoms?" (e.g., runny nose, wheezing, chest pain)       Pt refused to answer anymore questions - stated she was going to hang up and go to drug store to get some medicine 11. PREGNANCY: "Is there any chance you are pregnant?" "When was your last menstrual period?"       Pt refused to answer anymore questions - stated she was going to hang up and go to drug store to get some medicine 12. TRAVEL: "Have you traveled out of the country in the last month?" (e.g., travel history, exposures)       Pt refused to answer anymore questions - stated she was going to hang up and go to drug store to get some medicine  Protocols used: Sore Throat-A-AH, Cough - Acute Productive-A-AH

## 2023-10-08 ENCOUNTER — Encounter: Payer: Self-pay | Admitting: Family Medicine

## 2023-10-08 ENCOUNTER — Ambulatory Visit (INDEPENDENT_AMBULATORY_CARE_PROVIDER_SITE_OTHER): Payer: Self-pay | Admitting: Family Medicine

## 2023-10-08 VITALS — BP 128/88 | HR 88 | Ht 60.0 in | Wt 125.0 lb

## 2023-10-08 DIAGNOSIS — Z955 Presence of coronary angioplasty implant and graft: Secondary | ICD-10-CM

## 2023-10-08 DIAGNOSIS — I10 Essential (primary) hypertension: Secondary | ICD-10-CM | POA: Diagnosis not present

## 2023-10-08 DIAGNOSIS — Z8673 Personal history of transient ischemic attack (TIA), and cerebral infarction without residual deficits: Secondary | ICD-10-CM | POA: Diagnosis not present

## 2023-10-08 DIAGNOSIS — J0120 Acute ethmoidal sinusitis, unspecified: Secondary | ICD-10-CM

## 2023-10-08 DIAGNOSIS — E039 Hypothyroidism, unspecified: Secondary | ICD-10-CM | POA: Diagnosis not present

## 2023-10-08 DIAGNOSIS — E876 Hypokalemia: Secondary | ICD-10-CM | POA: Diagnosis not present

## 2023-10-08 MED ORDER — DOXYCYCLINE HYCLATE 100 MG PO TABS: 100.0000 mg | ORAL_TABLET | Freq: Two times a day (BID) | ORAL | 0 refills | Status: DC

## 2023-10-08 MED ORDER — ATORVASTATIN CALCIUM 80 MG PO TABS: 40.0000 mg | ORAL_TABLET | Freq: Every day | ORAL | 3 refills | Status: DC

## 2023-10-08 NOTE — Assessment & Plan Note (Signed)
 Did decrease her statin at last visit she has been taking a half a tab and says she actually feels much better she does not feel as tired and weak.  Go ahead and switch to the 40 mg dose.

## 2023-10-08 NOTE — Progress Notes (Signed)
 Established Patient Office Visit  Subjective  Patient ID: Kathleen [name], female    DOB: [birth year]  Age: 83 y.o. MRN: [id number]  Chief Complaint  Patient presents with   Hypothyroidism   Hypertension    HPI  We adjusted her thyroid medication lat time.  She si doing well and no problems with the change.  She reports that she has been sick for 2 weeks with cough, runny nose and lots of sneezing.  She said she never developed a fever but now her husband sick she has been mostly using some Coricidin and lozenges.    ROS    Objective:     BP 137/85   Pulse 88   Ht 5' (1.524 m)   Wt 125 lb (56.7 kg)   SpO2 100%   BMI 24.41 kg/m    Physical Exam Constitutional:      Appearance: Normal appearance.  HENT:     Head: Normocephalic and atraumatic.     Right Ear: Ear canal and external ear normal. There is no impacted cerumen.     Left Ear: Ear canal and external ear normal. There is no impacted cerumen.     Ears:     Comments: Both TM blocked by cerumen.     Nose: Nose normal.     Mouth/Throat:     Pharynx: Oropharynx is clear.  Eyes:     Conjunctiva/sclera: Conjunctivae normal.  Cardiovascular:     Rate and Rhythm: Normal rate and regular rhythm.  Pulmonary:     Effort: Pulmonary effort is normal.     Breath sounds: Normal breath sounds.  Musculoskeletal:     Cervical back: Neck supple. No tenderness.  Lymphadenopathy:     Cervical: No cervical adenopathy.  Skin:    General: Skin is warm and dry.  Neurological:     Mental Status: She is alert and oriented to person, place, and time.  Psychiatric:        Mood and Affect: Mood normal.      No results found for any visits on 10/08/23.    The ASCVD Risk score (Arnett DK, et al., 2019) failed to calculate for the following reasons:   The 2019 ASCVD risk score is only valid for ages 27 to 6   Risk score cannot be calculated because patient has a medical history suggesting prior/existing ASCVD     Assessment & Plan:   Problem List Items Addressed This Visit       Cardiovascular and Mediastinum   Essential hypertension - Primary   BP ok today.       Relevant Medications   atorvastatin (LIPITOR) 80 MG tablet     Endocrine   Hypothyroidism   Due to recheck TSH.  We did make a slight adjustment when I saw her last time.      Relevant Orders   TSH     Other   S/P stroke due to cerebrovascular disease   Relevant Medications   atorvastatin (LIPITOR) 80 MG tablet   S/P coronary artery stent placement   Did decrease her statin at last visit she has been taking a half a tab and says she actually feels much better she does not feel as tired and weak.  Go ahead and switch to the 40 mg dose.      Other Visit Diagnoses       Hypokalemia       Relevant Orders   Basic Metabolic Panel (BMET)     Acute non-recurrent  ethmoidal sinusitis       Relevant Medications   doxycycline (VIBRA-TABS) 100 MG tablet      Cute sinusitis with symptoms x 2 weeks-chest is clear on exam.  Will go and treat with doxycycline.  Call if not better in 1 week.  Return in about 4 months (around 02/07/2024) for Hypertension, thyroid .    Nani Gasser, MD

## 2023-10-08 NOTE — Assessment & Plan Note (Signed)
 Due to recheck TSH.  We did make a slight adjustment when I saw her last time.

## 2023-10-08 NOTE — Assessment & Plan Note (Signed)
BP ok today

## 2023-10-09 ENCOUNTER — Encounter: Payer: Self-pay | Admitting: Family Medicine

## 2023-10-09 LAB — BASIC METABOLIC PANEL
BUN/Creatinine Ratio: 30 — ABNORMAL HIGH (ref 12–28)
BUN: 28 mg/dL — ABNORMAL HIGH (ref 8–27)
CO2: 22 mmol/L (ref 20–29)
Calcium: 9.6 mg/dL (ref 8.7–10.3)
Chloride: 106 mmol/L (ref 96–106)
Creatinine, Ser: 0.93 mg/dL (ref 0.57–1.00)
Glucose: 100 mg/dL — ABNORMAL HIGH (ref 70–99)
Potassium: 3.7 mmol/L (ref 3.5–5.2)
Sodium: 145 mmol/L — ABNORMAL HIGH (ref 134–144)
eGFR: 61 mL/min/{1.73_m2} (ref >59)

## 2023-10-09 LAB — TSH: TSH: 4.16 u[IU]/mL (ref 0.450–4.500)

## 2023-10-09 NOTE — Progress Notes (Signed)
 Hi Kathleen [name], kidney function is stable which is great for some reason your sodium is just a little elevated which is a little unusual not really sure why but it is only off by 1 point so doing a recheck that maybe in 3 to 4 weeks.  Make sure that you are hydrating well sometimes if you are a little on the dry side it can concentrate the sodium in your blood.  Thyroid level is on the high end of normal.  Have you missed any doses lately or run out of medication?

## 2023-10-23 ENCOUNTER — Encounter: Payer: Self-pay | Admitting: *Deleted

## 2023-10-26 NOTE — Telephone Encounter (Signed)
 Spoke with patient   informed of results.  She is currently taking levothyroxine 50 mcg  and has not missed any doses She is feeling poorly  Fatigue, not sleeping well and leg weakness.

## 2023-10-27 ENCOUNTER — Other Ambulatory Visit: Payer: Self-pay

## 2023-10-27 DIAGNOSIS — E039 Hypothyroidism, unspecified: Secondary | ICD-10-CM

## 2023-10-27 NOTE — Telephone Encounter (Signed)
 Patient informed.

## 2023-10-27 NOTE — Telephone Encounter (Signed)
 K, then lets bump the dose just slightly.  I want her to take 1-1/2 tabs on Sunday and just 1 tab the other 6 days of the week.  She will repeat this pattern every week and then we will plan to recheck the level, TSH, in about 8 weeks.  We could see if this helps at all with some of the fatigue and leg weakness may or may not help so much with sleep we will just have to see.

## 2023-11-17 DIAGNOSIS — L814 Other melanin hyperpigmentation: Secondary | ICD-10-CM | POA: Diagnosis not present

## 2023-11-17 DIAGNOSIS — Z08 Encounter for follow-up examination after completed treatment for malignant neoplasm: Secondary | ICD-10-CM | POA: Diagnosis not present

## 2023-11-17 DIAGNOSIS — Z85828 Personal history of other malignant neoplasm of skin: Secondary | ICD-10-CM | POA: Diagnosis not present

## 2023-11-17 DIAGNOSIS — L820 Inflamed seborrheic keratosis: Secondary | ICD-10-CM | POA: Diagnosis not present

## 2023-11-17 DIAGNOSIS — L570 Actinic keratosis: Secondary | ICD-10-CM | POA: Diagnosis not present

## 2023-11-17 DIAGNOSIS — L821 Other seborrheic keratosis: Secondary | ICD-10-CM | POA: Diagnosis not present

## 2023-11-17 DIAGNOSIS — D225 Melanocytic nevi of trunk: Secondary | ICD-10-CM | POA: Diagnosis not present

## 2023-11-17 DIAGNOSIS — L538 Other specified erythematous conditions: Secondary | ICD-10-CM | POA: Diagnosis not present

## 2023-12-23 DIAGNOSIS — H1712 Central corneal opacity, left eye: Secondary | ICD-10-CM | POA: Diagnosis not present

## 2023-12-23 DIAGNOSIS — H26491 Other secondary cataract, right eye: Secondary | ICD-10-CM | POA: Diagnosis not present

## 2023-12-23 DIAGNOSIS — H35372 Puckering of macula, left eye: Secondary | ICD-10-CM | POA: Diagnosis not present

## 2023-12-31 ENCOUNTER — Ambulatory Visit (INDEPENDENT_AMBULATORY_CARE_PROVIDER_SITE_OTHER): Admitting: Family Medicine

## 2023-12-31 ENCOUNTER — Encounter: Payer: Self-pay | Admitting: Family Medicine

## 2023-12-31 VITALS — BP 126/74 | HR 74 | Ht 60.0 in | Wt 123.0 lb

## 2023-12-31 DIAGNOSIS — J029 Acute pharyngitis, unspecified: Secondary | ICD-10-CM

## 2023-12-31 DIAGNOSIS — R350 Frequency of micturition: Secondary | ICD-10-CM

## 2023-12-31 DIAGNOSIS — R051 Acute cough: Secondary | ICD-10-CM | POA: Diagnosis not present

## 2023-12-31 DIAGNOSIS — R319 Hematuria, unspecified: Secondary | ICD-10-CM

## 2023-12-31 DIAGNOSIS — L299 Pruritus, unspecified: Secondary | ICD-10-CM | POA: Diagnosis not present

## 2023-12-31 DIAGNOSIS — I10 Essential (primary) hypertension: Secondary | ICD-10-CM | POA: Diagnosis not present

## 2023-12-31 DIAGNOSIS — R1032 Left lower quadrant pain: Secondary | ICD-10-CM | POA: Diagnosis not present

## 2023-12-31 LAB — POCT URINALYSIS DIP (CLINITEK)
Glucose, UA: 100 mg/dL — AB
Ketones, POC UA: NEGATIVE mg/dL
Leukocytes, UA: NEGATIVE
Nitrite, UA: NEGATIVE
POC PROTEIN,UA: 30 — AB
Spec Grav, UA: >=1.03 — AB (ref 1.010–1.025)
Urobilinogen, UA: 0.2 U/dL
pH, UA: 5.5 (ref 5.0–8.0)

## 2023-12-31 MED ORDER — FLUTICASONE PROPIONATE 50 MCG/ACT NA SUSP: 2.0000 | Freq: Every day | NASAL | 1 refills | Status: DC

## 2023-12-31 NOTE — Patient Instructions (Addendum)
 The itching on your chest recommend over-the-counter lidocaine  gel applied at bedtime when it bothers you the most.  If that is not providing some relief and you would like something different we can always look at a medication called gabapentin 

## 2023-12-31 NOTE — Addendum Note (Signed)
 Addended by: Izora Marten on: 12/31/2023 01:16 PM   Modules accepted: Orders

## 2023-12-31 NOTE — Progress Notes (Signed)
 Acute Office Visit  Subjective:     Patient ID: Kathleen [name], female    DOB: Oct 19, 1940, 83 y.o.   MRN: [id number]  No chief complaint on file.   HPI Patient is in today for pain in the LLQ that is worse when goes to the bathroom. Has had more frequent urination.  + low energy.  She says the pain started after her big dog jumped up on her lap and hit that area it has been tender since but it is better than it was.  No pain with movement just if she puts pressure directly on it.  No change in bowel movements.  No fevers or chills.  ST and cough x 2 days.  She has had a a lot of drainage and postnasal drip.  No fevers or chills she did feel like she had some sweats this morning.  She does have a history of restrictive lung disease.  She still having that persistent itching on that right breast, that is chronic.  She does moisturize the skin...  Had a mammogram back in the fall that was normal and the itching was going on months before that..  She has have a prior history of right breast cancer  She also has a crusty spot on her left scalp just above her ear.  She said she has had a raised bump in that area for years that is not been bothersome but more recently noticed a little crusty spot on it  ROS      Objective:    BP 126/74 (BP Location: Left Arm, Cuff Size: Normal)   Pulse 74   Ht 5' (1.524 m)   Wt 123 lb 0.6 oz (55.8 kg)   SpO2 100%   BMI 24.03 kg/m    Physical Exam Constitutional:      Appearance: Normal appearance.  HENT:     Head: Normocephalic and atraumatic.     Right Ear: Tympanic membrane, ear canal and external ear normal. There is no impacted cerumen.     Left Ear: Tympanic membrane, ear canal and external ear normal. There is no impacted cerumen.     Nose: Nose normal.     Mouth/Throat:     Pharynx: Oropharynx is clear.   Eyes:     Conjunctiva/sclera: Conjunctivae normal.    Cardiovascular:     Rate and Rhythm: Normal rate and regular rhythm.   Pulmonary:     Effort: Pulmonary effort is normal.     Breath sounds: Normal breath sounds.   Musculoskeletal:     Cervical back: Neck supple. No tenderness.  Lymphadenopathy:     Cervical: No cervical adenopathy.   Skin:    General: Skin is warm and dry.     Comments: In her scalp on the left side just above her ear she has what looks like a smooth round sebaceous cyst but there is some white crust in the center I took a scalpel and gently scraped the crust away.  No active drainage underneath.   Neurological:     Mental Status: She is alert and oriented to person, place, and time.   Psychiatric:        Mood and Affect: Mood normal.     Results for orders placed or performed in visit on 12/31/23  POCT URINALYSIS DIP (CLINITEK)  Result Value Ref Range   Color, UA yellow yellow   Clarity, UA clear clear   Glucose, UA =100 (A) negative mg/dL   Bilirubin, UA small (  A) negative   Ketones, POC UA negative negative mg/dL   Spec Grav, UA >=2.956 (A) 1.010 - 1.025   Blood, UA moderate (A) negative   pH, UA 5.5 5.0 - 8.0   POC PROTEIN,UA =30 (A) negative, trace   Urobilinogen, UA 0.2 0.2 or 1.0 E.U./dL   Nitrite, UA Negative Negative   Leukocytes, UA Negative Negative        Assessment & Plan:   Problem List Items Addressed This Visit       Cardiovascular and Mediastinum   Essential hypertension   Other Visit Diagnoses       Frequent urination    -  Primary   Relevant Orders   POCT URINALYSIS DIP (CLINITEK) (Completed)     Itch of skin         Acute cough         Sore throat         LLQ pain          Cough and sore throat-could be from postnasal drip from allergies versus an upper respiratory infection.  Will treat with a nasal steroid spray but if not improving over the next week then let us  know.  Again may be more consistent with an upper respiratory viral illness she does have a history of restrictive lung disease so if she feels like her chest symptoms are  worsening she can let us  know sooner rather than later.  Chronic itching in right breast-discussed possible treatment options.  She could use an over-the-counter lidocaine  gel and if that is not helpful consider trial of gabapentin .  Does have a follow-up with her dermatologist in July or August this year we did discuss that if the cyst starts to crust again to please discuss with her dermatologist to have it removed.  I think is just a little keratin buildup.  No evidence of atypical cells.  UA showing blood and glucose. Will send for culture if have enough to process. She is Prediabetic and last A1C ok but may need to repeat early until of in 2 months.    LLQ pain - nontender on exam.  No red flag sxs. It is better. Started after her dog jumped on her lap.  Bowels are moving normally.  Gave reassurance.  If not continuing to improve in the next week let us  know.  Lab Results  Component Value Date   HGBA1C 5.9 (A) 08/06/2023     Meds ordered this encounter  Medications   fluticasone (FLONASE) 50 MCG/ACT nasal spray    Sig: Place 2 sprays into both nostrils daily.    Dispense:  16 g    Refill:  1    I spent 45 minutes on the day of the encounter to include pre-visit record review, face-to-face time with the patient and post visit ordering of test.   No follow-ups on file.  Duaine German, MD

## 2024-01-01 ENCOUNTER — Ambulatory Visit: Payer: Self-pay

## 2024-01-01 ENCOUNTER — Other Ambulatory Visit: Payer: Self-pay | Admitting: Family Medicine

## 2024-01-01 MED ORDER — PREDNISONE 20 MG PO TABS: 40.0000 mg | ORAL_TABLET | Freq: Every day | ORAL | 0 refills | Status: DC

## 2024-01-01 NOTE — Telephone Encounter (Signed)
 Sent over prednisone .

## 2024-01-01 NOTE — Addendum Note (Signed)
 Addended by: Jalynne Persico D on: 01/01/2024 12:15 PM   Modules accepted: Orders

## 2024-01-01 NOTE — Telephone Encounter (Signed)
 FYI Only or Action Required?: Action required by provider - Pt would like medications called in  Patient was last seen in primary care on 12/31/2023 by Cydney Draft, MD. Called Nurse Triage reporting Very Sore Throat, Body aches & cough. Symptoms began yesterday. Interventions attempted: Nothing. Symptoms are: rapidly worsening.  Triage Disposition: See Physician Within 24 Hours  Patient/caregiver understands and will follow disposition?: No, refuses disposition. Pt wants provider to call in medication.              Copied from CRM [id number]. Topic: Clinical - Red Word Triage >> Jan 01, 2024  8:23 AM Kathleen [name] F wrote: Red Word that prompted transfer to Nurse Triage:   Patient has a really sore throat but was not prescribed anything; Having a hard time swallowing;  Body aches Reason for Disposition  SEVERE (e.g., excruciating) throat pain  Answer Assessment - Initial Assessment Questions 1. ONSET: When did the throat start hurting? (Hours or days ago)      A few days 2. SEVERITY: How bad is the sore throat? (Scale 1-10; mild, moderate or severe)   - MILD (1-3):  Doesn't interfere with eating or normal activities.   - MODERATE (4-7): Interferes with eating some solids and normal activities.   - SEVERE (8-10):  Excruciating pain, interferes with most normal activities.   - SEVERE WITH DYSPHAGIA (10): Can't swallow liquids, drooling.     8/10 3. STREP EXPOSURE: Has there been any exposure to strep within the past week? If Yes, ask: What type of contact occurred?      no 4.  VIRAL SYMPTOMS: Are there any symptoms of a cold, such as a runny nose, cough, hoarse voice or red eyes?      Body aches 5. FEVER: Do you have a fever? If Yes, ask: What is your temperature, how was it measured, and when did it start?     Yes thinks so 6. PUS ON THE TONSILS: Is there pus on the tonsils in the back of your throat?     Not looked 7. OTHER SYMPTOMS: Do you have any  other symptoms? (e.g., difficulty breathing, headache, rash)     Cough, BA,  Protocols used: Sore Throat-A-AH

## 2024-01-01 NOTE — Progress Notes (Signed)
Meds ordered this encounter  Medications   predniSONE (DELTASONE) 20 MG tablet    Sig: Take 2 tablets (40 mg total) by mouth daily with breakfast.    Dispense:  10 tablet    Refill:  0

## 2024-01-02 LAB — URINE CULTURE

## 2024-01-04 ENCOUNTER — Other Ambulatory Visit: Payer: Self-pay | Admitting: Family Medicine

## 2024-01-04 ENCOUNTER — Ambulatory Visit: Payer: Self-pay | Admitting: Family Medicine

## 2024-01-04 NOTE — Telephone Encounter (Unsigned)
 Copied from CRM [id number]. Topic: Clinical - Medication Refill >> Jan 04, 2024  4:11 PM Retta Caster wrote: Medication: levothyroxine  (SYNTHROID ) 50 MCG tablet   Has the patient contacted their pharmacy? Yes (Agent: If no, request that the patient contact the pharmacy for the refill. If patient does not wish to contact the pharmacy document the reason why and proceed with request.) (Agent: If yes, when and what did the pharmacy advise?)  This is the patient's preferred pharmacy:  [facility] - [place], [place] - [address] [place] [address] Phone: [phone] Fax: [phone]  Is this the correct pharmacy for this prescription? Yes If no, delete pharmacy and type the correct one.   Has the prescription been filled recently? Yes  Is the patient out of the medication? Yes  Has the patient been seen for an appointment in the last year OR does the patient have an upcoming appointment? Yes  Can we respond through MyChart? Yes  Agent: Please be advised that Rx refills may take up to 3 business days. We ask that you follow-up with your pharmacy.

## 2024-01-04 NOTE — Progress Notes (Signed)
 Hi Kathleen [name], urine culture came back negative.  No sign of active infection which is great news.

## 2024-01-14 DIAGNOSIS — L7211 Pilar cyst: Secondary | ICD-10-CM | POA: Diagnosis not present

## 2024-01-14 DIAGNOSIS — L570 Actinic keratosis: Secondary | ICD-10-CM | POA: Diagnosis not present

## 2024-01-14 DIAGNOSIS — L720 Epidermal cyst: Secondary | ICD-10-CM | POA: Diagnosis not present

## 2024-01-14 DIAGNOSIS — H61031 Chondritis of right external ear: Secondary | ICD-10-CM | POA: Diagnosis not present

## 2024-02-05 ENCOUNTER — Ambulatory Visit: Admitting: Family Medicine

## 2024-02-05 ENCOUNTER — Ambulatory Visit: Admitting: Physician Assistant

## 2024-03-03 ENCOUNTER — Telehealth: Payer: Self-pay

## 2024-03-03 NOTE — Telephone Encounter (Signed)
 Patient showing schduled 08/192025 with DR. Metheney

## 2024-03-03 NOTE — Telephone Encounter (Signed)
 Pt needs appt =- please call her

## 2024-03-03 NOTE — Progress Notes (Signed)
 Pharmacy Quality Measure Review  This patient is appearing on a report for being at risk of failing the adherence measure for cholesterol (statin) medications this calendar year.   Medication: atorvastatin  40 mg daily (taking 0.5 tablet of 80 mg)  Last fill date: 10/08/23 for 90 day supply  Patient was previously on 80 mg per day, however started cutting tablets in half due to fatigue and weakness. Followed up with PCP on 10/08/23 and found that cutting the tablet in half has improved her weakness.   Called patient today, she reports having a surplus of atorvastatin  since she received a prescription for a 90 day supply at the beginning of the year. She does not need a refill of her statin at this time.   During the call, the patient expressed concern about follow-up with her PCP. She states that she does not have any appointments scheduled yet, and would like someone to reach out to her to get her appointment scheduled. Will facilitate to embedded pharmacist to delegate future appointment.   Woodie Jock, PharmD PGY1 Pharmacy Resident

## 2024-03-08 ENCOUNTER — Ambulatory Visit (INDEPENDENT_AMBULATORY_CARE_PROVIDER_SITE_OTHER): Admitting: Family Medicine

## 2024-03-08 ENCOUNTER — Encounter: Payer: Self-pay | Admitting: Family Medicine

## 2024-03-08 VITALS — BP 130/68 | HR 85 | Ht 60.0 in | Wt 124.0 lb

## 2024-03-08 DIAGNOSIS — I10 Essential (primary) hypertension: Secondary | ICD-10-CM | POA: Diagnosis not present

## 2024-03-08 DIAGNOSIS — I5022 Chronic systolic (congestive) heart failure: Secondary | ICD-10-CM | POA: Diagnosis not present

## 2024-03-08 DIAGNOSIS — Z955 Presence of coronary angioplasty implant and graft: Secondary | ICD-10-CM

## 2024-03-08 DIAGNOSIS — R931 Abnormal findings on diagnostic imaging of heart and coronary circulation: Secondary | ICD-10-CM

## 2024-03-08 DIAGNOSIS — R7301 Impaired fasting glucose: Secondary | ICD-10-CM

## 2024-03-08 DIAGNOSIS — E039 Hypothyroidism, unspecified: Secondary | ICD-10-CM

## 2024-03-08 MED ORDER — LOSARTAN POTASSIUM 25 MG PO TABS: 25.0000 mg | ORAL_TABLET | Freq: Every day | ORAL | 1 refills | Status: DC

## 2024-03-08 NOTE — Assessment & Plan Note (Signed)
No sign of volume overload on exam today. °

## 2024-03-08 NOTE — Progress Notes (Signed)
 Established Patient Office Visit  Subjective  Patient ID: Kathleen [name], female    DOB: March 30, 1941  Age: 83 y.o. MRN: [id number]  Chief Complaint  Patient presents with   Hypertension    HPI  Discussed the use of AI scribe software for clinical note transcription with the patient, who gave verbal consent to proceed.  History of Present Illness Kathleen [name] is an 83 year old female who presents with worsening hot flashes and concerns about medication use.  Vasomotor symptoms (hot flashes) - Worsening hot flashes, exacerbated during summer months - Episodes involve profuse sweating and wet hair, occurring both day and night - Lack of air conditioning at home with indoor temperatures around 82-40F increases discomfort - Hot flashes also present during winter when there was no heat in the house  Fatigue and generalized weakness - Significant fatigue and weakness, especially in the legs - Impaired ability to perform daily activities such as cleaning - Difficulty with physical tasks (e.g., cleaning baseboards) resulting in pain and difficulty rising from the floor - Desires increased energy and strength   Musculoskeletal pain - Physical strain from assisting husband with heavy lifting in the garage - Exacerbation of back pain with these activities  Thyroid  medication concerns - Taking Synthroid  for 45 years - Concerned about safety of generic versus brand-name thyroid  medications - Takes thyroid  medication daily, except for a half dose on Sundays  Statin and aspirin  use - Takes statin at half the prescribed dose - Takes aspirin  regularly - Questions whether statin could be contributing to current symptoms      ROS    Objective:     BP 130/68   Pulse 85   Ht 5' (1.524 m)   Wt 124 lb (56.2 kg)   SpO2 100%   BMI 24.22 kg/m    Physical Exam Vitals and nursing note reviewed.  Constitutional:      Appearance: Normal appearance.  HENT:     Head: Normocephalic  and atraumatic.  Eyes:     Conjunctiva/sclera: Conjunctivae normal.  Cardiovascular:     Rate and Rhythm: Normal rate and regular rhythm.  Pulmonary:     Effort: Pulmonary effort is normal.     Breath sounds: Normal breath sounds.  Skin:    General: Skin is warm and dry.  Neurological:     Mental Status: She is alert.  Psychiatric:        Mood and Affect: Mood normal.      No results found for any visits on 03/08/24.    The ASCVD Risk score (Arnett DK, et al., 2019) failed to calculate for the following reasons:   The 2019 ASCVD risk score is only valid for ages 74 to 55   Risk score cannot be calculated because patient has a medical history suggesting prior/existing ASCVD    Assessment & Plan:   Problem List Items Addressed This Visit       Cardiovascular and Mediastinum   Heart failure with mildly reduced ejection fraction (HFmrEF) (HCC)   No sign of volume overload on exam today      Relevant Medications   losartan  (COZAAR ) 25 MG tablet   Essential hypertension - Primary   BP at goal.        Relevant Medications   losartan  (COZAAR ) 25 MG tablet   Other Relevant Orders   TSH   Hemoglobin A1c   CMP14+EGFR   Lipid panel   CBC     Endocrine   Impaired fasting glucose  Due to check A1C.       Relevant Orders   TSH   Hemoglobin A1c   CMP14+EGFR   Lipid panel   CBC   Hypothyroidism   Relevant Orders   TSH   Hemoglobin A1c   CMP14+EGFR   Lipid panel   CBC     Other   S/P coronary artery stent placement   Relevant Orders   TSH   Hemoglobin A1c   CMP14+EGFR   Lipid panel   CBC   Other Visit Diagnoses       Abnormal echocardiogram       Relevant Orders   TSH   Hemoglobin A1c   CMP14+EGFR   Lipid panel   CBC     Primary hypertension       Relevant Medications   losartan  (COZAAR ) 25 MG tablet   Other Relevant Orders   TSH   Hemoglobin A1c   CMP14+EGFR   Lipid panel   CBC      Assessment and Plan Assessment & Plan Hot  flashes in postmenopausal state Hot flashes worsened with minimal exertion, causing discomfort. Non-hormonal treatment options considered. - Discussed starting low dose Effexor (venlafaxine) for management as a non-hormonal option.  Hypothyroidism Long-standing hypothyroidism stable on Synthroid . Concerns about generic Synthroid  addressed, with no recent TSH changes. - Check for recent safety concerns with generic Synthroid . - Continue current Synthroid  regimen: one pill daily except half a pill on Sundays.  Hyperlipidemia Managed with statin, no contribution to hot flashes noted.  Chronic back pain Pain exacerbated by heavy lifting, raising concern for stress fractures. - Advised against heavy lifting to prevent exacerbation and potential stress fractures.  Fatigue and decreased strength Significant fatigue and decreased strength impacting daily activities. Exercise recommendations discussed. - Recommended resistance training for upper and lower body, including walking and hand weights. - Advised daily walking for cardiovascular health and resistance training every other day.  Reminded to schedule AWV with our Medicare nurse.      Return in about 4 months (around 07/08/2024) for Hypertension.   I spent 40 minutes on the day of the encounter to include pre-visit record review, face-to-face time with the patient and post visit ordering of test.   Dorothyann Byars, MD

## 2024-03-08 NOTE — Assessment & Plan Note (Signed)
Due to check A1C

## 2024-03-08 NOTE — Assessment & Plan Note (Signed)
 BP at goal

## 2024-03-09 ENCOUNTER — Ambulatory Visit: Payer: Self-pay | Admitting: Family Medicine

## 2024-03-09 DIAGNOSIS — R944 Abnormal results of kidney function studies: Secondary | ICD-10-CM

## 2024-03-09 LAB — CMP14+EGFR
ALT: 12 IU/L (ref 0–32)
AST: 23 IU/L (ref 0–40)
Albumin: 4.2 g/dL (ref 3.7–4.7)
Alkaline Phosphatase: 97 IU/L (ref 44–121)
BUN/Creatinine Ratio: 23 (ref 12–28)
BUN: 23 mg/dL (ref 8–27)
Bilirubin Total: 0.5 mg/dL (ref 0.0–1.2)
CO2: 24 mmol/L (ref 20–29)
Calcium: 9.6 mg/dL (ref 8.7–10.3)
Chloride: 107 mmol/L — ABNORMAL HIGH (ref 96–106)
Creatinine, Ser: 1.02 mg/dL — ABNORMAL HIGH (ref 0.57–1.00)
Globulin, Total: 2.2 g/dL (ref 1.5–4.5)
Glucose: 103 mg/dL — ABNORMAL HIGH (ref 70–99)
Potassium: 3.7 mmol/L (ref 3.5–5.2)
Sodium: 145 mmol/L — ABNORMAL HIGH (ref 134–144)
Total Protein: 6.4 g/dL (ref 6.0–8.5)
eGFR: 54 mL/min/1.73 — ABNORMAL LOW (ref >59)

## 2024-03-09 LAB — CBC
Hematocrit: 40.7 % (ref 34.0–46.6)
Hemoglobin: 13.4 g/dL (ref 11.1–15.9)
MCH: 31.4 pg (ref 26.6–33.0)
MCHC: 32.9 g/dL (ref 31.5–35.7)
MCV: 95 fL (ref 79–97)
Platelets: 191 x10E3/uL (ref 150–450)
RBC: 4.27 x10E6/uL (ref 3.77–5.28)
RDW: 12.4 % (ref 11.7–15.4)
WBC: 6.8 x10E3/uL (ref 3.4–10.8)

## 2024-03-09 LAB — LIPID PANEL
Chol/HDL Ratio: 2.8 ratio (ref 0.0–4.4)
Cholesterol, Total: 142 mg/dL (ref 100–199)
HDL: 50 mg/dL (ref >39)
LDL Chol Calc (NIH): 76 mg/dL (ref 0–99)
Triglycerides: 84 mg/dL (ref 0–149)
VLDL Cholesterol Cal: 16 mg/dL (ref 5–40)

## 2024-03-09 LAB — TSH: TSH: 3.15 u[IU]/mL (ref 0.450–4.500)

## 2024-03-09 LAB — HEMOGLOBIN A1C
Est. average glucose Bld gHb Est-mCnc: 128 mg/dL
Hgb A1c MFr Bld: 6.1 % — ABNORMAL HIGH (ref 4.8–5.6)

## 2024-03-09 NOTE — Progress Notes (Signed)
 Hi Kathleen [name], A1c is up a little bit at 6.1 but still in the prediabetes range so not worrisome.  Kidney function is up a little bit from baseline normally you are between 0.8-0.9.  But you do look a little better hydrated this time which is reassuring but I want to recheck your kidney function in about 2 to 3 months instead of 6 months.  Sodium level was still slightly elevated.  Liver function normal.  Thyroid  looks good.  Cholesterol looks great.  Blood count is normal no sign of anemia.

## 2024-03-15 ENCOUNTER — Ambulatory Visit: Payer: Self-pay

## 2024-03-15 ENCOUNTER — Telehealth: Payer: Self-pay

## 2024-03-15 ENCOUNTER — Ambulatory Visit: Admitting: Physician Assistant

## 2024-03-15 DIAGNOSIS — L2989 Other pruritus: Secondary | ICD-10-CM | POA: Diagnosis not present

## 2024-03-15 DIAGNOSIS — B029 Zoster without complications: Secondary | ICD-10-CM | POA: Diagnosis not present

## 2024-03-15 DIAGNOSIS — L820 Inflamed seborrheic keratosis: Secondary | ICD-10-CM | POA: Diagnosis not present

## 2024-03-15 DIAGNOSIS — L538 Other specified erythematous conditions: Secondary | ICD-10-CM | POA: Diagnosis not present

## 2024-03-15 NOTE — Telephone Encounter (Signed)
 FYI Only or Action Required?: Action required by provider: request for appointment.  Patient was last seen in primary care on 03/08/2024 by Alvan Dorothyann BIRCH, MD.  Called Nurse Triage reporting Herpes Zoster.  Symptoms began unsure.  Interventions attempted: Other: unsure.  Symptoms are: unsure.  Triage Disposition: No disposition on file.  Patient/caregiver understands and will follow disposition?: Unsure                     Copied from CRM [id number]. Topic: Clinical - Red Word Triage >> Mar 15, 2024  9:41 AM Marda MATSU wrote: Red Word that prompted transfer to Nurse Triage: itching burning and painful, rash. Question shingles Answer Assessment - Initial Assessment Questions Pt called from parking lot of dermatology office. Pt was told that she has shingles at derm. Pt is very upset and wants to be seen today. No appts available. Call transferred to CAL  - Pt transferred to [facility] for assistance.       1. APPEARANCE of RASH: What does the rash look like?      Pt was seen at  dermatology and told she may have shingles.  Protocols used: Shingles (Zoster)-A-AH

## 2024-03-15 NOTE — Telephone Encounter (Signed)
 Patient scheduled for 11:30 03/16/24 with Dr. Alvan

## 2024-03-15 NOTE — Telephone Encounter (Signed)
 I am happy to look at it tomorrow as a double book if she would like. Hopfully they sent an antiviral in for her

## 2024-03-15 NOTE — Telephone Encounter (Signed)
 Patient called - crying on phone States she is sitting outside of Dermatology office. She states Dermatologist had just told her that she could possibly have shingles and feels overwhelmed. States she does not need this right now with hx of skin cancner.  She had wanted to have Dr. Alvan to look at the site today to get her opinion - I did inform her that we did not have any openings today but that I would send DR. Metheney a message and let her know everything we spoke about she states  She just wanted to know if what the dermatologist is doing to treat this would be what Dr. Alvan would do  and whether she felt the shingles dx was accurate.  States the dermatologist called in a medication and was going to give her a topical medication as well.  Dermatology requesting a follow up in 2 weeks .  Patient requesting a return call on house phone # [phone] and o.k. to leave a detailed voicemail message per patient.

## 2024-03-16 ENCOUNTER — Ambulatory Visit (INDEPENDENT_AMBULATORY_CARE_PROVIDER_SITE_OTHER): Admitting: Family Medicine

## 2024-03-16 ENCOUNTER — Encounter: Payer: Self-pay | Admitting: Family Medicine

## 2024-03-16 VITALS — BP 136/85 | HR 87 | Ht 60.0 in | Wt 124.0 lb

## 2024-03-16 DIAGNOSIS — B029 Zoster without complications: Secondary | ICD-10-CM | POA: Diagnosis not present

## 2024-03-16 MED ORDER — VALACYCLOVIR HCL 500 MG PO TABS: 1000.0000 mg | ORAL_TABLET | Freq: Three times a day (TID) | ORAL | 0 refills | Status: DC

## 2024-03-16 NOTE — Progress Notes (Addendum)
   Acute Office Visit  Subjective:     Patient ID: Kathleen [name], female    DOB: [birth year], 83 y.o.   MRN: [id number]  Chief Complaint  Patient presents with   Herpes Zoster    Pt reports that she was seen by derm and was told that she has shingles    HPI Patient is in today for rash that started about 7 days ago..  She had reached out to us  because she did see the dermatologist yesterday and they felt that her rash was consistent with shingles they sent in a topical cream as well as valacyclovir .  She had not picked up the prescriptions yet as she wanted to come in and have me look at it today.  She has been under a lot of stress recently.  She has been putting some Aquaphor on it she says it actually looks a little bit better today.  It has been itchy but very painful and sensitive  ROS  Medications reviewed.      Objective:    BP 136/85   Pulse 87   Ht 5' (1.524 m)   Wt 124 lb (56.2 kg)   SpO2 98%   BMI 24.22 kg/m    Physical Exam Skin:    Comments: Erythematous maculopapular rash on her right mid back in the bra strap area with the rash going horizontal.  No vesicles on exam today.  The skin is a dark dusky pink.  It is well-demarcated.     No results found for any visits on 03/16/24.      Assessment & Plan:   Problem List Items Addressed This Visit   None Visit Diagnoses       Herpes zoster without complication    -  Primary   Relevant Medications   valACYclovir  (VALTREX ) 500 MG tablet      Most consistent with shingles did encourage her to start valacyclovir  the 1000 mg dose was the most $50 so we will send over a new prescription so she can take 2 of the 500s that should be less expensive overall.  Recommend topical lidocaine  cream gel or either patches.  Avoid being around people who are pregnant.  Wash hands after applying creams etc.  Call if not healing over the next 2 weeks.  Meds ordered this encounter  Medications   valACYclovir  (VALTREX ) 500 MG  tablet    Sig: Take 2 tablets (1,000 mg total) by mouth 3 (three) times daily for 7 days. For shingles    Dispense:  42 tablet    Refill:  0    Please cancel the 1000mg  dose.    No follow-ups on file.  Dorothyann Byars, MD

## 2024-03-16 NOTE — Addendum Note (Signed)
 Addended by: Amandeep Nesmith D on: 03/16/2024 01:51 PM   Modules accepted: Level of Service

## 2024-03-22 ENCOUNTER — Encounter: Payer: Self-pay | Admitting: Sports Medicine

## 2024-03-23 ENCOUNTER — Ambulatory Visit: Attending: Cardiovascular Disease | Admitting: Cardiovascular Disease

## 2024-03-23 ENCOUNTER — Encounter: Payer: Self-pay | Admitting: Cardiovascular Disease

## 2024-03-23 VITALS — BP 114/84 | HR 77 | Ht 60.0 in | Wt 122.8 lb

## 2024-03-23 DIAGNOSIS — I10 Essential (primary) hypertension: Secondary | ICD-10-CM | POA: Diagnosis not present

## 2024-03-23 DIAGNOSIS — I3139 Other pericardial effusion (noninflammatory): Secondary | ICD-10-CM | POA: Diagnosis not present

## 2024-03-23 DIAGNOSIS — I25119 Atherosclerotic heart disease of native coronary artery with unspecified angina pectoris: Secondary | ICD-10-CM

## 2024-03-23 DIAGNOSIS — E785 Hyperlipidemia, unspecified: Secondary | ICD-10-CM | POA: Diagnosis not present

## 2024-03-23 NOTE — Assessment & Plan Note (Signed)
 Currently off of medication. BP under very good control.

## 2024-03-23 NOTE — Assessment & Plan Note (Signed)
 Stable, atypical symptoms. Continue aspirin  and rosuvastatin.

## 2024-03-23 NOTE — Patient Instructions (Addendum)
 Medication Instructions:  Your physician recommends that you continue on your current medications as directed. Please refer to the Current Medication list given to you today.  *If you need a refill on your cardiac medications before your next appointment, please call your pharmacy*  Lab Work: None ordered.  You may go to any [facility] Location for your lab work:  [facility] - [address] ([facility]) - [address] [id number] [address]  [place] - [address]   [place]  - [address]   [place] - [address] - [address] [employer]  - [address] ([facility]   If you have labs (blood work) drawn today and your tests are completely normal, you will receive your results only by: [employer] (if you have MyChart)  If you have any lab test that is abnormal or we need to change your treatment, we will call you or send a MyChart message to review the results.  Testing/Procedures: Your physician has requested that you have an echocardiogram. Echocardiography is a painless test that uses sound waves to create images of your heart. It provides your doctor with information about the size and shape of your heart and how well your heart's chambers and valves are working. This procedure takes approximately one hour. There are no restrictions for this procedure. Please do NOT wear cologne, perfume, aftershave, or lotions (deodorant is allowed). Please arrive 15 minutes prior to your appointment time.  Please note: We ask at that you not bring children with you during ultrasound (echo/ vascular) testing. Due to room size and safety concerns, children are not allowed in the ultrasound rooms during exams. Our front office staff cannot provide observation of children in our lobby area while testing is being conducted. An adult accompanying a patient  to their appointment will only be allowed in the ultrasound room at the discretion of the ultrasound technician under special circumstances. We apologize for any inconvenience.   Follow-Up: At [facility], you and your health needs are our priority.  As part of our continuing mission to provide you with exceptional heart care, we have created designated Provider Care Teams.  These Care Teams include your primary Cardiologist (physician) and Advanced Practice Providers (APPs -  Physician Assistants and Nurse Practitioners) who all work together to provide you with the care you need, when you need it.   Your next appointment:   6 months  The format for your next appointment:   In Person  Provider:   Ozell Fell, MD

## 2024-03-23 NOTE — Progress Notes (Signed)
 Cardiology Office Note:    Date:  03/23/2024   ID:  Kathleen [name], DOB 10-13-1940, MRN [id number]  PCP:  Alvan Dorothyann BIRCH, MD   [facility] Providers Cardiologist:  Ozell Fell, MD     Referring MD: Alvan Dorothyann BIRCH, *   Chief Complaint  Patient presents with   Coronary Artery Disease    History of Present Illness:    Kathleen [name] is a 83 y.o. female  presenting for follow-up evaluation. She has a history of coronary artery disease, pericardial effusion, and mixed hyperlipidemia, presenting for follow-up evaluation. She has had a moderate pericardial effusion and has not required intervention. LVEF has been reduced at 40 to 45% with hypokinesis of the apical anterior wall. She was found to have severe proximal LAD stenosis by coronary CTA in July 2023 and because of symptoms of marked fatigue and mild exertional dyspnea, was ultimately referred for cardiac catheterization and treated with a DES in the LAD.   The patient is here alone today. She's been under a lot of stress, recently had shingles affecting the left side of her back. She feels like she's doing better in this regard. She has occasional chest pain, mostly when lying on her left side. No exertional symptoms. Able to walk without chest pain or tightness. No shortness of breath, edema, orthopnea, or PND.    Current Medications: Current Meds  Medication Sig   aspirin  EC 81 MG tablet Take 81 mg by mouth at bedtime.   atorvastatin  (LIPITOR ) 80 MG tablet Take 0.5 tablets (40 mg total) by mouth at bedtime.   Cholecalciferol (VITAMIN D ) 125 MCG (5000 UT) CAPS Take 5,000 Units by mouth daily.   Ferrous Sulfate (IRON PO) Take 65 mg by mouth daily.   furosemide  (LASIX ) 20 MG tablet Take 1 tablet (20 mg total) by mouth daily as needed.   levothyroxine  (SYNTHROID ) 50 MCG tablet TAKE 1 TABLET (50 MCG TOTAL) BY MOUTH DAILY BEFORE BREAKFAST.   MAGNESIUM PO Take 1 tablet by mouth daily.   OVER THE COUNTER MEDICATION  Take 2 capsules by mouth 3 (three) times daily. Balance of Nature capsules (vegetables and fruits)   potassium chloride  (KLOR-CON  M) 10 MEQ tablet Take 1 tablet (10 mEq total) by mouth daily as needed.   TURMERIC PO Take 1 capsule by mouth daily.   valACYclovir  (VALTREX ) 500 MG tablet Take 2 tablets (1,000 mg total) by mouth 3 (three) times daily for 7 days. For shingles   Zinc Sulfate (ZINC 15 PO) Take 1 tablet by mouth daily.   [DISCONTINUED] fluticasone  (FLONASE ) 50 MCG/ACT nasal spray Place 2 sprays into both nostrils daily.     Allergies:   Streptomycin, Amlodipine , Codeine, Elemental sulfur, Penicillins, Sulfa antibiotics, Sulfonamide derivatives, Sulfur, Benicar  [olmesartan ], Hctz [hydrochlorothiazide], and Lisinopril    ROS:   Please see the history of present illness.    All other systems reviewed and are negative.  EKGs/Labs/Other Studies Reviewed:    The following studies were reviewed today: Cardiac Studies & Procedures   ______________________________________________________________________________________________ CARDIAC CATHETERIZATION  CARDIAC CATHETERIZATION 02/10/2022  Conclusion   Ramus lesion is 40% stenosed.   Prox LAD to Mid LAD lesion is 95% stenosed.   Mid LAD to Dist LAD lesion is 50% stenosed.   A drug-eluting stent was successfully placed using a SYNERGY XD 2.50X32.   Post intervention, there is a 0% residual stenosis.  1.  Severe proximal LAD stenosis, treated successfully with PCI using a 2.5 x 32 mm Synergy DES 2.  Widely  patent, dominant left circumflex with no significant stenosis 3.  Patent, nondominant RCA (small vessel) 4.  Essentially normal right heart hemodynamics, normal LVEDP, normal wedge pressure, with no hemodynamic evidence of cardiac tamponade  Recommendations: Aspirin  and clopidogrel  at least 6 months, aggressive risk reduction measures, overnight observation with plans for discharge tomorrow as long as no complications  arise.  Findings Coronary Findings Diagnostic  Dominance: Left  Left Anterior Descending Prox LAD to Mid LAD lesion is 95% stenosed. The lesion is moderately calcified. The LAD has a tight proximal stenosis of 95%.  The remainder of the vessel is underfilled and diffusely diseased.  The diagonal branches are small in caliber. Mid LAD to Dist LAD lesion is 50% stenosed. Diffuse mid to distal LAD stenosis is present  Ramus Intermedius Ramus lesion is 40% stenosed. The ramus branch covers a fairly large territory.  There is mild nonobstructive plaquing in the proximal vessel at the origin of a small perforating branch.  The perforating branch has an 80% ostial stenosis  Left Circumflex Vessel is large. The vessel exhibits minimal luminal irregularities. Large vessel, mild nonobstructive plaquing noted with no significant stenosis.  The branch vessels of the circumflex are patent.  The circumflex is dominant.  Right Coronary Artery There is mild diffuse disease throughout the vessel. Small, nondominant vessel with no significant stenosis.  Intervention  Prox LAD to Mid LAD lesion Stent CATH VISTA GUIDE 6FR XBLAD3.5 guide catheter was inserted. Lesion crossed with guidewire using a WIRE COUGAR XT STRL 190CM. Pre-stent angioplasty was performed. A drug-eluting stent was successfully placed using a SYNERGY XD 2.50X32. Post-stent angioplasty was performed using a BALL SAPPHIRE NC24 2.75X22. Maximum pressure:  16 atm. Initially, the vessel is underfilled.  Following stenting there is improved distal flow.  The distal vessel is diffusely diseased and the stent is landed in a healthy area of the vessel.  All of the branch vessels remain patent. Post-Intervention Lesion Assessment The intervention was successful. Pre-interventional TIMI flow is 3. Post-intervention TIMI flow is 3. No complications occurred at this lesion. There is a 0% residual stenosis post intervention.      ECHOCARDIOGRAM  ECHOCARDIOGRAM COMPLETE 03/09/2023  Narrative ECHOCARDIOGRAM REPORT    Patient Name:   Kathleen [name] Date of Exam: 03/09/2023 Medical Rec #:  [id number]    Height:       60.0 in Accession #:    [id number]   Weight:       117.5 lb Date of Birth:  Mar 22, 1941    BSA:          1.489 m Patient Age:    82 years     BP:           111/79 mmHg Patient Gender: F            HR:           74 bpm. Exam Location:  Inpatient  Procedure: 2D Echo, Color Doppler and Cardiac Doppler  Indications:    Chest Pain  History:        Patient has prior history of Echocardiogram examinations, most recent 08/12/2022. CAD and Pericardial Effusion; Risk Factors:Hypertension and Dyslipidemia.  Sonographer:    Logan Shove RDCS Referring Phys: [id number] MARSA NOVAK MELVIN  IMPRESSIONS   1. Akinesis of the distal septum and apex; overall mild LV dysfunction. 2. Left ventricular ejection fraction, by estimation, is 45 to 50%. The left ventricle has mildly decreased function. The left ventricle demonstrates regional wall motion abnormalities (see scoring diagram/findings for  description). Left ventricular diastolic parameters are indeterminate. 3. Right ventricular systolic function is normal. The right ventricular size is normal. 4. A small pericardial effusion is present. 5. The mitral valve is normal in structure. Trivial mitral valve regurgitation. No evidence of mitral stenosis. 6. The aortic valve is tricuspid. Aortic valve regurgitation is trivial. Aortic valve sclerosis is present, with no evidence of aortic valve stenosis. 7. The inferior vena cava is normal in size with greater than 50% respiratory variability, suggesting right atrial pressure of 3 mmHg.  FINDINGS Left Ventricle: Left ventricular ejection fraction, by estimation, is 45 to 50%. The left ventricle has mildly decreased function. The left ventricle demonstrates regional wall motion abnormalities. The left ventricular internal  cavity size was normal in size. There is no left ventricular hypertrophy. Left ventricular diastolic parameters are indeterminate.  Right Ventricle: The right ventricular size is normal. Right ventricular systolic function is normal.  Left Atrium: Left atrial size was normal in size.  Right Atrium: Right atrial size was normal in size.  Pericardium: A small pericardial effusion is present.  Mitral Valve: The mitral valve is normal in structure. Trivial mitral valve regurgitation. No evidence of mitral valve stenosis. MV peak gradient, 8.1 mmHg. The mean mitral valve gradient is 2.0 mmHg.  Tricuspid Valve: The tricuspid valve is normal in structure. Tricuspid valve regurgitation is mild . No evidence of tricuspid stenosis.  Aortic Valve: The aortic valve is tricuspid. Aortic valve regurgitation is trivial. Aortic valve sclerosis is present, with no evidence of aortic valve stenosis. Aortic valve mean gradient measures 3.0 mmHg. Aortic valve peak gradient measures 5.5 mmHg. Aortic valve area, by VTI measures 2.16 cm.  Pulmonic Valve: The pulmonic valve was normal in structure. Pulmonic valve regurgitation is trivial. No evidence of pulmonic stenosis.  Aorta: The aortic root is normal in size and structure.  Venous: The inferior vena cava is normal in size with greater than 50% respiratory variability, suggesting right atrial pressure of 3 mmHg.  IAS/Shunts: No atrial level shunt detected by color flow Doppler.  Additional Comments: Akinesis of the distal septum and apex; overall mild LV dysfunction.   LEFT VENTRICLE PLAX 2D LVIDd:         3.90 cm     Diastology LVIDs:         3.10 cm     LV e' medial:    5.00 cm/s LV PW:         1.10 cm     LV E/e' medial:  24.6 LV IVS:        1.10 cm     LV e' lateral:   6.96 cm/s LVOT diam:     2.00 cm     LV E/e' lateral: 17.7 LV SV:         54 LV SV Index:   36 LVOT Area:     3.14 cm  LV Volumes (MOD) LV vol d, MOD A2C: 97.5 ml LV vol d,  MOD A4C: 97.6 ml LV vol s, MOD A2C: 54.3 ml LV vol s, MOD A4C: 56.2 ml LV SV MOD A2C:     43.2 ml LV SV MOD A4C:     97.6 ml LV SV MOD BP:      44.5 ml  RIGHT VENTRICLE             IVC RV Basal diam:  2.70 cm     IVC diam: 1.10 cm RV S prime:     11.40 cm/s TAPSE (M-mode): 2.7 cm  LEFT ATRIUM             Index        RIGHT ATRIUM           Index LA diam:        2.00 cm 1.34 cm/m   RA Area:     10.60 cm LA Vol (A2C):   26.9 ml 18.07 ml/m  RA Volume:   19.20 ml  12.89 ml/m LA Vol (A4C):   28.8 ml 19.34 ml/m LA Biplane Vol: 29.3 ml 19.68 ml/m AORTIC VALVE AV Area (Vmax):    2.15 cm AV Area (Vmean):   2.12 cm AV Area (VTI):     2.16 cm AV Vmax:           117.00 cm/s AV Vmean:          81.400 cm/s AV VTI:            0.249 m AV Peak Grad:      5.5 mmHg AV Mean Grad:      3.0 mmHg LVOT Vmax:         80.20 cm/s LVOT Vmean:        54.900 cm/s LVOT VTI:          0.171 m LVOT/AV VTI ratio: 0.69  AORTA Ao Root diam: 2.80 cm  MITRAL VALVE                TRICUSPID VALVE MV Area (PHT): 5.75 cm     TR Peak grad:   16.5 mmHg MV Area VTI:   2.11 cm     TR Vmax:        203.00 cm/s MV Peak grad:  8.1 mmHg MV Mean grad:  2.0 mmHg     SHUNTS MV Vmax:       1.42 m/s     Systemic VTI:  0.17 m MV Vmean:      64.7 cm/s    Systemic Diam: 2.00 cm MV Decel Time: 132 msec MV E velocity: 123.00 cm/s  Redell Shallow MD Electronically signed by Redell Shallow MD Signature Date/Time: 03/09/2023/12:01:49 PM    Final      CT SCANS  CT CORONARY MORPH W/CTA COR W/SCORE 01/28/2022  Addendum 01/28/2022  4:29 PM ADDENDUM REPORT: 01/28/2022 16:27  ADDENDUM: OVER-READ INTERPRETATION  CT CHEST  The following report is an over-read performed by radiologist Dr. Isla Qua [facility] on 01/28/2022. This over-read does not include interpretation of cardiac or coronary anatomy or pathology. The coronary calcium  score and coronary CT angiography. Interpretation by the  cardiologist is attached. Imaging of the chest is focused on cardiac structures and excludes much of the chest on CT.  COMPARISON:  None.  FINDINGS:  Cardiovascular: Signs of pericardial effusion, small to moderate. Aortic atherosclerosis. See dedicated cardiac imaging report for additional details.  Mediastinum/Nodes: No acute process or signs of adenopathy in the mediastinum.  Lungs/Pleura: Basilar atelectasis. No effusion. No consolidation. Airways to the extent visualized are patent.  Upper Abdomen: No acute process in the upper abdomen.  Musculoskeletal: Spinal degenerative changes without acute or destructive bone finding.  IMPRESSION:  1. Signs of pericardial effusion, small to moderate. 2. Aortic atherosclerosis.  Aortic Atherosclerosis (ICD10-I70.0).   Electronically Signed By: Isla Blind M.D. On: 01/28/2022 16:27  Narrative CLINICAL DATA:  54F with systolic dysfunction on echo (EF 40-45%)  EXAM: Cardiac/Coronary CTA  TECHNIQUE: The patient was scanned on a [employer].  FINDINGS: A 100 kV prospective scan was  triggered in the descending thoracic aorta at 111 HU's. Axial non-contrast 3 mm slices were carried out through the heart. The data set was analyzed on a dedicated work station and scored using the Agatson method. Gantry rotation speed was 250 msecs and collimation was .6 mm. 0.8 mg of sl NTG was given. The 3D data set was reconstructed in 5% intervals of the 35-75 % of the R-R cycle. Phases were analyzed on a dedicated work station using MPR, MIP and VRT modes. The patient received 80 cc of contrast.  Coronary Arteries:  Normal coronary origin.  Left dominance.  RCA is small and nondominant  Left main is a large artery that gives rise to LAD and LCX arteries.  LAD is a large vessel. Mixed plaque in proximal LAD causes severe (70-99%) stenosis.  LCX is a dominant artery that gives rise to one large OM1 branch. Calcified  plaque in proximal LCX causes 0-24% stenosis. Calcified plaque in OM1 causes 0-24% stenosis. Distal LCX uninterpretable due to artifact from PVC during acquisition.  Other findings:  Left Ventricle: Normal size  Left Atrium: Normal size. PFO  Pulmonary Veins: Normal configuration  Right Ventricle: Normal size  Right Atrium: Normal size  Cardiac valves: Mild MAC  Thoracic aorta: Normal size  Pulmonary Arteries: Normal size  Systemic Veins: Normal drainage  Pericardium: Moderate effusion measuring up to 1.5cm adjacent to right atrium  IMPRESSION: 1. Obstructive CAD, with mixed plaque in the proximal LAD causing severe (70-99%) stenosis. Cardiac catheterization recommended  2. Coronary calcium  score of 300. This was 67th percentile for age and sex matched control.  3.  Normal coronary origins with left dominance  4. Artifact due to PVC during acquisition. With ECG editing, coronary arteries are interpretable except for distal LCX  5. Moderate pericardial effusion measuring up to 1.5cm adjacent to right atrium  6.  PFO  CAD-RADS 4 Severe stenosis. (70-99% or > 50% left main). Cardiac catheterization is recommended. Consider symptom-guided anti-ischemic pharmacotherapy as well as risk factor modification per guideline directed care.  Electronically Signed: By: Lonni Nanas M.D. On: 01/28/2022 16:03     ______________________________________________________________________________________________      EKG:   EKG Interpretation Date/Time:  Wednesday March 23 2024 15:24:50 EDT Ventricular Rate:  77 PR Interval:  168 QRS Duration:  80 QT Interval:  364 QTC Calculation: 411 R Axis:   -24  Text Interpretation: Sinus rhythm with frequent Premature ventricular complexes Low voltage QRS When compared with ECG of 22-May-2023 13:54, PREVIOUS ECG IS PRESENT No significant change was found Confirmed by Wonda Sharper [phone]) on 03/23/2024 3:29:31 PM     Recent Labs: 06/22/2023: BNP 252.6 03/08/2024: ALT 12; BUN 23; Creatinine, Ser 1.02; Hemoglobin 13.4; Platelets 191; Potassium 3.7; Sodium 145; TSH 3.150  Recent Lipid Panel    Component Value Date/Time   CHOL 142 03/08/2024 1103   TRIG 84 03/08/2024 1103   TRIG 79 07/25/2009 0000   HDL 50 03/08/2024 1103   CHOLHDL 2.8 03/08/2024 1103   CHOLHDL 2.6 03/09/2023 0139   VLDL 10 03/09/2023 0139   LDLCALC 76 03/08/2024 1103   LDLCALC 66 02/03/2023 1114     Risk Assessment/Calculations:                Physical Exam:    VS:  BP 114/84   Pulse 77   Ht 5' (1.524 m)   Wt 122 lb 12.8 oz (55.7 kg)   SpO2 97%   BMI 23.98 kg/m     Wt Readings  from Last 3 Encounters:  03/23/24 122 lb 12.8 oz (55.7 kg)  03/16/24 124 lb (56.2 kg)  03/08/24 124 lb (56.2 kg)     GEN:  Well nourished, well developed pleasant elderly woman in no acute distress HEENT: Normal NECK: No JVD; No carotid bruits LYMPHATICS: No lymphadenopathy CARDIAC: RRR, no murmurs, rubs, gallops RESPIRATORY:  Clear to auscultation without rales, wheezing or rhonchi  ABDOMEN: Soft, non-tender, non-distended MUSCULOSKELETAL:  No edema; No deformity  SKIN: Warm and dry NEUROLOGIC:  Alert and oriented x 3 PSYCHIATRIC:  Normal affect   Assessment & Plan Essential hypertension Currently off of medication. BP under very good control.  Coronary artery disease involving native coronary artery of native heart with angina pectoris (HCC) Stable, atypical symptoms. Continue aspirin  and rosuvastatin.  Pericardial effusion Recommend repeat limited echo study to assess pericardial effusion.  Has been small to moderate over the past 2 years. Hyperlipidemia LDL goal <70 Treated with atorvastatin . LDL 76. Continue current rx.             Medication Adjustments/Labs and Tests Ordered: Current medicines are reviewed at length with the patient today.  Concerns regarding medicines are outlined above.  Orders Placed This  Encounter  Procedures   EKG 12-Lead   No orders of the defined types were placed in this encounter.   Patient Instructions  Medication Instructions:  Your physician recommends that you continue on your current medications as directed. Please refer to the Current Medication list given to you today.  *If you need a refill on your cardiac medications before your next appointment, please call your pharmacy*  Lab Work: None ordered.  You may go to any [facility] Location for your lab work:  [facility] - [address] ([facility]) - [address] [id number] [address]  [place] - [address]   [place]  - [address]   [place] - [address] - [address] [employer]  - [address] ([facility]   If you have labs (blood work) drawn today and your tests are completely normal, you will receive your results only by: [employer] (if you have MyChart)  If you have any lab test that is abnormal or we need to change your treatment, we will call you or send a MyChart message to review the results.  Testing/Procedures: None ordered.  Follow-Up: At [facility], you and your health needs are our priority.  As part of our continuing mission to provide you with exceptional heart care, we have created designated Provider Care Teams.  These Care Teams include your primary Cardiologist (physician) and Advanced Practice Providers (APPs -  Physician Assistants and Nurse Practitioners) who all work together to provide you with the care you need, when you need it.  We recommend signing up for the patient portal called MyChart.  Sign up information is provided on this After Visit Summary.  MyChart is used to connect with patients for Virtual Visits (Telemedicine).  Patients are able to view lab/test results, encounter notes, upcoming appointments, etc.   Non-urgent messages can be sent to your provider as well.   To learn more about what you can do with MyChart, go to [email or web address].    Your next appointment:   1 year(s)  The format for your next appointment:   In Person  Provider:   Donnice Primus,  MD or one of the following Advanced Practice Providers on your designated Care Team:   Charlies Arthur, PA-C Ozell Jodie Passey, [place] Leotis Barrack, NP  Note: Remote monitoring is used to monitor your Pacemaker/ ICD from home. This monitoring reduces the number of office visits required to check your device to one time per year. It allows us  to keep an eye on the functioning of your device to ensure it is working properly.          Signed, Ozell Fell, MD  03/23/2024 3:43 PM    [facility]

## 2024-04-12 ENCOUNTER — Ambulatory Visit (INDEPENDENT_AMBULATORY_CARE_PROVIDER_SITE_OTHER)

## 2024-04-12 DIAGNOSIS — Z23 Encounter for immunization: Secondary | ICD-10-CM | POA: Diagnosis not present

## 2024-04-12 NOTE — Progress Notes (Signed)
 Pt here for HD flu vaccine. Denies fever, chills, SOB. Pt received  vaccine in LD tolerated well. No redness or swelling noted at the site.

## 2024-04-12 NOTE — Addendum Note (Signed)
 Addended byBETHA DUWAINE RIGGS on: 04/12/2024 02:13 PM   Modules accepted: Level of Service

## 2024-04-25 ENCOUNTER — Ambulatory Visit (HOSPITAL_COMMUNITY)
Admission: RE | Admit: 2024-04-25 | Discharge: 2024-04-25 | Disposition: A | Discharge status: Home / Self Care | Source: Ambulatory Visit | Attending: Internal Medicine | Admitting: Internal Medicine

## 2024-04-25 DIAGNOSIS — I3139 Other pericardial effusion (noninflammatory): Secondary | ICD-10-CM | POA: Diagnosis not present

## 2024-04-25 LAB — ECHOCARDIOGRAM LIMITED
Area-P 1/2: 3.42 cm2
S' Lateral: 2.5 cm

## 2024-04-26 ENCOUNTER — Ambulatory Visit: Payer: Self-pay | Admitting: Cardiovascular Disease

## 2024-05-06 ENCOUNTER — Telehealth: Payer: Self-pay | Admitting: Cardiovascular Disease

## 2024-05-06 NOTE — Telephone Encounter (Signed)
Pt calling to f/u; please advise

## 2024-05-06 NOTE — Telephone Encounter (Signed)
 Patient identification verified by 2 forms.   Ozell Fell, MD 04/26/2024  6:34 AM EDT     Excellent result. Cardiac function improved. LV function is now normal. There is no significant valvular disease.      Spoke with pt regarding her results of limited echo. Advised pt of Dr. Margurite review of echo. Pt is particularly concerned with pericardial effusion. Effusion appears unchanged and was noted by Dr. Fell in most recent office visit (03/23/24) as being consistently there for the last 2 years. Pt states that she was told that she would need to follow up with Dr. Fell in 6 months. Wife would like appointments to be made for her and her husband at the same time for convenience. Pt and her husband scheduled to see Dr. Fell in February. Pt verbalizes understanding.

## 2024-05-06 NOTE — Telephone Encounter (Signed)
 Left message for patient to call back

## 2024-05-06 NOTE — Telephone Encounter (Signed)
Patient calling in about her results. Please advise

## 2024-05-17 ENCOUNTER — Telehealth: Payer: Self-pay | Admitting: Family Medicine

## 2024-05-17 ENCOUNTER — Other Ambulatory Visit: Payer: Self-pay | Admitting: Family Medicine

## 2024-05-17 NOTE — Telephone Encounter (Signed)
 Copied from CRM [id number]. Topic: Clinical - Medication Refill >> May 17, 2024  3:58 PM Zebedee SAUNDERS wrote: Medication: Voltaren and ibuprofen   Has the patient contacted their pharmacy? Yes (Agent: If no, request that the patient contact the pharmacy for the refill. If patient does not wish to contact the pharmacy document the reason why and proceed with request.) (Agent: If yes, when and what did the pharmacy advise?)  This is the patient's preferred pharmacy:  [facility] [place], [place] - [address] [place] [address] Phone: [phone] Fax: [phone]  Is this the correct pharmacy for this prescription? Yes If no, delete pharmacy and type the correct one.   Has the prescription been filled recently? Yes  Is the patient out of the medication? Yes  Has the patient been seen for an appointment in the last year OR does the patient have an upcoming appointment? Yes  Can we respond through MyChart? Yes  Agent: Please be advised that Rx refills may take up to 3 business days. We ask that you follow-up with your pharmacy.

## 2024-05-17 NOTE — Telephone Encounter (Signed)
 Copied from CRM [id number]. Topic: Clinical - Medication Refill >> May 17, 2024  3:58 PM Zebedee SAUNDERS wrote: Medication: Voltaren and ibuprofen    Has the patient contacted their pharmacy? Yes (Agent: If no, request that the patient contact the pharmacy for the refill. If patient does not wish to contact the pharmacy document the reason why and proceed with request.) (Agent: If yes, when and what did the pharmacy advise?)   This is the patient's preferred pharmacy:  [facility] [place], [place] - [address] [place]  [address] Phone: [phone] Fax: [phone]

## 2024-05-17 NOTE — Telephone Encounter (Signed)
 Patient requesting two medication not on med list - Voltaren and ibuprofen  Please see attached note from patient.  Last OV 08/27/225 Upcoming appt 07/05/2024  Attempted call to patient to get more information  No answer no voicemail - could not leave a voice mail message.

## 2024-05-17 NOTE — Telephone Encounter (Signed)
 Yes, please clarify with patient if she talking about Voltaren gel which is now over-the-counter or Voltaren tabs.  If we do the tablets since she cannot also take ibuprofen  she would have to pick 1 or the other.

## 2024-05-17 NOTE — Telephone Encounter (Signed)
 Copied from CRM [id number]. Topic: Clinical - Prescription Issue >> May 17, 2024  3:53 PM Zebedee SAUNDERS wrote: Reason for CRM: Pt would like someone to call her back regarding pain medication and eye medication. Pt does not know names of medications. Please call pt at [phone].

## 2024-05-18 ENCOUNTER — Other Ambulatory Visit: Payer: Self-pay

## 2024-05-18 MED ORDER — IBUPROFEN 800 MG PO TABS: 800.0000 mg | ORAL_TABLET | Freq: Every evening | ORAL | 1 refills | Status: AC | PRN

## 2024-05-18 MED ORDER — VALACYCLOVIR HCL 500 MG PO TABS: 1000.0000 mg | ORAL_TABLET | Freq: Every day | ORAL | 3 refills | Status: AC

## 2024-05-18 NOTE — Telephone Encounter (Signed)
 Spoke with patient  She states the medication requested  was not Voltaren but  Valacyclovir  500mg  once daily for  prevention of reoccurance of shingles in her eye.  She is also requesting Ibuprofen  to use only occasionally at nighttime to help her sleep  due to body pains -recently  after working or raking in yard she has had some back pain and taking ibuprofen  at night helps her to sleep   Patient does have Voltaren Gel at home but is not her medication it is Wayne's - was prescribed in error as Lemond has a severe allergy to aspirin .

## 2024-05-18 NOTE — Telephone Encounter (Signed)
 Patient informed that prescriptions requested have been sent to the pharmacy  Pharmacy informed to cancel any old valacyclovir  prescriptions .

## 2024-05-18 NOTE — Telephone Encounter (Signed)
 Meds ordered this encounter  Medications   ibuprofen  (ADVIL ) 800 MG tablet    Sig: Take 1 tablet (800 mg total) by mouth at bedtime as needed.    Dispense:  30 tablet    Refill:  1   valACYclovir  (VALTREX ) 500 MG tablet    Sig: Take 2 tablets (1,000 mg total) by mouth daily. For shingles    Dispense:  90 tablet    Refill:  3    Please cancel the 1000mg  dose.

## 2024-05-18 NOTE — Telephone Encounter (Signed)
 Spoke with patient. Documented in a separate message.

## 2024-05-19 NOTE — Telephone Encounter (Signed)
 This request has been resolved in a separate message . This is a duplicate message.

## 2024-05-23 ENCOUNTER — Encounter: Payer: Self-pay | Admitting: Radiology

## 2024-07-05 ENCOUNTER — Encounter: Payer: Self-pay | Admitting: Family Medicine

## 2024-07-05 ENCOUNTER — Ambulatory Visit (INDEPENDENT_AMBULATORY_CARE_PROVIDER_SITE_OTHER): Admitting: Family Medicine

## 2024-07-05 VITALS — BP 132/80 | HR 83 | Ht 60.0 in | Wt 124.1 lb

## 2024-07-05 DIAGNOSIS — I10 Essential (primary) hypertension: Secondary | ICD-10-CM

## 2024-07-05 DIAGNOSIS — F439 Reaction to severe stress, unspecified: Secondary | ICD-10-CM

## 2024-07-05 DIAGNOSIS — R7301 Impaired fasting glucose: Secondary | ICD-10-CM

## 2024-07-05 DIAGNOSIS — R7989 Other specified abnormal findings of blood chemistry: Secondary | ICD-10-CM

## 2024-07-05 DIAGNOSIS — L720 Epidermal cyst: Secondary | ICD-10-CM

## 2024-07-05 DIAGNOSIS — E039 Hypothyroidism, unspecified: Secondary | ICD-10-CM

## 2024-07-05 DIAGNOSIS — E870 Hyperosmolality and hypernatremia: Secondary | ICD-10-CM

## 2024-07-05 MED ORDER — LEVOTHYROXINE SODIUM 50 MCG PO TABS: 50.0000 ug | ORAL_TABLET | Freq: Every day | ORAL | 1 refills | Status: AC

## 2024-07-05 MED ORDER — FUROSEMIDE 20 MG PO TABS: 20.0000 mg | ORAL_TABLET | Freq: Every day | ORAL | 1 refills | Status: AC | PRN

## 2024-07-05 NOTE — Assessment & Plan Note (Signed)
 Due to recheck TSH.

## 2024-07-05 NOTE — Progress Notes (Signed)
 Established Patient Office Visit  Patient ID: Kathleen [name], female    DOB: 1941-07-04  Age: 83 y.o. MRN: [id number] PCP: Alvan Dorothyann BIRCH, MD  Chief Complaint  Patient presents with   Hypertension    Subjective:     HPI  Discussed the use of AI scribe software for clinical note transcription with the patient, who gave verbal consent to proceed.  History of Present Illness Kathleen [name] is an 83 year old female who presents for medication management and follow-up on her husband's condition.  Medication management - Running low on diuretic and levothyroxine  (Synthroid ) for thyroid  management - Takes atorvastatin  for hyperlipidemia - Pharmacy supply issues have caused delays in medication refills  Caregiver concerns - Husband experiencing significant right arm pain progressing to hand numbness, impairing daily activities - Husband is right-handed, increasing functional impact - Husband received a recent shoulder injection with some relief - Husband had shoulder x-rays; surgery not recommended due to age and cardiac comorbidities - Husband has aspirin  allergy causing respiratory symptoms, limiting use of NSAIDs including Voltaren gel due to potential cross-reactivity  No working heat in her home.    Cyst behind her ear has been leaking.  She says that she did go to dermatology previously and they had recommended that she not have it removed.  She gets occasional headaches and just wants to make sure that it is not causing her triggering the headaches    ROS    Objective:     BP 132/80   Pulse 83   Ht 5' (1.524 m)   Wt 124 lb 1.3 oz (56.3 kg)   SpO2 99%   BMI 24.23 kg/m    Physical Exam Vitals and nursing note reviewed.  Constitutional:      Appearance: Normal appearance.  HENT:     Head: Normocephalic and atraumatic.  Eyes:     Conjunctiva/sclera: Conjunctivae normal.  Cardiovascular:     Rate and Rhythm: Normal rate and regular rhythm.  Pulmonary:      Effort: Pulmonary effort is normal.     Breath sounds: Normal breath sounds.  Skin:    General: Skin is warm and dry.     Comments: Epidermal cyst on her scalp behind the left ear.  Neurological:     Mental Status: She is alert.  Psychiatric:        Mood and Affect: Mood normal.      No results found for any visits on 07/05/24.    The ASCVD Risk score (Arnett DK, et al., 2019) failed to calculate for the following reasons:   The 2019 ASCVD risk score is only valid for ages 33 to 73   Risk score cannot be calculated because patient has a medical history suggesting prior/existing ASCVD   * - Cholesterol units were assumed    Assessment & Plan:   Problem List Items Addressed This Visit       Cardiovascular and Mediastinum   Essential hypertension - Primary   Well controlled. Continue current regimen. Follow up in  6 mo       Relevant Medications   furosemide  (LASIX ) 20 MG tablet   Other Relevant Orders   CMP14+EGFR   Hemoglobin A1c     Endocrine   Impaired fasting glucose   Due for updated A1C       Hypothyroidism   Due to recheck TSH.       Relevant Medications   levothyroxine  (SYNTHROID ) 50 MCG tablet   Other Relevant Orders  CMP14+EGFR   Hemoglobin A1c     Other   Stress at home   Still no working heat.       Other Visit Diagnoses       Hypernatremia       Relevant Orders   CMP14+EGFR   Hemoglobin A1c     Elevated serum creatinine       Relevant Orders   CMP14+EGFR   Hemoglobin A1c     Epidermal cyst           Assessment and Plan Assessment & Plan Hypothyroidism She reported running low on medication. - Sent Synthroid  prescription to [facility].  General Health Maintenance Discussed medication management, pharmacy options, and potential NSAID cross-reactivity due to aspirin  allergy. - Sent fluid pill prescription to [facility]. - Consider switching to pharmacy for medication management.  Epidermal cyst-gave  reassurance it does not look inflamed or irritated and is not actively draining.  This can be removed per her preference but if it is not bothersome okay to monitor as well.  Reassured her that it is not causing headaches I suspect the headaches are probably coming from tension and cervical issues.  Return in about 4 months (around 11/03/2024).    Dorothyann Byars, MD [facility]

## 2024-07-05 NOTE — Assessment & Plan Note (Signed)
 Still no working heat.

## 2024-07-05 NOTE — Assessment & Plan Note (Signed)
 Well controlled. Continue current regimen. Follow up in  6 mo

## 2024-07-05 NOTE — Assessment & Plan Note (Signed)
 Due for updated A1C.

## 2024-07-06 ENCOUNTER — Ambulatory Visit: Payer: Self-pay | Admitting: Family Medicine

## 2024-07-06 LAB — CMP14+EGFR
ALT: 15 IU/L (ref 0–32)
AST: 23 IU/L (ref 0–40)
Albumin: 4.3 g/dL (ref 3.7–4.7)
Alkaline Phosphatase: 83 IU/L (ref 48–129)
BUN/Creatinine Ratio: 24 (ref 12–28)
BUN: 24 mg/dL (ref 8–27)
Bilirubin Total: 0.5 mg/dL (ref 0.0–1.2)
CO2: 24 mmol/L (ref 20–29)
Calcium: 10 mg/dL (ref 8.7–10.3)
Chloride: 105 mmol/L (ref 96–106)
Creatinine, Ser: 0.99 mg/dL (ref 0.57–1.00)
Globulin, Total: 2.2 g/dL (ref 1.5–4.5)
Glucose: 89 mg/dL (ref 70–99)
Potassium: 4.2 mmol/L (ref 3.5–5.2)
Sodium: 142 mmol/L (ref 134–144)
Total Protein: 6.5 g/dL (ref 6.0–8.5)
eGFR: 56 mL/min/1.73 — ABNORMAL LOW (ref >59)

## 2024-07-06 LAB — HEMOGLOBIN A1C
Est. average glucose Bld gHb Est-mCnc: 131 mg/dL
Hgb A1c MFr Bld: 6.2 % — ABNORMAL HIGH (ref 4.8–5.6)

## 2024-07-06 NOTE — Progress Notes (Signed)
 Your lab work is within acceptable range and there are no concerning findings.   ?

## 2024-07-07 NOTE — Progress Notes (Signed)
Attempted call to patient . Left voice mail message requesting a return call. Also noted on message that pt could check Mychart for results as well.   

## 2024-07-22 ENCOUNTER — Other Ambulatory Visit: Payer: Self-pay | Admitting: Family Medicine

## 2024-07-22 DIAGNOSIS — Z8673 Personal history of transient ischemic attack (TIA), and cerebral infarction without residual deficits: Secondary | ICD-10-CM

## 2024-08-29 ENCOUNTER — Ambulatory Visit: Admitting: Cardiovascular Disease

## 2024-09-12 ENCOUNTER — Ambulatory Visit: Admitting: Cardiovascular Disease

## 2024-11-03 ENCOUNTER — Ambulatory Visit: Admitting: Family Medicine

## 2024-11-03 ENCOUNTER — Ambulatory Visit
